# Patient Record
Sex: Female | Born: 1935 | Race: White | Hispanic: No
Health system: Southern US, Community
[De-identification: ages and names within clinical notes are randomized; demographics above are authoritative.]

## PROBLEM LIST (undated history)

## (undated) DIAGNOSIS — I4891 Unspecified atrial fibrillation: Secondary | ICD-10-CM

## (undated) DIAGNOSIS — I059 Rheumatic mitral valve disease, unspecified: Secondary | ICD-10-CM

## (undated) DIAGNOSIS — J309 Allergic rhinitis, unspecified: Secondary | ICD-10-CM

## (undated) DIAGNOSIS — H269 Unspecified cataract: Secondary | ICD-10-CM

## (undated) DIAGNOSIS — F411 Generalized anxiety disorder: Secondary | ICD-10-CM

## (undated) DIAGNOSIS — L719 Rosacea, unspecified: Secondary | ICD-10-CM

## (undated) DIAGNOSIS — G609 Hereditary and idiopathic neuropathy, unspecified: Secondary | ICD-10-CM

## (undated) DIAGNOSIS — H8110 Benign paroxysmal vertigo, unspecified ear: Secondary | ICD-10-CM

## (undated) DIAGNOSIS — M549 Dorsalgia, unspecified: Secondary | ICD-10-CM

## (undated) DIAGNOSIS — M810 Age-related osteoporosis without current pathological fracture: Secondary | ICD-10-CM

## (undated) DIAGNOSIS — F329 Major depressive disorder, single episode, unspecified: Secondary | ICD-10-CM

## (undated) DIAGNOSIS — F3289 Other specified depressive episodes: Secondary | ICD-10-CM

## (undated) DIAGNOSIS — I6529 Occlusion and stenosis of unspecified carotid artery: Secondary | ICD-10-CM

## (undated) DIAGNOSIS — H409 Unspecified glaucoma: Secondary | ICD-10-CM

## (undated) DIAGNOSIS — I10 Essential (primary) hypertension: Secondary | ICD-10-CM

## (undated) DIAGNOSIS — J45909 Unspecified asthma, uncomplicated: Secondary | ICD-10-CM

## (undated) HISTORY — DX: Age-related osteoporosis without current pathological fracture: M81.0

## (undated) HISTORY — DX: Benign paroxysmal vertigo, unspecified ear: H81.10

## (undated) HISTORY — DX: Unspecified cataract: H26.9

## (undated) HISTORY — DX: Unspecified glaucoma: H40.9

## (undated) HISTORY — DX: Rosacea, unspecified: L71.9

## (undated) HISTORY — DX: Unspecified atrial fibrillation: I48.91

## (undated) HISTORY — DX: Occlusion and stenosis of unspecified carotid artery: I65.29

## (undated) HISTORY — DX: Dorsalgia, unspecified: M54.9

## (undated) HISTORY — DX: Rheumatic mitral valve disease, unspecified: I05.9

## (undated) HISTORY — DX: Essential (primary) hypertension: I10

## (undated) HISTORY — PX: CATARACT EXTRACTION, BILATERAL: SHX1313

## (undated) HISTORY — DX: Other specified depressive episodes: F32.89

## (undated) HISTORY — DX: Major depressive disorder, single episode, unspecified: F32.9

## (undated) HISTORY — DX: Hereditary and idiopathic neuropathy, unspecified: G60.9

## (undated) HISTORY — DX: Generalized anxiety disorder: F41.1

## (undated) HISTORY — DX: Hypercalcemia: E83.52

## (undated) HISTORY — PX: HEMORRHOID SURGERY: SHX153

## (undated) HISTORY — DX: Unspecified asthma, uncomplicated: J45.909

## (undated) HISTORY — DX: Allergic rhinitis, unspecified: J30.9

## (undated) HISTORY — PX: OTHER SURGICAL HISTORY: SHX169

---

## 1997-06-02 ENCOUNTER — Emergency Department (HOSPITAL_COMMUNITY): Admission: EM | Admit: 1997-06-02 | Discharge: 1997-06-02 | Payer: Self-pay | Admitting: Emergency Medicine

## 1998-09-17 ENCOUNTER — Other Ambulatory Visit: Admission: RE | Admit: 1998-09-17 | Discharge: 1998-09-17 | Payer: Self-pay | Admitting: Gynecology

## 1999-09-19 ENCOUNTER — Other Ambulatory Visit: Admission: RE | Admit: 1999-09-19 | Discharge: 1999-09-19 | Payer: Self-pay | Admitting: Gynecology

## 1999-09-19 ENCOUNTER — Encounter: Admission: RE | Admit: 1999-09-19 | Discharge: 1999-09-19 | Payer: Self-pay | Admitting: Gynecology

## 1999-09-19 ENCOUNTER — Encounter: Payer: Self-pay | Admitting: Gynecology

## 2000-03-09 ENCOUNTER — Encounter: Payer: Self-pay | Admitting: Ophthalmology

## 2000-03-10 ENCOUNTER — Ambulatory Visit (HOSPITAL_COMMUNITY): Admission: RE | Admit: 2000-03-10 | Discharge: 2000-03-10 | Payer: Self-pay | Admitting: Ophthalmology

## 2000-05-18 ENCOUNTER — Ambulatory Visit (HOSPITAL_BASED_OUTPATIENT_CLINIC_OR_DEPARTMENT_OTHER): Admission: RE | Admit: 2000-05-18 | Discharge: 2000-05-19 | Payer: Self-pay | Admitting: Orthopedic Surgery

## 2000-10-06 ENCOUNTER — Encounter: Admission: RE | Admit: 2000-10-06 | Discharge: 2000-10-06 | Payer: Self-pay | Admitting: Orthopedic Surgery

## 2000-10-06 ENCOUNTER — Encounter: Payer: Self-pay | Admitting: Orthopedic Surgery

## 2000-10-08 ENCOUNTER — Other Ambulatory Visit: Admission: RE | Admit: 2000-10-08 | Discharge: 2000-10-08 | Payer: Self-pay | Admitting: Gynecology

## 2000-10-09 ENCOUNTER — Encounter: Admission: RE | Admit: 2000-10-09 | Discharge: 2000-10-09 | Payer: Self-pay | Admitting: Gynecology

## 2000-10-09 ENCOUNTER — Encounter: Payer: Self-pay | Admitting: Gynecology

## 2000-12-30 ENCOUNTER — Ambulatory Visit (HOSPITAL_COMMUNITY): Admission: RE | Admit: 2000-12-30 | Discharge: 2000-12-30 | Payer: Self-pay | Admitting: Specialist

## 2001-03-22 ENCOUNTER — Inpatient Hospital Stay (HOSPITAL_COMMUNITY): Admission: EM | Admit: 2001-03-22 | Discharge: 2001-03-23 | Payer: Self-pay | Admitting: Emergency Medicine

## 2001-03-22 ENCOUNTER — Encounter: Payer: Self-pay | Admitting: Emergency Medicine

## 2001-03-23 ENCOUNTER — Encounter: Payer: Self-pay | Admitting: Endocrinology

## 2001-11-11 ENCOUNTER — Other Ambulatory Visit: Admission: RE | Admit: 2001-11-11 | Discharge: 2001-11-11 | Payer: Self-pay | Admitting: Gynecology

## 2001-11-11 ENCOUNTER — Encounter: Payer: Self-pay | Admitting: Gynecology

## 2001-11-11 ENCOUNTER — Encounter: Admission: RE | Admit: 2001-11-11 | Discharge: 2001-11-11 | Payer: Self-pay | Admitting: Gynecology

## 2002-08-19 ENCOUNTER — Encounter: Payer: Self-pay | Admitting: Internal Medicine

## 2002-08-19 ENCOUNTER — Ambulatory Visit (HOSPITAL_COMMUNITY): Admission: RE | Admit: 2002-08-19 | Discharge: 2002-08-19 | Payer: Self-pay | Admitting: Internal Medicine

## 2002-11-14 ENCOUNTER — Encounter: Admission: RE | Admit: 2002-11-14 | Discharge: 2002-11-14 | Payer: Self-pay | Admitting: Gynecology

## 2002-11-14 ENCOUNTER — Encounter: Payer: Self-pay | Admitting: Gynecology

## 2003-03-28 ENCOUNTER — Encounter: Admission: RE | Admit: 2003-03-28 | Discharge: 2003-03-28 | Payer: Self-pay | Admitting: Internal Medicine

## 2003-04-27 ENCOUNTER — Encounter: Payer: Self-pay | Admitting: Internal Medicine

## 2003-11-20 ENCOUNTER — Encounter: Admission: RE | Admit: 2003-11-20 | Discharge: 2003-11-20 | Payer: Self-pay | Admitting: Gynecology

## 2003-11-21 ENCOUNTER — Other Ambulatory Visit: Admission: RE | Admit: 2003-11-21 | Discharge: 2003-11-21 | Payer: Self-pay | Admitting: Gynecology

## 2004-04-11 ENCOUNTER — Ambulatory Visit: Payer: Self-pay | Admitting: Internal Medicine

## 2004-08-15 ENCOUNTER — Ambulatory Visit: Payer: Self-pay | Admitting: Internal Medicine

## 2004-11-25 ENCOUNTER — Encounter: Admission: RE | Admit: 2004-11-25 | Discharge: 2004-11-25 | Payer: Self-pay | Admitting: Gynecology

## 2004-12-20 ENCOUNTER — Ambulatory Visit: Payer: Self-pay | Admitting: Internal Medicine

## 2005-03-11 ENCOUNTER — Ambulatory Visit: Payer: Self-pay | Admitting: Internal Medicine

## 2005-03-18 ENCOUNTER — Ambulatory Visit: Payer: Self-pay | Admitting: Internal Medicine

## 2005-05-07 ENCOUNTER — Ambulatory Visit: Payer: Self-pay | Admitting: Internal Medicine

## 2005-08-12 ENCOUNTER — Ambulatory Visit: Payer: Self-pay | Admitting: Internal Medicine

## 2005-09-18 ENCOUNTER — Ambulatory Visit: Payer: Self-pay | Admitting: Internal Medicine

## 2005-12-16 ENCOUNTER — Ambulatory Visit: Payer: Self-pay | Admitting: Internal Medicine

## 2005-12-17 ENCOUNTER — Encounter: Admission: RE | Admit: 2005-12-17 | Discharge: 2005-12-17 | Payer: Self-pay | Admitting: Gynecology

## 2005-12-17 ENCOUNTER — Other Ambulatory Visit: Admission: RE | Admit: 2005-12-17 | Discharge: 2005-12-17 | Payer: Self-pay | Admitting: Gynecology

## 2005-12-30 ENCOUNTER — Ambulatory Visit: Payer: Self-pay | Admitting: Internal Medicine

## 2006-01-19 ENCOUNTER — Ambulatory Visit: Payer: Self-pay

## 2006-01-19 ENCOUNTER — Encounter: Payer: Self-pay | Admitting: Cardiology

## 2006-03-04 ENCOUNTER — Ambulatory Visit: Payer: Self-pay | Admitting: Internal Medicine

## 2006-03-04 LAB — CONVERTED CEMR LAB
Basophils Relative: 1.1 % — ABNORMAL HIGH (ref 0.0–1.0)
Calcium: 10.2 mg/dL (ref 8.4–10.5)
Chloride: 102 meq/L (ref 96–112)
Cholesterol: 163 mg/dL (ref 0–200)
Glucose, Bld: 96 mg/dL (ref 70–99)
LDL Cholesterol: 60 mg/dL (ref 0–99)
MCHC: 33.3 g/dL (ref 30.0–36.0)
MCV: 96.7 fL (ref 78.0–100.0)
Monocytes Absolute: 0.4 10*3/uL (ref 0.2–0.7)
Monocytes Relative: 10.1 % (ref 3.0–11.0)
Neutro Abs: 2 10*3/uL (ref 1.4–7.7)
Neutrophils Relative %: 55.8 % (ref 43.0–77.0)
Platelets: 180 10*3/uL (ref 150–400)

## 2006-10-15 ENCOUNTER — Encounter: Payer: Self-pay | Admitting: Internal Medicine

## 2006-10-15 DIAGNOSIS — H409 Unspecified glaucoma: Secondary | ICD-10-CM

## 2006-10-15 DIAGNOSIS — H269 Unspecified cataract: Secondary | ICD-10-CM

## 2006-10-15 DIAGNOSIS — I059 Rheumatic mitral valve disease, unspecified: Secondary | ICD-10-CM

## 2006-10-15 DIAGNOSIS — J45909 Unspecified asthma, uncomplicated: Secondary | ICD-10-CM

## 2006-10-15 DIAGNOSIS — L719 Rosacea, unspecified: Secondary | ICD-10-CM

## 2006-10-15 DIAGNOSIS — M810 Age-related osteoporosis without current pathological fracture: Secondary | ICD-10-CM

## 2006-10-18 DIAGNOSIS — F329 Major depressive disorder, single episode, unspecified: Secondary | ICD-10-CM

## 2006-10-18 DIAGNOSIS — F411 Generalized anxiety disorder: Secondary | ICD-10-CM

## 2006-10-18 DIAGNOSIS — E785 Hyperlipidemia, unspecified: Secondary | ICD-10-CM

## 2006-12-16 ENCOUNTER — Ambulatory Visit: Payer: Self-pay | Admitting: Internal Medicine

## 2006-12-22 ENCOUNTER — Encounter: Admission: RE | Admit: 2006-12-22 | Discharge: 2006-12-22 | Payer: Self-pay | Admitting: Gynecology

## 2007-02-24 ENCOUNTER — Emergency Department (HOSPITAL_COMMUNITY): Admission: EM | Admit: 2007-02-24 | Discharge: 2007-02-24 | Payer: Self-pay | Admitting: Emergency Medicine

## 2007-06-01 ENCOUNTER — Ambulatory Visit: Payer: Self-pay | Admitting: Internal Medicine

## 2007-06-01 DIAGNOSIS — R350 Frequency of micturition: Secondary | ICD-10-CM

## 2007-06-01 DIAGNOSIS — M792 Neuralgia and neuritis, unspecified: Secondary | ICD-10-CM

## 2007-06-01 DIAGNOSIS — M25519 Pain in unspecified shoulder: Secondary | ICD-10-CM | POA: Insufficient documentation

## 2007-06-01 DIAGNOSIS — R5383 Other fatigue: Secondary | ICD-10-CM

## 2007-06-01 DIAGNOSIS — R5381 Other malaise: Secondary | ICD-10-CM | POA: Insufficient documentation

## 2007-06-01 DIAGNOSIS — J309 Allergic rhinitis, unspecified: Secondary | ICD-10-CM | POA: Insufficient documentation

## 2007-06-02 LAB — CONVERTED CEMR LAB
Albumin: 3.9 g/dL (ref 3.5–5.2)
Alkaline Phosphatase: 58 units/L (ref 39–117)
Bilirubin Urine: NEGATIVE
CO2: 31 meq/L (ref 19–32)
Calcium: 10.4 mg/dL (ref 8.4–10.5)
Eosinophils Relative: 4 % (ref 0.0–5.0)
GFR calc non Af Amer: 86 mL/min
HDL: 89.4 mg/dL (ref >39.0)
Ketones, ur: NEGATIVE mg/dL
LDL Cholesterol: 77 mg/dL (ref 0–99)
MCHC: 32.5 g/dL (ref 30.0–36.0)
MCV: 96.6 fL (ref 78.0–100.0)
Monocytes Relative: 10 % (ref 3.0–12.0)
Mucus, UA: NEGATIVE
RBC: 3.96 M/uL (ref 3.87–5.11)
RDW: 13.1 % (ref 11.5–14.6)
Sodium: 141 meq/L (ref 135–145)
Total Bilirubin: 0.6 mg/dL (ref 0.3–1.2)
Total CHOL/HDL Ratio: 2
Total Protein, Urine: NEGATIVE mg/dL
Total Protein: 7.5 g/dL (ref 6.0–8.3)
Triglycerides: 73 mg/dL (ref 0–149)
Urine Glucose: NEGATIVE mg/dL
VLDL: 15 mg/dL (ref 0–40)
pH: 6.5 (ref 5.0–8.0)

## 2007-06-10 ENCOUNTER — Telehealth: Payer: Self-pay | Admitting: Internal Medicine

## 2007-06-14 ENCOUNTER — Telehealth (INDEPENDENT_AMBULATORY_CARE_PROVIDER_SITE_OTHER): Payer: Self-pay | Admitting: *Deleted

## 2007-07-08 ENCOUNTER — Encounter: Payer: Self-pay | Admitting: Internal Medicine

## 2007-09-03 ENCOUNTER — Encounter: Admission: RE | Admit: 2007-09-03 | Discharge: 2007-09-03 | Payer: Self-pay | Admitting: Specialist

## 2007-12-06 ENCOUNTER — Ambulatory Visit: Payer: Self-pay | Admitting: Internal Medicine

## 2007-12-27 ENCOUNTER — Encounter: Payer: Self-pay | Admitting: Internal Medicine

## 2007-12-28 ENCOUNTER — Encounter: Admission: RE | Admit: 2007-12-28 | Discharge: 2007-12-28 | Payer: Self-pay | Admitting: Gynecology

## 2008-03-15 ENCOUNTER — Encounter: Payer: Self-pay | Admitting: Internal Medicine

## 2008-04-03 ENCOUNTER — Ambulatory Visit: Payer: Self-pay | Admitting: Internal Medicine

## 2008-04-03 DIAGNOSIS — J069 Acute upper respiratory infection, unspecified: Secondary | ICD-10-CM | POA: Insufficient documentation

## 2008-04-12 ENCOUNTER — Encounter: Payer: Self-pay | Admitting: Internal Medicine

## 2008-04-20 ENCOUNTER — Telehealth: Payer: Self-pay | Admitting: Internal Medicine

## 2008-05-03 ENCOUNTER — Encounter: Admission: RE | Admit: 2008-05-03 | Discharge: 2008-05-03 | Payer: Self-pay | Admitting: Urology

## 2008-05-08 ENCOUNTER — Ambulatory Visit (HOSPITAL_BASED_OUTPATIENT_CLINIC_OR_DEPARTMENT_OTHER): Admission: RE | Admit: 2008-05-08 | Discharge: 2008-05-08 | Payer: Self-pay | Admitting: Urology

## 2008-05-15 ENCOUNTER — Encounter: Payer: Self-pay | Admitting: Internal Medicine

## 2008-06-07 ENCOUNTER — Encounter: Payer: Self-pay | Admitting: Internal Medicine

## 2008-09-06 DIAGNOSIS — K644 Residual hemorrhoidal skin tags: Secondary | ICD-10-CM | POA: Insufficient documentation

## 2008-09-12 ENCOUNTER — Ambulatory Visit: Payer: Self-pay | Admitting: Internal Medicine

## 2008-09-19 ENCOUNTER — Ambulatory Visit: Payer: Self-pay | Admitting: Internal Medicine

## 2008-09-26 ENCOUNTER — Telehealth: Payer: Self-pay | Admitting: Internal Medicine

## 2008-11-09 ENCOUNTER — Ambulatory Visit: Payer: Self-pay | Admitting: Internal Medicine

## 2008-11-09 DIAGNOSIS — H8110 Benign paroxysmal vertigo, unspecified ear: Secondary | ICD-10-CM

## 2008-11-09 DIAGNOSIS — M549 Dorsalgia, unspecified: Secondary | ICD-10-CM | POA: Insufficient documentation

## 2008-11-10 ENCOUNTER — Ambulatory Visit: Payer: Self-pay | Admitting: Internal Medicine

## 2008-11-10 LAB — CONVERTED CEMR LAB
Alkaline Phosphatase: 52 units/L (ref 39–117)
BUN: 14 mg/dL (ref 6–23)
Basophils Relative: 0.9 % (ref 0.0–3.0)
Bilirubin, Direct: 0 mg/dL (ref 0.0–0.3)
Cholesterol: 180 mg/dL (ref 0–200)
Creatinine, Ser: 0.8 mg/dL (ref 0.4–1.2)
Eosinophils Relative: 2.4 % (ref 0.0–5.0)
GFR calc non Af Amer: 73.23 mL/min (ref >60)
HDL: 82.2 mg/dL (ref >39.00)
Hemoglobin, Urine: NEGATIVE
LDL Cholesterol: 81 mg/dL (ref 0–99)
Leukocytes, UA: NEGATIVE
Lymphs Abs: 1 10*3/uL (ref 0.7–4.0)
MCV: 97.4 fL (ref 78.0–100.0)
Neutro Abs: 2 10*3/uL (ref 1.4–7.7)
Potassium: 3.9 meq/L (ref 3.5–5.1)
TSH: 0.28 microintl units/mL — ABNORMAL LOW (ref 0.35–5.50)
Total CHOL/HDL Ratio: 2
Triglycerides: 84 mg/dL (ref 0.0–149.0)
VLDL: 16.8 mg/dL (ref 0.0–40.0)
WBC: 3.5 10*3/uL — ABNORMAL LOW (ref 4.5–10.5)

## 2008-12-05 ENCOUNTER — Ambulatory Visit: Payer: Self-pay | Admitting: Internal Medicine

## 2008-12-28 ENCOUNTER — Encounter: Payer: Self-pay | Admitting: Internal Medicine

## 2008-12-29 ENCOUNTER — Encounter: Admission: RE | Admit: 2008-12-29 | Discharge: 2008-12-29 | Payer: Self-pay | Admitting: Gynecology

## 2008-12-29 LAB — HM MAMMOGRAPHY: HM Mammogram: NEGATIVE

## 2009-02-01 ENCOUNTER — Telehealth: Payer: Self-pay | Admitting: Internal Medicine

## 2009-02-07 ENCOUNTER — Encounter: Payer: Self-pay | Admitting: Internal Medicine

## 2009-12-31 ENCOUNTER — Encounter: Admission: RE | Admit: 2009-12-31 | Discharge: 2009-12-31 | Payer: Self-pay | Admitting: Gynecology

## 2010-01-01 ENCOUNTER — Encounter: Payer: Self-pay | Admitting: Internal Medicine

## 2010-01-29 ENCOUNTER — Encounter: Payer: Self-pay | Admitting: Internal Medicine

## 2010-01-29 ENCOUNTER — Ambulatory Visit: Payer: Self-pay | Admitting: Internal Medicine

## 2010-01-29 LAB — CONVERTED CEMR LAB
Basophils Absolute: 0 10*3/uL (ref 0.0–0.1)
Basophils Relative: 1.2 % (ref 0.0–3.0)
Bilirubin Urine: NEGATIVE
Bilirubin, Direct: 0.1 mg/dL (ref 0.0–0.3)
CO2: 32 meq/L (ref 19–32)
Calcium: 9.9 mg/dL (ref 8.4–10.5)
Creatinine, Ser: 0.7 mg/dL (ref 0.4–1.2)
Eosinophils Absolute: 0.1 10*3/uL (ref 0.0–0.7)
Eosinophils Relative: 3.1 % (ref 0.0–5.0)
GFR calc non Af Amer: 83.78 mL/min (ref >60.00)
HCT: 33.9 % — ABNORMAL LOW (ref 36.0–46.0)
HDL: 94.5 mg/dL (ref >39.00)
Hemoglobin, Urine: NEGATIVE
Hemoglobin: 11.5 g/dL — ABNORMAL LOW (ref 12.0–15.0)
LDL Cholesterol: 73 mg/dL (ref 0–99)
Lymphs Abs: 1.2 10*3/uL (ref 0.7–4.0)
MCHC: 34.1 g/dL (ref 30.0–36.0)
Neutro Abs: 1.7 10*3/uL (ref 1.4–7.7)
Neutrophils Relative %: 48.9 % (ref 43.0–77.0)
RDW: 13.9 % (ref 11.5–14.6)
Specific Gravity, Urine: 1.015 (ref 1.000–1.030)
TSH: 0.55 microintl units/mL (ref 0.35–5.50)
Total CHOL/HDL Ratio: 2
Total Protein, Urine: NEGATIVE mg/dL
WBC: 3.4 10*3/uL — ABNORMAL LOW (ref 4.5–10.5)
pH: 7 (ref 5.0–8.0)

## 2010-03-28 NOTE — Assessment & Plan Note (Signed)
Summary: PER PT OV--D/T---STC   Vital Signs:  Patient profile:   77 year old female Height:      63.5 inches Weight:      123.50 pounds BMI:     21.61 O2 Sat:      98 % on Room air Temp:     98.7 degrees F oral Pulse rate:   62 / minute BP sitting:   128 / 62  (left arm) Cuff size:   regular  Vitals Entered By: Zella Ball Ewing CMA Duncan Dull) (January 29, 2010 3:26 PM)  O2 Flow:  Room air  Preventive Care Screening  Colonoscopy:    Date:  09/07/2009    Next Due:  08/2014    Results:  normal   CC: followup/RE   Primary Care Provider:  Oliver Barre, MD  CC:  followup/RE.  History of Present Illness: here for f/u; overall doing well;  Pt denies CP, worsening sob, doe, wheezing, orthopnea, pnd, worsening LE edema, palps, dizziness or syncope  Pt denies new neuro symptoms such as headache, facial or extremity weakness .  Pt denies polydipsia, polyuria.   Overall good compliance with meds, trying to follow low chol  diet, wt stable, little excercise however .  No fever, wt loss, night sweats, loss of appetite or other constitutional symptoms  Denies worsening depressive symptoms, suicidal ideation, or panic, though has some midl anxiety.  Overall good compliance with meds, and good tolerability.  No other new compalints except for mild ongoing fatigue, without OSA symtpoms.    Preventive Screening-Counseling & Management      Drug Use:  no.    Problems Prior to Update: 1)  Hypercalcemia  (ICD-275.42) 2)  Back Pain  (ICD-724.5) 3)  Fatigue  (ICD-780.79) 4)  Positional Vertigo  (ICD-386.11) 5)  External Hemorrhoids  (ICD-455.3) 6)  Fh of Colon Cancer  (ICD-153.9) 7)  Upper Respiratory Infection, Acute  (ICD-465.9) 8)  Peripheral Neuropathy  (ICD-356.9) 9)  Allergic Rhinitis  (ICD-477.9) 10)  Fatigue  (ICD-780.79) 11)  Frequency, Urinary  (ICD-788.41) 12)  Shoulder Pain, Bilateral  (ICD-719.41) 13)  Glaucoma Nos  (ICD-365.9) 14)  Hyperlipidemia  (ICD-272.4) 15)  Cataract Nos   (ICD-366.9) 16)  Rosacea  (ICD-695.3) 17)  Depression  (ICD-311) 18)  Anxiety  (ICD-300.00) 19)  Mitral Valve Prolapse  (ICD-424.0) 20)  Osteoporosis  (ICD-733.00) 21)  Asthma  (ICD-493.90)  Medications Prior to Update: 1)  Actonel 150 Mg Tabs (Risedronate Sodium) .Marland Kitchen.. 1po Q Month 2)  Aspirin Adult Low Strength 81 Mg Tbec (Aspirin) .... Take 1 By Mouth Once Daily 3)  Lumigan 0.03 % Soln (Bimatoprost) .Marland Kitchen.. 1 Drop in Each Eye Once Daily 4)  Minocycline Hcl 50 Mg Tabs (Minocycline Hcl) .Marland Kitchen.. 1 Tablet By Mouth Once Daily As Needed 5)  Alprazolam 0.5 Mg Tabs (Alprazolam) .... 1/2 - 1 Tablet By Mouth As Needed For Sleep 6)  Zostavax 09811 Unt/0.23ml Solr (Zoster Vaccine Live) .... Use Asd Im X 1  Current Medications (verified): 1)  Actonel 150 Mg Tabs (Risedronate Sodium) .Marland Kitchen.. 1po Q Month 2)  Aspirin Adult Low Strength 81 Mg Tbec (Aspirin) .... Take 1 By Mouth Once Daily 3)  Lumigan 0.03 % Soln (Bimatoprost) .Marland Kitchen.. 1 Drop in Each Eye Once Daily 4)  Minocycline Hcl 50 Mg Tabs (Minocycline Hcl) .Marland Kitchen.. 1 Tablet By Mouth Once Daily As Needed 5)  Alprazolam 0.5 Mg Tabs (Alprazolam) .... 1/2 - 1 Tablet By Mouth As Needed For Sleep 6)  Zostavax 91478 Unt/0.87ml Solr (Zoster Vaccine  Live) .... Use Asd Im X 1 7)  Alphagan P 0.15 % Soln (Brimonidine Tartrate) .Marland Kitchen.. 1 Drop Every Night  Allergies (verified): No Known Drug Allergies  Past History:  Past Surgical History: Last updated: 10/15/2006 Cataract extraction- bilateral posterior chamber interocular lens implant Hemorrhoidectomy L Ankle Surgery  Social History: Last updated: 01/29/2010 Married no children Never Smoked Alcohol use-no retired [employer] Drug use-no  Risk Factors: Smoking Status: never (06/01/2007)  Past Medical History: Asthma Mitral Valve Prolapse Rosacea allergic conjunctivitis Osteoporosis Anxiety Depression Hx of Cataracts Hyperlipidemia Glaucoma Allergic rhinitis symptomatic PVC's Peripheral  neuropathy Kidney Stones  Social History: Married no children Never Smoked Alcohol use-no retired [employer] Drug use-no Drug Use:  no  Review of Systems       all otherwise negative per pt -    Physical Exam  General:  alert and well-developed.   Head:  normocephalic and atraumatic.   Eyes:  vision grossly intact, pupils equal, and pupils round.   Ears:  R ear normal and L ear normal.   Nose:  nasal dischargemucosal pallor and mucosal erythema.   Mouth:  pharyngeal erythema and fair dentition.   Neck:  supple and no masses.   Lungs:  normal respiratory effort and normal breath sounds.   Heart:  normal rate and regular rhythm.   Abdomen:  soft, non-tender, and normal bowel sounds.   Msk:  no joint tenderness and no joint swelling.   Extremities:  no edema, no erythema  Neurologic:  strength normal in all extremities and gait normal.   Skin:  no rashes and no suspicious lesions.   Psych:  slightly anxious.     Impression & Recommendations:  Problem # 1:  FATIGUE (ICD-780.79) exam benign, to check labs below; follow with expectant management  Orders: TLB-BMP (Basic Metabolic Panel-BMET) (80048-METABOL) TLB-CBC Platelet - w/Differential (85025-CBCD) TLB-Hepatic/Liver Function Pnl (80076-HEPATIC) TLB-TSH (Thyroid Stimulating Hormone) (84443-TSH)  Problem # 2:  HYPERLIPIDEMIA (ICD-272.4)  Orders: TLB-Lipid Panel (80061-LIPID)  Labs Reviewed: SGOT: 37 (11/09/2008)   SGPT: 26 (11/09/2008)   HDL:82.20 (11/09/2008), 89.4 (06/01/2007)  LDL:81 (11/09/2008), 77 (06/01/2007)  Chol:180 (11/09/2008), 181 (06/01/2007)  Trig:84.0 (11/09/2008), 73 (06/01/2007) stable overall by hx and exam, ok to continue meds/tx as is  , Pt to continue diet efforts, good med tolerance; to check labs - goal LDL less than 100  Problem # 3:  OSTEOPOROSIS (ICD-733.00)  Her updated medication list for this problem includes:    Actonel 150 Mg Tabs (Risedronate sodium) .Marland Kitchen... 1po q month stable  overall by hx and exam, ok to continue meds/tx as is - Continue all previous medications as before this visit , last dxa per GYN stable per pt  Problem # 4:  ANXIETY (ICD-300.00)  Her updated medication list for this problem includes:    Alprazolam 0.5 Mg Tabs (Alprazolam) .Marland Kitchen... 1/2 - 1 tablet by mouth as needed for sleep stable overall by hx and exam, ok to continue meds/tx as is   Complete Medication List: 1)  Actonel 150 Mg Tabs (Risedronate sodium) .Marland Kitchen.. 1po q month 2)  Aspirin Adult Low Strength 81 Mg Tbec (Aspirin) .... Take 1 by mouth once daily 3)  Lumigan 0.03 % Soln (Bimatoprost) .Marland Kitchen.. 1 drop in each eye once daily 4)  Minocycline Hcl 50 Mg Tabs (Minocycline hcl) .Marland Kitchen.. 1 tablet by mouth once daily as needed 5)  Alprazolam 0.5 Mg Tabs (Alprazolam) .... 1/2 - 1 tablet by mouth as needed for sleep 6)  Zostavax 16109 Unt/0.74ml Solr (Zoster  vaccine live) .... Use asd im x 1 7)  Alphagan P 0.15 % Soln (Brimonidine tartrate) .Marland Kitchen.. 1 drop every night  Other Orders: EKG w/ Interpretation (93000) Tdap => 77yrs IM (56213) Admin 1st Vaccine (08657) TLB-Udip ONLY (81003-UDIP)  Patient Instructions: 1)  you had the tetanus shot today 2)  Please go to the Lab in the basement for your blood and/or urine tests today 3)  Please call the number on the [facility] Card for results of your testing  4)  Continue all previous medications as before this visit 5)  Please schedule a follow-up appointment in 1 year, or sooner if needed Prescriptions: ACTONEL 150 MG TABS (RISEDRONATE SODIUM) 1po q month  #3 x 3   Entered and Authorized by:   Corwin Levins MD   Signed by:   Corwin Levins MD on 01/29/2010   Method used:   Print then Give to Patient   RxID:   [id number] ALPRAZOLAM 0.5 MG TABS (ALPRAZOLAM) 1/2 - 1 tablet by mouth as needed for sleep  #30 x 1   Entered and Authorized by:   Corwin Levins MD   Signed by:   Corwin Levins MD on 01/29/2010   Method used:   Print then Give to Patient   RxID:    [id number]    Orders Added: 1)  EKG w/ Interpretation [93000] 2)  Tdap => 13yrs IM [90715] 3)  Admin 1st Vaccine [90471] 4)  TLB-BMP (Basic Metabolic Panel-BMET) [80048-METABOL] 5)  TLB-CBC Platelet - w/Differential [85025-CBCD] 6)  TLB-Hepatic/Liver Function Pnl [80076-HEPATIC] 7)  TLB-TSH (Thyroid Stimulating Hormone) [84443-TSH] 8)  TLB-Lipid Panel [80061-LIPID] 9)  TLB-Udip ONLY [81003-UDIP] 10)  Est. Patient Level IV [42595]   Immunizations Administered:  Tetanus Vaccine:    Vaccine Type: Tdap    Site: right deltoid    Mfr: GlaxoSmithKline    Dose: 0.5 ml    Route: IM    Given by: Zella Ball Ewing CMA (AAMA)    Exp. Date: 12/14/2011    Lot #: [id number]    VIS given: 01/12/08 version given January 29, 2010.   Immunizations Administered:  Tetanus Vaccine:    Vaccine Type: Tdap    Site: right deltoid    Mfr: GlaxoSmithKline    Dose: 0.5 ml    Route: IM    Given by: Zella Ball Ewing CMA (AAMA)    Exp. Date: 12/14/2011    Lot #: [id number]    VIS given: 01/12/08 version given January 29, 2010.

## 2010-03-28 NOTE — Letter (Signed)
Summary: Beather Arbour MD  Beather Arbour MD   Imported By: Sherian Rein 01/07/2010 07:48:24  _____________________________________________________________________  External Attachment:    Type:   Image     Comment:   External Document

## 2010-06-06 LAB — BASIC METABOLIC PANEL
CO2: 27 mEq/L (ref 19–32)
Creatinine, Ser: 0.65 mg/dL (ref 0.4–1.2)
GFR calc Af Amer: >60 mL/min (ref >60)
GFR calc non Af Amer: >60 mL/min (ref >60)

## 2010-06-29 ENCOUNTER — Emergency Department (HOSPITAL_COMMUNITY): Payer: Medicare Other

## 2010-06-29 ENCOUNTER — Emergency Department (HOSPITAL_COMMUNITY)
Admission: EM | Admit: 2010-06-29 | Discharge: 2010-06-29 | Disposition: A | Discharge status: Home / Self Care | Payer: Medicare Other | Attending: Emergency Medicine | Admitting: Emergency Medicine

## 2010-06-29 DIAGNOSIS — H409 Unspecified glaucoma: Secondary | ICD-10-CM | POA: Insufficient documentation

## 2010-06-29 DIAGNOSIS — W010XXA Fall on same level from slipping, tripping and stumbling without subsequent striking against object, initial encounter: Secondary | ICD-10-CM | POA: Insufficient documentation

## 2010-06-29 DIAGNOSIS — J45909 Unspecified asthma, uncomplicated: Secondary | ICD-10-CM | POA: Insufficient documentation

## 2010-06-29 DIAGNOSIS — S42209A Unspecified fracture of upper end of unspecified humerus, initial encounter for closed fracture: Secondary | ICD-10-CM | POA: Insufficient documentation

## 2010-06-29 DIAGNOSIS — M25519 Pain in unspecified shoulder: Secondary | ICD-10-CM | POA: Insufficient documentation

## 2010-07-09 NOTE — Assessment & Plan Note (Signed)
[facility]                             PULMONARY OFFICE NOTE   Ann Booth, Ann Booth                     MRN:          [id number]  DATE:12/16/2006                            DOB:          May 09, 1926    PROBLEM:  1. Asthma.  2. Allergic conjunctivitis.  3. Rosacea.  4. Mitral valve prolapse.  5. Dry eyes/glaucoma.   HISTORY:  She has been off allergy vaccine for the past year as agreed.  Still feels comfortable.  She is please that she can still sweep the  floor without being bothered by house dust.  That had been a marker of  trouble for her when allergy vaccine was first begun.  Glaucoma drops  make her eyes red.  She does have some burning and dryness, and is  working with her eye doctors.  I suggested she ask permission to try  Lacri-Lube ointment.   She continues on:  1. Glaucoma eye drops.  2. Aspirin 81 mg.  3. Xanax p.r.n.  4. Vitamins.   NO MEDICATION ALLERGY.   OBJECTIVE:  Weight 128 pounds.  BP 124/64.  Pulse 58.  Room air  saturation 100%.  Quiet clear chest.  Normal heart sounds.  Conjunctivae are injected.   IMPRESSION:  Asthma and rhinitis are well controlled.  I think her  conjunctival injection is more of a reflection of her eye medications  than of allergic conjunctivitis.   PLAN:  She was given the name of Lacri-Lube ointment so she can speak to  her doctors about it.  Schedule return in 1 year, earlier p.r.n.  Continue routine environmental precautions against heavy exposure to  agents which have caused allergy symptoms in the past.     Clinton D. Maple Hudson, MD, Tonny Bollman, FACP  Electronically Signed    CDY/MedQ  DD: 12/16/2006  DT: 12/17/2006  Job #: [id number]

## 2010-07-09 NOTE — Op Note (Signed)
[name], Ann Booth NO.:  [id number]   MEDICAL RECORD NO.:  [id number]          PATIENT TYPE:  AMB   LOCATION:  [facility]                         FACILITY:  [facility]   PHYSICIAN:  Heloise Purpura, MD      DATE OF BIRTH:  1927/01/04   DATE OF PROCEDURE:  05/08/2008  DATE OF DISCHARGE:                               OPERATIVE REPORT   PREOPERATIVE DIAGNOSES:  1. Left renal calculi.  2. Recurrent urinary tract infections.   POSTOPERATIVE DIAGNOSES:  1. Left renal calculi.  2. Recurrent urinary tract infections.   PROCEDURE:  1. Cystoscopy.  2. Left retrograde pyelography.  3. Left ureteroscopy with laser lithotripsy.  4. Left ureteral stent placement (6 x 24).   SURGEON:  Heloise Purpura, M.D.   ANESTHESIA:  LMA.   INDICATION:  [name] is an 77 year old female with recurrent  Klebsiella urinary tract infections despite appropriate antibiotic  therapy.  She underwent an evaluation for persistent bacteriuria and was  found to have 2 large left renal calculi including what appeared to be  obstruction of her upper pole collecting system.  After discussion  regarding management options for treatment, she elected to proceed with  surgical therapy and the above procedures.  The potential risks,  complications, and alternative treatment options were discussed in  detail and informed consent was obtained.   DESCRIPTION OF PROCEDURE:  The patient was taken to the operating room  and anesthesia was induced.  She was given preoperative antibiotics,  placed in the dorsal lithotomy position, and prepped and draped in the  usual sterile fashion.  Next, a preoperative time-out was performed.  Cystourethroscopy was performed which demonstrated a normal urethra.  The bladder was systematically examined and there was no evidence for  any tumors, stones, or other mucosal pathology.  The ureteral orifices  were in the normal anatomic position and effluxing clear urine.  The  left  ureteral orifice was then identified and intubated with a 5-French  ureteral catheter.  Omnipaque contrast was injected which demonstrated a  normal caliber ureter without evidence of filling defects or  intraureteral calculi.  A 0.038 sensor guidewire was then advanced up  into the left renal pelvis under fluoroscopic guidance.  A 12/14 access  sheath was advanced over the wire into the proximal ureter.  The digital  flexible ureteroscope was then advanced up into the proximal ureter and  into the renal pelvis.  The renal collecting system was examined under  direct vision.  The patient was noted to have a lower pole renal  calculus as seen on her CT scan.  In addition, she was noted to have a  large upper pole calculus obstructing an upper pole infundibulum as  suspected on her CT scan.  The 200-micron holmium laser fiber was then  placed through the scope and laser lithotripsy was performed until all  stones were fragmented adequately and would be expected to pass  spontaneously.  Examination of the collecting system revealed no  remaining sizable fragments.  Therefore, a 0.038 sensor guidewire was  advanced back up the ureter into the renal pelvis  and this wire was back-  loaded on the cystoscope sheath.  A 6 x 24 double-J ureteral stent was  then advanced over the wire using Seldinger technique.  The wire was  removed with a good curl noted in the renal pelvis as well as in the  bladder.  The patient's bladder was then emptied and the procedure was  ended.  She tolerated the procedure well and without complications.  She  was able to be awakened and transferred to the recovery unit in  satisfactory condition.      Heloise Purpura, MD  Electronically Signed     LB/MEDQ  D:  05/08/2008  T:  05/08/2008  Job:  [id number]

## 2010-07-11 ENCOUNTER — Emergency Department (HOSPITAL_COMMUNITY): Payer: Medicare Other

## 2010-07-11 ENCOUNTER — Encounter (HOSPITAL_COMMUNITY): Payer: Self-pay | Admitting: Radiology

## 2010-07-11 ENCOUNTER — Emergency Department (HOSPITAL_COMMUNITY)
Admission: EM | Admit: 2010-07-11 | Discharge: 2010-07-11 | Disposition: A | Discharge status: Home / Self Care | Payer: Medicare Other | Attending: Emergency Medicine | Admitting: Emergency Medicine

## 2010-07-11 DIAGNOSIS — J45909 Unspecified asthma, uncomplicated: Secondary | ICD-10-CM | POA: Insufficient documentation

## 2010-07-11 DIAGNOSIS — Z79899 Other long term (current) drug therapy: Secondary | ICD-10-CM | POA: Insufficient documentation

## 2010-07-11 DIAGNOSIS — R071 Chest pain on breathing: Secondary | ICD-10-CM | POA: Insufficient documentation

## 2010-07-11 DIAGNOSIS — R0989 Other specified symptoms and signs involving the circulatory and respiratory systems: Secondary | ICD-10-CM | POA: Insufficient documentation

## 2010-07-11 DIAGNOSIS — R0609 Other forms of dyspnea: Secondary | ICD-10-CM | POA: Insufficient documentation

## 2010-07-11 DIAGNOSIS — H409 Unspecified glaucoma: Secondary | ICD-10-CM | POA: Insufficient documentation

## 2010-07-11 DIAGNOSIS — R0602 Shortness of breath: Secondary | ICD-10-CM | POA: Insufficient documentation

## 2010-07-11 LAB — CBC
MCV: 94.5 fL (ref 78.0–100.0)
Platelets: 263 10*3/uL (ref 150–400)
RBC: 3.48 MIL/uL — ABNORMAL LOW (ref 3.87–5.11)
RDW: 14.2 % (ref 11.5–15.5)

## 2010-07-11 LAB — BASIC METABOLIC PANEL
Calcium: 11.1 mg/dL — ABNORMAL HIGH (ref 8.4–10.5)
Chloride: 102 mEq/L (ref 96–112)
GFR calc Af Amer: >60 mL/min (ref >60)
GFR calc non Af Amer: >60 mL/min (ref >60)

## 2010-07-11 LAB — DIFFERENTIAL
Eosinophils Relative: 3 % (ref 0–5)
Lymphocytes Relative: 19 % (ref 12–46)
Lymphs Abs: 0.7 10*3/uL (ref 0.7–4.0)
Monocytes Relative: 9 % (ref 3–12)
Neutro Abs: 2.6 10*3/uL (ref 1.7–7.7)

## 2010-07-11 LAB — POCT CARDIAC MARKERS
Myoglobin, poc: 93.3 ng/mL (ref 12–200)
Troponin i, poc: <0.05 ng/mL (ref 0.00–0.09)

## 2010-07-11 MED ORDER — IOHEXOL 300 MG/ML  SOLN
90.0000 mL | Freq: Once | INTRAMUSCULAR | Status: AC | PRN
  Administered 2010-07-11: 90 mL via INTRAVENOUS

## 2010-07-12 NOTE — Op Note (Signed)
. [facility]  Patient:    Ann Booth, Ann Booth                     MRN: [id number] Proc. Date: 03/10/00 Adm. Date:  [id number] Attending:  Ivor Messier CC:         Corwin Levins, M.D. [facility]   Operative Report  PREOPERATIVE DIAGNOSIS:  Senile cataract, right eye.  POSTOPERATIVE DIAGNOSIS:  Senile cataract, right eye.  PROCEDURE:  Planned extracapsular cataract extraction - phacoemulsification, primary insertion of posterior chamber interocular lens implant.  SURGEON:  Guadelupe Sabin, M.D.  ASSISTANT:  Nurse.  ANESTHESIA:  Local 4% Xylocaine, 0.75% Marcaine retrobulbar block, topical Tetracaine, interocular Xylocaine.  Anesthesia standby required in this 77 year old patient.  The patient was given sodium Pentothal intravenously during the period of retrobulbar injection.  DESCRIPTION OF PROCEDURE:  After the patient was prepped and draped, a speculum was inserted in the right eye.  The eye was turned downward and a superior rectus traction suture placed.  Schiotz tonometry was recorded within normal limits scale unit 6 to 7.  A superior rectus traction suture was placed.  A peritomy was performed adjacent to the limbus from the 11 to 1 oclock position.  A corneoscleral junction was cleaned and a corneoscleral groove made with a 45 degree Superblade.  The anterior chamber was then entered with a diamond 2.5 mm Keratome at the 12 oclock position and the 15 degree blade at the 2:30 position.  Using a bent 26 gauge needle on a Helon syringe, a circular capsulorrhexis was begun and then completed with the Graebo forceps.  Hydrodissection and hydrodelineation were performed using 1% Xylocaine.  The 30 degree phacoemulsification tip was then inserted with slow controlled emulsification of the lens nucleus.  Total ultrasonic time 54 seconds, average power level 17%.  Total amount of fluid used during the emulsification 50 cc.  Following removal  of the nucleus, the residual cortex was aspirated with the irrigation aspiration tip.  The posterior capsule appeared intact with a brilliant red fundus reflex.  It was therefore elected to insert an [employer] [id number] silicon three-piece posterior chamber interocular lens implant with UV absorber, Diopter strength +22.50. This was inserted with the McDonald forceps into the anterior chamber and then centered into the capsular bag using the [facility] lens rotator.  The lens appeared to be well centered.  The Helon which had been used during the procedure was aspirated and replaced with balanced salt solution and Miochol ophthalmic solution.  The operative incisions appeared to be self sealing and no sutures were required.  Maxitrol ointment was instilled in the conjunctival cul-de-sac.  A light patch and protector shield were applied.  Duration of the procedure and anesthesia administration 45 minutes.  The patient tolerated the procedure well in general and left the operating room for the recovery room in good condition. DD:  03/10/00 TD:  03/10/00 Job: [id number]

## 2010-07-12 NOTE — Op Note (Signed)
[place]  Patient:    Ann Booth, Ann Booth                     MRN: [id number] Proc. Date: 05/18/00 Adm. Date:  [id number] Disc. Date: [id number] Attending:  Ivor Messier                           Operative Report  PREOPERATIVE DIAGNOSIS:  Left ankle bimalleolar fracture.  POSTOPERATIVE DIAGNOSIS:  Left ankle bimalleolar fracture.  OPERATIVE PROCEDURE:  Open reduction and internal fixation of left ankle bimalleolar fracture.  SURGEON:  Elana Alm. Thurston Hole, M.D.  ASSISTANT:  Julien Girt, P.A.  ANESTHESIA:  Spinal.  OPERATIVE TIME:  45 minutes.  COMPLICATIONS:  None.  INDICATIONS:  [name] is a 77 year old woman who fell five days ago sustaining a displaced bimalleolar ankle fracture and is now to undergo ORIF of this.  DESCRIPTION OF PROCEDURE:  [name] was brought to the operating room on May 18, 2000 and placed on the operative table in a supine position. After being placed under spinal anesthesia by Dr. Cipriano Mile of anesthesia, she was turned back supine. Her left foot and leg was prepped using sterile Betadine and draped using sterile technique. She received Ancef 1 g IV preoperatively for prophylaxis. Her leg was exsanguinated and a calf tourniquet elevated to 300 mmHg.  Initially through a 5-6 cm lateral distal fibular incision, initial exposure was made. The underlying subcutaneous tissues were incised in line with the skin incision. The peroneal tendons and seral nerve were carefully protected posteriorly while the fracture was subperiosteally exposed. The bone was found to be extremely soft and mildly comminuted at the fracture site and this was carefully held back in place while a 5 hole 1/3 tubular plate was placed along the posterolateral surface with the two most distal screw holes drilled, measured, tapped and the appropriate length 4 mm screws placed and the two most proximal screw holes drilled,  measured, tapped and the appropriate length 3.5 mm cortical screws placed. An interference lag screw could not be placed due to the softness of her bone. After this was done, AP and lateral fluoroscopic x-rays confirmed the anatomic reduction of this fibular fracture. At this point then a 4 cm curvilinear anteromedial incision was made over the medial malleolus. The underlying subcutaneous tissues were incised along with the skin incision. The saphenous vein was carefully protected while a longitudinal incision was made over the periosteum over the fracture site. The fracture was held up into a reduced position while two separate K wires from the cannulated 3.5 mm screw set were placed, one anterior and one posterior in the medial malleolar fragment going across the fracture site and into the distal tibial metaphysis. After these two guide pins were placed, they were measured and then overdrilled with 2.7 mm drills and then 3.5 mm cannulated screws of the appropriate length were placed with washers, thus holding the fracture in a reduced and anatomic position. This was confirmed by intraoperative fluoroscopy that the mortise was reduced to an anatomic position, that all the hardware is in excellent position, and the fractures were reduced anatomically.  The wounds were then irrigated and then closed using 2-0 Vicryl and skin staples. Sterile dressings and a short leg splint was applied. The tourniquet was released. The patient was then taken to the recovery room in stable condition.  FOLLOW-UP CARE:  Ms. Ziesmer will be followed overnight  in the recovery care center for IV pain control and neurovascular monitoring. Discharged tomorrow on Percocet for pain. See me back in the office in a week for sutures out and follow-up. DD:  05/18/00 TD:  05/18/00 Job: [id number]

## 2010-07-12 NOTE — H&P (Signed)
[place]  Patient:    Ann Booth, Ann Booth Visit Number: [id number] MRN: [id number]          Service Type: MED Location: 2000 2030 01 Attending Physician:  Justine Null Dictated by:   Justine Null, M.D. [facility] Admit Date:  03/22/2001 Discharge Date: 03/23/2001                           History and Physical  REASON FOR ADMISSION:  Chest pain.  HISTORY OF PRESENT ILLNESS:  The patient is a 77 year old woman with four hours of severe, sharp quality chest pain localized to the substernal area; there is associated nausea.  Came on while she was resting.  Relived with sublingual nitroglycerin in the emergency department.  PAST MEDICAL HISTORY: 1. Asthma. 2. Osteoporosis.  No history of hypertension, heart disease, cholesterol, nor diabetes.  MEDICATIONS: 1. Actonel. 2. Albuterol inhaler.  SOCIAL HISTORY:  Nonsmoker.  FAMILY HISTORY:  No heart disease except mother had heart disease at age 93.  REVIEW OF SYSTEMS:  Denies the following:  Vomiting, shortness of breath, excessive diaphoresis, fever, chills, headache, loss of consciousness, skin rash, melena, bright red blood per rectum, dysuria, and abdominal pain.  PHYSICAL EXAMINATION:  VITAL SIGNS:  Blood pressure 186/88, heart rate 61, respiratory rate 20, afebrile.  GENERAL:  No distress.  SKIN:  Not diaphoretic.  HEENT:  Head is atraumatic.  Sclerae not icteric.  Pharynx clear.  NECK:  Supple.  CHEST:  Clear to auscultation.  CARDIOVASCULAR:  No JVD, no edema.  Regular rate and rhythm, no murmur.  ABDOMEN:  Soft, nontender.  No hepatosplenomegaly, no mass.  BREAST, GYNECOLOGIC, RECTAL:  Not done at this time due to patient condition.  EXTREMITIES:  No obvious deformity of the joints.  NEUROLOGIC:  Alert, well-oriented, moves all fours.  Cranial nerves are grossly intact.  Patient is examined in supine position only.  LABORATORY DATA:  Chest x-ray and  electrocardiogram are normal.  IMPRESSION:  Chest pain of uncertain etiology.  PLAN: 1. Nitrates. 2. Check CPK and troponin. 3. Treadmill Cardiolite after rules out.Dictated by:   Justine Null, M.D. [facility] Attending Physician:  Justine Null DD:  03/22/01 TD:  03/22/01 Job: [id number]

## 2010-07-12 NOTE — Assessment & Plan Note (Signed)
[facility]                               PULMONARY OFFICE NOTE   Ann Booth, Ann Booth                     MRN:          [id number]  DATE:12/16/2005                            DOB:          03-02-1926    PRIMARY CARE PHYSICIAN:  Dr. Oliver Barre.   OPHTHALMOLOGIST:  Dr. Nelson Chimes.   PROBLEM LIST:  1. Asthma.  2. Allergic conjunctivitis.  3. Rosacea.  4. Mitral valve prolapse.  5. Dry eyes/glaucoma.   HISTORY:  Last here in January.  Comes now saying that she has had a little  bit of asthma in the summer, particularly when she had a cold about two  weeks ago which led to coughing.  She then was exposed to a series of  irritants including sitting next to a large display of flowers.  She says  after that she had persistent cough and throat irritation leading to  increased chest congestion yesterday.  We discussed her allergy vaccine  which is given by her husband at 1:10 with no problems.  She says before her  vaccine she could not sweep her floors because the dust would make her  wheeze and cough and now she can.  We discussed stopping the vaccine and she  is going to consider it.  In the meantime, we discussed risks and benefit  issues including decision about administration outside of the medical  office, anaphylaxis and epinephrine.  I gave her a prescription for an  epinephrine with discussion.  She has never had a significant reaction to  her vaccine.   MEDICATIONS:  Allergy vaccine, Travatan, minocycline use p.r.n., Actonel,  alprazolam 1/2 x25 mg p.r.n.   ALLERGIES:  No medication allergy.   OBJECTIVE:  Weight 128 pounds, BP 118/68, pulse regular 65, room air  saturation 99%.  She is a little puffy around the eyes without significant  injection.  Her nasal airway is unobstructed and there is no drainage or  pharyngeal erythema.  I hear a mild rattle in the posterior right upper lobe  that clears with deep breath.  Otherwise lungs  are quiet.  Heart sounds are  regular without murmur.   IMPRESSION:  Recent asthmatic bronchitis exacerbation I think reflecting a  viral infection and a series of irritant exposures.  She wants the treatment  that had worked for her in the past, nebulizer treatment and Depo-Medrol.  We discussed steroids and acute versus chronic management.  We have reviewed  allergy vaccine issues and she had signed our waiver as above.   PLAN:  1. She will decide whether she wants to continue allergy vaccine.  2. Nebulizer treatment here Xopenex 1.25 mg.  3. Depo-Medrol 80 mg IM with steroid talk.  4. Refilled albuterol 2 puffs p.r.n.  5. Flu vaccine.  6. Schedule return in one year and earlier p.r.n.       Clinton D. Maple Hudson, MD, FCCP, FACP      CDY/MedQ  DD:  12/16/2005  DT:  12/17/2005  Job #:  [id number]

## 2010-07-12 NOTE — H&P (Signed)
[place]  Patient:    Ann Booth, Ann Booth                     MRN: [id number] Adm. Date:  [id number] Attending:  Ivor Messier CC:         Corwin Levins, M.D. [facility]   History and Physical  This was a planned outpatient surgical admission of this 77 year old white female admitted for cataract implant surgery of the right eye.  HISTORY OF PRESENT ILLNESS:  This patient has noted blurring of vision and deterioration of vision over the past several years due to progressive cataract formation.  Recently vision was reduced to 20/100 in each eye and the patient has elected to proceed with cataract implant surgery.  The patient has also been followed in the office for borderline interocular pressure.  She currently, however, is taking no medications for this borderline glaucoma condition.  PAST MEDICAL HISTORY:  The patient is under the care of Dr. Jonny Ruiz and is said to be in satisfactory condition for the proposed surgery under local anesthesia.  REVIEW OF SYSTEMS:  No cardiorespiratory complaints.  PHYSICAL EXAMINATION:  VITAL SIGNS:  As recorded on admission; blood pressure 138/76, respirations 18, pulse 62, and temperature 98.  GENERAL:  The patient is a pleasant, well-nourished, well-developed, 77 year old white female in no acute distress.  HEENT:  Eyes; visual acuity as noted above.  Applanation tonometry; 28 mm each eye.  External ocular and slit lamp examination, the eyes are white and clear with nuclear cataract formation in both eyes.  Fundus examination reveals a clear vitreous, attached retina, with normal optic nerve, blood vessels, and macula.  CHEST:  Lungs clear to auscultation and percussion.  HEART:  Normal sinus rhythm.  No cardiomegaly and no murmurs.  ABDOMEN:  Negative.  EXTREMITIES:  Negative.  ADMISSION DIAGNOSES:  Senile cataract, both eyes.  PLAN:  Cataract implant surgery right eye now, left eye later. DD:   03/10/00 TD:  03/10/00 Job: [id number]

## 2010-07-12 NOTE — Discharge Summary (Signed)
[place]  Patient:    Ann Booth, Ann Booth Visit Number: [id number] MRN: [id number]          Service Type: MED Location: 2000 2030 01 Attending Physician:  Justine Null Dictated by:   Janora Norlander, N.P. Admit Date:  03/22/2001 Discharge Date: 03/23/2001   CC:         Corwin Levins, M.D. [facility]   Discharge Summary  DATE OF BIRTH:  24-Nov-1926  ADMITTING DIAGNOSIS:  Chest pain, rule out myocardial infarction.  DISCHARGE DIAGNOSIS:  Myocardial infarction, ruled out.  DISCHARGE MEDICATIONS: 1. Albuterol inhaler. 2. Actonel.  LABORATORY DATA AND X-RAY FINDINGS:  Cardiac enzymes and troponins were all negative x3.  CBC with WBC 43, H&H 12.3 and 35.8 respectively.  PT/INR 12.4 and 0.9 respectively, PTT 29.  Initial results of the Cardiolite exam on January 28, is negative for ischemia and ejection fraction of 65%  HISTORY OF PRESENT ILLNESS:  The patient was admitted on March 22, 2001, after stating that she had hours of severe sharp quality chest pain which localized in the substernal area and there was an association with nausea. She states that there was relief with sublingual nitroglycerin in the emergency room.  HOSPITAL COURSE:  She was subsequently worked up for cardiac source of her chest pain, all of which were negative.  The patient, upon reviewing her discharge instructions, states that she now feels that the chest pain was associated with an asthmatic attack that she was probably having.  DISCHARGE PHYSICAL EXAMINATION:  GENERAL:  The patient is awake, alert and oriented, ambulatory with no shortness of breath with no dyspnea on exertion.  HEART:  Regular rate and rhythm.  LUNGS:  Clear to auscultation.  There is no edema.  ABDOMEN:  Bowel sounds in all four quadrants.  NEUROLOGIC:  Cranial nerves II-XII intact bilaterally.  CONDITION ON DISCHARGE:  The patient is cleared for  discharge.  FOLLOWUP:  She is to follow up with Dr. Oliver Barre in two weeks. Dictated by:   Janora Norlander, N.P. Attending Physician:  Justine Null DD:  03/23/01 TD:  03/24/01 Job: [id number]

## 2010-07-25 ENCOUNTER — Encounter: Payer: Self-pay | Admitting: Internal Medicine

## 2010-07-25 ENCOUNTER — Ambulatory Visit (INDEPENDENT_AMBULATORY_CARE_PROVIDER_SITE_OTHER): Payer: Medicare Other | Admitting: Internal Medicine

## 2010-07-25 VITALS — BP 142/72 | HR 63 | Temp 98.3°F | Ht 64.0 in | Wt 127.1 lb

## 2010-07-25 DIAGNOSIS — Z0000 Encounter for general adult medical examination without abnormal findings: Secondary | ICD-10-CM | POA: Insufficient documentation

## 2010-07-25 DIAGNOSIS — F411 Generalized anxiety disorder: Secondary | ICD-10-CM

## 2010-07-25 DIAGNOSIS — J45909 Unspecified asthma, uncomplicated: Secondary | ICD-10-CM

## 2010-07-25 DIAGNOSIS — R609 Edema, unspecified: Secondary | ICD-10-CM

## 2010-07-25 LAB — HM MAMMOGRAPHY: HM Mammogram: NEGATIVE

## 2010-07-25 MED ORDER — HYDROCHLOROTHIAZIDE 12.5 MG PO CAPS: 12.5000 mg | ORAL_CAPSULE | Freq: Every day | ORAL | Status: DC

## 2010-07-25 NOTE — Assessment & Plan Note (Signed)
stable overall by hx and exam, most recent data reviewed with pt, and pt to continue medical treatment as before  SpO2 Readings from Last 3 Encounters:  07/25/10 97%  01/29/10 98%  12/05/08 98%

## 2010-07-25 NOTE — Assessment & Plan Note (Signed)
stable overall by hx and exam, most recent data reviewed with pt, and pt to continue medical treatment as before  Lab Results  Component Value Date   WBC 3.9* 07/11/2010   HGB 11.1* 07/11/2010   HCT 32.9* 07/11/2010   PLT 263 07/11/2010   CHOL 176 01/29/2010   TRIG 45.0 01/29/2010   HDL 94.50 01/29/2010   ALT 24 01/29/2010   AST 34 01/29/2010   NA 138 07/11/2010   K 4.2 07/11/2010   CL 102 07/11/2010   CREATININE 0.61 07/11/2010   BUN 15 07/11/2010   CO2 27 07/11/2010   TSH 0.55 01/29/2010

## 2010-07-25 NOTE — Assessment & Plan Note (Addendum)
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

## 2010-07-25 NOTE — Progress Notes (Signed)
  Subjective:    Patient ID: Ann Booth, female    DOB: 06/22/1926, 77 y.o.   MRN: [id number]  HPI  Here after being seen and tx in the ER recently after a fall with right humerus fx, then one wk later with pleurisy after extensive ER eval including CT angio, ECG, CXR, labs.  Since then has developed LE edema bilat , right > left without pain.  Pt denies chest pain, increased sob or doe, wheezing, orthopnea, PND, palpitations, dizziness or syncope. Pt denies new neurological symptoms such as new headache, or facial or extremity weakness or numbness   Pt denies polydipsia, polyuria  Pt states overall good compliance with meds.   Pt denies fever, wt loss, night sweats, loss of appetite, or other constitutional symptoms Denies worsening depressive symptoms, suicidal ideation, or panic, though has ongoing anxiety, not increased recently.   Past Medical History  Diagnosis Date  . ALLERGIC RHINITIS 06/01/2007  . ANXIETY 10/18/2006  . ASTHMA 10/15/2006  . BACK PAIN 11/09/2008  . CATARACT NOS 10/15/2006  . DEPRESSION 10/18/2006  . EXTERNAL HEMORRHOIDS 09/06/2008  . FATIGUE 06/01/2007  . FREQUENCY, URINARY 06/01/2007  . GLAUCOMA NOS 10/15/2006  . Hypercalcemia 11/09/2008  . HYPERLIPIDEMIA 10/18/2006  . MITRAL VALVE PROLAPSE 10/15/2006  . OSTEOPOROSIS 10/15/2006  . PERIPHERAL NEUROPATHY 06/01/2007  . POSITIONAL VERTIGO 11/09/2008  . Rosacea 10/15/2006  . SHOULDER PAIN, BILATERAL 06/01/2007   Past Surgical History  Procedure Date  . Cataract extraction, bilateral   . Posterior chamber interocular lens implant   . Hemorrhoid surgery   .  ankle surgury     left ankle    reports that she has never smoked. She does not have any smokeless tobacco history on file. She reports that she does not drink alcohol or use illicit drugs. family history includes Cancer in her brother and sister. No Known Allergies No current outpatient prescriptions on file prior to visit.   Review of Systems All otherwise neg per pt    Objective:   Physical Exam BP 142/72  Pulse 63  Temp(Src) 98.3 F (36.8 C) (Oral)  Ht 5\' 4"  (1.626 m)  Wt 127 lb 2 oz (57.664 kg)  BMI 21.82 kg/m2  SpO2 97% Physical Exam  VS noted Constitutional: Pt appears well-developed and well-nourished.  HENT: Head: Normocephalic.  Right Ear: External ear normal.  Left Ear: External ear normal.  No JVD Eyes: Conjunctivae and EOM are normal. Pupils are equal, round, and reactive to light.  Neck: Normal range of motion. Neck supple.  Cardiovascular: Normal rate and regular rhythm.   Pulmonary/Chest: Effort normal and breath sounds normal.  Abd:  Soft, NT, non-distended, + BS Neurological: Pt is alert. No cranial nerve deficit.  Skin: Skin is warm. No erythema. 1+ edema to above the knee RLE,  LLE with trace to 1+ edema to below the knee, calves NT, neg homans Psychiatric: Pt behavior is normal. Thought content normal. 1+ nervous        Assessment & Plan:

## 2010-07-25 NOTE — Patient Instructions (Signed)
Please Take all new medications as prescribed - the fluid pill Continue all other medications as before Your EKG was ok today - similar to the last one from the ER You will be contacted regarding the referral for: echocardiogram Please return in 1 month, or sooner if needed

## 2010-08-07 ENCOUNTER — Other Ambulatory Visit (HOSPITAL_COMMUNITY): Payer: Medicare Other | Admitting: Radiology

## 2010-08-19 ENCOUNTER — Ambulatory Visit (HOSPITAL_COMMUNITY): Payer: Medicare Other | Attending: Internal Medicine | Admitting: Radiology

## 2010-08-19 DIAGNOSIS — R609 Edema, unspecified: Secondary | ICD-10-CM

## 2010-08-19 DIAGNOSIS — I059 Rheumatic mitral valve disease, unspecified: Secondary | ICD-10-CM

## 2010-08-19 DIAGNOSIS — J45909 Unspecified asthma, uncomplicated: Secondary | ICD-10-CM | POA: Insufficient documentation

## 2010-08-19 DIAGNOSIS — I079 Rheumatic tricuspid valve disease, unspecified: Secondary | ICD-10-CM | POA: Insufficient documentation

## 2010-08-19 DIAGNOSIS — I379 Nonrheumatic pulmonary valve disorder, unspecified: Secondary | ICD-10-CM | POA: Insufficient documentation

## 2010-08-19 DIAGNOSIS — M7989 Other specified soft tissue disorders: Secondary | ICD-10-CM | POA: Insufficient documentation

## 2010-08-19 DIAGNOSIS — E785 Hyperlipidemia, unspecified: Secondary | ICD-10-CM | POA: Insufficient documentation

## 2010-08-23 ENCOUNTER — Encounter: Payer: Self-pay | Admitting: Internal Medicine

## 2010-08-23 ENCOUNTER — Other Ambulatory Visit (INDEPENDENT_AMBULATORY_CARE_PROVIDER_SITE_OTHER): Payer: Medicare Other

## 2010-08-23 ENCOUNTER — Ambulatory Visit (INDEPENDENT_AMBULATORY_CARE_PROVIDER_SITE_OTHER): Payer: Medicare Other | Admitting: Internal Medicine

## 2010-08-23 DIAGNOSIS — R509 Fever, unspecified: Secondary | ICD-10-CM

## 2010-08-23 DIAGNOSIS — Z0000 Encounter for general adult medical examination without abnormal findings: Secondary | ICD-10-CM

## 2010-08-23 DIAGNOSIS — R609 Edema, unspecified: Secondary | ICD-10-CM

## 2010-08-23 DIAGNOSIS — I10 Essential (primary) hypertension: Secondary | ICD-10-CM | POA: Insufficient documentation

## 2010-08-23 LAB — URINALYSIS, ROUTINE W REFLEX MICROSCOPIC
Bilirubin Urine: NEGATIVE
Hgb urine dipstick: NEGATIVE
Nitrite: NEGATIVE
Total Protein, Urine: NEGATIVE
Urobilinogen, UA: 0.2 (ref 0.0–1.0)

## 2010-08-23 MED ORDER — LOSARTAN POTASSIUM 50 MG PO TABS: 50.0000 mg | ORAL_TABLET | Freq: Every day | ORAL | Status: DC

## 2010-08-23 MED ORDER — CEPHALEXIN 500 MG PO CAPS: 500.0000 mg | ORAL_CAPSULE | Freq: Four times a day (QID) | ORAL | Status: AC

## 2010-08-23 NOTE — Patient Instructions (Signed)
Take all new medications as prescribed - the cozaar (sent to [facility]) Continue all other medications as before Please go to LAB in the Basement for the blood and/or urine tests to be done today Please check with your [facility] pharmacy later to see if we have sent in a antibiotic

## 2010-08-23 NOTE — Assessment & Plan Note (Signed)
New onset mild persistent, normal recent cr , normal EF by echo; to add cozaar 50, f/u BP at home and next visit

## 2010-08-23 NOTE — Assessment & Plan Note (Signed)
Low grade, exam benign, with recurrent UTI - for UA check

## 2010-08-23 NOTE — Assessment & Plan Note (Signed)
Improved, stable overall by hx and exam, most recent data reviewed with pt, and pt to continue medical treatment as before  Lab Results  Component Value Date   WBC 3.9* 07/11/2010   HGB 11.1* 07/11/2010   HCT 32.9* 07/11/2010   PLT 263 07/11/2010   CHOL 176 01/29/2010   TRIG 45.0 01/29/2010   HDL 94.50 01/29/2010   ALT 24 01/29/2010   AST 34 01/29/2010   NA 138 07/11/2010   K 4.2 07/11/2010   CL 102 07/11/2010   CREATININE 0.61 07/11/2010   BUN 15 07/11/2010   CO2 27 07/11/2010   TSH 0.55 01/29/2010   Echo also reviewed with pt from June 2012 and copy given, normal EF

## 2010-08-25 ENCOUNTER — Encounter: Payer: Self-pay | Admitting: Internal Medicine

## 2010-08-25 NOTE — Progress Notes (Signed)
Subjective:    Patient ID: Ann Booth, female    DOB: December 06, 1926, 77 y.o.   MRN: [id number]  HPI here to f.u;  Has some persistent LE edema, mild , goes away at night;  Pt denies chest pain, increased sob or doe, wheezing, orthopnea, PND, increased LE swelling, palpitations, dizziness or syncope.  .Pt denies new neurological symptoms such as new headache, or facial or extremity weakness or numbness   Pt denies polydipsia, polyuria.  Denies worsening depressive symptoms, suicidal ideation, or panic, though has ongoing anxiety, not increased recently.   No other new complaints and is unaware of any fever today,  Denies urinary symptoms such as dysuria, frequency, urgency,or hematuria.  No ST HA, cough. Past Medical History  Diagnosis Date  . ALLERGIC RHINITIS 06/01/2007  . ANXIETY 10/18/2006  . ASTHMA 10/15/2006  . BACK PAIN 11/09/2008  . CATARACT NOS 10/15/2006  . DEPRESSION 10/18/2006  . EXTERNAL HEMORRHOIDS 09/06/2008  . FATIGUE 06/01/2007  . FREQUENCY, URINARY 06/01/2007  . GLAUCOMA NOS 10/15/2006  . Hypercalcemia 11/09/2008  . HYPERLIPIDEMIA 10/18/2006  . MITRAL VALVE PROLAPSE 10/15/2006  . OSTEOPOROSIS 10/15/2006  . PERIPHERAL NEUROPATHY 06/01/2007  . POSITIONAL VERTIGO 11/09/2008  . Rosacea 10/15/2006  . SHOULDER PAIN, BILATERAL 06/01/2007  . HTN (hypertension) 08/23/2010   Past Surgical History  Procedure Date  . Cataract extraction, bilateral   . Posterior chamber interocular lens implant   . Hemorrhoid surgery   .  ankle surgury     left ankle    reports that she has never smoked. She does not have any smokeless tobacco history on file. She reports that she does not drink alcohol or use illicit drugs. family history includes Cancer in her brother and sister. No Known Allergies Current Outpatient Prescriptions on File Prior to Visit  Medication Sig Dispense Refill  . ALPRAZolam (XANAX) 0.5 MG tablet Take 0.5 mg by mouth at bedtime as needed.        Marland Kitchen aspirin 81 MG tablet Take 81 mg by  mouth daily.        . bimatoprost (LUMIGAN) 0.03 % ophthalmic solution Place 1 drop into both eyes daily.        . brimonidine (ALPHAGAN) 0.15 % ophthalmic solution One drop every night       . hydrochlorothiazide (,MICROZIDE/HYDRODIURIL,) 12.5 MG capsule Take 1 capsule (12.5 mg total) by mouth daily.  90 capsule  3  . risedronate (ACTONEL) 150 MG tablet Take 150 mg by mouth every 30 (thirty) days. with water on empty stomach, nothing by mouth or lie down for next 30 minutes.        Review of Systems Physical Exam  VS noted Constitutional: Pt appears well-developed and well-nourished.  HENT: Head: Normocephalic.  Right Ear: External ear normal.  Left Ear: External ear normal.  Eyes: Conjunctivae and EOM are normal. Pupils are equal, round, and reactive to light.  Neck: Normal range of motion. Neck supple.  Cardiovascular: Normal rate and regular rhythm.   Pulmonary/Chest: Effort normal and breath sounds normal.     Objective:   Physical Exam BP 150/68  Pulse 58  Temp(Src) 99.1 F (37.3 C) (Oral)  Ht 5\' 4"  (1.626 m)  Wt 122 lb 8 oz (55.566 kg)  BMI 21.03 kg/m2  SpO2 97% Physical Exam  VS noted, not ill appearing Constitutional: Pt appears well-developed and well-nourished.  HENT: Head: Normocephalic.  Right Ear: External ear normal.  Left Ear: External ear normal.  Eyes: Conjunctivae and EOM are normal. Pupils  are equal, round, and reactive to light.  Neck: Normal range of motion. Neck supple.  Cardiovascular: Normal rate and regular rhythm.   Pulmonary/Chest: Effort normal and breath sounds normal.  Abd:  Soft, NT, non-distended, + BS Neurological: Pt is alert. No cranial nerve deficit.  Skin: Skin is warm. No erythema. trace edema only pedal Psychiatric: Pt behavior is normal. Thought content normal.         Assessment & Plan:

## 2010-08-27 ENCOUNTER — Telehealth: Payer: Self-pay | Admitting: Internal Medicine

## 2010-08-27 NOTE — Telephone Encounter (Signed)
Done in error.

## 2010-11-21 ENCOUNTER — Ambulatory Visit (INDEPENDENT_AMBULATORY_CARE_PROVIDER_SITE_OTHER): Payer: Medicare Other

## 2010-11-21 DIAGNOSIS — Z23 Encounter for immunization: Secondary | ICD-10-CM

## 2010-11-29 LAB — POCT CARDIAC MARKERS
Myoglobin, poc: 176
Operator id: 270651
Troponin i, poc: <0.05

## 2010-11-29 LAB — URINE MICROSCOPIC-ADD ON

## 2010-11-29 LAB — POCT I-STAT CREATININE: Operator id: 270651

## 2010-11-29 LAB — I-STAT 8, (EC8 V) (CONVERTED LAB)
Chloride: 108
Glucose, Bld: 136 — ABNORMAL HIGH
Potassium: 4.1
pCO2, Ven: 33.4 — ABNORMAL LOW
pH, Ven: 7.458 — ABNORMAL HIGH

## 2011-05-07 ENCOUNTER — Ambulatory Visit (INDEPENDENT_AMBULATORY_CARE_PROVIDER_SITE_OTHER): Payer: Medicare Other | Admitting: Internal Medicine

## 2011-05-07 ENCOUNTER — Encounter: Payer: Self-pay | Admitting: Internal Medicine

## 2011-05-07 ENCOUNTER — Other Ambulatory Visit (INDEPENDENT_AMBULATORY_CARE_PROVIDER_SITE_OTHER): Payer: Medicare Other

## 2011-05-07 VITALS — BP 128/70 | HR 60 | Temp 98.3°F | Ht 64.0 in | Wt 119.0 lb

## 2011-05-07 DIAGNOSIS — Z0000 Encounter for general adult medical examination without abnormal findings: Secondary | ICD-10-CM

## 2011-05-07 DIAGNOSIS — E785 Hyperlipidemia, unspecified: Secondary | ICD-10-CM

## 2011-05-07 DIAGNOSIS — I10 Essential (primary) hypertension: Secondary | ICD-10-CM

## 2011-05-07 DIAGNOSIS — F329 Major depressive disorder, single episode, unspecified: Secondary | ICD-10-CM

## 2011-05-07 DIAGNOSIS — M810 Age-related osteoporosis without current pathological fracture: Secondary | ICD-10-CM

## 2011-05-07 LAB — URINALYSIS, ROUTINE W REFLEX MICROSCOPIC
Hgb urine dipstick: NEGATIVE
Nitrite: NEGATIVE
Specific Gravity, Urine: 1.01 (ref 1.000–1.030)
Total Protein, Urine: NEGATIVE
Urine Glucose: NEGATIVE
Urobilinogen, UA: 0.2 (ref 0.0–1.0)

## 2011-05-07 LAB — CBC WITH DIFFERENTIAL/PLATELET
Basophils Absolute: 0 10*3/uL (ref 0.0–0.1)
Basophils Relative: 0.9 % (ref 0.0–3.0)
Hemoglobin: 12.8 g/dL (ref 12.0–15.0)
Lymphocytes Relative: 29 % (ref 12.0–46.0)
Monocytes Relative: 11.1 % (ref 3.0–12.0)
Neutro Abs: 2 10*3/uL (ref 1.4–7.7)
Neutrophils Relative %: 51.3 % (ref 43.0–77.0)
RBC: 3.96 Mil/uL (ref 3.87–5.11)

## 2011-05-07 MED ORDER — ALPRAZOLAM 0.5 MG PO TABS: 0.5000 mg | ORAL_TABLET | Freq: Every evening | ORAL | Status: DC | PRN

## 2011-05-07 MED ORDER — CITALOPRAM HYDROBROMIDE 10 MG PO TABS: 10.0000 mg | ORAL_TABLET | Freq: Every day | ORAL | Status: DC

## 2011-05-07 NOTE — Progress Notes (Signed)
Subjective:    Patient ID: Ann Booth, female    DOB: 1926-08-13, 78 y.o.   MRN: [id number]  HPI  Here for wellness and f/u;  Overall doing ok;  Pt denies CP, worsening SOB, DOE, wheezing, orthopnea, PND, worsening LE edema, palpitations, dizziness or syncope.  Pt denies neurological change such as new Headache, facial or extremity weakness.  Pt denies polydipsia, polyuria, or low sugar symptoms. Pt states overall good compliance with treatment and medications, good tolerability, and trying to follow lower cholesterol diet.. No fever, wt loss, night sweats, loss of appetite, or other constitutional symptoms.  Pt states good ability with ADL's, low fall risk, home safety reviewed and adequate, no significant changes in hearing or vision, and occasionally active with exercise.  Has had marked increase in  Stress over effort to be POA for an elderly relative which is now resolved, but cant seem to stop thinking and worriying about it, cant get to sleep well at night and with mild depressive symtpoms.  Note actonel use is > 5 yrs. Has not been taking the HCT, BP at home < 140/90 Past Medical History  Diagnosis Date  . ALLERGIC RHINITIS 06/01/2007  . ANXIETY 10/18/2006  . ASTHMA 10/15/2006  . BACK PAIN 11/09/2008  . CATARACT NOS 10/15/2006  . DEPRESSION 10/18/2006  . EXTERNAL HEMORRHOIDS 09/06/2008  . FATIGUE 06/01/2007  . FREQUENCY, URINARY 06/01/2007  . GLAUCOMA NOS 10/15/2006  . Hypercalcemia 11/09/2008  . HYPERLIPIDEMIA 10/18/2006  . MITRAL VALVE PROLAPSE 10/15/2006  . OSTEOPOROSIS 10/15/2006  . PERIPHERAL NEUROPATHY 06/01/2007  . POSITIONAL VERTIGO 11/09/2008  . Rosacea 10/15/2006  . SHOULDER PAIN, BILATERAL 06/01/2007  . HTN (hypertension) 08/23/2010   Past Surgical History  Procedure Date  . Cataract extraction, bilateral   . Posterior chamber interocular lens implant   . Hemorrhoid surgery   .  ankle surgury     left ankle    reports that she has never smoked. She does not have any smokeless  tobacco history on file. She reports that she does not drink alcohol or use illicit drugs. family history includes Cancer in her brother and sister. No Known Allergies Current Outpatient Prescriptions on File Prior to Visit  Medication Sig Dispense Refill  . aspirin 81 MG tablet Take 81 mg by mouth daily.        . bimatoprost (LUMIGAN) 0.03 % ophthalmic solution Place 1 drop into both eyes daily.        . brimonidine (ALPHAGAN) 0.15 % ophthalmic solution One drop every night       . losartan (COZAAR) 50 MG tablet Take 1 tablet (50 mg total) by mouth daily.  90 tablet  3   Review of Systems Review of Systems  Constitutional: Negative for diaphoresis, activity change, appetite change and unexpected weight change.  HENT: Negative for hearing loss, ear pain, facial swelling, mouth sores and neck stiffness.   Eyes: Negative for pain, redness and visual disturbance.  Respiratory: Negative for shortness of breath and wheezing.   Cardiovascular: Negative for chest pain and palpitations.  Gastrointestinal: Negative for diarrhea, blood in stool, abdominal distention and rectal pain.  Genitourinary: Negative for hematuria, flank pain and decreased urine volume.  Musculoskeletal: Negative for myalgias and joint swelling.  Skin: Negative for color change and wound.  Neurological: Negative for syncope and numbness.  Hematological: Negative for adenopathy.  Psychiatric/Behavioral: Negative for hallucinations, self-injury, decreased concentration and agitation.      Objective:   Physical Exam BP 128/70  Pulse 60  Temp(Src) 98.3 F (36.8 C) (Oral)  Ht 5\' 4"  (1.626 m)  Wt 119 lb (53.978 kg)  BMI 20.43 kg/m2  SpO2 98% Physical Exam  VS noted Constitutional: Pt is oriented to person, place, and time. Appears well-developed and well-nourished.  HENT:  Head: Normocephalic and atraumatic.  Right Ear: External ear normal.  Left Ear: External ear normal.  Nose: Nose normal.  Mouth/Throat:  Oropharynx is clear and moist.  Eyes: Conjunctivae and EOM are normal. Pupils are equal, round, and reactive to light.  Neck: Normal range of motion. Neck supple. No JVD present. No tracheal deviation present.  Cardiovascular: Normal rate, regular rhythm, normal heart sounds and intact distal pulses.   Pulmonary/Chest: Effort normal and breath sounds normal.  Abdominal: Soft. Bowel sounds are normal. There is no tenderness.  Musculoskeletal: Normal range of motion. Exhibits no edema.  Lymphadenopathy:  Has no cervical adenopathy.  Neurological: Pt is alert and oriented to person, place, and time. Pt has normal reflexes. No cranial nerve deficit.  Skin: Skin is warm and dry. No rash noted.  Psychiatric: 1-2+ nervous. Behavior is normal.     Assessment & Plan:

## 2011-05-07 NOTE — Assessment & Plan Note (Signed)

## 2011-05-07 NOTE — Assessment & Plan Note (Signed)
Ok to d/c the actonel, f/u dxa at 2-3 yrs

## 2011-05-07 NOTE — Patient Instructions (Addendum)
Take all new medications as prescribed - the citalopram 10 mg per day  OK to not take the Actonel further, or the fluid pill (the HCTZ) Continue all other medications as before, including the xanax as needed Please have the pharmacy call if you need refills Please go to LAB in the Basement for the blood and/or urine tests to be done today Please call the phone number [phone] (the [employer] System) for results of testing in 2-3 days;  When calling, simply dial the number, and when prompted enter the MRN number above (the Medical Record Number) and the # key, then the message should start. If the phone number does not work, you should receive a letter in the mail in a few days, or you will be called if there is an urgent problem for which you need to be notified. Please return in 6 months, or sooner if needed

## 2011-05-07 NOTE — Assessment & Plan Note (Signed)
To start citalopram 10 qd 

## 2011-05-07 NOTE — Assessment & Plan Note (Signed)
stable overall by hx and exam, most recent data reviewed with pt, and pt to continue medical treatment as before, ok to be off the HCT

## 2011-05-08 ENCOUNTER — Encounter: Payer: Self-pay | Admitting: Internal Medicine

## 2011-05-08 ENCOUNTER — Telehealth: Payer: Self-pay

## 2011-05-08 LAB — BASIC METABOLIC PANEL
BUN: 19 mg/dL (ref 6–23)
CO2: 33 mEq/L — ABNORMAL HIGH (ref 19–32)
Chloride: 100 mEq/L (ref 96–112)
Glucose, Bld: 94 mg/dL (ref 70–99)
Potassium: 4.9 mEq/L (ref 3.5–5.1)

## 2011-05-08 LAB — LIPID PANEL
Cholesterol: 179 mg/dL (ref 0–200)
Triglycerides: 83 mg/dL (ref 0.0–149.0)

## 2011-05-08 LAB — HEPATIC FUNCTION PANEL
ALT: 23 U/L (ref 0–35)
Albumin: 3.8 g/dL (ref 3.5–5.2)
Total Protein: 6.4 g/dL (ref 6.0–8.3)

## 2011-05-08 NOTE — Telephone Encounter (Signed)
Message copied by Anselm Jungling on Thu May 08, 2011  4:42 PM ------      Message from: Corwin Levins      Created: Wed May 07, 2011  5:39 PM       Studies have shown Increased CV risk ( such as risk of heart attack) - so not a good idea to take any extra vit e      ----- Message -----         From: Anselm Jungling, CMA         Sent: 05/07/2011   5:11 PM           To: Corwin Levins, MD            Pt states that she was advised during OV to discontinue Vit E but she cannot remember why. Please advise?

## 2011-05-09 NOTE — Telephone Encounter (Signed)
Left message on machine for pt to return my call  

## 2011-05-12 NOTE — Telephone Encounter (Signed)
Pt informed

## 2011-06-24 ENCOUNTER — Other Ambulatory Visit: Payer: Self-pay | Admitting: Internal Medicine

## 2011-06-24 DIAGNOSIS — I10 Essential (primary) hypertension: Secondary | ICD-10-CM

## 2011-06-24 MED ORDER — LOSARTAN POTASSIUM 50 MG PO TABS: 50.0000 mg | ORAL_TABLET | Freq: Every day | ORAL | Status: DC

## 2011-06-24 NOTE — Telephone Encounter (Signed)
Done per emr 

## 2011-06-24 NOTE — Telephone Encounter (Signed)
Pt requesting losartan--50 mg--3 mth supply--[facility]--

## 2011-06-24 NOTE — Telephone Encounter (Signed)
Addended by: Corwin Levins on: 06/24/2011 12:32 PM   Modules accepted: Orders

## 2011-06-24 NOTE — Telephone Encounter (Signed)
Faxed to pharmacy

## 2011-07-17 ENCOUNTER — Ambulatory Visit (INDEPENDENT_AMBULATORY_CARE_PROVIDER_SITE_OTHER): Payer: Medicare Other | Admitting: Internal Medicine

## 2011-07-17 ENCOUNTER — Encounter: Payer: Self-pay | Admitting: Internal Medicine

## 2011-07-17 VITALS — BP 158/70 | HR 58 | Temp 98.5°F | Ht 62.0 in | Wt 119.0 lb

## 2011-07-17 DIAGNOSIS — I10 Essential (primary) hypertension: Secondary | ICD-10-CM

## 2011-07-17 DIAGNOSIS — F411 Generalized anxiety disorder: Secondary | ICD-10-CM

## 2011-07-17 MED ORDER — ALPRAZOLAM 0.25 MG PO TABS: 0.1250 mg | ORAL_TABLET | Freq: Three times a day (TID) | ORAL | Status: DC | PRN

## 2011-07-17 NOTE — Assessment & Plan Note (Signed)
BP Readings from Last 3 Encounters:  07/17/11 158/70  05/07/11 128/70  08/23/10 150/68  will increase losartan 50 to bid given med sensitivity and reluctance to try alternate pills follow up PCP in 6 weeks, sooner if problems

## 2011-07-17 NOTE — Assessment & Plan Note (Signed)
Not taking lexapro due to sedating side effects Will reduce xanax due to sedation and encouraged pt to use as needed Support offered - caring for elderly and ill spouse with freq hosp and snf in last 3 months

## 2011-07-17 NOTE — Patient Instructions (Signed)
It was good to see you today. Increase losartan to 50mg  2x/day Decrease dose of xanax from 0.5 to 0.25 - use as needed for anxiety and panic symptoms - Your prescription(s) have been given to you to submit to your pharmacy. Please take as directed and contact our office if you believe you are having problem(s) with the medication(s).

## 2011-07-17 NOTE — Progress Notes (Signed)
  Subjective:    Patient ID: Ann Booth, female    DOB: March 25, 1926, 78 y.o.   MRN: [id number]  HPI  Concerned about increase blood pressure Reports compliance with ARB Feels exacerbated by stress - caring for ill/hosp spouse Increase anxiety with stress  Past Medical History  Diagnosis Date  . ALLERGIC RHINITIS   . ANXIETY   . ASTHMA   . BACK PAIN   . CATARACT NOS   . DEPRESSION   . GLAUCOMA NOS   . Hypercalcemia   . HYPERLIPIDEMIA   . MITRAL VALVE PROLAPSE   . OSTEOPOROSIS   . PERIPHERAL NEUROPATHY   . POSITIONAL VERTIGO   . Rosacea   . SHOULDER PAIN, BILATERAL   . HTN (hypertension)     Review of Systems  Constitutional: Positive for fatigue. Negative for fever and unexpected weight change.  Cardiovascular: Negative for chest pain and leg swelling.  Psychiatric/Behavioral: Positive for dysphoric mood. Negative for suicidal ideas, confusion, sleep disturbance and self-injury. The patient is nervous/anxious.        Objective:   Physical Exam BP 158/70  Pulse 58  Temp(Src) 98.5 F (36.9 C) (Oral)  Ht 5\' 2"  (1.575 m)  Wt 119 lb (53.978 kg)  BMI 21.77 kg/m2  SpO2 97% Wt Readings from Last 3 Encounters:  07/17/11 119 lb (53.978 kg)  05/07/11 119 lb (53.978 kg)  08/23/10 122 lb 8 oz (55.566 kg)   Constitutional: She appears well-developed and well-nourished. No distress.  Eyes: B conjunctivae injected, EOM are normal. Pupils are equal, round, and reactive to light. No scleral icterus.  Neck: Normal range of motion. Neck supple. No JVD present. No thyromegaly present.  Cardiovascular: Normal rate, regular rhythm and normal heart sounds.  No murmur heard. No BLE edema. Pulmonary/Chest: Effort normal and breath sounds normal. No respiratory distress. She has no wheezes.  Psychiatric: She has a dysphoric mood and affect. Her behavior is normal. Judgment and thought content normal.   Lab Results  Component Value Date   WBC 3.9* 05/07/2011   HGB 12.8 05/07/2011   HCT 38.3 05/07/2011   PLT 156.0 05/07/2011   GLUCOSE 94 05/07/2011   CHOL 179 05/07/2011   TRIG 83.0 05/07/2011   HDL 85.30 05/07/2011   LDLCALC 77 05/07/2011   ALT 23 05/07/2011   AST 36 05/07/2011   NA 140 05/07/2011   K 4.9 05/07/2011   CL 100 05/07/2011   CREATININE 0.7 05/07/2011   BUN 19 05/07/2011   CO2 33* 05/07/2011   TSH 0.92 05/07/2011       Assessment & Plan:  See problem list. Medications and labs reviewed today.

## 2011-07-28 ENCOUNTER — Ambulatory Visit (INDEPENDENT_AMBULATORY_CARE_PROVIDER_SITE_OTHER): Payer: Medicare Other | Admitting: Internal Medicine

## 2011-07-28 ENCOUNTER — Encounter: Payer: Self-pay | Admitting: Internal Medicine

## 2011-07-28 VITALS — BP 102/62 | HR 70 | Temp 98.5°F | Ht 64.0 in | Wt 118.8 lb

## 2011-07-28 DIAGNOSIS — F411 Generalized anxiety disorder: Secondary | ICD-10-CM

## 2011-07-28 DIAGNOSIS — J209 Acute bronchitis, unspecified: Secondary | ICD-10-CM | POA: Insufficient documentation

## 2011-07-28 DIAGNOSIS — I10 Essential (primary) hypertension: Secondary | ICD-10-CM

## 2011-07-28 DIAGNOSIS — J45909 Unspecified asthma, uncomplicated: Secondary | ICD-10-CM

## 2011-07-28 MED ORDER — HYDROCODONE-HOMATROPINE 5-1.5 MG/5ML PO SYRP: 5.0000 mL | ORAL_SOLUTION | Freq: Four times a day (QID) | ORAL | Status: AC | PRN

## 2011-07-28 MED ORDER — LEVOFLOXACIN 250 MG PO TABS: 250.0000 mg | ORAL_TABLET | Freq: Every day | ORAL | Status: AC

## 2011-07-28 NOTE — Patient Instructions (Addendum)
Take all new medications as prescribed Continue all other medications as before Please call or return if you are getting worse, such as worsening shortness or breath or wheezing

## 2011-07-28 NOTE — Assessment & Plan Note (Signed)
Mild to mod, for antibx course,  to f/u any worsening symptoms or concerns 

## 2011-07-28 NOTE — Assessment & Plan Note (Signed)
stable overall by hx and exam, most recent data reviewed with pt, and pt to continue medical treatment as before SpO2 Readings from Last 3 Encounters:  07/28/11 96%  07/17/11 97%  05/07/11 98%

## 2011-07-28 NOTE — Progress Notes (Signed)
Subjective:    Patient ID: Ann Booth, female    DOB: May 14, 1926, 78 y.o.   MRN: [id number]  HPI  Here with acute onset mild to mod 2-3 days ST, HA, general weakness and malaise, with prod cough greenish sputum, but Pt denies chest pain, increased sob or doe, wheezing, orthopnea, PND, increased LE swelling, palpitations, dizziness or syncope.  Pt denies new neurological symptoms such as new headache, or facial or extremity weakness or numbness   Pt denies polydipsia, polyuria.  Denies worsening depressive symptoms, suicidal ideation, or panic.  Husband ill recently which has increased her stress.   Past Medical History  Diagnosis Date  . ALLERGIC RHINITIS   . ANXIETY   . ASTHMA   . BACK PAIN   . CATARACT NOS   . DEPRESSION   . GLAUCOMA NOS   . Hypercalcemia   . HYPERLIPIDEMIA   . MITRAL VALVE PROLAPSE   . OSTEOPOROSIS   . PERIPHERAL NEUROPATHY   . POSITIONAL VERTIGO   . Rosacea   . SHOULDER PAIN, BILATERAL   . HTN (hypertension)    Past Surgical History  Procedure Date  . Cataract extraction, bilateral   . Posterior chamber interocular lens implant   . Hemorrhoid surgery   .  ankle surgury     left ankle    reports that she has never smoked. She does not have any smokeless tobacco history on file. She reports that she does not drink alcohol or use illicit drugs. family history includes Cancer in her brother and sister. No Known Allergies Current Outpatient Prescriptions on File Prior to Visit  Medication Sig Dispense Refill  . ALPRAZolam (XANAX) 0.25 MG tablet Take 0.5-1 tablets (0.125-0.25 mg total) by mouth 3 (three) times daily as needed for sleep or anxiety.  30 tablet  3  . aspirin 81 MG tablet Take 81 mg by mouth daily.        . bimatoprost (LUMIGAN) 0.03 % ophthalmic solution Place 1 drop into both eyes daily.        . brimonidine (ALPHAGAN) 0.15 % ophthalmic solution One drop every night       . losartan (COZAAR) 50 MG tablet Take 1 tablet (50 mg total) by mouth  2 (two) times daily.  180 tablet  3  . DISCONTD: hydrochlorothiazide (,MICROZIDE/HYDRODIURIL,) 12.5 MG capsule Take 1 capsule (12.5 mg total) by mouth daily.  90 capsule  3   Review of Systems Review of Systems  Constitutional: Negative for diaphoresis and unexpected weight change.  HENT: Negative for drooling and tinnitus.   Eyes: Negative for photophobia and visual disturbance.  Respiratory: Negative for choking and stridor.   Gastrointestinal: Negative for vomiting and blood in stool.  Genitourinary: Negative for hematuria and decreased urine volume.  Musculoskeletal: Negative for gait problem.  Skin: Negative for color change and wound.     Objective:   Physical Exam BP 102/62  Pulse 70  Temp(Src) 98.5 F (36.9 C) (Oral)  Ht 5\' 4"  (1.626 m)  Wt 118 lb 12 oz (53.865 kg)  BMI 20.38 kg/m2  SpO2 96% Physical Exam  VS noted Constitutional: Pt appears well-developed and well-nourished.  HENT: Head: Normocephalic.  Right Ear: External ear normal.  Left Ear: External ear normal.  Bilat tm's mild erythema.  Sinus nontender.  Pharynx mild erythema Eyes: Conjunctivae and EOM are normal. Pupils are equal, round, and reactive to light.  Neck: Normal range of motion. Neck supple.  Cardiovascular: Normal rate and regular rhythm.   Pulmonary/Chest:  Effort normal and breath sounds normal.  Neurological: Pt is alert. Not confused Skin: Skin is warm. No erythema. No rash Psychiatric: Pt behavior is normal. Thought content normal. 1+ nervous    Assessment & Plan:

## 2011-07-28 NOTE — Assessment & Plan Note (Signed)
stable overall by hx and exam, most recent data reviewed with pt, and pt to continue medical treatment as before BP Readings from Last 3 Encounters:  07/28/11 102/62  07/17/11 158/70  05/07/11 128/70

## 2011-07-28 NOTE — Assessment & Plan Note (Signed)
Husband with worsening illness lately, with abd abscess, but o/w stable overall by hx and exam, most recent data reviewed with pt, and pt to continue medical treatment as before Lab Results  Component Value Date   WBC 3.9* 05/07/2011   HGB 12.8 05/07/2011   HCT 38.3 05/07/2011   PLT 156.0 05/07/2011   GLUCOSE 94 05/07/2011   CHOL 179 05/07/2011   TRIG 83.0 05/07/2011   HDL 85.30 05/07/2011   LDLCALC 77 05/07/2011   ALT 23 05/07/2011   AST 36 05/07/2011   NA 140 05/07/2011   K 4.9 05/07/2011   CL 100 05/07/2011   CREATININE 0.7 05/07/2011   BUN 19 05/07/2011   CO2 33* 05/07/2011   TSH 0.92 05/07/2011

## 2011-08-27 ENCOUNTER — Other Ambulatory Visit: Payer: Self-pay | Admitting: Internal Medicine

## 2011-08-27 MED ORDER — PREDNISONE 10 MG PO TABS: ORAL_TABLET | ORAL | Status: DC

## 2011-08-27 NOTE — Telephone Encounter (Signed)
Pt not formally seen today, but is here with husband with overt allergic eye conjunctival s/s and mild wheeze flare -   Ok for predpack asd, f/u as planned

## 2011-09-09 ENCOUNTER — Ambulatory Visit (INDEPENDENT_AMBULATORY_CARE_PROVIDER_SITE_OTHER)
Admission: RE | Admit: 2011-09-09 | Discharge: 2011-09-09 | Disposition: A | Discharge status: Home / Self Care | Payer: Medicare Other | Source: Ambulatory Visit | Attending: Internal Medicine | Admitting: Internal Medicine

## 2011-09-09 ENCOUNTER — Encounter: Payer: Self-pay | Admitting: Internal Medicine

## 2011-09-09 ENCOUNTER — Ambulatory Visit (INDEPENDENT_AMBULATORY_CARE_PROVIDER_SITE_OTHER): Payer: Medicare Other | Admitting: Internal Medicine

## 2011-09-09 VITALS — BP 132/78 | HR 57 | Temp 97.9°F | Ht 64.0 in | Wt 116.1 lb

## 2011-09-09 DIAGNOSIS — R079 Chest pain, unspecified: Secondary | ICD-10-CM | POA: Insufficient documentation

## 2011-09-09 DIAGNOSIS — F411 Generalized anxiety disorder: Secondary | ICD-10-CM

## 2011-09-09 DIAGNOSIS — R109 Unspecified abdominal pain: Secondary | ICD-10-CM

## 2011-09-09 DIAGNOSIS — I10 Essential (primary) hypertension: Secondary | ICD-10-CM

## 2011-09-09 DIAGNOSIS — R42 Dizziness and giddiness: Secondary | ICD-10-CM

## 2011-09-09 NOTE — Patient Instructions (Addendum)
Continue all other medications as before Your EKG was ok today Please go to XRAY in the Basement for the x-ray test You will be contacted by phone if any changes need to be made immediately.  Otherwise, you will receive a letter about your results with an explanation. Please return in March 2013 with Lab testing done 3-5 days before

## 2011-09-09 NOTE — Progress Notes (Signed)
Subjective:    Patient ID: Ann Booth, female    DOB: 1927/01/09, 78 y.o.   MRN: [id number]  HPI  Here to f/u; overall doing ok,  Pt denies increased sob or doe, wheezing, orthopnea, PND, increased LE swelling, palpitations, dizziness or syncope, but has had mild upper anterior chest pressure, mild intermittent without radiation, n/v, diaphoresis for several wks, is occasionally pleuritic in nature.   Pt denies new neurological symptoms such as new headache, or facial or extremity weakness or numbness   Pt denies polydipsia, polyuria,  Pt states overall good compliance with meds, trying to follow lower cholesterol diet, wt overall stable but little exercise however.  Did have elev BP about 164  A few wks ago but BP overall ok at < 140/90, checked by nurse at the hospital for her husband (currently has c diff colitis) who is at the wound center for the hyperbaric tx.   Has had crampy abd pain with ongoing stress of husbands illness with occasional watery stool, but Denies worsening reflux, vomiting, other bowel change or blood, wt loss.  Has occasinal vertigo as well with position change that seems worse with more abd pains.   Past Medical History  Diagnosis Date  . ALLERGIC RHINITIS   . ANXIETY   . ASTHMA   . BACK PAIN   . CATARACT NOS   . DEPRESSION   . GLAUCOMA NOS   . Hypercalcemia   . HYPERLIPIDEMIA   . MITRAL VALVE PROLAPSE   . OSTEOPOROSIS   . PERIPHERAL NEUROPATHY   . POSITIONAL VERTIGO   . Rosacea   . SHOULDER PAIN, BILATERAL   . HTN (hypertension)    Past Surgical History  Procedure Date  . Cataract extraction, bilateral   . Posterior chamber interocular lens implant   . Hemorrhoid surgery   .  ankle surgury     left ankle    reports that she has never smoked. She does not have any smokeless tobacco history on file. She reports that she does not drink alcohol or use illicit drugs. family history includes Cancer in her brother and sister. No Known Allergies Current  Outpatient Prescriptions on File Prior to Visit  Medication Sig Dispense Refill  . aspirin 81 MG tablet Take 81 mg by mouth daily.        . bimatoprost (LUMIGAN) 0.03 % ophthalmic solution Place 1 drop into both eyes daily.        . brimonidine (ALPHAGAN) 0.15 % ophthalmic solution One drop every night       . ALPRAZolam (XANAX) 0.25 MG tablet Take 0.5-1 tablets (0.125-0.25 mg total) by mouth 3 (three) times daily as needed for sleep or anxiety.  30 tablet  3  . losartan (COZAAR) 50 MG tablet Take 1 tablet (50 mg total) by mouth 2 (two) times daily.  180 tablet  3  . predniSONE (DELTASONE) 10 MG tablet 3 tabs by mouth per day for 3 days,2tabs per day for 3 days,1tab per day for 3 days  18 tablet  0  . DISCONTD: hydrochlorothiazide (,MICROZIDE/HYDRODIURIL,) 12.5 MG capsule Take 1 capsule (12.5 mg total) by mouth daily.  90 capsule  3   Review of Systems Review of Systems  Constitutional: Negative for diaphoresis and unexpected weight change.  HENT: Negative for tinnitus.   Eyes: Negative for photophobia and visual disturbance.  Respiratory: Negative for choking and stridor.   Gastrointestinal: Negative for vomiting and blood in stool.  Genitourinary: Negative for hematuria and decreased urine volume.  Musculoskeletal: Negative for gait problem.  Skin: Negative for color change and wound.  Neurological: Negative for tremors and numbness.     Objective:   Physical Exam BP 132/78  Pulse 57  Temp 97.9 F (36.6 C) (Oral)  Ht 5\' 4"  (1.626 m)  Wt 116 lb 2 oz (52.674 kg)  BMI 19.93 kg/m2  SpO2 98% Physical Exam  VS noted, not ill appearing Constitutional: Pt appears well-developed and well-nourished.  HENT: Head: Normocephalic.  Right Ear: External ear normal.  Left Ear: External ear normal.  Eyes: Conjunctivae and EOM are normal. Pupils are equal, round, and reactive to light.  Neck: Normal range of motion. Neck supple.  Cardiovascular: Normal rate and regular rhythm.     Pulmonary/Chest: Effort normal and breath sounds normal.  Abd:  Soft, NT, non-distended, + BS Neurological: Pt is alert. Not confused, motor/gait intact Skin: Skin is warm. No erythema. No rash Psychiatric: Pt behavior is normal. Thought content normal. 1-2+ nervous    Assessment & Plan:

## 2011-09-10 ENCOUNTER — Encounter: Payer: Self-pay | Admitting: Internal Medicine

## 2011-09-10 NOTE — Assessment & Plan Note (Addendum)
ECG reviewed as per emr, stable overall by hx and exam, most recent data reviewed with pt, and pt to continue medical treatment as before BP Readings from Last 3 Encounters:  09/09/11 132/78  07/28/11 102/62  07/17/11 158/70

## 2011-09-21 ENCOUNTER — Encounter: Payer: Self-pay | Admitting: Internal Medicine

## 2011-09-21 DIAGNOSIS — R109 Unspecified abdominal pain: Secondary | ICD-10-CM | POA: Insufficient documentation

## 2011-09-21 DIAGNOSIS — R42 Dizziness and giddiness: Secondary | ICD-10-CM | POA: Insufficient documentation

## 2011-09-21 NOTE — Assessment & Plan Note (Signed)
Positional, for meclizine prn,  to f/u any worsening symptoms or concerns 

## 2011-09-21 NOTE — Assessment & Plan Note (Signed)
stable overall by hx and exam, and pt to continue medical treatment as before, declines further tx at this time

## 2011-09-21 NOTE — Assessment & Plan Note (Signed)
Atypical, ECG reviewed as per emr, for cxr today, but I dont think she needs further eval

## 2011-09-21 NOTE — Assessment & Plan Note (Signed)
IBS like by hx, exam benign, afeb, declines further eval at this time

## 2011-10-06 ENCOUNTER — Telehealth: Payer: Self-pay

## 2011-10-06 DIAGNOSIS — R197 Diarrhea, unspecified: Secondary | ICD-10-CM

## 2011-10-06 NOTE — Telephone Encounter (Signed)
The patient left walk in note today.  She is concerned she may have C. diff from her husband.  Please advise if possible and if so what to do to be tested.  Call back numbers are [phone] or [phone].

## 2011-10-06 NOTE — Telephone Encounter (Signed)
Called the patient informed order has been put in the lab for test and to come at her convenience to do.

## 2011-10-06 NOTE — Telephone Encounter (Signed)
Ok for stool c diff study - done per emr

## 2011-10-07 ENCOUNTER — Telehealth: Payer: Self-pay

## 2011-10-07 ENCOUNTER — Other Ambulatory Visit: Payer: Medicare Other

## 2011-10-07 DIAGNOSIS — R197 Diarrhea, unspecified: Secondary | ICD-10-CM

## 2011-10-07 NOTE — Telephone Encounter (Signed)
Put order in

## 2011-10-13 ENCOUNTER — Encounter: Payer: Self-pay | Admitting: Internal Medicine

## 2011-10-25 ENCOUNTER — Ambulatory Visit (INDEPENDENT_AMBULATORY_CARE_PROVIDER_SITE_OTHER): Payer: Medicare Other | Admitting: Internal Medicine

## 2011-10-25 ENCOUNTER — Encounter: Payer: Self-pay | Admitting: Internal Medicine

## 2011-10-25 VITALS — BP 152/80 | HR 75 | Temp 97.6°F | Ht 64.0 in | Wt 113.0 lb

## 2011-10-25 DIAGNOSIS — K921 Melena: Secondary | ICD-10-CM | POA: Insufficient documentation

## 2011-10-25 DIAGNOSIS — R195 Other fecal abnormalities: Secondary | ICD-10-CM

## 2011-10-25 NOTE — Assessment & Plan Note (Addendum)
Probably from the pepto bismol and iron She has no fever or abdominal pain to make colitis a sig possibility She has had considerable possible exposure to C dif spores with husband's ongoing infection but no signs of illness now despite the mild change in her bowels Reassured her for now Discussed dilute chlorine cleaning of surfaces and continue handwashing precautions ?probiotic---she has for her husband and I suggested she start it also If symptoms increase, would check her stools for C dif or it might be reasonable to treat her empirically if she has classic symptoms due to her exposure

## 2011-10-25 NOTE — Progress Notes (Signed)
Subjective:    Patient ID: Ann Booth, female    DOB: [birth year], 78 y.o.   MRN: [id number]  HPI Husband is sick with C diff colitis Home for 2 weeks and she is caregiver  He is on flagyl for some time but refuses meds at times---generally only takes 1 flagyl per day Not eating well and she feels he is "fading away" Has been using gloves when cleaning up his bed, etc  Noted black stool, slightly malodorous stool today Noted loose stools yesterday Has been having 2 loose stools each morning over the last week Prior to the past week or so, she would have 1-2 stools per day but not loose  No abdominal pain "growls" a lot No red blood in stools No fever  Does take "E-Z iron" tabs Did use some pepto bismol yesterday  Current Outpatient Prescriptions on File Prior to Visit  Medication Sig Dispense Refill  . ALPRAZolam (XANAX) 0.25 MG tablet Take 0.5-1 tablets (0.125-0.25 mg total) by mouth 3 (three) times daily as needed for sleep or anxiety.  30 tablet  3  . aspirin 81 MG tablet Take 81 mg by mouth daily.        . bimatoprost (LUMIGAN) 0.03 % ophthalmic solution Place 1 drop into both eyes daily.        . brimonidine (ALPHAGAN) 0.15 % ophthalmic solution One drop every night       . losartan (COZAAR) 50 MG tablet Take 1 tablet (50 mg total) by mouth 2 (two) times daily.  180 tablet  3  . DISCONTD: hydrochlorothiazide (,MICROZIDE/HYDRODIURIL,) 12.5 MG capsule Take 1 capsule (12.5 mg total) by mouth daily.  90 capsule  3    No Known Allergies  Past Medical History  Diagnosis Date  . ALLERGIC RHINITIS   . ANXIETY   . ASTHMA   . BACK PAIN   . CATARACT NOS   . DEPRESSION   . GLAUCOMA NOS   . Hypercalcemia   . HYPERLIPIDEMIA   . MITRAL VALVE PROLAPSE   . OSTEOPOROSIS   . PERIPHERAL NEUROPATHY   . POSITIONAL VERTIGO   . Rosacea   . SHOULDER PAIN, BILATERAL   . HTN (hypertension)     Past Surgical History  Procedure Date  . Cataract extraction, bilateral   .  Posterior chamber interocular lens implant   . Hemorrhoid surgery   .  ankle surgury     left ankle    Family History  Problem Relation Age of Onset  . Cancer Sister     colon  . Cancer Brother     prostate cancer    History   Social History  . Marital Status: Married    Spouse Name: N/A    Number of Children: N/A  . Years of Education: N/A   Occupational History  . retired [employer]    Social History Main Topics  . Smoking status: Never Smoker   . Smokeless tobacco: Never Used  . Alcohol Use: No  . Drug Use: No  . Sexually Active: Not on file   Other Topics Concern  . Not on file   Social History Narrative  . No narrative on file   Review of Systems Has had hemorrhoidal banding twice No history of ulcer Appetite is stable Has lost a few pounds lately     Objective:   Physical Exam  Constitutional: She appears well-developed and well-nourished. No distress.  Neck: Normal range of motion. Neck supple.  Pulmonary/Chest: Effort normal  and breath sounds normal. No respiratory distress. She has no wheezes. She has no rales.  Abdominal: Soft. Bowel sounds are normal. She exhibits no distension. There is no tenderness. There is no rebound and no guarding.  Genitourinary:       Rectal exam shows no masses or tenderness Stool in vault is dark green/slight blackish          Assessment & Plan:

## 2011-10-31 ENCOUNTER — Ambulatory Visit (INDEPENDENT_AMBULATORY_CARE_PROVIDER_SITE_OTHER): Payer: Medicare Other | Admitting: General Practice

## 2011-10-31 DIAGNOSIS — Z23 Encounter for immunization: Secondary | ICD-10-CM

## 2012-04-19 ENCOUNTER — Telehealth: Payer: Self-pay

## 2012-04-19 DIAGNOSIS — I10 Essential (primary) hypertension: Secondary | ICD-10-CM

## 2012-04-19 MED ORDER — LOSARTAN POTASSIUM 50 MG PO TABS: 50.0000 mg | ORAL_TABLET | Freq: Two times a day (BID) | ORAL | Status: DC

## 2012-04-19 NOTE — Telephone Encounter (Signed)
Medication refill

## 2012-04-27 ENCOUNTER — Other Ambulatory Visit (INDEPENDENT_AMBULATORY_CARE_PROVIDER_SITE_OTHER): Payer: Medicare Other

## 2012-04-27 ENCOUNTER — Ambulatory Visit (INDEPENDENT_AMBULATORY_CARE_PROVIDER_SITE_OTHER): Payer: Medicare Other | Admitting: Internal Medicine

## 2012-04-27 ENCOUNTER — Encounter: Payer: Self-pay | Admitting: Internal Medicine

## 2012-04-27 VITALS — BP 110/78 | HR 58 | Temp 97.1°F | Ht 64.0 in | Wt 116.2 lb

## 2012-04-27 DIAGNOSIS — I6529 Occlusion and stenosis of unspecified carotid artery: Secondary | ICD-10-CM

## 2012-04-27 DIAGNOSIS — G8929 Other chronic pain: Secondary | ICD-10-CM | POA: Insufficient documentation

## 2012-04-27 DIAGNOSIS — Z0000 Encounter for general adult medical examination without abnormal findings: Secondary | ICD-10-CM

## 2012-04-27 DIAGNOSIS — M4120 Other idiopathic scoliosis, site unspecified: Secondary | ICD-10-CM

## 2012-04-27 DIAGNOSIS — I10 Essential (primary) hypertension: Secondary | ICD-10-CM

## 2012-04-27 DIAGNOSIS — M545 Low back pain: Secondary | ICD-10-CM | POA: Insufficient documentation

## 2012-04-27 HISTORY — DX: Occlusion and stenosis of unspecified carotid artery: I65.29

## 2012-04-27 LAB — CBC WITH DIFFERENTIAL/PLATELET
Basophils Relative: 1.5 % (ref 0.0–3.0)
Eosinophils Absolute: 0.1 10*3/uL (ref 0.0–0.7)
HCT: 37.2 % (ref 36.0–46.0)
Hemoglobin: 12.6 g/dL (ref 12.0–15.0)
MCHC: 33.7 g/dL (ref 30.0–36.0)
MCV: 95.7 fl (ref 78.0–100.0)
Monocytes Absolute: 0.4 10*3/uL (ref 0.1–1.0)
Neutro Abs: 1.6 10*3/uL (ref 1.4–7.7)
RBC: 3.89 Mil/uL (ref 3.87–5.11)

## 2012-04-27 LAB — URINALYSIS, ROUTINE W REFLEX MICROSCOPIC
Hgb urine dipstick: NEGATIVE
Ketones, ur: NEGATIVE
Urine Glucose: NEGATIVE
Urobilinogen, UA: 0.2 (ref 0.0–1.0)

## 2012-04-27 LAB — BASIC METABOLIC PANEL
BUN: 20 mg/dL (ref 6–23)
Calcium: 10.3 mg/dL (ref 8.4–10.5)
GFR: 83.32 mL/min (ref >60.00)
Glucose, Bld: 94 mg/dL (ref 70–99)
Sodium: 140 mEq/L (ref 135–145)

## 2012-04-27 LAB — LIPID PANEL
Cholesterol: 167 mg/dL (ref 0–200)
HDL: 107 mg/dL (ref >39.00)
LDL Cholesterol: 47 mg/dL (ref 0–99)
VLDL: 12.8 mg/dL (ref 0.0–40.0)

## 2012-04-27 LAB — HEPATIC FUNCTION PANEL
Albumin: 3.9 g/dL (ref 3.5–5.2)
Alkaline Phosphatase: 52 U/L (ref 39–117)
Total Protein: 7.2 g/dL (ref 6.0–8.3)

## 2012-04-27 MED ORDER — ALPRAZOLAM 0.5 MG PO TABS: 0.5000 mg | ORAL_TABLET | Freq: Every evening | ORAL | Status: DC | PRN

## 2012-04-27 MED ORDER — LOSARTAN POTASSIUM 50 MG PO TABS: 50.0000 mg | ORAL_TABLET | Freq: Every day | ORAL | Status: DC

## 2012-04-27 NOTE — Assessment & Plan Note (Signed)
stable overall by history and exam, recent data reviewed with pt, and pt to continue medical treatment as before,  to f/u any worsening symptoms or concerns BP Readings from Last 3 Encounters:  04/27/12 110/78  10/25/11 152/80  09/09/11 132/78

## 2012-04-27 NOTE — Assessment & Plan Note (Signed)

## 2012-04-27 NOTE — Progress Notes (Signed)
Subjective:    Patient ID: Ann Booth, female    DOB: 18-Oct-1926, 79 y.o.   MRN: [id number]  HPI  Here for wellness and f/u;  Overall doing ok;  Pt denies CP, worsening SOB, DOE, wheezing, orthopnea, PND, worsening LE edema, palpitations, dizziness or syncope.  Pt denies neurological change such as new headache, facial or extremity weakness.  Pt denies polydipsia, polyuria, or low sugar symptoms. Pt states overall good compliance with treatment and medications, good tolerability, and has been trying to follow lower cholesterol diet.  Pt denies worsening depressive symptoms, suicidal ideation or panic. No fever, night sweats, wt loss, loss of appetite, or other constitutional symptoms.  Pt states good ability with ADL's, has low fall risk, home safety reviewed and adequate, no other significant changes in hearing or vision, and only occasionally active with exercise. Losartan 50 bid made her dizzy, so went back to one per day.  Does better with xanax .5 qhs.   Pt continues to have recurring left LBP without change in severity, bowel or bladder change, fever, wt loss,  worsening LE pain/numbness/weakness, gait change or falls.  Past Medical History  Diagnosis Date  . ALLERGIC RHINITIS   . ANXIETY   . ASTHMA   . BACK PAIN   . CATARACT NOS   . DEPRESSION   . GLAUCOMA NOS   . Hypercalcemia   . HYPERLIPIDEMIA   . MITRAL VALVE PROLAPSE   . OSTEOPOROSIS   . PERIPHERAL NEUROPATHY   . POSITIONAL VERTIGO   . Rosacea   . SHOULDER PAIN, BILATERAL   . HTN (hypertension)   . Carotid stenosis 04/27/2012    Mild left on community screening  - Nov 2013   Past Surgical History  Procedure Laterality Date  . Cataract extraction, bilateral    . Posterior chamber interocular lens implant    . Hemorrhoid surgery    .  ankle surgury      left ankle    reports that she has never smoked. She has never used smokeless tobacco. She reports that she does not drink alcohol or use illicit drugs. family  history includes Cancer in her brother and sister. No Known Allergies Current Outpatient Prescriptions on File Prior to Visit  Medication Sig Dispense Refill  . aspirin 81 MG tablet Take 81 mg by mouth daily.        . bimatoprost (LUMIGAN) 0.03 % ophthalmic solution Place 1 drop into both eyes daily.        . brimonidine (ALPHAGAN) 0.15 % ophthalmic solution One drop every night       . [DISCONTINUED] hydrochlorothiazide (,MICROZIDE/HYDRODIURIL,) 12.5 MG capsule Take 1 capsule (12.5 mg total) by mouth daily.  90 capsule  3   No current facility-administered medications on file prior to visit.    Review of Systems Constitutional: Negative for diaphoresis, activity change, appetite change or unexpected weight change.  HENT: Negative for hearing loss, ear pain, facial swelling, mouth sores and neck stiffness.   Eyes: Negative for pain, redness and visual disturbance.  Respiratory: Negative for shortness of breath and wheezing.   Cardiovascular: Negative for chest pain and palpitations.  Gastrointestinal: Negative for diarrhea, blood in stool, abdominal distention or other pain Genitourinary: Negative for hematuria, flank pain or change in urine volume.  Musculoskeletal: Negative for myalgias and joint swelling.  Skin: Negative for color change and wound.  Neurological: Negative for syncope and numbness. other than noted Hematological: Negative for adenopathy.  Psychiatric/Behavioral: Negative for hallucinations, self-injury, decreased concentration  and agitation.      Objective:   Physical Exam BP 110/78  Pulse 58  Temp(Src) 97.1 F (36.2 C) (Oral)  Ht 5\' 4"  (1.626 m)  Wt 116 lb 4 oz (52.731 kg)  BMI 19.94 kg/m2  SpO2 97% VS noted,  Constitutional: Pt is oriented to person, place, and time. Appears well-developed and well-nourished.  Head: Normocephalic and atraumatic.  Right Ear: External ear normal.  Left Ear: External ear normal.  Nose: Nose normal.  Mouth/Throat:  Oropharynx is clear and moist.  Eyes: Conjunctivae and EOM are normal. Pupils are equal, round, and reactive to light.  Neck: Normal range of motion. Neck supple. No JVD present. No tracheal deviation present.  Cardiovascular: Normal rate, regular rhythm, normal heart sounds and intact distal pulses.   Pulmonary/Chest: Effort normal and breath sounds normal.  Abdominal: Soft. Bowel sounds are normal. There is no tenderness. No HSM  Musculoskeletal: Normal range of motion. Exhibits no edema. Large scolisis deformity thoracic/lumbar spine Lymphadenopathy:  Has no cervical adenopathy.  Neurological: Pt is alert and oriented to person, place, and time. Pt has normal reflexes. No cranial nerve deficit.  Skin: Skin is warm and dry. No rash noted.  Psychiatric:  Has  normal mood and affect. Behavior is normal.     Assessment & Plan:

## 2012-04-27 NOTE — Assessment & Plan Note (Signed)
stable overall by history and exam, and pt to continue medical treatment as before,  to f/u any worsening symptoms or concerns 

## 2012-04-27 NOTE — Patient Instructions (Addendum)
OK to continue the losartan at 50 mg per day only OK to take the xanax at .5 at night as needed Please continue all other medications as before Please have the pharmacy call with any other refills you may need. Please continue your efforts at being more active, low cholesterol diet, and weight control. You are otherwise up to date with prevention measures today. Please go to the LAB in the Basement (turn left off the elevator) for the tests to be done today You will be contacted by phone if any changes need to be made immediately.  Otherwise, you will receive a letter about your results with an explanation, but please check with MyChart first. Please remember to sign up for My Chart if you have not done so, as this will be important to you in the future with finding out test results, communicating by private email, and scheduling acute appointments online when needed. Please call if the back pain is worse, for an MRI or at least an orthopedic referral, as cortisone shots or physical therapy can be helpful if you are not wanting any surgury Please keep your appointments with your specialists as you have planned Please return in 6 months, or sooner if needed

## 2012-07-21 ENCOUNTER — Encounter: Payer: Self-pay | Admitting: Internal Medicine

## 2012-07-21 ENCOUNTER — Telehealth: Payer: Self-pay | Admitting: Internal Medicine

## 2012-07-21 ENCOUNTER — Ambulatory Visit (INDEPENDENT_AMBULATORY_CARE_PROVIDER_SITE_OTHER): Payer: Medicare Other | Admitting: Internal Medicine

## 2012-07-21 VITALS — BP 110/70 | HR 65 | Temp 97.6°F | Ht 64.0 in | Wt 120.4 lb

## 2012-07-21 DIAGNOSIS — J309 Allergic rhinitis, unspecified: Secondary | ICD-10-CM

## 2012-07-21 DIAGNOSIS — I10 Essential (primary) hypertension: Secondary | ICD-10-CM

## 2012-07-21 DIAGNOSIS — H109 Unspecified conjunctivitis: Secondary | ICD-10-CM

## 2012-07-21 DIAGNOSIS — J45909 Unspecified asthma, uncomplicated: Secondary | ICD-10-CM

## 2012-07-21 MED ORDER — PREDNISONE 10 MG PO TABS: ORAL_TABLET | ORAL | Status: DC

## 2012-07-21 MED ORDER — ERYTHROMYCIN 5 MG/GM OP OINT: TOPICAL_OINTMENT | OPHTHALMIC | Status: DC

## 2012-07-21 NOTE — Assessment & Plan Note (Signed)
With seasonal flare, for predpack asd,  to f/u any worsening symptoms or concerns  

## 2012-07-21 NOTE — Progress Notes (Signed)
Subjective:    Patient ID: Ann Booth, female    DOB: Jul 16, 1926, 79 y.o.   MRN: [id number]  HPI  Here to f/u, has hx of chronic ST for yrs, better for a few yrs, then worse this yr again; Does have several wks ongoing nasal allergy symptoms with clearish congestion, itch and sneezing,and ST without fever, cough, swelling or wheezing; also having eye symptoms with watery d/c, itching.  Has been doing yardwork daily for several days Pt denies chest pain, increased sob or doe, wheezing, orthopnea, PND, increased LE swelling, palpitations, dizziness or syncope.  Pt denies new neurological symptoms such as new headache, or facial or extremity weakness or numbness   Pt denies polydipsia, polyuria,  Past Medical History  Diagnosis Date  . ALLERGIC RHINITIS   . ANXIETY   . ASTHMA   . BACK PAIN   . CATARACT NOS   . DEPRESSION   . GLAUCOMA NOS   . Hypercalcemia   . HYPERLIPIDEMIA   . MITRAL VALVE PROLAPSE   . OSTEOPOROSIS   . PERIPHERAL NEUROPATHY   . POSITIONAL VERTIGO   . Rosacea   . SHOULDER PAIN, BILATERAL   . HTN (hypertension)   . Carotid stenosis 04/27/2012    Mild left on community screening  - Nov 2013   Past Surgical History  Procedure Laterality Date  . Cataract extraction, bilateral    . Posterior chamber interocular lens implant    . Hemorrhoid surgery    .  ankle surgury      left ankle    reports that she has never smoked. She has never used smokeless tobacco. She reports that she does not drink alcohol or use illicit drugs. family history includes Cancer in her brother and sister. No Known Allergies Current Outpatient Prescriptions on File Prior to Visit  Medication Sig Dispense Refill  . ALPRAZolam (XANAX) 0.5 MG tablet Take 1 tablet (0.5 mg total) by mouth at bedtime as needed for sleep.  90 tablet  0  . aspirin 81 MG tablet Take 81 mg by mouth daily.        . bimatoprost (LUMIGAN) 0.03 % ophthalmic solution Place 1 drop into both eyes daily.        .  brimonidine (ALPHAGAN) 0.15 % ophthalmic solution One drop every night       . losartan (COZAAR) 50 MG tablet Take 1 tablet (50 mg total) by mouth daily.  90 tablet  3  . [DISCONTINUED] hydrochlorothiazide (,MICROZIDE/HYDRODIURIL,) 12.5 MG capsule Take 1 capsule (12.5 mg total) by mouth daily.  90 capsule  3   No current facility-administered medications on file prior to visit.   Review of Systems  Constitutional: Negative for unexpected weight change, or unusual diaphoresis  HENT: Negative for tinnitus.   Eyes: Negative for photophobia and visual disturbance.  Respiratory: Negative for choking and stridor.   Gastrointestinal: Negative for vomiting and blood in stool.  Genitourinary: Negative for hematuria and decreased urine volume.  Musculoskeletal: Negative for acute joint swelling Skin: Negative for color change and wound.  Neurological: Negative for tremors and numbness other than noted  Psychiatric/Behavioral: Negative for decreased concentration or  hyperactivity.       Objective:   Physical Exam BP 110/70  Pulse 65  Temp(Src) 97.6 F (36.4 C) (Oral)  Ht 5\' 4"  (1.626 m)  Wt 120 lb 6 oz (54.602 kg)  BMI 20.65 kg/m2  SpO2 98% VS noted,  Constitutional: Pt appears well-developed and well-nourished.  HENT: Head: NCAT.  Right Ear: External ear normal.  Left Ear: External ear normal.  Eyes: Conjunctivae  With severe erythema/injection, and bilat upper and lower eyelid puffiness (nontender) ,and EOM are normal. Pupils are equal, round, and reactive to light.  Neck: Normal range of motion. Neck supple.  Cardiovascular: Normal rate and regular rhythm.   Pulmonary/Chest: Effort normal and breath sounds normal.  Neurological: Pt is alert. Not confused  Skin: Skin is warm. No erythema.  Psychiatric: Pt behavior is normal. Thought content normal.     Assessment & Plan:

## 2012-07-21 NOTE — Assessment & Plan Note (Signed)
prob allergic vs infecitous  - for emcyin opth oint asd,  to f/u any worsening symptoms or concerns

## 2012-07-21 NOTE — Assessment & Plan Note (Signed)
stable overall by history and exam, recent data reviewed with pt, and pt to continue medical treatment as before,  to f/u any worsening symptoms or concerns BP Readings from Last 3 Encounters:  07/21/12 110/70  04/27/12 110/78  10/25/11 152/80

## 2012-07-21 NOTE — Telephone Encounter (Signed)
Pt called stated that she was just here this morning for an appt and Dr. Jonny Ruiz said he would send in two rx to [facility]. Pt stated that she called her drug store and they told her they do not have any med for her there. Please check.

## 2012-07-21 NOTE — Assessment & Plan Note (Signed)
stable overall by history and exam, recent data reviewed with pt, and pt to continue medical treatment as before,  to f/u any worsening symptoms or concerns SpO2 Readings from Last 3 Encounters:  07/21/12 98%  04/27/12 97%  09/09/11 98%

## 2012-07-21 NOTE — Patient Instructions (Signed)
Please take all new medication as prescribed - the antibiotic for the eyes, and the prednisone for the allergies Please continue all other medications as before Please have the pharmacy call with any other refills you may need. You can also use Claritin or Allegra or even Nasacort OTC for help with allergies as well

## 2012-07-21 NOTE — Telephone Encounter (Signed)
Called the patient and the pharmacy did have her prescriptions ready for her.

## 2012-10-08 ENCOUNTER — Encounter: Payer: Self-pay | Admitting: Internal Medicine

## 2012-10-08 ENCOUNTER — Ambulatory Visit (INDEPENDENT_AMBULATORY_CARE_PROVIDER_SITE_OTHER): Payer: Medicare Other | Admitting: Internal Medicine

## 2012-10-08 VITALS — BP 142/70 | HR 90 | Temp 98.2°F | Ht 62.0 in | Wt 119.1 lb

## 2012-10-08 DIAGNOSIS — J309 Allergic rhinitis, unspecified: Secondary | ICD-10-CM

## 2012-10-08 DIAGNOSIS — E785 Hyperlipidemia, unspecified: Secondary | ICD-10-CM

## 2012-10-08 DIAGNOSIS — Z0000 Encounter for general adult medical examination without abnormal findings: Secondary | ICD-10-CM

## 2012-10-08 DIAGNOSIS — M25569 Pain in unspecified knee: Secondary | ICD-10-CM

## 2012-10-08 DIAGNOSIS — F411 Generalized anxiety disorder: Secondary | ICD-10-CM

## 2012-10-08 DIAGNOSIS — M25562 Pain in left knee: Secondary | ICD-10-CM

## 2012-10-08 DIAGNOSIS — G8929 Other chronic pain: Secondary | ICD-10-CM | POA: Insufficient documentation

## 2012-10-08 DIAGNOSIS — R002 Palpitations: Secondary | ICD-10-CM

## 2012-10-08 DIAGNOSIS — R000 Tachycardia, unspecified: Secondary | ICD-10-CM

## 2012-10-08 MED ORDER — ALPRAZOLAM 0.5 MG PO TABS: 0.2500 mg | ORAL_TABLET | Freq: Two times a day (BID) | ORAL | Status: DC | PRN

## 2012-10-08 MED ORDER — METHYLPREDNISOLONE ACETATE 80 MG/ML IJ SUSP
80.0000 mg | Freq: Once | INTRAMUSCULAR | Status: AC
  Administered 2012-10-08: 80 mg via INTRAMUSCULAR

## 2012-10-08 MED ORDER — BRINZOLAMIDE-BRIMONIDINE 1-0.2 % OP SUSP: 1.0000 "application " | Freq: Two times a day (BID) | OPHTHALMIC | Status: DC

## 2012-10-08 MED ORDER — BIMATOPROST 0.03 % OP SOLN: 1.0000 [drp] | Freq: Every day | OPHTHALMIC | Status: DC

## 2012-10-08 NOTE — Patient Instructions (Addendum)
You had the steroid shot today Your EKG was OK today Please change the alprazolam (xanax) to .25 mg twice per day as needed Please continue all other medications as before, and refills have been done if requested. Please have the pharmacy call with any other refills you may need. No further blood work needed today You will be contacted regarding the referral for: Event Monitor for the heart, and referral to Cardiology  Please keep a pillow between the knees with sleeping on your side, as this will reduce the pain at night  Please keep your appointments with your specialists as you may have planned  OK to cancel the appt for September 2014  Please return in 6 months, or sooner if needed, with Lab testing done 3-5 days before

## 2012-10-08 NOTE — Assessment & Plan Note (Addendum)
With occas racing heart - and bradycardic today  - ? Tachy brady, ECG reviewed as per emr - Sinus brady (no heart block) - no acute, for event monitor, refer cardiology  Note:  Total time for pt hx, exam, review of record with pt in the room, determination of diagnoses and plan for further eval and tx is > 40 min, with over 50% spent in coordination and counseling of patient

## 2012-10-08 NOTE — Progress Notes (Signed)
Subjective:    Patient ID: Ann Booth, female    DOB: Oct 11, 1926, 79 y.o.   MRN: [id number]  HPI  Here to f/u; overall doing ok,  Pt denies chest pain, increased sob or doe, wheezing, orthopnea, PND, increased LE swelling,  dizziness or syncope but has had several episodes of heart racing she thinks may be related to stress, but happens randomly as well.  And did have an episode of chest tightness mid upper chest without radiation or assoc symptoms with the first episode she noticed, lasted several seconds, did not seem to persist, did not seek medical attention  Pt denies polydipsia, polyuria, or low sugar symptoms such as weakness or confusion improved with po intake.  Pt denies new neurological symptoms such as new headache, or facial or extremity weakness or numbness.   Pt states overall good compliance with meds, has been trying to follow lower cholesterol diet, with wt overall stable.  Denies worsening depressive symptoms, suicidal ideation, or panic; has ongoing anxiety, with increased recently with mult social stressors not able to be reduced.  Does have several wks ongoing nasal allergy symptoms with clearish congestion, itch and sneezing, without fever, pain, ST, cough, swelling or wheezing.   Does have fatigue with her recent eye drops, and now changed 2 days ago per optho, not yet started .    Aldo with left knee pain, medial, x 2 wks, worse to walk the dog, keeps her from getting to sleep, icy hot helps, has not tried the pillow between knees at night, no swelling or giveaways, does not bother her with usual walking during the day.  Declines further mamogram, pelvic or pap.   Past Medical History  Diagnosis Date  . ALLERGIC RHINITIS   . ANXIETY   . ASTHMA   . BACK PAIN   . CATARACT NOS   . DEPRESSION   . GLAUCOMA NOS   . Hypercalcemia   . HYPERLIPIDEMIA   . MITRAL VALVE PROLAPSE   . OSTEOPOROSIS   . PERIPHERAL NEUROPATHY   . POSITIONAL VERTIGO   . Rosacea   . SHOULDER  PAIN, BILATERAL   . HTN (hypertension)   . Carotid stenosis 04/27/2012    Mild left on community screening  - Nov 2013   Past Surgical History  Procedure Laterality Date  . Cataract extraction, bilateral    . Posterior chamber interocular lens implant    . Hemorrhoid surgery    .  ankle surgury      left ankle    reports that she has never smoked. She has never used smokeless tobacco. She reports that she does not drink alcohol or use illicit drugs. family history includes Cancer in her brother and sister. No Known Allergies Current Outpatient Prescriptions on File Prior to Visit  Medication Sig Dispense Refill  . aspirin 81 MG tablet Take 81 mg by mouth daily.        Marland Kitchen erythromycin ophthalmic ointment Use as directed to both eyes four times per day for 10 days  3.5 g  0  . losartan (COZAAR) 50 MG tablet Take 1 tablet (50 mg total) by mouth daily.  90 tablet  3  . [DISCONTINUED] hydrochlorothiazide (,MICROZIDE/HYDRODIURIL,) 12.5 MG capsule Take 1 capsule (12.5 mg total) by mouth daily.  90 capsule  3   No current facility-administered medications on file prior to visit.    Review of Systems  Constitutional: Negative for unexpected weight change, or unusual diaphoresis  HENT: Negative for tinnitus.  Eyes: Negative for photophobia and visual disturbance.  Respiratory: Negative for choking and stridor.   Gastrointestinal: Negative for vomiting and blood in stool.  Genitourinary: Negative for hematuria and decreased urine volume.  Musculoskeletal: Negative for acute joint swelling Skin: Negative for color change and wound.  Neurological: Negative for tremors and numbness other than noted  Psychiatric/Behavioral: Negative for decreased concentration or  hyperactivity.       Objective:   Physical Exam BP 142/70  Pulse 90  Temp(Src) 98.2 F (36.8 C) (Oral)  Ht 5\' 2"  (1.575 m)  Wt 119 lb 2 oz (54.035 kg)  BMI 21.78 kg/m2  SpO2 97% VS noted,  Constitutional: Pt appears  well-developed and well-nourished.  HENT: Head: NCAT.  Right Ear: External ear normal.  Left Ear: External ear normal.  Eyes: Conjunctivae and EOM are normal. Pupils are equal, round, and reactive to light.  Neck: Normal range of motion. Neck supple.  Cardiovascular: Normal rate and regular rhythm.   Pulmonary/Chest: Effort normal and breath sounds normal.  Abd:  Soft, NT, non-distended, + BS Neurological: Pt is alert. Not confused  Skin: Skin is warm. No erythema.  Psychiatric: Pt behavior is normal. Thought content normal.  Left knee with FROm, NT, mild crepitus, and tender over medial joint line    Assessment & Plan:

## 2012-10-10 NOTE — Assessment & Plan Note (Signed)
Mild to mod, for depomedrol IM today,  to f/u any worsening symptoms or concerns

## 2012-10-10 NOTE — Assessment & Plan Note (Signed)
For simply pillow between knees at night, tylenol prn,  to f/u any worsening symptoms or concerns

## 2012-10-10 NOTE — Assessment & Plan Note (Signed)
Ok for xanax slight increae to .25 bid prn, declines cousnling, verified nonsuicidal

## 2012-10-10 NOTE — Assessment & Plan Note (Signed)
stable overall by history and exam, recent data reviewed with pt, and pt to continue medical treatment as before,  to f/u any worsening symptoms or concerns Lab Results  Component Value Date   LDLCALC 47 04/27/2012

## 2012-10-12 ENCOUNTER — Ambulatory Visit (INDEPENDENT_AMBULATORY_CARE_PROVIDER_SITE_OTHER): Payer: Medicare Other | Admitting: Cardiology

## 2012-10-12 ENCOUNTER — Ambulatory Visit (INDEPENDENT_AMBULATORY_CARE_PROVIDER_SITE_OTHER): Payer: Medicare Other

## 2012-10-12 ENCOUNTER — Other Ambulatory Visit: Payer: Self-pay

## 2012-10-12 ENCOUNTER — Encounter: Payer: Self-pay | Admitting: Cardiology

## 2012-10-12 VITALS — BP 165/62 | HR 59 | Ht 62.0 in | Wt 116.0 lb

## 2012-10-12 DIAGNOSIS — R002 Palpitations: Secondary | ICD-10-CM

## 2012-10-12 DIAGNOSIS — I059 Rheumatic mitral valve disease, unspecified: Secondary | ICD-10-CM

## 2012-10-12 DIAGNOSIS — I10 Essential (primary) hypertension: Secondary | ICD-10-CM

## 2012-10-12 DIAGNOSIS — R000 Tachycardia, unspecified: Secondary | ICD-10-CM

## 2012-10-12 NOTE — Assessment & Plan Note (Signed)
Dr. Jonny Ruiz has ordered a monitor. We will await results.

## 2012-10-12 NOTE — Assessment & Plan Note (Signed)
Patient with history of mitral valve prolapse/mitral regurgitation and significant murmur on examination. Repeat echocardiogram.

## 2012-10-12 NOTE — Progress Notes (Signed)
Placed a ecardio 14 day event monitor on patient

## 2012-10-12 NOTE — Assessment & Plan Note (Signed)
Continue present medications. 

## 2012-10-12 NOTE — Progress Notes (Signed)
HPI: 79 year old female for evaluation of palpitations. Echocardiogram in June of 2012 showed normal LV function and moderate mitral regurgitation. Patient denies dyspnea on exertion, orthopnea, PND, pedal edema, exertional chest pain or syncope. Several weeks ago she developed chest tightness while lying flat similar to her symptoms with anxiety attacks in the past by her report. She also recently had an episode of palpitations that lasted approximately 5 minutes and resolved. Because of the above we were asked to evaluate.  Current Outpatient Prescriptions  Medication Sig Dispense Refill  . ALPRAZolam (XANAX) 0.5 MG tablet Take 0.5 tablets (0.25 mg total) by mouth 2 (two) times daily as needed.  60 tablet  1  . aspirin 81 MG tablet Take 81 mg by mouth daily.        . AZOPT 1 % ophthalmic suspension as directed.      . bimatoprost (LUMIGAN) 0.03 % ophthalmic solution Place 1 drop into both eyes daily.  2.5 mL  11  . Brinzolamide-Brimonidine (SIMBRINZA) 1-0.2 % SUSP Apply 1 application to eye 2 (two) times daily.  8 mL  5  . losartan (COZAAR) 50 MG tablet Take 1 tablet (50 mg total) by mouth daily.  90 tablet  3  . [DISCONTINUED] hydrochlorothiazide (,MICROZIDE/HYDRODIURIL,) 12.5 MG capsule Take 1 capsule (12.5 mg total) by mouth daily.  90 capsule  3   No current facility-administered medications for this visit.    No Known Allergies  Past Medical History  Diagnosis Date  . ALLERGIC RHINITIS   . ANXIETY   . ASTHMA   . BACK PAIN   . CATARACT NOS   . DEPRESSION   . GLAUCOMA NOS   . Hypercalcemia   . MITRAL VALVE PROLAPSE   . OSTEOPOROSIS   . PERIPHERAL NEUROPATHY   . POSITIONAL VERTIGO   . Rosacea   . HTN (hypertension)   . Carotid stenosis 04/27/2012    Mild left on community screening  - Nov 2013    Past Surgical History  Procedure Laterality Date  . Cataract extraction, bilateral    . Posterior chamber interocular lens implant    . Hemorrhoid surgery    .  ankle surgury        left ankle    History   Social History  . Marital Status: Widowed    Spouse Name: N/A    Number of Children: N/A  . Years of Education: N/A   Occupational History  . retired [employer]    Social History Main Topics  . Smoking status: Never Smoker   . Smokeless tobacco: Never Used  . Alcohol Use: No  . Drug Use: No  . Sexual Activity: Not on file   Other Topics Concern  . Not on file   Social History Narrative  . No narrative on file    Family History  Problem Relation Age of Onset  . Cancer Sister     colon  . Cancer Brother     prostate cancer  . CAD Mother     MI at age 42    ROS: no fevers or chills, productive cough, hemoptysis, dysphasia, odynophagia, melena, hematochezia, dysuria, hematuria, rash, seizure activity, orthopnea, PND, pedal edema, claudication. Remaining systems are negative.  Physical Exam:   Blood pressure 165/62, pulse 59, height 5\' 2"  (1.575 m), weight 116 lb (52.617 kg).  General:  Well developed/well nourished in NAD Skin warm/dry Patient not depressed No peripheral clubbing Back-normal HEENT-normal/normal eyelids Neck supple/normal carotid upstroke bilaterally; no bruits; no JVD; no  thyromegaly chest - CTA/ normal expansion CV - RRR/normal S1 and S2; no rubs or gallops;  PMI nondisplaced, 2/6 systolic murmur apex Abdomen -NT/ND, no HSM, no mass, + bowel sounds, no bruit 2+ femoral pulses, no bruits Ext-no edema, chords, 2+ DP Neuro-grossly nonfocal  ECG  10/08/12 Sinus with PACs

## 2012-10-12 NOTE — Patient Instructions (Addendum)
Your physician recommends that you schedule a follow-up appointment in: 3 MONTHS WITH DR CRENSHAW  Your physician has requested that you have an echocardiogram. Echocardiography is a painless test that uses sound waves to create images of your heart. It provides your doctor with information about the size and shape of your heart and how well your heart's chambers and valves are working. This procedure takes approximately one hour. There are no restrictions for this procedure.    

## 2012-10-14 ENCOUNTER — Telehealth: Payer: Self-pay | Admitting: Cardiology

## 2012-10-14 NOTE — Telephone Encounter (Signed)
New problem   Pt need to know if she need antibiotic before dental surgery. Please call pt.

## 2012-10-14 NOTE — Telephone Encounter (Signed)
Left message for pt to call.

## 2012-10-14 NOTE — Telephone Encounter (Signed)
F/u ° ° °Pt returning your call °

## 2012-10-14 NOTE — Telephone Encounter (Signed)
Spoke with pt, aware she does not need antibiotics prior to dental work

## 2012-10-19 ENCOUNTER — Ambulatory Visit (HOSPITAL_COMMUNITY): Payer: Medicare Other | Attending: Cardiovascular Disease | Admitting: Radiology

## 2012-10-19 DIAGNOSIS — R002 Palpitations: Secondary | ICD-10-CM

## 2012-10-19 DIAGNOSIS — R5381 Other malaise: Secondary | ICD-10-CM | POA: Insufficient documentation

## 2012-10-19 DIAGNOSIS — J45909 Unspecified asthma, uncomplicated: Secondary | ICD-10-CM | POA: Insufficient documentation

## 2012-10-19 DIAGNOSIS — G609 Hereditary and idiopathic neuropathy, unspecified: Secondary | ICD-10-CM | POA: Insufficient documentation

## 2012-10-19 DIAGNOSIS — I379 Nonrheumatic pulmonary valve disorder, unspecified: Secondary | ICD-10-CM | POA: Insufficient documentation

## 2012-10-19 DIAGNOSIS — I10 Essential (primary) hypertension: Secondary | ICD-10-CM | POA: Insufficient documentation

## 2012-10-19 DIAGNOSIS — R609 Edema, unspecified: Secondary | ICD-10-CM | POA: Insufficient documentation

## 2012-10-19 DIAGNOSIS — R011 Cardiac murmur, unspecified: Secondary | ICD-10-CM

## 2012-10-19 DIAGNOSIS — F411 Generalized anxiety disorder: Secondary | ICD-10-CM | POA: Insufficient documentation

## 2012-10-19 DIAGNOSIS — E785 Hyperlipidemia, unspecified: Secondary | ICD-10-CM | POA: Insufficient documentation

## 2012-10-19 DIAGNOSIS — I059 Rheumatic mitral valve disease, unspecified: Secondary | ICD-10-CM | POA: Insufficient documentation

## 2012-10-19 DIAGNOSIS — I079 Rheumatic tricuspid valve disease, unspecified: Secondary | ICD-10-CM | POA: Insufficient documentation

## 2012-10-19 DIAGNOSIS — I6529 Occlusion and stenosis of unspecified carotid artery: Secondary | ICD-10-CM | POA: Insufficient documentation

## 2012-10-19 NOTE — Progress Notes (Signed)
Echocardiogram performed.  

## 2012-10-24 ENCOUNTER — Telehealth: Payer: Self-pay | Admitting: Internal Medicine

## 2012-10-24 NOTE — Telephone Encounter (Signed)
Called patient after she was noted to have atrial fibrillation on monitor. There was no answer.

## 2012-10-26 ENCOUNTER — Ambulatory Visit: Payer: Medicare Other | Admitting: Internal Medicine

## 2012-10-27 ENCOUNTER — Other Ambulatory Visit: Payer: Self-pay | Admitting: *Deleted

## 2012-10-27 ENCOUNTER — Telehealth: Payer: Self-pay | Admitting: *Deleted

## 2012-10-27 MED ORDER — METOPROLOL SUCCINATE ER 25 MG PO TB24: 25.0000 mg | ORAL_TABLET | Freq: Every day | ORAL | Status: DC

## 2012-10-27 MED ORDER — APIXABAN 2.5 MG PO TABS: 2.5000 mg | ORAL_TABLET | Freq: Two times a day (BID) | ORAL | Status: DC

## 2012-10-27 NOTE — Telephone Encounter (Signed)
E-cardio monitor shows atrial fib. Per dr Jens Som the pt will stop asa, she will start eliquis 2.5 mg twice daily and metoprolol succ 25 mg once daily. Follow up appt scheduled.

## 2012-10-28 ENCOUNTER — Telehealth: Payer: Self-pay | Admitting: Cardiology

## 2012-10-28 NOTE — Telephone Encounter (Signed)
Left message for pt to call.

## 2012-10-28 NOTE — Telephone Encounter (Signed)
Follow Up ° ° °Pt returning call from earlier. Please call back. °

## 2012-10-28 NOTE — Telephone Encounter (Signed)
Pt having problem with new med

## 2012-10-28 NOTE — Telephone Encounter (Signed)
Spoke with pt, she reports the eliquis needs prior auth. Called and Berkley Harvey okay for one year. Pt and pharm made aware

## 2012-11-01 ENCOUNTER — Encounter: Payer: Self-pay | Admitting: Cardiology

## 2012-11-01 ENCOUNTER — Ambulatory Visit (INDEPENDENT_AMBULATORY_CARE_PROVIDER_SITE_OTHER): Payer: Medicare Other | Admitting: Cardiology

## 2012-11-01 VITALS — BP 128/82 | HR 40 | Ht 62.0 in | Wt 117.0 lb

## 2012-11-01 DIAGNOSIS — I059 Rheumatic mitral valve disease, unspecified: Secondary | ICD-10-CM

## 2012-11-01 DIAGNOSIS — I4891 Unspecified atrial fibrillation: Secondary | ICD-10-CM | POA: Insufficient documentation

## 2012-11-01 DIAGNOSIS — I10 Essential (primary) hypertension: Secondary | ICD-10-CM

## 2012-11-01 NOTE — Progress Notes (Signed)
   HPI: Pleasant female for fu of atrial fibrillation. Echocardiogram in June of 2012 showed normal LV function and moderate mitral regurgitation. I saw her initially in August of 2014 for palpitations. Monitor in August of 2014 showed PAF. Echo in August of 2014 showed normal LV function and trivial MR; mild LAE. Since I last saw her, she denies dyspnea, chest pain, palpitations, syncope or bleeding. She has noticed that she is falling asleep in the middle of the day which is unusual.   Current Outpatient Prescriptions  Medication Sig Dispense Refill  . ALPRAZolam (XANAX) 0.5 MG tablet Take 0.5 tablets (0.25 mg total) by mouth 2 (two) times daily as needed.  60 tablet  1  . apixaban (ELIQUIS) 2.5 MG TABS tablet Take 1 tablet (2.5 mg total) by mouth 2 (two) times daily.  60 tablet  12  . AZOPT 1 % ophthalmic suspension as directed.      . Brinzolamide-Brimonidine (SIMBRINZA) 1-0.2 % SUSP Apply 1 application to eye 2 (two) times daily.  8 mL  5  . losartan (COZAAR) 50 MG tablet Take 1 tablet (50 mg total) by mouth daily.  90 tablet  3  . [DISCONTINUED] hydrochlorothiazide (,MICROZIDE/HYDRODIURIL,) 12.5 MG capsule Take 1 capsule (12.5 mg total) by mouth daily.  90 capsule  3   No current facility-administered medications for this visit.     Past Medical History  Diagnosis Date  . ALLERGIC RHINITIS   . ANXIETY   . ASTHMA   . BACK PAIN   . CATARACT NOS   . DEPRESSION   . GLAUCOMA NOS   . Hypercalcemia   . MITRAL VALVE PROLAPSE   . OSTEOPOROSIS   . PERIPHERAL NEUROPATHY   . POSITIONAL VERTIGO   . Rosacea   . HTN (hypertension)   . Carotid stenosis 04/27/2012    Mild left on community screening  - Nov 2013  . Atrial fibrillation     Past Surgical History  Procedure Laterality Date  . Cataract extraction, bilateral    . Posterior chamber interocular lens implant    . Hemorrhoid surgery    .  ankle surgury      left ankle    History   Social History  . Marital Status: Widowed      Spouse Name: N/A    Number of Children: N/A  . Years of Education: N/A   Occupational History  . retired [employer]    Social History Main Topics  . Smoking status: Never Smoker   . Smokeless tobacco: Never Used  . Alcohol Use: No  . Drug Use: No  . Sexual Activity: Not on file   Other Topics Concern  . Not on file   Social History Narrative  . No narrative on file    ROS: no fevers or chills, productive cough, hemoptysis, dysphasia, odynophagia, melena, hematochezia, dysuria, hematuria, rash, seizure activity, orthopnea, PND, pedal edema, claudication. Remaining systems are negative.  Physical Exam: Well-developed well-nourished in no acute distress.  Skin is warm and dry.  HEENT is normal.  Neck is supple.  Chest is clear to auscultation with normal expansion.  Cardiovascular exam is regular rate and rhythm. 2/6 systolic murmur apex. Abdominal exam nontender or distended. No masses palpated. Extremities show no edema. neuro grossly intact  ECG marked sinus bradycardia at a rate of 40. First degree AV block. No ST changes.

## 2012-11-01 NOTE — Assessment & Plan Note (Signed)
Patient was found to have paroxysmal atrial fibrillation on her monitor. Toprol was added but she is markedly bradycardic this morning. She is having fatigue and increased sleeping during the day. Discontinue Toprol and follow. She has embolic risk factors of age greater than 19 and hypertension. Continue apixaban. In 4 weeks we will check her renal function and hemoglobin.

## 2012-11-01 NOTE — Assessment & Plan Note (Signed)
Blood pressure controlled. Continue Cozaar.

## 2012-11-01 NOTE — Patient Instructions (Signed)
Your physician has recommended you make the following change in your medication:  1) Stop toprol (metoprolol).  Your physician recommends that you return for lab work in: 4 weeks- bmp/cbc.  Your physician wants you to follow-up in: 6 months with Dr. Jens Som. You will receive a reminder letter in the mail two months in advance. If you don't receive a letter, please call our office to schedule the follow-up appointment.

## 2012-11-01 NOTE — Assessment & Plan Note (Signed)
I reviewed her most recent echocardiogram today. There appears to be prolapse of the anterior leaflet. The mitral regurgitation appears mild but may be underestimated due to eccentric nature of jet. Plan repeat echocardiogram in one year.

## 2012-11-25 ENCOUNTER — Other Ambulatory Visit: Payer: Self-pay

## 2012-11-25 MED ORDER — APIXABAN 2.5 MG PO TABS: 2.5000 mg | ORAL_TABLET | Freq: Two times a day (BID) | ORAL | Status: DC

## 2012-11-29 ENCOUNTER — Other Ambulatory Visit (INDEPENDENT_AMBULATORY_CARE_PROVIDER_SITE_OTHER): Payer: Medicare Other

## 2012-11-29 DIAGNOSIS — I4891 Unspecified atrial fibrillation: Secondary | ICD-10-CM

## 2012-11-29 LAB — CBC WITH DIFFERENTIAL/PLATELET
Basophils Relative: 0.8 % (ref 0.0–3.0)
Eosinophils Absolute: 0.1 10*3/uL (ref 0.0–0.7)
Lymphocytes Relative: 27.4 % (ref 12.0–46.0)
MCHC: 33.9 g/dL (ref 30.0–36.0)
MCV: 95.2 fl (ref 78.0–100.0)
Monocytes Absolute: 0.5 10*3/uL (ref 0.1–1.0)
Neutrophils Relative %: 59 % (ref 43.0–77.0)
Platelets: 172 10*3/uL (ref 150.0–400.0)
RBC: 3.61 Mil/uL — ABNORMAL LOW (ref 3.87–5.11)
WBC: 4.8 10*3/uL (ref 4.5–10.5)

## 2012-11-29 LAB — BASIC METABOLIC PANEL
BUN: 23 mg/dL (ref 6–23)
CO2: 27 mEq/L (ref 19–32)
Calcium: 9.6 mg/dL (ref 8.4–10.5)
Chloride: 105 mEq/L (ref 96–112)
Creatinine, Ser: 0.8 mg/dL (ref 0.4–1.2)

## 2012-12-01 ENCOUNTER — Telehealth: Payer: Self-pay | Admitting: Cardiology

## 2012-12-01 ENCOUNTER — Other Ambulatory Visit: Payer: Self-pay

## 2012-12-01 MED ORDER — APIXABAN 2.5 MG PO TABS: 2.5000 mg | ORAL_TABLET | Freq: Two times a day (BID) | ORAL | Status: DC

## 2012-12-01 NOTE — Telephone Encounter (Signed)
New Problem:  Ann Booth States She received a fax from [facility] for an Rx for eliquis but it is blank. Ann Booth states [facility] needs to know the dosage/strength, more info...   Fax # is [phone]

## 2012-12-07 ENCOUNTER — Other Ambulatory Visit: Payer: Self-pay | Admitting: Internal Medicine

## 2013-01-14 ENCOUNTER — Ambulatory Visit: Payer: Medicare Other | Admitting: Cardiology

## 2013-01-23 ENCOUNTER — Emergency Department (HOSPITAL_COMMUNITY)
Admission: EM | Admit: 2013-01-23 | Discharge: 2013-01-24 | Disposition: A | Discharge status: Home / Self Care | Payer: Medicare Other | Attending: Emergency Medicine | Admitting: Emergency Medicine

## 2013-01-23 ENCOUNTER — Encounter (HOSPITAL_COMMUNITY): Payer: Self-pay | Admitting: Emergency Medicine

## 2013-01-23 DIAGNOSIS — M810 Age-related osteoporosis without current pathological fracture: Secondary | ICD-10-CM | POA: Insufficient documentation

## 2013-01-23 DIAGNOSIS — R42 Dizziness and giddiness: Secondary | ICD-10-CM | POA: Insufficient documentation

## 2013-01-23 DIAGNOSIS — Z7902 Long term (current) use of antithrombotics/antiplatelets: Secondary | ICD-10-CM | POA: Insufficient documentation

## 2013-01-23 DIAGNOSIS — F329 Major depressive disorder, single episode, unspecified: Secondary | ICD-10-CM | POA: Insufficient documentation

## 2013-01-23 DIAGNOSIS — Z8669 Personal history of other diseases of the nervous system and sense organs: Secondary | ICD-10-CM | POA: Insufficient documentation

## 2013-01-23 DIAGNOSIS — J45909 Unspecified asthma, uncomplicated: Secondary | ICD-10-CM | POA: Insufficient documentation

## 2013-01-23 DIAGNOSIS — F411 Generalized anxiety disorder: Secondary | ICD-10-CM | POA: Insufficient documentation

## 2013-01-23 DIAGNOSIS — Z872 Personal history of diseases of the skin and subcutaneous tissue: Secondary | ICD-10-CM | POA: Insufficient documentation

## 2013-01-23 DIAGNOSIS — I4891 Unspecified atrial fibrillation: Secondary | ICD-10-CM | POA: Insufficient documentation

## 2013-01-23 DIAGNOSIS — Z862 Personal history of diseases of the blood and blood-forming organs and certain disorders involving the immune mechanism: Secondary | ICD-10-CM | POA: Insufficient documentation

## 2013-01-23 DIAGNOSIS — I10 Essential (primary) hypertension: Secondary | ICD-10-CM | POA: Insufficient documentation

## 2013-01-23 DIAGNOSIS — F3289 Other specified depressive episodes: Secondary | ICD-10-CM | POA: Insufficient documentation

## 2013-01-23 DIAGNOSIS — Z79899 Other long term (current) drug therapy: Secondary | ICD-10-CM | POA: Insufficient documentation

## 2013-01-23 DIAGNOSIS — Z8639 Personal history of other endocrine, nutritional and metabolic disease: Secondary | ICD-10-CM | POA: Insufficient documentation

## 2013-01-23 LAB — CBC WITH DIFFERENTIAL/PLATELET
Basophils Absolute: 0 10*3/uL (ref 0.0–0.1)
Basophils Relative: 1 % (ref 0–1)
Eosinophils Absolute: 0.1 10*3/uL (ref 0.0–0.7)
Eosinophils Relative: 3 % (ref 0–5)
HCT: 35.7 % — ABNORMAL LOW (ref 36.0–46.0)
MCHC: 34.7 g/dL (ref 30.0–36.0)
MCV: 94.7 fL (ref 78.0–100.0)
Monocytes Absolute: 0.4 10*3/uL (ref 0.1–1.0)
Neutro Abs: 1.4 10*3/uL — ABNORMAL LOW (ref 1.7–7.7)
Platelets: 162 10*3/uL (ref 150–400)
RDW: 13.4 % (ref 11.5–15.5)

## 2013-01-23 LAB — COMPREHENSIVE METABOLIC PANEL
ALT: 23 U/L (ref 0–35)
Alkaline Phosphatase: 64 U/L (ref 39–117)
BUN: 22 mg/dL (ref 6–23)
CO2: 26 mEq/L (ref 19–32)
Chloride: 101 mEq/L (ref 96–112)
Creatinine, Ser: 0.72 mg/dL (ref 0.50–1.10)
GFR calc Af Amer: 89 mL/min — ABNORMAL LOW (ref >90)
GFR calc non Af Amer: 77 mL/min — ABNORMAL LOW (ref >90)
Glucose, Bld: 124 mg/dL — ABNORMAL HIGH (ref 70–99)
Sodium: 138 mEq/L (ref 135–145)
Total Bilirubin: 0.2 mg/dL — ABNORMAL LOW (ref 0.3–1.2)
Total Protein: 7.1 g/dL (ref 6.0–8.3)

## 2013-01-23 NOTE — ED Notes (Signed)
Pt. reports dizziness / vertigo with slight nausea onset this eveniing , pt. took Dramamine with slight relief . Alert and oriented , speech clear with no facial asymmetry , equal grips .

## 2013-01-24 ENCOUNTER — Emergency Department (HOSPITAL_COMMUNITY): Payer: Medicare Other

## 2013-01-24 ENCOUNTER — Encounter (HOSPITAL_COMMUNITY): Payer: Self-pay | Admitting: Radiology

## 2013-01-24 LAB — URINALYSIS, ROUTINE W REFLEX MICROSCOPIC
Bilirubin Urine: NEGATIVE
Glucose, UA: NEGATIVE mg/dL
Nitrite: NEGATIVE
Specific Gravity, Urine: 1.012 (ref 1.005–1.030)
Urobilinogen, UA: 0.2 mg/dL (ref 0.0–1.0)
pH: 6.5 (ref 5.0–8.0)

## 2013-01-24 LAB — ETHANOL: Alcohol, Ethyl (B): <11 mg/dL (ref 0–11)

## 2013-01-24 LAB — POCT I-STAT TROPONIN I: Troponin i, poc: 0.04 ng/mL (ref 0.00–0.08)

## 2013-01-24 LAB — GLUCOSE, CAPILLARY: Glucose-Capillary: 116 mg/dL — ABNORMAL HIGH (ref 70–99)

## 2013-01-24 LAB — PROTIME-INR: Prothrombin Time: 13.9 seconds (ref 11.6–15.2)

## 2013-01-24 LAB — RAPID URINE DRUG SCREEN, HOSP PERFORMED: Barbiturates: NOT DETECTED

## 2013-01-24 LAB — APTT: aPTT: 30 seconds (ref 24–37)

## 2013-01-24 LAB — TROPONIN I: Troponin I: <0.3 ng/mL (ref <0.30)

## 2013-01-24 MED ORDER — MORPHINE SULFATE 4 MG/ML IJ SOLN
4.0000 mg | Freq: Once | INTRAMUSCULAR | Status: AC
  Administered 2013-01-24: 4 mg via INTRAVENOUS
  Filled 2013-01-24: qty 1

## 2013-01-24 NOTE — ED Notes (Signed)
Patient transported to MRI 

## 2013-01-24 NOTE — ED Notes (Signed)
Pt sleeping/ resting, eyes closed, NAD, calm, VSS.

## 2013-01-24 NOTE — ED Provider Notes (Signed)
CSN: [id number]     Arrival date & time 01/23/13  2221 History   First MD Initiated Contact with Patient 01/23/13 2357     Chief Complaint  Patient presents with  . Dizziness    HPI Patient reports starting to feel off balance which began abruptly approximately 3 hours ago.  She denies difficulty with her speech.  No weakness of her arms or legs.  She denies a sense of the room spinning around.  Friends and family who are with her the bedside stated they were concerned that she was going to fall as they were getting in the car as her balance seems off.  No prior history of stroke.  She is currently on a eliquis as she has a history of paroxysmal atrial fibrillation.  She states she is taking this as prescribed except for days in which she takes pain medicine that has aspirin in it and she is concerned about taking aspirin and eliquis together.  She reports headache at this time.  No prior history of headaches.  No recent fall or injury.  No trauma.  No chest pain shortness of breath.  No neck pain or neck injury.  Family reports baseline mental status.  No prior history of vertigo   Past Medical History  Diagnosis Date  . ALLERGIC RHINITIS   . ANXIETY   . ASTHMA   . BACK PAIN   . CATARACT NOS   . DEPRESSION   . GLAUCOMA NOS   . Hypercalcemia   . MITRAL VALVE PROLAPSE   . OSTEOPOROSIS   . PERIPHERAL NEUROPATHY   . POSITIONAL VERTIGO   . Rosacea   . HTN (hypertension)   . Carotid stenosis 04/27/2012    Mild left on community screening  - Nov 2013  . Atrial fibrillation    Past Surgical History  Procedure Laterality Date  . Cataract extraction, bilateral    . Posterior chamber interocular lens implant    . Hemorrhoid surgery    .  ankle surgury      left ankle   Family History  Problem Relation Age of Onset  . Cancer Sister     colon  . Cancer Brother     prostate cancer  . CAD Mother     MI at age 52   History  Substance Use Topics  . Smoking status: Never Smoker   .  Smokeless tobacco: Never Used  . Alcohol Use: No   OB History   Grav Para Term Preterm Abortions TAB SAB Ect Mult Living                 Review of Systems  All other systems reviewed and are negative.    Allergies  Review of patient's allergies indicates no known allergies.  Home Medications   Current Outpatient Rx  Name  Route  Sig  Dispense  Refill  . ALPRAZolam (XANAX) 0.5 MG tablet   Oral   Take 0.5 tablets (0.25 mg total) by mouth 2 (two) times daily as needed.   60 tablet   1   . apixaban (ELIQUIS) 2.5 MG TABS tablet   Oral   Take 1 tablet (2.5 mg total) by mouth 2 (two) times daily.   180 tablet   4   . Biotin 1 MG CAPS   Oral   Take 1 tablet by mouth daily.         . Brinzolamide-Brimonidine (SIMBRINZA) 1-0.2 % SUSP   Ophthalmic   Apply 1 application  to eye 2 (two) times daily.   8 mL   5   . Calcium Carbonate-Vitamin D (CALCIUM + D PO)   Oral   Take 1 tablet by mouth 2 (two) times daily.         Marland Kitchen glucosamine-chondroitin 500-400 MG tablet   Oral   Take 2 tablets by mouth daily.         . IRON, FERROUS GLUCONATE, PO   Oral   Take 1 tablet by mouth every other day.         . losartan (COZAAR) 50 MG tablet   Oral   Take 1 tablet (50 mg total) by mouth daily.   90 tablet   3   . LUTEIN PO   Oral   Take 1 tablet by mouth daily.         . Multiple Vitamins-Minerals (MACULAR VITAMIN BENEFIT PO)   Oral   Take 2 tablets by mouth daily.         . Multiple Vitamins-Minerals (MULTIVITAMIN & MINERAL PO)   Oral   Take 1 tablet by mouth daily.         . vitamin B-12 (CYANOCOBALAMIN) 1000 MCG tablet   Oral   Take 1,000 mcg by mouth daily.         . AZOPT 1 % ophthalmic suspension      as directed.          BP 147/61  Pulse 59  Temp(Src) 98 F (36.7 C) (Oral)  Resp 10  SpO2 95% Physical Exam  Nursing note and vitals reviewed. Constitutional: She is oriented to person, place, and time. She appears well-developed and  well-nourished. No distress.  HENT:  Head: Normocephalic and atraumatic.  Eyes: EOM are normal. Pupils are equal, round, and reactive to light.  Neck: Normal range of motion.  Cardiovascular: Normal rate, regular rhythm and normal heart sounds.   Pulmonary/Chest: Effort normal and breath sounds normal.  Abdominal: Soft. She exhibits no distension. There is no tenderness.  Musculoskeletal: Normal range of motion.  Neurological: She is alert and oriented to person, place, and time.  5/5 strength in major muscle groups of  bilateral upper and lower extremities. Speech normal. No facial asymetry.  Ataxic gait  Skin: Skin is warm and dry.  Psychiatric: She has a normal mood and affect. Judgment normal.    ED Course  Procedures (including critical care time) Labs Review Labs Reviewed  CBC WITH DIFFERENTIAL - Abnormal; Notable for the following:    WBC 2.8 (*)    RBC 3.77 (*)    HCT 35.7 (*)    Neutro Abs 1.4 (*)    Monocytes Relative 14 (*)    All other components within normal limits  COMPREHENSIVE METABOLIC PANEL - Abnormal; Notable for the following:    Glucose, Bld 124 (*)    AST 39 (*)    Total Bilirubin 0.2 (*)    GFR calc non Af Amer 77 (*)    GFR calc Af Amer 89 (*)    All other components within normal limits  URINE RAPID DRUG SCREEN (HOSP PERFORMED) - Abnormal; Notable for the following:    Opiates POSITIVE (*)    All other components within normal limits  URINALYSIS, ROUTINE W REFLEX MICROSCOPIC - Abnormal; Notable for the following:    Hgb urine dipstick LARGE (*)    Leukocytes, UA TRACE (*)    All other components within normal limits  GLUCOSE, CAPILLARY - Abnormal; Notable for the following:  Glucose-Capillary 116 (*)    All other components within normal limits  URINE MICROSCOPIC-ADD ON - Abnormal; Notable for the following:    Casts GRANULAR CAST (*)    All other components within normal limits  ETHANOL  PROTIME-INR  APTT  TROPONIN I  POCT I-STAT TROPONIN  I   Imaging Review Ct Head Wo Contrast  01/24/2013   *RADIOLOGY REPORT*  Clinical Data: Dizziness, vertigo  CT HEAD WITHOUT CONTRAST  Technique:  Contiguous axial images were obtained from the base of the skull through the vertex without contrast.  Comparison: 09/03/2007 MRI  Findings: Prominence of the sulci, cisterns, and ventricles, in keeping with volume loss. There are subcortical and periventricular white matter hypodensities, a nonspecific finding most often seen with chronic microangiopathic changes.  There is no evidence for acute hemorrhage, overt hydrocephalus, mass lesion, or abnormal extra-axial fluid collection.  No definite CT evidence for acute cortical based (large artery) infarction. Atherosclerotic vascular calcifications. The visualized paranasal sinuses and mastoid air cells are predominately clear.  Impression:  Volume loss and white matter changes as above.  No CT evidence of acute intracranial abnormality.   Original Report Authenticated By: Jearld Lesch, M.D.    ECG interpretation   Date: 01/24/2013  Rate: 73  Rhythm: normal sinus rhythm  QRS Axis: normal  Intervals: normal  ST/T Wave abnormalities: normal  Conduction Disutrbances: none  Narrative Interpretation:   Old EKG Reviewed: No significant changes noted     MDM  No diagnosis found. Given her ataxia the patient will be kept overnight in the emergency department for an MRI scan of her brain.  This may represent atypical peripheral vertigo however given her age she is at risk for central vertigo.  CT scan without evidence of bleed.  Patient feels much better after pain medicine.  If her MRI scan is normal she can be discharged home with PCP followup.  Her MRI scan demonstrates stroke or other abnormality the patient will benefit from hospitalization.  Care to oncoming physician    Lyanne Co, MD 01/24/13 [phone]

## 2013-01-24 NOTE — ED Provider Notes (Signed)
Care assumed at the change of shift. Pt with vertiginous dizziness, pending MRI which was neg for stroke. Pt remains asymptomatic. Will ensure she is able to ambulate without ataxia and plan discharge if she is steady and safe.   Charles B. Bernette Mayers, MD 01/24/13 [phone]

## 2013-01-24 NOTE — ED Notes (Signed)
Ambulated Patient, she did well.  Standby assist.

## 2013-01-24 NOTE — ED Notes (Signed)
Pt denies sx. ambulatory to b/r, steady gait, denies dizziness. Alert, NAD, calm, interactive, speech clear.

## 2013-01-24 NOTE — ED Notes (Signed)
cbg 116 

## 2013-01-27 ENCOUNTER — Ambulatory Visit (INDEPENDENT_AMBULATORY_CARE_PROVIDER_SITE_OTHER): Payer: Medicare Other | Admitting: Internal Medicine

## 2013-01-27 ENCOUNTER — Encounter: Payer: Self-pay | Admitting: Internal Medicine

## 2013-01-27 VITALS — BP 132/80 | HR 61 | Temp 97.9°F | Ht 62.0 in | Wt 121.1 lb

## 2013-01-27 DIAGNOSIS — R112 Nausea with vomiting, unspecified: Secondary | ICD-10-CM | POA: Insufficient documentation

## 2013-01-27 DIAGNOSIS — G8929 Other chronic pain: Secondary | ICD-10-CM

## 2013-01-27 DIAGNOSIS — M545 Low back pain: Secondary | ICD-10-CM

## 2013-01-27 DIAGNOSIS — I10 Essential (primary) hypertension: Secondary | ICD-10-CM

## 2013-01-27 NOTE — Progress Notes (Signed)
Subjective:    Patient ID: Ann Booth, female    DOB: 01/09/1927, 79 y.o.   MRN: [id number]  HPI  Here after an episode of "feeling strange" the closest she can call it maybe a type of dizziness, but not vertigo type.  Had n/v sudden onset, but quickly all symptoms resolved after vomit x 1, a short time after drinking ? Bad V8 juice that didn't tast right but drank half glass anyway. No further fever, blood, and Denies worsening reflux, abd pain, dysphagia, n/v, bowel change or blood.  Seen in ER with mult labs, UDS, and Ct/MRI head neg for stroke.  Pt denies chest pain, increased sob or doe, wheezing, orthopnea, PND, increased LE swelling, palpitations, dizziness or syncope.  Pt denies new neurological symptoms such as new headache, or facial or extremity weakness or numbness.   Pt denies fever, wt loss, night sweats, loss of appetite, or other constitutional symptoms  Denies urinary symptoms such as dysuria, frequency, urgency, flank pain, hematuria or n/v, fever, chills. Did have some mild acute left on chronic bilat lower back after raking.  Pt continues to have recurring left LBP without change in severity, bowel or bladder change, fever, wt loss,  worsening LE pain/numbness/weakness, gait change or falls.  Has fatigue recently she attributes to her eye drops  Past Medical History  Diagnosis Date  . ALLERGIC RHINITIS   . ANXIETY   . ASTHMA   . BACK PAIN   . CATARACT NOS   . DEPRESSION   . GLAUCOMA NOS   . Hypercalcemia   . MITRAL VALVE PROLAPSE   . OSTEOPOROSIS   . PERIPHERAL NEUROPATHY   . POSITIONAL VERTIGO   . Rosacea   . HTN (hypertension)   . Carotid stenosis 04/27/2012    Mild left on community screening  - Nov 2013  . Atrial fibrillation    Past Surgical History  Procedure Laterality Date  . Cataract extraction, bilateral    . Posterior chamber interocular lens implant    . Hemorrhoid surgery    .  ankle surgury      left ankle    reports that she has never smoked.  She has never used smokeless tobacco. She reports that she does not drink alcohol or use illicit drugs. family history includes CAD in her mother; Cancer in her brother and sister. No Known Allergies Current Outpatient Prescriptions on File Prior to Visit  Medication Sig Dispense Refill  . ALPRAZolam (XANAX) 0.5 MG tablet Take 0.5 tablets (0.25 mg total) by mouth 2 (two) times daily as needed.  60 tablet  1  . apixaban (ELIQUIS) 2.5 MG TABS tablet Take 1 tablet (2.5 mg total) by mouth 2 (two) times daily.  180 tablet  4  . AZOPT 1 % ophthalmic suspension as directed.      . Biotin 1 MG CAPS Take 1 tablet by mouth daily.      . Brinzolamide-Brimonidine (SIMBRINZA) 1-0.2 % SUSP Apply 1 application to eye 2 (two) times daily.  8 mL  5  . Calcium Carbonate-Vitamin D (CALCIUM + D PO) Take 1 tablet by mouth 2 (two) times daily.      Marland Kitchen glucosamine-chondroitin 500-400 MG tablet Take 2 tablets by mouth daily.      . IRON, FERROUS GLUCONATE, PO Take 1 tablet by mouth every other day.      . losartan (COZAAR) 50 MG tablet Take 1 tablet (50 mg total) by mouth daily.  90 tablet  3  .  LUTEIN PO Take 1 tablet by mouth daily.      . Multiple Vitamins-Minerals (MACULAR VITAMIN BENEFIT PO) Take 2 tablets by mouth daily.      . Multiple Vitamins-Minerals (MULTIVITAMIN & MINERAL PO) Take 1 tablet by mouth daily.      . vitamin B-12 (CYANOCOBALAMIN) 1000 MCG tablet Take 1,000 mcg by mouth daily.      . [DISCONTINUED] hydrochlorothiazide (,MICROZIDE/HYDRODIURIL,) 12.5 MG capsule Take 1 capsule (12.5 mg total) by mouth daily.  90 capsule  3   No current facility-administered medications on file prior to visit.   Review of Systems  Constitutional: Negative for unexpected weight change, or unusual diaphoresis  HENT: Negative for tinnitus.   Eyes: Negative for photophobia and visual disturbance.  Respiratory: Negative for choking and stridor.   Gastrointestinal: Negative for vomiting and blood in stool.    Genitourinary: Negative for hematuria and decreased urine volume.  Musculoskeletal: Negative for acute joint swelling Skin: Negative for color change and wound.  Neurological: Negative for tremors and numbness other than noted  Psychiatric/Behavioral: Negative for decreased concentration or  hyperactivity.       Objective:   Physical Exam BP 132/80  Pulse 61  Temp(Src) 97.9 F (36.6 C) (Oral)  Ht 5\' 2"  (1.575 m)  Wt 121 lb 2 oz (54.942 kg)  BMI 22.15 kg/m2  SpO2 98% VS noted,  Constitutional: Pt appears well-developed and well-nourished.  HENT: Head: NCAT.  Right Ear: External ear normal.  Left Ear: External ear normal.  Eyes: Conjunctivae and EOM are normal. Pupils are equal, round, and reactive to light.  Neck: Normal range of motion. Neck supple.  Cardiovascular: Normal rate and regular rhythm.   Pulmonary/Chest: Effort normal and breath sounds normal.  Abd:  Soft, NT, non-distended, + BS Neurological: Pt is alert. Not confused  Skin: Skin is warm. No erythema.  Psychiatric: Pt behavior is normal. Thought content normal.     Assessment & Plan:

## 2013-01-27 NOTE — Assessment & Plan Note (Signed)
stable overall by history and exam, and pt to continue medical treatment as before,  to f/u any worsening symptoms or concerns 

## 2013-01-27 NOTE — Progress Notes (Signed)
Pre-visit discussion using our clinic review tool. No additional management support is needed unless otherwise documented below in the visit note.  

## 2013-01-27 NOTE — Assessment & Plan Note (Signed)
stable overall by history and exam, recent data reviewed with pt, and pt to continue medical treatment as before,  to f/u any worsening symptoms or concerns BP Readings from Last 3 Encounters:  01/27/13 132/80  01/24/13 148/60  11/01/12 128/82

## 2013-01-27 NOTE — Patient Instructions (Addendum)
Please continue all other medications as before, and refills have been done if requested. Please have the pharmacy call with any other refills you may need. No further lab testing needed at this time  OK to cancel the Feb 2015 appointment with me  Please return in 6 months, or sooner if needed

## 2013-01-27 NOTE — Assessment & Plan Note (Addendum)
One episode sudden onset and resolution, apparently assoc with dizziness, now resolved, eval in ER neg as documented, likely food related,  to f/u any worsening symptoms or concerns

## 2013-02-02 ENCOUNTER — Telehealth: Payer: Self-pay | Admitting: Internal Medicine

## 2013-02-02 ENCOUNTER — Other Ambulatory Visit: Payer: Self-pay | Admitting: Internal Medicine

## 2013-02-02 DIAGNOSIS — R42 Dizziness and giddiness: Secondary | ICD-10-CM

## 2013-02-02 DIAGNOSIS — H9203 Otalgia, bilateral: Secondary | ICD-10-CM

## 2013-02-02 DIAGNOSIS — H9209 Otalgia, unspecified ear: Secondary | ICD-10-CM

## 2013-02-02 NOTE — Telephone Encounter (Signed)
Pt request referral for ENT (Dr. Jenne Pane [phone]). Pt stated that she starting to feel dizzy again the other day, that's the reason why she wants to go see Dr. Jenne Pane. Please advise

## 2013-02-02 NOTE — Telephone Encounter (Signed)
Done per emr 

## 2013-03-04 ENCOUNTER — Telehealth: Payer: Self-pay | Admitting: *Deleted

## 2013-03-04 NOTE — Telephone Encounter (Signed)
Patient called in wanting to know what kind of pain medication she could take with the Eliquis. Advised patient ok to take Tylenol but to avoid antiinflammatories.

## 2013-03-28 ENCOUNTER — Other Ambulatory Visit: Payer: Self-pay | Admitting: *Deleted

## 2013-03-28 MED ORDER — APIXABAN 2.5 MG PO TABS: 2.5000 mg | ORAL_TABLET | Freq: Two times a day (BID) | ORAL | Status: DC

## 2013-03-30 ENCOUNTER — Ambulatory Visit (INDEPENDENT_AMBULATORY_CARE_PROVIDER_SITE_OTHER): Payer: Medicare Other | Admitting: Internal Medicine

## 2013-03-30 ENCOUNTER — Encounter: Payer: Self-pay | Admitting: Internal Medicine

## 2013-03-30 ENCOUNTER — Encounter: Payer: Self-pay | Admitting: Family Medicine

## 2013-03-30 ENCOUNTER — Ambulatory Visit (INDEPENDENT_AMBULATORY_CARE_PROVIDER_SITE_OTHER): Payer: Medicare Other | Admitting: Family Medicine

## 2013-03-30 VITALS — BP 132/72 | HR 85 | Temp 97.0°F | Ht 62.0 in | Wt 122.4 lb

## 2013-03-30 VITALS — BP 132/72 | HR 85 | Temp 97.0°F | Resp 16 | Wt 122.6 lb

## 2013-03-30 DIAGNOSIS — M545 Low back pain, unspecified: Secondary | ICD-10-CM

## 2013-03-30 DIAGNOSIS — M2170 Unequal limb length (acquired), unspecified site: Secondary | ICD-10-CM

## 2013-03-30 DIAGNOSIS — G8929 Other chronic pain: Secondary | ICD-10-CM

## 2013-03-30 DIAGNOSIS — R609 Edema, unspecified: Secondary | ICD-10-CM

## 2013-03-30 DIAGNOSIS — I10 Essential (primary) hypertension: Secondary | ICD-10-CM

## 2013-03-30 DIAGNOSIS — M47817 Spondylosis without myelopathy or radiculopathy, lumbosacral region: Secondary | ICD-10-CM

## 2013-03-30 DIAGNOSIS — M47818 Spondylosis without myelopathy or radiculopathy, sacral and sacrococcygeal region: Secondary | ICD-10-CM | POA: Insufficient documentation

## 2013-03-30 DIAGNOSIS — M461 Sacroiliitis, not elsewhere classified: Secondary | ICD-10-CM

## 2013-03-30 NOTE — Assessment & Plan Note (Signed)
stable overall by history and exam, recent data reviewed with pt, and pt to continue medical treatment as before,  to f/u any worsening symptoms or concerns BP Readings from Last 3 Encounters:  03/30/13 132/72  01/27/13 132/80  01/24/13 148/60

## 2013-03-30 NOTE — Progress Notes (Signed)
Pre-visit discussion using our clinic review tool. No additional management support is needed unless otherwise documented below in the visit note.  

## 2013-03-30 NOTE — Patient Instructions (Addendum)
Please continue all other medications as before Please have the pharmacy call with any other refills you may need.  You have an Appt with Dr Katrinka Blazing at 4 PM today, but your actual time to be seen may be later than this since he is seeing another patient at this time

## 2013-03-30 NOTE — Assessment & Plan Note (Signed)
After verbal consent patient was prepped with alcohol swabs and with the 27-gauge 1-1/2 inch needle patient was injected with 1 cc of 0.5% Marcaine and 1 cc of Kenalog 40 mg/dL. Patient tolerated the procedure well and did have significant decrease in pain immediately. A home instructions given. Patient was also given icing protocol we discussed over-the-counter medicines and help with the arthritis. Patient was given a heel lift in the right shoe. Patient will come back again in 2-4 weeks for further evaluation. He is continuing to have pain we may want to consider greater trochanteric bursitis secondary to the pain that she had previously.

## 2013-03-30 NOTE — Assessment & Plan Note (Signed)
None today on exam, ok to follow for now

## 2013-03-30 NOTE — Patient Instructions (Signed)
Very nice to meet you.  Try the exercises most days of the week.  Sacroiliac Joint Mobilization and Rehab 1. Work on pretzel stretching, shoulder back and leg draped in front. 3-5 sets, 30 sec.. 2. rolling up and back knees to chest and rocking. 3. sacral tilt - 5 sets, hold for 5-10 seconds  Take tylenol 650 mg three times a day is the best evidence based medicine we have for arthritis.  Glucosamine sulfate 750mg  twice a day is a supplement that has been shown to help moderate to severe arthritis. Vitamin D 2000 IU daily Tumeric 500mg  twice daily.  Capsaicin topically up to four times a day may also help with pain. Cortisone injections are an option if these interventions do not seem to make a difference or need more relief.  If cortisone injections do not help, there are different types of shots that may help but they take longer to take effect.  We can discuss this at follow up.  It's important that you continue to stay active. Controlling your weight is important.  Consider physical therapy to strengthen muscles around the joint that hurts to take pressure off of the joint itself. Wear the lift in your right shoe.  Come back and see me in 3 weeks. If still in pain we may try one in your hip and consider xrays.

## 2013-03-30 NOTE — Assessment & Plan Note (Signed)
Heel lift added to the right shoe. We'll see if this helps with some of her discomfort she is having of the left SI joint.

## 2013-03-30 NOTE — Progress Notes (Signed)
Subjective:    Patient ID: Ann Booth, female    DOB: 05/20/1926, 80 y.o.   MRN: [id number]  HPI  Here to f/u, mentions she saw Dr Doristine BosworthVoytek/ortho with "left hip pain" but points to her left lower back/SI area, had left hip film apparently neg for significant arthritis, did have 2 cortisone to right and left buttock per pt, then predpack - didn't seem to help; then saw Dr Herrick/urology - Ct abd/pelvis did show stone but not near left lateral hip area of concern.  This wk also with left foot swelling several days this wk, worse by bedtime, better next am. Worst part of the left lower back issue is trying to get OOB in the am with a sort of "catch" that inhibits her movement and pain increased. Mentions taking eliquis for 2 mo but no recent falls or hard sitting, no overt bruising. Past Medical History  Diagnosis Date  . ALLERGIC RHINITIS   . ANXIETY   . ASTHMA   . BACK PAIN   . CATARACT NOS   . DEPRESSION   . GLAUCOMA NOS   . Hypercalcemia   . MITRAL VALVE PROLAPSE   . OSTEOPOROSIS   . PERIPHERAL NEUROPATHY   . POSITIONAL VERTIGO   . Rosacea   . HTN (hypertension)   . Carotid stenosis 04/27/2012    Mild left on community screening  - Nov 2013  . Atrial fibrillation    Past Surgical History  Procedure Laterality Date  . Cataract extraction, bilateral    . Posterior chamber interocular lens implant    . Hemorrhoid surgery    .  ankle surgury      left ankle    reports that she has never smoked. She has never used smokeless tobacco. She reports that she does not drink alcohol or use illicit drugs. family history includes CAD in her mother; Cancer in her brother and sister. No Known Allergies   Current Outpatient Prescriptions on File Prior to Visit  Medication Sig Dispense Refill  . ALPRAZolam (XANAX) 0.5 MG tablet Take 0.5 tablets (0.25 mg total) by mouth 2 (two) times daily as needed.  60 tablet  1  . apixaban (ELIQUIS) 2.5 MG TABS tablet Take 1 tablet (2.5 mg total) by  mouth 2 (two) times daily.  30 tablet  0  . Biotin 1 MG CAPS Take 1 tablet by mouth daily.      . Brinzolamide-Brimonidine (SIMBRINZA) 1-0.2 % SUSP Apply 1 application to eye 2 (two) times daily.  8 mL  5  . Calcium Carbonate-Vitamin D (CALCIUM + D PO) Take 1 tablet by mouth 2 (two) times daily.      Marland Kitchen. glucosamine-chondroitin 500-400 MG tablet Take 2 tablets by mouth daily.      . IRON, FERROUS GLUCONATE, PO Take 1 tablet by mouth every other day.      . losartan (COZAAR) 50 MG tablet Take 1 tablet (50 mg total) by mouth daily.  90 tablet  3  . LUTEIN PO Take 1 tablet by mouth daily.      . Multiple Vitamins-Minerals (MACULAR VITAMIN BENEFIT PO) Take 2 tablets by mouth daily.      . Multiple Vitamins-Minerals (MULTIVITAMIN & MINERAL PO) Take 1 tablet by mouth daily.      . vitamin B-12 (CYANOCOBALAMIN) 1000 MCG tablet Take 1,000 mcg by mouth daily.      . [DISCONTINUED] hydrochlorothiazide (,MICROZIDE/HYDRODIURIL,) 12.5 MG capsule Take 1 capsule (12.5 mg total) by mouth daily.  90  capsule  3   No current facility-administered medications on file prior to visit.   Review of Systems  Constitutional: Negative for unexpected weight change, or unusual diaphoresis  HENT: Negative for tinnitus.   Eyes: Negative for photophobia and visual disturbance.  Respiratory: Negative for choking and stridor.   Gastrointestinal: Negative for vomiting and blood in stool.  Genitourinary: Negative for hematuria and decreased urine volume.  Musculoskeletal: Negative for acute joint swelling Skin: Negative for color change and wound.  Neurological: Negative for tremors and numbness other than noted  Psychiatric/Behavioral: Negative for decreased concentration or  hyperactivity.       Objective:   Physical Exam BP 132/72  Pulse 85  Temp(Src) 97 F (36.1 C) (Oral)  Ht 5\' 2"  (1.575 m)  Wt 122 lb 6 oz (55.509 kg)  BMI 22.38 kg/m2  SpO2 94% VS noted, not ill appearing Constitutional: Pt appears  well-developed and well-nourished.  HENT: Head: NCAT.  Right Ear: External ear normal.  Left Ear: External ear normal.  Eyes: Conjunctivae and EOM are normal. Pupils are equal, round, and reactive to light.  Neck: Normal range of motion. Neck supple.  Cardiovascular: Normal rate and regular rhythm.   Pulmonary/Chest: Effort normal and breath sounds normal.  Abd:  Soft, NT, non-distended, + BS Neurological: Pt is alert. Not confused , motor grossly intact Spine nontender Skin: Skin is warm. No erythema. no LE edema Psychiatric: Pt behavior is normal. Thought content normal.      Assessment & Plan:

## 2013-03-30 NOTE — Progress Notes (Signed)
  I'm seeing this patient by the request  of:  Oliver Barre, MD   CC: left hip pain.   HPI: Patient is a very pleasant 80 year old female who states that she hurt her back/hip back in November when she was trying to rake leaves. Patient states since that time she has had this exacerbation of back pain multiple times but now it seems to be worse and more chronic. Patient points to the left side mostly over the sacroiliac joint. Patient states that this can radiate around towards the lateral aspect of the hip as well as the groin. Patient states it is worse whenever she tries to walk and does seem better if she stays still. Patient denies any significant radiation down her legs or any numbness. Past medical history significant for scoliosis that was diagnosed and 31 years and has caused some significant discomfort over the years. Patient states the severity pain is 9/10.  Patient's last x-rays the lumbar spine did show in 2005 moderate scoliosis of the lumbar spine with osteoporosis. Patient also had degenerative discs facet changes of the whole lumbar spine with mild central stenosis.   Past medical, surgical, family and social history reviewed. Medications reviewed all in the electronic medical record.   Review of Systems: No headache, visual changes, nausea, vomiting, diarrhea, constipation, dizziness, abdominal pain, skin rash, fevers, chills, night sweats, weight loss, swollen lymph nodes, body aches, joint swelling, muscle aches, chest pain, shortness of breath, mood changes.   Objective:    Blood pressure 132/72, pulse 85, temperature 97 F (36.1 C), temperature source Oral, resp. rate 16, weight 122 lb 9.6 oz (55.611 kg), SpO2 94.00%.   General: No apparent distress alert and oriented x3 mood and affect normal, dressed appropriately. Frail woman with significant scoliosis causing right-sided side bending significant kyphosis as well. HEENT: Pupils equal, extraocular movements  intact Respiratory: Patient's speak in full sentences and does not appear short of breath Cardiovascular: No lower extremity edema, non tender, no erythema Skin: Warm dry intact with no signs of infection or rash on extremities or on axial skeleton. Abdomen: Soft nontender Neuro: Cranial nerves II through XII are intact, neurovascularly intact in all extremities with 2+ DTRs and 2+ pulses. Lymph: No lymphadenopathy of posterior or anterior cervical chain or axillae bilaterally.  MSK: Non tender with full range of motion and good stability and symmetric strength and tone of shoulders, elbows, wrist, hip, knee and ankles bilaterally. Osteoarthritic changes of multiple joints. Back Exam:  Inspection: Patient has significant scoliosis Motion: Flexion 25 deg, Extension 25 deg, Side Bending to 35 deg bilaterally,  Rotation to 45 deg bilaterally  SLR laying: Negative  XSLR laying: Negative  Palpable tenderness: Really tender over the SI joint. FABER: Positive on left Sensory change: Gross sensation intact to all lumbar and sacral dermatomes.  Reflexes: 2+ at both patellar tendons, 2+ at achilles tendons, Babinski's downgoing.  Strength at foot  Plantar-flexion: 5/5 Dorsi-flexion: 5/5 Eversion: 5/5 Inversion: 5/5  Leg strength  Quad: 5/5 Hamstring: 5/5 Hip flexor: 5/5 Hip abductors: 3/5  Patient does take short steps with ambulation. Patient does have a leg length discrepancy with right leg being a half inch shorter than left leg.    Impression and Recommendations:     This case required medical decision making of moderate complexity.

## 2013-03-30 NOTE — Assessment & Plan Note (Signed)
With recent flare not better with recent eval and tx; asks for second opinion, will refer to Dr Smith/[facility] in our office who agrees to see today, cont same meds for now

## 2013-04-12 ENCOUNTER — Ambulatory Visit: Payer: Medicare Other | Admitting: Internal Medicine

## 2013-04-20 ENCOUNTER — Ambulatory Visit: Payer: Medicare Other | Admitting: Family Medicine

## 2013-04-25 ENCOUNTER — Encounter: Payer: Self-pay | Admitting: Family Medicine

## 2013-04-25 ENCOUNTER — Ambulatory Visit (INDEPENDENT_AMBULATORY_CARE_PROVIDER_SITE_OTHER): Payer: Medicare Other | Admitting: Family Medicine

## 2013-04-25 VITALS — BP 116/68 | HR 74 | Temp 97.3°F | Resp 16 | Wt 120.1 lb

## 2013-04-25 DIAGNOSIS — M47817 Spondylosis without myelopathy or radiculopathy, lumbosacral region: Secondary | ICD-10-CM

## 2013-04-25 DIAGNOSIS — M47818 Spondylosis without myelopathy or radiculopathy, sacral and sacrococcygeal region: Secondary | ICD-10-CM

## 2013-04-25 MED ORDER — DICLOFENAC SODIUM 1 % TD GEL: 2.0000 g | Freq: Two times a day (BID) | TRANSDERMAL | Status: DC

## 2013-04-25 NOTE — Progress Notes (Signed)
Pre visit review using our clinic review tool, if applicable. No additional management support is needed unless otherwise documented below in the visit note. 

## 2013-04-25 NOTE — Assessment & Plan Note (Addendum)
Patient does have underlying arthritis  Patient did have an exacerbation after doing the shoveling. Encourage her not to do any exertion greater than her home exercises her regular activities of daily living for now. Discuss continuing the same regimen and did give her prescription for topical anti-inflammatory to try. Patient's we'll not have that medication filled if it is too expensive. Visual come back in 3-4 weeks. Continuing to have pain on the could do another injection and we may want to consider formal physical therapy.  Spent greater than 25 minutes with patient face-to-face and had greater than 50% of counseling including as described above in assessment and plan.

## 2013-04-25 NOTE — Progress Notes (Signed)
   CC: left hip pain follow up.   HPI: Patient is a very pleasant 80 year old female who was found to have severe osteophytic changes and scoliosis that was causing a left-sided SI joint dysfunction. Patient at last visit did have an injection into the left SI joint and states that the pain was almost completely resolved. Patient was doing very well with this and over-the-counter medications and unfortunately started having worsening pain after trying to shovel in a driveway. Patient states it still significantly better than it was previously but is having some mild discomfort on the backside again. Denies any radiation down the leg or any numbness or tingling. Patient was continues to wear the heel lift which has been helpful.  Patient's last x-rays the lumbar spine did show in 2005 moderate scoliosis of the lumbar spine with osteoporosis. Patient also had degenerative discs facet changes of the whole lumbar spine with mild central stenosis.   Past medical, surgical, family and social history reviewed. Medications reviewed all in the electronic medical record.   Review of Systems: No headache, visual changes, nausea, vomiting, diarrhea, constipation, dizziness, abdominal pain, skin rash, fevers, chills, night sweats, weight loss, swollen lymph nodes, body aches, joint swelling, muscle aches, chest pain, shortness of breath, mood changes.   Objective:    Blood pressure 116/68, pulse 74, temperature 97.3 F (36.3 C), temperature source Oral, resp. rate 16, weight 120 lb 1.9 oz (54.486 kg), SpO2 97.00%.   General: No apparent distress alert and oriented x3 mood and affect normal, dressed appropriately. Frail woman with significant scoliosis causing right-sided side bending significant kyphosis as well. HEENT: Pupils equal, extraocular movements intact Respiratory: Patient's speak in full sentences and does not appear short of breath Cardiovascular: No lower extremity edema, non tender, no  erythema Skin: Warm dry intact with no signs of infection or rash on extremities or on axial skeleton. Abdomen: Soft nontender Neuro: Cranial nerves II through XII are intact, neurovascularly intact in all extremities with 2+ DTRs and 2+ pulses. Lymph: No lymphadenopathy of posterior or anterior cervical chain or axillae bilaterally.  MSK: Non tender with full range of motion and good stability and symmetric strength and tone of shoulders, elbows, wrist, hip, knee and ankles bilaterally. Osteoarthritic changes of multiple joints. Back Exam:  Inspection: Patient has significant scoliosis Motion: Flexion 25 deg, Extension 25 deg, Side Bending to 35 deg bilaterally,  Rotation to 45 deg bilaterally  SLR laying: Negative  XSLR laying: Negative  Palpable tenderness: Moderately tender over the left SI joint. FABER: Positive on left Sensory change: Gross sensation intact to all lumbar and sacral dermatomes.  Reflexes: 2+ at both patellar tendons, 2+ at achilles tendons, Babinski's downgoing.  Strength at foot  Plantar-flexion: 5/5 Dorsi-flexion: 5/5 Eversion: 5/5 Inversion: 5/5  Leg strength  Quad: 5/5 Hamstring: 5/5 Hip flexor: 5/5 Hip abductors: 3/5  Patient does take short steps with ambulation. Patient does have a leg length discrepancy with right leg being a half inch shorter than left leg.    Impression and Recommendations:     This case required medical decision making of moderate complexity.

## 2013-04-25 NOTE — Patient Instructions (Signed)
Good to see you as always Try voltaren Gel twice daily.  Continue exercises most days of the week Salonpas can help as well.  Do tylenol at least 3 times daily.  Continue the vitamin D for sure Come back and see me in 3-4 weeks. If still in pain I will do another injection.

## 2013-04-27 ENCOUNTER — Other Ambulatory Visit: Payer: Self-pay

## 2013-04-27 ENCOUNTER — Telehealth: Payer: Self-pay | Admitting: Cardiology

## 2013-04-27 DIAGNOSIS — I10 Essential (primary) hypertension: Secondary | ICD-10-CM

## 2013-04-27 MED ORDER — LOSARTAN POTASSIUM 50 MG PO TABS: 50.0000 mg | ORAL_TABLET | Freq: Every day | ORAL | Status: DC

## 2013-04-27 NOTE — Telephone Encounter (Signed)
New problem   Pt need to know should she be taking Iron and what can she take for pain that doesn't have aspirin. Please advise pt.

## 2013-04-27 NOTE — Telephone Encounter (Signed)
Left message for pt to call.

## 2013-04-27 NOTE — Telephone Encounter (Signed)
Follow up  ° ° ° °Returning call back to nurse  °

## 2013-04-27 NOTE — Telephone Encounter (Signed)
Spoke with pt, aware tylenol is about the only thing to take without aspirin. She is taking 325 mg of iron once weekly. Explained she will need to discuss with her PCP, the last CBC we checked was normal.

## 2013-05-19 ENCOUNTER — Ambulatory Visit: Payer: Medicare Other | Admitting: Cardiology

## 2013-05-24 ENCOUNTER — Ambulatory Visit: Payer: Medicare Other | Admitting: Family Medicine

## 2013-06-30 ENCOUNTER — Ambulatory Visit (INDEPENDENT_AMBULATORY_CARE_PROVIDER_SITE_OTHER): Payer: Medicare Other | Admitting: Cardiology

## 2013-06-30 ENCOUNTER — Encounter: Payer: Self-pay | Admitting: Cardiology

## 2013-06-30 VITALS — BP 140/61 | HR 61 | Ht 62.0 in | Wt 123.0 lb

## 2013-06-30 DIAGNOSIS — I4891 Unspecified atrial fibrillation: Secondary | ICD-10-CM

## 2013-06-30 MED ORDER — RIVAROXABAN 15 MG PO TABS: 15.0000 mg | ORAL_TABLET | Freq: Two times a day (BID) | ORAL | Status: DC

## 2013-06-30 NOTE — Assessment & Plan Note (Signed)
Continue present blood pressure medications. 

## 2013-06-30 NOTE — Patient Instructions (Signed)
Your physician wants you to follow-up in: 6 MONTHS WITH DR Jens Som You will receive a reminder letter in the mail two months in advance. If you don't receive a letter, please call our office to schedule the follow-up appointment.   STOP ELIQUIS  START XARELTO 15 MG ONCE DAILY-TAKE WITH THE EVENING MEAL  Your physician recommends that you HAVE LAB WORK TODAY

## 2013-06-30 NOTE — Progress Notes (Signed)
HPI: FU atrial fibrillation. Monitor in August of 2014 showed PAF. Echo in August of 2014 showed normal LV function and trivial MR; mild LAE. I reviewed echo and felt MR may be underestimated due to MVP and eccentric jet. Toprol DCed previously due to bradycardia. Since I last saw her, she denies dyspnea, chest pain, palpitations, syncope or bleeding. She has had problems with low back pain, bilateral hip pain and bilateral ankle pain. Mild pedal edema.   Current Outpatient Prescriptions  Medication Sig Dispense Refill  . apixaban (ELIQUIS) 2.5 MG TABS tablet Take 1 tablet (2.5 mg total) by mouth 2 (two) times daily.  30 tablet  0  . b complex vitamins tablet Take 1 tablet by mouth daily.      . Biotin 1 MG CAPS Take 1 tablet by mouth daily.      . Calcium Carbonate-Vitamin D (CALCIUM + D PO) Take 1 tablet by mouth 2 (two) times daily.      . Coenzyme Q10 (COQ-10) 100 MG CAPS Take by mouth daily.      Marland Kitchen glucosamine-chondroitin 500-400 MG tablet Take 2 tablets by mouth daily.      . IRON, FERROUS GLUCONATE, PO Take 1 tablet by mouth every other day.      . losartan (COZAAR) 50 MG tablet Take 1 tablet (50 mg total) by mouth daily.  90 tablet  3  . LUTEIN PO Take 1 tablet by mouth daily.      . Magnesium Citrate 100 MG TABS Take by mouth daily.      . Multiple Vitamins-Minerals (MACULAR VITAMIN BENEFIT PO) Take 2 tablets by mouth daily.      . Multiple Vitamins-Minerals (MULTIVITAMIN & MINERAL PO) Take 1 tablet by mouth daily.      . Thiamine HCl (B-1 PO) Take by mouth daily.      . TRAVATAN Z 0.004 % SOLN ophthalmic solution as directed.      . vitamin B-12 (CYANOCOBALAMIN) 1000 MCG tablet Take 1,000 mcg by mouth daily.      . [DISCONTINUED] hydrochlorothiazide (,MICROZIDE/HYDRODIURIL,) 12.5 MG capsule Take 1 capsule (12.5 mg total) by mouth daily.  90 capsule  3   No current facility-administered medications for this visit.     Past Medical History  Diagnosis Date  . ALLERGIC  RHINITIS   . ANXIETY   . ASTHMA   . BACK PAIN   . CATARACT NOS   . DEPRESSION   . GLAUCOMA NOS   . Hypercalcemia   . MITRAL VALVE PROLAPSE   . OSTEOPOROSIS   . PERIPHERAL NEUROPATHY   . POSITIONAL VERTIGO   . Rosacea   . HTN (hypertension)   . Carotid stenosis 04/27/2012    Mild left on community screening  - Nov 2013  . Atrial fibrillation     Past Surgical History  Procedure Laterality Date  . Cataract extraction, bilateral    . Posterior chamber interocular lens implant    . Hemorrhoid surgery    .  ankle surgury      left ankle    History   Social History  . Marital Status: Widowed    Spouse Name: N/A    Number of Children: N/A  . Years of Education: N/A   Occupational History  . retired [employer]    Social History Main Topics  . Smoking status: Never Smoker   . Smokeless tobacco: Never Used  . Alcohol Use: No  . Drug Use: No  .  Sexual Activity: Not on file   Other Topics Concern  . Not on file   Social History Narrative  . No narrative on file    ROS: Arthralgias but no fevers or chills, productive cough, hemoptysis, dysphasia, odynophagia, melena, hematochezia, dysuria, hematuria, rash, seizure activity, orthopnea, PND, claudication. Remaining systems are negative.  Physical Exam: Well-developed well-nourished in no acute distress.  Skin is warm and dry.  HEENT is normal.  Neck is supple.  Chest is clear to auscultation with normal expansion.  Cardiovascular exam is regular rate and rhythm. 2/6 systolic murmur apex Abdominal exam nontender or distended. No masses palpated. Extremities show 1+ ankle edema. neuro grossly intact  ECG Sinus rhythm at a rate of 61. Nonspecific inferior T-wave changes.

## 2013-06-30 NOTE — Assessment & Plan Note (Signed)
She will need follow-up echoes in the future. 

## 2013-06-30 NOTE — Assessment & Plan Note (Addendum)
Patient remains in sinus rhythm. She is concerned apixaban may be causing arthralgias. I explained I have not seen this side effect. However we will discontinue to see if her symptoms improved. Instead we will treat with xeralto 15 mg daily. Check hemoglobin and renal function. Given complaints of arthralgias I will also check a sedimentation rate. She may require a rheumatology evaluation.

## 2013-07-01 LAB — BASIC METABOLIC PANEL
BUN: 20 mg/dL (ref 6–23)
CO2: 29 mEq/L (ref 19–32)
CREATININE: 0.9 mg/dL (ref 0.4–1.2)
Calcium: 10.2 mg/dL (ref 8.4–10.5)
Chloride: 104 mEq/L (ref 96–112)
GFR: 67.51 mL/min (ref >60.00)
Glucose, Bld: 86 mg/dL (ref 70–99)
Potassium: 4.5 mEq/L (ref 3.5–5.1)
SODIUM: 140 meq/L (ref 135–145)

## 2013-07-01 LAB — CBC WITH DIFFERENTIAL/PLATELET
BASOS ABS: 0 10*3/uL (ref 0.0–0.1)
BASOS PCT: 0.6 % (ref 0.0–3.0)
EOS ABS: 0.1 10*3/uL (ref 0.0–0.7)
Eosinophils Relative: 3 % (ref 0.0–5.0)
HCT: 37.5 % (ref 36.0–46.0)
HEMOGLOBIN: 12.6 g/dL (ref 12.0–15.0)
Lymphocytes Relative: 28.7 % (ref 12.0–46.0)
Lymphs Abs: 1.2 10*3/uL (ref 0.7–4.0)
MCHC: 33.5 g/dL (ref 30.0–36.0)
MCV: 97.6 fl (ref 78.0–100.0)
MONOS PCT: 11.7 % (ref 3.0–12.0)
Monocytes Absolute: 0.5 10*3/uL (ref 0.1–1.0)
NEUTROS ABS: 2.3 10*3/uL (ref 1.4–7.7)
NEUTROS PCT: 56 % (ref 43.0–77.0)
Platelets: 172 10*3/uL (ref 150.0–400.0)
RBC: 3.84 Mil/uL — AB (ref 3.87–5.11)
RDW: 13.9 % (ref 11.5–15.5)
WBC: 4.2 10*3/uL (ref 4.0–10.5)

## 2013-07-01 LAB — SEDIMENTATION RATE: Sed Rate: 34 mm/hr — ABNORMAL HIGH (ref 0–22)

## 2013-07-05 ENCOUNTER — Encounter: Payer: Self-pay | Admitting: Cardiology

## 2013-07-05 ENCOUNTER — Other Ambulatory Visit: Payer: Self-pay | Admitting: *Deleted

## 2013-07-05 ENCOUNTER — Telehealth: Payer: Self-pay | Admitting: *Deleted

## 2013-07-05 DIAGNOSIS — I4891 Unspecified atrial fibrillation: Secondary | ICD-10-CM

## 2013-07-05 MED ORDER — RIVAROXABAN 15 MG PO TABS: 15.0000 mg | ORAL_TABLET | Freq: Every day | ORAL | Status: DC

## 2013-07-05 NOTE — Telephone Encounter (Signed)
New message ° ° ° ° ° °Returning a nurses call °

## 2013-07-05 NOTE — Telephone Encounter (Signed)
This encounter was created in error - please disregard.

## 2013-07-05 NOTE — Telephone Encounter (Signed)
PA to [facility] for patients xarelto 

## 2013-07-05 NOTE — Telephone Encounter (Signed)
[facility] approved xarelto through 07/06/2014, [place] # [id number]

## 2013-07-19 ENCOUNTER — Other Ambulatory Visit: Payer: Self-pay

## 2013-07-19 ENCOUNTER — Telehealth: Payer: Self-pay

## 2013-07-19 DIAGNOSIS — I4891 Unspecified atrial fibrillation: Secondary | ICD-10-CM

## 2013-07-19 MED ORDER — APIXABAN 2.5 MG PO TABS: 2.5000 mg | ORAL_TABLET | Freq: Two times a day (BID) | ORAL | Status: DC

## 2013-07-19 MED ORDER — RIVAROXABAN 15 MG PO TABS: 15.0000 mg | ORAL_TABLET | Freq: Every day | ORAL | Status: DC

## 2013-07-19 NOTE — Telephone Encounter (Signed)
Spoke with pt, script has been sent to the pharm.

## 2013-07-19 NOTE — Telephone Encounter (Signed)
Continue apixaban 2.5 BID and DC xarelto Lewayne Bunting

## 2013-07-28 ENCOUNTER — Encounter: Payer: Self-pay | Admitting: Internal Medicine

## 2013-07-28 ENCOUNTER — Other Ambulatory Visit (INDEPENDENT_AMBULATORY_CARE_PROVIDER_SITE_OTHER): Payer: Medicare Other

## 2013-07-28 ENCOUNTER — Ambulatory Visit (INDEPENDENT_AMBULATORY_CARE_PROVIDER_SITE_OTHER): Payer: Medicare Other | Admitting: Internal Medicine

## 2013-07-28 VITALS — BP 130/72 | HR 61 | Temp 98.1°F | Wt 125.1 lb

## 2013-07-28 DIAGNOSIS — I10 Essential (primary) hypertension: Secondary | ICD-10-CM

## 2013-07-28 DIAGNOSIS — M47818 Spondylosis without myelopathy or radiculopathy, sacral and sacrococcygeal region: Secondary | ICD-10-CM

## 2013-07-28 DIAGNOSIS — G609 Hereditary and idiopathic neuropathy, unspecified: Secondary | ICD-10-CM

## 2013-07-28 DIAGNOSIS — Z0000 Encounter for general adult medical examination without abnormal findings: Secondary | ICD-10-CM

## 2013-07-28 DIAGNOSIS — Z23 Encounter for immunization: Secondary | ICD-10-CM

## 2013-07-28 DIAGNOSIS — R609 Edema, unspecified: Secondary | ICD-10-CM

## 2013-07-28 DIAGNOSIS — M47817 Spondylosis without myelopathy or radiculopathy, lumbosacral region: Secondary | ICD-10-CM

## 2013-07-28 DIAGNOSIS — G4700 Insomnia, unspecified: Secondary | ICD-10-CM

## 2013-07-28 LAB — CBC WITH DIFFERENTIAL/PLATELET
BASOS PCT: 1.3 % (ref 0.0–3.0)
Basophils Absolute: 0 10*3/uL (ref 0.0–0.1)
EOS PCT: 3.5 % (ref 0.0–5.0)
Eosinophils Absolute: 0.1 10*3/uL (ref 0.0–0.7)
HCT: 37.6 % (ref 36.0–46.0)
Hemoglobin: 12.7 g/dL (ref 12.0–15.0)
Lymphocytes Relative: 34.6 % (ref 12.0–46.0)
Lymphs Abs: 1.2 10*3/uL (ref 0.7–4.0)
MCHC: 33.8 g/dL (ref 30.0–36.0)
MCV: 96.4 fl (ref 78.0–100.0)
MONO ABS: 0.5 10*3/uL (ref 0.1–1.0)
MONOS PCT: 13.1 % — AB (ref 3.0–12.0)
NEUTROS PCT: 47.5 % (ref 43.0–77.0)
Neutro Abs: 1.6 10*3/uL (ref 1.4–7.7)
Platelets: 192 10*3/uL (ref 150.0–400.0)
RBC: 3.9 Mil/uL (ref 3.87–5.11)
RDW: 14.2 % (ref 11.5–15.5)
WBC: 3.5 10*3/uL — AB (ref 4.0–10.5)

## 2013-07-28 LAB — LIPID PANEL
CHOL/HDL RATIO: 2
CHOLESTEROL: 181 mg/dL (ref 0–200)
HDL: 92.9 mg/dL (ref >39.00)
LDL CALC: 68 mg/dL (ref 0–99)
NonHDL: 88.1
Triglycerides: 102 mg/dL (ref 0.0–149.0)
VLDL: 20.4 mg/dL (ref 0.0–40.0)

## 2013-07-28 LAB — URINALYSIS, ROUTINE W REFLEX MICROSCOPIC
BILIRUBIN URINE: NEGATIVE
Hgb urine dipstick: NEGATIVE
Ketones, ur: NEGATIVE
LEUKOCYTES UA: NEGATIVE
Nitrite: NEGATIVE
PH: 6.5 (ref 5.0–8.0)
SPECIFIC GRAVITY, URINE: 1.015 (ref 1.000–1.030)
Total Protein, Urine: NEGATIVE
Urine Glucose: NEGATIVE
Urobilinogen, UA: 0.2 (ref 0.0–1.0)

## 2013-07-28 LAB — VITAMIN B12

## 2013-07-28 LAB — BASIC METABOLIC PANEL
BUN: 18 mg/dL (ref 6–23)
CHLORIDE: 104 meq/L (ref 96–112)
CO2: 30 mEq/L (ref 19–32)
CREATININE: 0.7 mg/dL (ref 0.4–1.2)
Calcium: 10.2 mg/dL (ref 8.4–10.5)
GFR: 88.82 mL/min (ref >60.00)
GLUCOSE: 95 mg/dL (ref 70–99)
POTASSIUM: 4.5 meq/L (ref 3.5–5.1)
Sodium: 139 mEq/L (ref 135–145)

## 2013-07-28 LAB — HEPATIC FUNCTION PANEL
ALBUMIN: 4 g/dL (ref 3.5–5.2)
ALT: 25 U/L (ref 0–35)
AST: 32 U/L (ref 0–37)
Alkaline Phosphatase: 67 U/L (ref 39–117)
Bilirubin, Direct: 0.1 mg/dL (ref 0.0–0.3)
TOTAL PROTEIN: 6.6 g/dL (ref 6.0–8.3)
Total Bilirubin: 0.6 mg/dL (ref 0.2–1.2)

## 2013-07-28 LAB — TSH: TSH: 1.12 u[IU]/mL (ref 0.35–4.50)

## 2013-07-28 LAB — VITAMIN D 25 HYDROXY (VIT D DEFICIENCY, FRACTURES): VITD: 71.27 ng/mL

## 2013-07-28 MED ORDER — HYDROCHLOROTHIAZIDE 12.5 MG PO CAPS: 12.5000 mg | ORAL_CAPSULE | Freq: Every day | ORAL | Status: DC

## 2013-07-28 MED ORDER — ALPRAZOLAM 0.5 MG PO TABS: ORAL_TABLET | ORAL | Status: DC

## 2013-07-28 MED ORDER — TRAMADOL-ACETAMINOPHEN 37.5-325 MG PO TABS: 1.0000 | ORAL_TABLET | Freq: Four times a day (QID) | ORAL | Status: DC | PRN

## 2013-07-28 NOTE — Assessment & Plan Note (Signed)
Ok for hct 12.5 prn

## 2013-07-28 NOTE — Progress Notes (Signed)
Subjective:    Patient ID: Ann Booth, female    DOB: 07/09/1926, 80 y.o.   MRN: [id number]  HPI  Here to f/u, last seen per card may 7, changed eliquis to xarelto,but had pharmacy mixup so pt to start tomorrow. No overt bleeding or bruising. Pt denies chest pain, increased sob or doe, wheezing, orthopnea, PND, increased LE swelling, palpitations, dizziness or syncope., except has had worsening distal RLE swelling x 2 mo, worse the during day to worst then at night, better somewhat in the am, some pain to the distal leg a wk ago, now resolved. Has mild left groin pain intermittent for several months, has seen [facility] - no hip related. Has right foot pain worse recenlty that inhibits her walking. Pt continues to have recurring  Left LBP without change in severity, bowel or bladder change, fever, wt loss,  worsening LE pain/numbness/weakness, gait change or falls.Asks for MRI back.  Does c/o ongoing fatigue, but denies signficant daytime hypersomnolence.  Labs from may 7 reviewed with pt  Past Medical History  Diagnosis Date  . ALLERGIC RHINITIS   . ANXIETY   . ASTHMA   . BACK PAIN   . CATARACT NOS   . DEPRESSION   . GLAUCOMA NOS   . Hypercalcemia   . MITRAL VALVE PROLAPSE   . OSTEOPOROSIS   . PERIPHERAL NEUROPATHY   . POSITIONAL VERTIGO   . Rosacea   . HTN (hypertension)   . Carotid stenosis 04/27/2012    Mild left on community screening  - Nov 2013  . Atrial fibrillation    Past Surgical History  Procedure Laterality Date  . Cataract extraction, bilateral    . Posterior chamber interocular lens implant    . Hemorrhoid surgery    .  ankle surgury      left ankle    reports that she has never smoked. She has never used smokeless tobacco. She reports that she does not drink alcohol or use illicit drugs. family history includes CAD in her mother; Cancer in her brother and sister. No Known Allergies Current Outpatient Prescriptions on File Prior to Visit  Medication  Sig Dispense Refill  . apixaban (ELIQUIS) 2.5 MG TABS tablet Take 1 tablet (2.5 mg total) by mouth 2 (two) times daily.  60 tablet  6  . b complex vitamins tablet Take 1 tablet by mouth daily.      . Biotin 1 MG CAPS Take 1 tablet by mouth daily.      . Calcium Carbonate-Vitamin D (CALCIUM + D PO) Take 1 tablet by mouth 2 (two) times daily.      . Coenzyme Q10 (COQ-10) 100 MG CAPS Take by mouth daily.      Marland Kitchen glucosamine-chondroitin 500-400 MG tablet Take 2 tablets by mouth daily.      . IRON, FERROUS GLUCONATE, PO Take 1 tablet by mouth every other day.      . losartan (COZAAR) 50 MG tablet Take 1 tablet (50 mg total) by mouth daily.  90 tablet  3  . LUTEIN PO Take 1 tablet by mouth daily.      . Magnesium Citrate 100 MG TABS Take by mouth daily.      . Multiple Vitamins-Minerals (MACULAR VITAMIN BENEFIT PO) Take 2 tablets by mouth daily.      . Multiple Vitamins-Minerals (MULTIVITAMIN & MINERAL PO) Take 1 tablet by mouth daily.      . Rivaroxaban (XARELTO) 15 MG TABS tablet Take 1 tablet (15  mg total) by mouth daily with supper.  90 tablet  3  . Thiamine HCl (B-1 PO) Take by mouth daily.      . TRAVATAN Z 0.004 % SOLN ophthalmic solution as directed.      . vitamin B-12 (CYANOCOBALAMIN) 1000 MCG tablet Take 1,000 mcg by mouth daily.      . [DISCONTINUED] hydrochlorothiazide (,MICROZIDE/HYDRODIURIL,) 12.5 MG capsule Take 1 capsule (12.5 mg total) by mouth daily.  90 capsule  3   No current facility-administered medications on file prior to visit.   Review of Systems  Constitutional: Negative for unusual diaphoresis or other sweats  HENT: Negative for ringing in ear Eyes: Negative for double vision or worsening visual disturbance.  Respiratory: Negative for choking and stridor.   Gastrointestinal: Negative for vomiting or other signifcant bowel change Genitourinary: Negative for hematuria or decreased urine volume.  Musculoskeletal: Negative for other MSK pain or swelling Skin: Negative  for color change and worsening wound.  Neurological: Negative for tremors and numbness other than noted  Psychiatric/Behavioral: Negative for decreased concentration or agitation other than above       Objective:   Physical Exam BP 130/72  Pulse 61  Temp(Src) 98.1 F (36.7 C) (Oral)  Wt 125 lb 2 oz (56.756 kg)  SpO2 97% VS noted,  Constitutional: Pt appears well-developed, well-nourished.  HENT: Head: NCAT.  Right Ear: External ear normal.  Left Ear: External ear normal.  Eyes: . Pupils are equal, round, and reactive to light. Conjunctivae and EOM are normal Neck: Normal range of motion. Neck supple.  Cardiovascular: Normal rate and regular rhythm.   Pulmonary/Chest: Effort normal and breath sounds normal.  Abd:  Soft, NT, ND, + BS Neurological: Pt is alert. Not confused , motor grossly intact Skin: Skin is warm. No rash, with left > right 1+ edema Psychiatric: Pt behavior is normal. No agitation.     Assessment & Plan:

## 2013-07-28 NOTE — Assessment & Plan Note (Signed)

## 2013-07-28 NOTE — Patient Instructions (Signed)
You had the new Prevnar pneumonia shot today  Please take all new medication as prescribed - the xanax as discussed, and the mild fluid pill as needed, and the pain medication as needed  OK to start the xarelto as per cardiology  Please continue all other medications as before, and refills have been done if requested.  Please have the pharmacy call with any other refills you may need.  Please continue your efforts at being more active, low cholesterol diet, and weight control.  You are otherwise up to date with prevention measures today.  Please keep your appointments with your specialists as you may have planned  Please go to the LAB in the Basement (turn left off the elevator) for the tests to be done today  You will be contacted by phone if any changes need to be made immediately.  Otherwise, you will receive a letter about your results with an explanation, but please check with MyChart first.  Please remember to sign up for MyChart if you have not done so, as this will be important to you in the future with finding out test results, communicating by private email, and scheduling acute appointments online when needed.  Please return in 6 months, or sooner if needed

## 2013-07-28 NOTE — Progress Notes (Signed)
Pre visit review using our clinic review tool, if applicable. No additional management support is needed unless otherwise documented below in the visit note. 

## 2013-07-28 NOTE — Assessment & Plan Note (Signed)
For ultracet prn

## 2013-07-28 NOTE — Assessment & Plan Note (Signed)
stable overall by history and exam, recent data reviewed with pt, and pt to continue medical treatment as before,  to f/u any worsening symptoms or concerns BP Readings from Last 3 Encounters:  07/28/13 130/72  06/30/13 140/61  04/25/13 116/68

## 2013-07-28 NOTE — Assessment & Plan Note (Signed)
Ok for xanax qhs prn,  to f/u any worsening symptoms or concerns 

## 2013-10-05 ENCOUNTER — Telehealth: Payer: Self-pay | Admitting: Internal Medicine

## 2013-10-05 NOTE — Telephone Encounter (Signed)
error 

## 2013-10-06 ENCOUNTER — Telehealth: Payer: Self-pay | Admitting: *Deleted

## 2013-10-06 NOTE — Telephone Encounter (Signed)
Pt came to the office yesterday and left a note asking dr Jens Som if okay for her to hold her xarelto prior to having a back injection. Spoke with pt, per dr Jens Som she can hold the xarelto 2 days prior to the injection and she is to restart the day after the injection. Patient voiced understanding

## 2013-10-12 ENCOUNTER — Encounter: Payer: Self-pay | Admitting: Internal Medicine

## 2013-10-12 ENCOUNTER — Ambulatory Visit (INDEPENDENT_AMBULATORY_CARE_PROVIDER_SITE_OTHER): Payer: Medicare Other | Admitting: Internal Medicine

## 2013-10-12 VITALS — BP 122/80 | HR 62 | Temp 98.1°F | Wt 126.0 lb

## 2013-10-12 DIAGNOSIS — R609 Edema, unspecified: Secondary | ICD-10-CM

## 2013-10-12 DIAGNOSIS — R600 Localized edema: Secondary | ICD-10-CM

## 2013-10-12 DIAGNOSIS — Z23 Encounter for immunization: Secondary | ICD-10-CM

## 2013-10-12 DIAGNOSIS — I10 Essential (primary) hypertension: Secondary | ICD-10-CM

## 2013-10-12 DIAGNOSIS — E785 Hyperlipidemia, unspecified: Secondary | ICD-10-CM

## 2013-10-12 NOTE — Assessment & Plan Note (Signed)
Persistent stable, likely venous insuff, for compression stocking, low salt, wt control , reg exercise, prn hct

## 2013-10-12 NOTE — Progress Notes (Signed)
Subjective:    Patient ID: Ann Booth, female    DOB: 12/07/1926, 80 y.o.   MRN: [id number]  HPI    Here to f/u, still with persistent (but slightly improved) RLE edema below the knee, without knee effusion , leg pain, pelvic pain or other endorgan dysfunction. Echo with normal EF.   Pt denies chest pain, increased sob or doe, wheezing, orthopnea, PND, increased LE swelling, palpitations, dizziness or syncope.  Pt denies new neurological symptoms such as new headache, or facial or extremity weakness or numbness   Pt continues to have recurring LBP without change in severity, bowel or bladder change, fever, wt loss,  worsening LE pain/numbness/weakness, gait change or falls.  For ESI trial soon. Past Medical History  Diagnosis Date  . ALLERGIC RHINITIS   . ANXIETY   . ASTHMA   . BACK PAIN   . CATARACT NOS   . DEPRESSION   . GLAUCOMA NOS   . Hypercalcemia   . MITRAL VALVE PROLAPSE   . OSTEOPOROSIS   . PERIPHERAL NEUROPATHY   . POSITIONAL VERTIGO   . Rosacea   . HTN (hypertension)   . Carotid stenosis 04/27/2012    Mild left on community screening  - Nov 2013  . Atrial fibrillation    Past Surgical History  Procedure Laterality Date  . Cataract extraction, bilateral    . Posterior chamber interocular lens implant    . Hemorrhoid surgery    .  ankle surgury      left ankle    reports that she has never smoked. She has never used smokeless tobacco. She reports that she does not drink alcohol or use illicit drugs. family history includes CAD in her mother; Cancer in her brother and sister. No Known Allergies Current Outpatient Prescriptions on File Prior to Visit  Medication Sig Dispense Refill  . ALPRAZolam (XANAX) 0.5 MG tablet 1/2 - 1 tab by mouth at bedtime as needed  30 tablet  3  . apixaban (ELIQUIS) 2.5 MG TABS tablet Take 1 tablet (2.5 mg total) by mouth 2 (two) times daily.  60 tablet  6  . b complex vitamins tablet Take 1 tablet by mouth daily.      . Biotin 1 MG  CAPS Take 1 tablet by mouth daily.      . Calcium Carbonate-Vitamin D (CALCIUM + D PO) Take 1 tablet by mouth 2 (two) times daily.      . Coenzyme Q10 (COQ-10) 100 MG CAPS Take by mouth daily.      Marland Kitchen. glucosamine-chondroitin 500-400 MG tablet Take 2 tablets by mouth daily.      . hydrochlorothiazide (MICROZIDE) 12.5 MG capsule Take 1 capsule (12.5 mg total) by mouth daily.  30 capsule  11  . IRON, FERROUS GLUCONATE, PO Take 1 tablet by mouth every other day.      . losartan (COZAAR) 50 MG tablet Take 1 tablet (50 mg total) by mouth daily.  90 tablet  3  . LUTEIN PO Take 1 tablet by mouth daily.      . Magnesium Citrate 100 MG TABS Take by mouth daily.      . Multiple Vitamins-Minerals (MACULAR VITAMIN BENEFIT PO) Take 2 tablets by mouth daily.      . Multiple Vitamins-Minerals (MULTIVITAMIN & MINERAL PO) Take 1 tablet by mouth daily.      . Rivaroxaban (XARELTO) 15 MG TABS tablet Take 1 tablet (15 mg total) by mouth daily with supper.  90 tablet  3  . Thiamine HCl (B-1 PO) Take by mouth daily.      . traMADol-acetaminophen (ULTRACET) 37.5-325 MG per tablet Take 1 tablet by mouth every 6 (six) hours as needed.  120 tablet  2  . TRAVATAN Z 0.004 % SOLN ophthalmic solution as directed.      . vitamin B-12 (CYANOCOBALAMIN) 1000 MCG tablet Take 1,000 mcg by mouth daily.       No current facility-administered medications on file prior to visit.   Review of Systems  Constitutional: Negative for unusual diaphoresis or other sweats  HENT: Negative for ringing in ear Eyes: Negative for double vision or worsening visual disturbance.  Respiratory: Negative for choking and stridor.   Gastrointestinal: Negative for vomiting or other signifcant bowel change Genitourinary: Negative for hematuria or decreased urine volume.  Musculoskeletal: Negative for other MSK pain or swelling Skin: Negative for color change and worsening wound.  Neurological: Negative for tremors and numbness other than noted    Psychiatric/Behavioral: Negative for decreased concentration or agitation other than above       Objective:   Physical Exam BP 122/80  Pulse 62  Temp(Src) 98.1 F (36.7 C) (Oral)  Wt 126 lb (57.153 kg)  SpO2 93% VS noted,  Constitutional: Pt appears well-developed, well-nourished.  HENT: Head: NCAT.  Right Ear: External ear normal.  Left Ear: External ear normal.  Eyes: . Pupils are equal, round, and reactive to light. Conjunctivae and EOM are normal Neck: Normal range of motion. Neck supple.  Cardiovascular: Normal rate and regular rhythm.   Pulmonary/Chest: Effort normal and breath sounds normal.  Abd:  Soft, NT, ND, + BS Neurological: Pt is alert. Not confused , motor grossly intact Skin: Skin is warm. No rash, trace to 1+ edema to knee RLE, with mult varicosities Psychiatric: Pt behavior is normal. No agitation.      Assessment & Plan:

## 2013-10-12 NOTE — Patient Instructions (Addendum)
You had the flu shot today  Please try your sister's compression stocking for the right leg only.    OK to use the prescription we gave you today if this does not work out.  Please continue all other medications as before, and refills have been done if requested.  Please have the pharmacy call with any other refills you may need.

## 2013-10-12 NOTE — Progress Notes (Signed)
Pre visit review using our clinic review tool, if applicable. No additional management support is needed unless otherwise documented below in the visit note. 

## 2013-10-12 NOTE — Assessment & Plan Note (Signed)
stable overall by history and exam, recent data reviewed with pt, and pt to continue medical treatment as before,  to f/u any worsening symptoms or concerns Lab Results  Component Value Date   LDLCALC 68 07/28/2013

## 2013-10-12 NOTE — Assessment & Plan Note (Signed)
stable overall by history and exam, recent data reviewed with pt, and pt to continue medical treatment as before,  to f/u any worsening symptoms or concerns BP Readings from Last 3 Encounters:  10/12/13 122/80  07/28/13 130/72  06/30/13 140/61

## 2013-10-19 ENCOUNTER — Telehealth: Payer: Self-pay | Admitting: Cardiology

## 2013-10-19 NOTE — Telephone Encounter (Signed)
Spoke with pt, Aware of dr crenshaw's recommendations.  °

## 2013-10-19 NOTE — Telephone Encounter (Signed)
Dr. Ethelene Hal is going to do back injection - NOT yet scheduled. Would like for her to hold xarelto for 5 days prior. Please advise.

## 2013-10-19 NOTE — Telephone Encounter (Signed)
DC xarelto 3 days prior to procedure and resume the day after. Ann Booth

## 2013-10-19 NOTE — Telephone Encounter (Signed)
Ann Booth is calling to see if she can stop her Xarelto 5 days prior to an injection . Please Call   Thanks

## 2013-10-21 ENCOUNTER — Telehealth: Payer: Self-pay | Admitting: Cardiology

## 2013-10-21 NOTE — Telephone Encounter (Signed)
New message     Need to be off xeralto for 5 days in order to have a shot in her back.  Dr Ethelene Hal want her off xeralto for 5 days----Dr Jens Som already said she could be off of it for 3 days.  Please call.

## 2013-10-21 NOTE — Telephone Encounter (Signed)
Left message for pt of dr crenshaw's recommendations  

## 2013-10-21 NOTE — Telephone Encounter (Signed)
Will forward for dr crenshaw review  

## 2013-10-21 NOTE — Telephone Encounter (Signed)
Ok to hold for 5 days [facility]

## 2013-12-26 NOTE — Progress Notes (Signed)
HPI: FU atrial fibrillation. Monitor in August of 2014 showed PAF. Echo in August of 2014 showed normal LV function and trivial MR; mild LAE. I reviewed echo and felt MR may be underestimated due to MVP and eccentric jet. Toprol DCed previously due to bradycardia. Since I last saw her, She denies dyspnea, chest pain, palpitations or syncope.  Current Outpatient Prescriptions  Medication Sig Dispense Refill  . ALPRAZolam (XANAX) 0.5 MG tablet 1/2 - 1 tab by mouth at bedtime as needed 30 tablet 3  . b complex vitamins tablet Take 1 tablet by mouth daily.    . Biotin 1 MG CAPS Take 1 tablet by mouth daily.    . Calcium Carbonate-Vitamin D (CALCIUM + D PO) Take 1 tablet by mouth daily.     . Coenzyme Q10 (COQ-10) 100 MG CAPS Take by mouth daily.    Marland Kitchen glucosamine-chondroitin 500-400 MG tablet Take 2 tablets by mouth daily.    . hydrochlorothiazide (MICROZIDE) 12.5 MG capsule Take 1 capsule (12.5 mg total) by mouth daily. 30 capsule 11  . IRON, FERROUS GLUCONATE, PO Take 1 tablet by mouth every other day.    . losartan (COZAAR) 50 MG tablet Take 1 tablet (50 mg total) by mouth daily. 90 tablet 3  . LUTEIN PO Take 1 tablet by mouth daily.    . Magnesium Citrate 100 MG TABS Take by mouth daily.    . Rivaroxaban (XARELTO) 15 MG TABS tablet Take 1 tablet (15 mg total) by mouth daily with supper. 90 tablet 3  . Thiamine HCl (B-1 PO) Take by mouth daily.    . traMADol-acetaminophen (ULTRACET) 37.5-325 MG per tablet Take 1 tablet by mouth every 6 (six) hours as needed. 120 tablet 2  . TRAVATAN Z 0.004 % SOLN ophthalmic solution as directed.    . vitamin B-12 (CYANOCOBALAMIN) 1000 MCG tablet Take 1,000 mcg by mouth every other day.     . Multiple Vitamins-Minerals (MACULAR VITAMIN BENEFIT PO) Take 2 tablets by mouth daily.    . Multiple Vitamins-Minerals (MULTIVITAMIN & MINERAL PO) Take 1 tablet by mouth daily.     No current facility-administered medications for this visit.     Past Medical  History  Diagnosis Date  . ALLERGIC RHINITIS   . ANXIETY   . ASTHMA   . BACK PAIN   . CATARACT NOS   . DEPRESSION   . GLAUCOMA NOS   . Hypercalcemia   . MITRAL VALVE PROLAPSE   . OSTEOPOROSIS   . PERIPHERAL NEUROPATHY   . POSITIONAL VERTIGO   . Rosacea   . HTN (hypertension)   . Carotid stenosis 04/27/2012    Mild left on community screening  - Nov 2013  . Atrial fibrillation     Past Surgical History  Procedure Laterality Date  . Cataract extraction, bilateral    . Posterior chamber interocular lens implant    . Hemorrhoid surgery    .  ankle surgury      left ankle    History   Social History  . Marital Status: Widowed    Spouse Name: N/A    Number of Children: N/A  . Years of Education: N/A   Occupational History  . retired [employer]    Social History Main Topics  . Smoking status: Never Smoker   . Smokeless tobacco: Never Used  . Alcohol Use: No  . Drug Use: No  . Sexual Activity: Not on file   Other Topics Concern  .  Not on file   Social History Narrative    ROS: Back pain but no fevers or chills, productive cough, hemoptysis, dysphasia, odynophagia, melena, hematochezia, dysuria, hematuria, rash, seizure activity, orthopnea, PND, pedal edema, claudication. Remaining systems are negative.  Physical Exam: Well-developed well-nourished in no acute distress.  Skin is warm and dry.  HEENT is normal.  Neck is supple.  Chest is clear to auscultation with normal expansion.  Cardiovascular exam is regular rate and rhythm. 2/6 systolic murmur apex. Abdominal exam nontender or distended. No masses palpated. Extremities show no edema. neuro grossly intact  ECG Sinus rhythm at a rate of 68. PACs.

## 2013-12-29 ENCOUNTER — Ambulatory Visit (INDEPENDENT_AMBULATORY_CARE_PROVIDER_SITE_OTHER): Payer: Medicare Other | Admitting: Cardiology

## 2013-12-29 ENCOUNTER — Encounter: Payer: Self-pay | Admitting: Cardiology

## 2013-12-29 ENCOUNTER — Encounter: Payer: Self-pay | Admitting: *Deleted

## 2013-12-29 VITALS — BP 135/70 | HR 65 | Ht 62.0 in | Wt 122.0 lb

## 2013-12-29 DIAGNOSIS — I480 Paroxysmal atrial fibrillation: Secondary | ICD-10-CM

## 2013-12-29 DIAGNOSIS — I10 Essential (primary) hypertension: Secondary | ICD-10-CM

## 2013-12-29 DIAGNOSIS — I059 Rheumatic mitral valve disease, unspecified: Secondary | ICD-10-CM

## 2013-12-29 NOTE — Assessment & Plan Note (Signed)
Patient remains in sinus rhythm. Beta blocker discontinued previously because of bradycardia. Continue xarelto; check hemoglobin and renal function.

## 2013-12-29 NOTE — Assessment & Plan Note (Signed)
Plan repeat echo to reassess MR.

## 2013-12-29 NOTE — Assessment & Plan Note (Signed)
BP controlled; continue present meds. 

## 2013-12-29 NOTE — Patient Instructions (Signed)
Your physician wants you to follow-up in: 6 MONTHS WITH DR Jens Som You will receive a reminder letter in the mail two months in advance. If you don't receive a letter, please call our office to schedule the follow-up appointment.   Your physician has requested that you have an echocardiogram. Echocardiography is a painless test that uses sound waves to create images of your heart. It provides your doctor with information about the size and shape of your heart and how well your heart's chambers and valves are working. This procedure takes approximately one hour. There are no restrictions for this procedure.   Your physician recommends that you return for lab work WITH ECHO

## 2014-01-02 ENCOUNTER — Ambulatory Visit (HOSPITAL_COMMUNITY)
Admission: RE | Admit: 2014-01-02 | Discharge: 2014-01-02 | Disposition: A | Discharge status: Home / Self Care | Payer: Medicare Other | Source: Ambulatory Visit | Attending: Cardiology | Admitting: Cardiology

## 2014-01-02 DIAGNOSIS — I480 Paroxysmal atrial fibrillation: Secondary | ICD-10-CM

## 2014-01-02 DIAGNOSIS — I10 Essential (primary) hypertension: Secondary | ICD-10-CM | POA: Diagnosis not present

## 2014-01-02 DIAGNOSIS — I349 Nonrheumatic mitral valve disorder, unspecified: Secondary | ICD-10-CM | POA: Insufficient documentation

## 2014-01-02 DIAGNOSIS — I059 Rheumatic mitral valve disease, unspecified: Secondary | ICD-10-CM

## 2014-01-02 LAB — BASIC METABOLIC PANEL WITH GFR
BUN: 19 mg/dL (ref 6–23)
CALCIUM: 9.7 mg/dL (ref 8.4–10.5)
CO2: 28 mEq/L (ref 19–32)
CREATININE: 0.85 mg/dL (ref 0.50–1.10)
Chloride: 105 mEq/L (ref 96–112)
GFR, EST NON AFRICAN AMERICAN: 63 mL/min
GFR, Est African American: 72 mL/min
Glucose, Bld: 83 mg/dL (ref 70–99)
Potassium: 4.2 mEq/L (ref 3.5–5.3)
Sodium: 142 mEq/L (ref 135–145)

## 2014-01-02 LAB — CBC
HCT: 37.9 % (ref 36.0–46.0)
Hemoglobin: 12.6 g/dL (ref 12.0–15.0)
MCH: 32 pg (ref 26.0–34.0)
MCHC: 33.2 g/dL (ref 30.0–36.0)
MCV: 96.2 fL (ref 78.0–100.0)
PLATELETS: 185 10*3/uL (ref 150–400)
RBC: 3.94 MIL/uL (ref 3.87–5.11)
RDW: 13.8 % (ref 11.5–15.5)
WBC: 3.6 10*3/uL — ABNORMAL LOW (ref 4.0–10.5)

## 2014-01-02 NOTE — Progress Notes (Signed)
2D Echocardiogram Complete.  01/02/2014   Mika Anastasi, RDCS  

## 2014-02-02 ENCOUNTER — Encounter: Payer: Self-pay | Admitting: Internal Medicine

## 2014-02-02 ENCOUNTER — Ambulatory Visit (INDEPENDENT_AMBULATORY_CARE_PROVIDER_SITE_OTHER): Payer: Medicare Other | Admitting: Internal Medicine

## 2014-02-02 VITALS — BP 128/80 | HR 74 | Temp 97.4°F | Ht 62.0 in | Wt 124.0 lb

## 2014-02-02 DIAGNOSIS — G8929 Other chronic pain: Secondary | ICD-10-CM

## 2014-02-02 DIAGNOSIS — I10 Essential (primary) hypertension: Secondary | ICD-10-CM

## 2014-02-02 DIAGNOSIS — Z0189 Encounter for other specified special examinations: Secondary | ICD-10-CM

## 2014-02-02 DIAGNOSIS — M545 Low back pain: Secondary | ICD-10-CM

## 2014-02-02 DIAGNOSIS — F411 Generalized anxiety disorder: Secondary | ICD-10-CM

## 2014-02-02 DIAGNOSIS — Z0000 Encounter for general adult medical examination without abnormal findings: Secondary | ICD-10-CM

## 2014-02-02 MED ORDER — TRAMADOL HCL 50 MG PO TABS: 50.0000 mg | ORAL_TABLET | Freq: Three times a day (TID) | ORAL | Status: DC | PRN

## 2014-02-02 NOTE — Assessment & Plan Note (Signed)
stable overall by history and exam, recent data reviewed with pt, and pt to continue medical treatment as before,  to f/u any worsening symptoms or concerns BP Readings from Last 3 Encounters:  02/02/14 128/80  12/29/13 135/70  10/12/13 122/80

## 2014-02-02 NOTE — Assessment & Plan Note (Signed)
Chronic, ok for ultracet change to tramadol 50 mg prn,  to f/u any worsening symptoms or concerns

## 2014-02-02 NOTE — Patient Instructions (Signed)
Ok for the change of ultracet to tramadol   Please continue all other medications as before, and refills have been done if requested.  Please have the pharmacy call with any other refills you may need.  Please continue your efforts at being more active, low cholesterol diet, and weight control.  You are otherwise up to date with prevention measures today.  Please keep your appointments with your specialists as you may have planned  No further lab work needed today  Please return in 6 months, or sooner if needed, with Lab testing done 3-5 days before

## 2014-02-02 NOTE — Progress Notes (Signed)
Subjective:    Patient ID: Ann Booth, female    DOB: 05/15/1926, [age] y.o.   MRN: [id number]  HPI  Here to f/u;  Pt continues to have recurring LBP without change in severity, bowel or bladder change, fever, wt loss,  worsening LE pain/numbness/weakness, gait change or falls, asks for increased from ultracet to tramadol 50 prn, has no other options for surgury or ESI or PT.  Denies worsening depressive symptoms, suicidal ideation, or panic; has ongoing anxiety, not increased recently.  No voert bleeding or bruising on the xarelto.  Has veritgo like sensation occas, takes meclizine prn., no recent falls but balance is more of an issue recently.  Pt denies fever, wt loss, night sweats, loss of appetite, or other constitutional symptoms  Past Medical History  Diagnosis Date  . ALLERGIC RHINITIS   . ANXIETY   . ASTHMA   . BACK PAIN   . CATARACT NOS   . DEPRESSION   . GLAUCOMA NOS   . Hypercalcemia   . MITRAL VALVE PROLAPSE   . OSTEOPOROSIS   . PERIPHERAL NEUROPATHY   . POSITIONAL VERTIGO   . Rosacea   . HTN (hypertension)   . Carotid stenosis 04/27/2012    Mild left on community screening  - Nov 2013  . Atrial fibrillation    Past Surgical History  Procedure Laterality Date  . Cataract extraction, bilateral    . Posterior chamber interocular lens implant    . Hemorrhoid surgery    .  ankle surgury      left ankle    reports that she has never smoked. She has never used smokeless tobacco. She reports that she does not drink alcohol or use illicit drugs. family history includes CAD in her mother; Cancer in her brother and sister. No Known Allergies Current Outpatient Prescriptions on File Prior to Visit  Medication Sig Dispense Refill  . ALPRAZolam (XANAX) 0.5 MG tablet 1/2 - 1 tab by mouth at bedtime as needed 30 tablet 3  . b complex vitamins tablet Take 1 tablet by mouth daily.    . Biotin 1 MG CAPS Take 1 tablet by mouth daily.    . Calcium Carbonate-Vitamin D (CALCIUM + D  PO) Take 1 tablet by mouth daily.     . Coenzyme Q10 (COQ-10) 100 MG CAPS Take by mouth daily.    Marland Kitchen. glucosamine-chondroitin 500-400 MG tablet Take 2 tablets by mouth daily.    . hydrochlorothiazide (MICROZIDE) 12.5 MG capsule Take 1 capsule (12.5 mg total) by mouth daily. 30 capsule 11  . IRON, FERROUS GLUCONATE, PO Take 1 tablet by mouth every other day.    . losartan (COZAAR) 50 MG tablet Take 1 tablet (50 mg total) by mouth daily. 90 tablet 3  . LUTEIN PO Take 1 tablet by mouth daily.    . Magnesium Citrate 100 MG TABS Take by mouth daily.    . Rivaroxaban (XARELTO) 15 MG TABS tablet Take 1 tablet (15 mg total) by mouth daily with supper. 90 tablet 3  . Thiamine HCl (B-1 PO) Take by mouth daily.    . TRAVATAN Z 0.004 % SOLN ophthalmic solution as directed.    . vitamin B-12 (CYANOCOBALAMIN) 1000 MCG tablet Take 1,000 mcg by mouth every other day.     . Multiple Vitamins-Minerals (MACULAR VITAMIN BENEFIT PO) Take 2 tablets by mouth daily.    . Multiple Vitamins-Minerals (MULTIVITAMIN & MINERAL PO) Take 1 tablet by mouth daily.     No  current facility-administered medications on file prior to visit.    Review of Systems  Constitutional: Negative for unusual diaphoresis or other sweats  HENT: Negative for ringing in ear Eyes: Negative for double vision or worsening visual disturbance.  Respiratory: Negative for choking and stridor.   Gastrointestinal: Negative for vomiting or other signifcant bowel change Genitourinary: Negative for hematuria or decreased urine volume.  Musculoskeletal: Negative for other MSK pain or swelling Skin: Negative for color change and worsening wound.  Neurological: Negative for tremors and numbness other than noted  Psychiatric/Behavioral: Negative for decreased concentration or agitation other than above       Objective:   Physical Exam BP 128/80 mmHg  Pulse 74  Temp(Src) 97.4 F (36.3 C) (Oral)  Ht 5\' 2"  (1.575 m)  Wt 124 lb (56.246 kg)  BMI 22.67  kg/m2  SpO2 96% VS noted,  Constitutional: Pt appears well-developed, well-nourished.but on thin side, elderly but not overly frail  HENT: Head: NCAT.  Right Ear: External ear normal.  Left Ear: External ear normal.  Eyes: . Pupils are equal, round, and reactive to light. Conjunctivae and EOM are normal Neck: Normal range of motion. Neck supple.  Cardiovascular: Normal rate and irregular rhythm.   Pulmonary/Chest: Effort normal and breath sounds normal.  Neurological: Pt is alert. Not confused , motor grossly intact Skin: Skin is warm. No rash Psychiatric: Pt behavior is normal. No agitation.     Assessment & Plan:

## 2014-02-02 NOTE — Assessment & Plan Note (Signed)
stable overall by history and exam, recent data reviewed with pt, and pt to continue medical treatment as before,  to f/u any worsening symptoms or concerns Lab Results  Component Value Date   WBC 3.6* 01/02/2014   HGB 12.6 01/02/2014   HCT 37.9 01/02/2014   PLT 185 01/02/2014   GLUCOSE 83 01/02/2014   CHOL 181 07/28/2013   TRIG 102.0 07/28/2013   HDL 92.90 07/28/2013   LDLCALC 68 07/28/2013   ALT 25 07/28/2013   AST 32 07/28/2013   NA 142 01/02/2014   K 4.2 01/02/2014   CL 105 01/02/2014   CREATININE 0.85 01/02/2014   BUN 19 01/02/2014   CO2 28 01/02/2014   TSH 1.12 07/28/2013   INR 1.09 01/24/2013

## 2014-02-27 ENCOUNTER — Telehealth: Payer: Self-pay | Admitting: Internal Medicine

## 2014-02-27 ENCOUNTER — Encounter: Payer: Self-pay | Admitting: Cardiology

## 2014-02-27 NOTE — Telephone Encounter (Signed)
Patient would like to know if she has hypertension.

## 2014-02-27 NOTE — Telephone Encounter (Signed)
Please call,she need ask you a question.Pt is trying to fill out a form.

## 2014-02-27 NOTE — Telephone Encounter (Signed)
This encounter was created in error - please disregard.

## 2014-02-28 NOTE — Telephone Encounter (Signed)
Patient informed that yes hypertension is on her problem list.

## 2014-03-21 ENCOUNTER — Other Ambulatory Visit: Payer: Self-pay | Admitting: Internal Medicine

## 2014-03-22 ENCOUNTER — Other Ambulatory Visit: Payer: Self-pay

## 2014-03-22 MED ORDER — TRAMADOL HCL 50 MG PO TABS: 50.0000 mg | ORAL_TABLET | Freq: Three times a day (TID) | ORAL | Status: DC | PRN

## 2014-03-22 NOTE — Telephone Encounter (Signed)
Done hardcopy to [facility]  

## 2014-03-23 NOTE — Telephone Encounter (Signed)
Faxed hardcopy for Tramadol to [facility] [address] [place] 

## 2014-04-05 ENCOUNTER — Telehealth: Payer: Self-pay | Admitting: Cardiology

## 2014-04-05 DIAGNOSIS — I4891 Unspecified atrial fibrillation: Secondary | ICD-10-CM

## 2014-04-05 MED ORDER — RIVAROXABAN 15 MG PO TABS: 15.0000 mg | ORAL_TABLET | Freq: Every day | ORAL | Status: DC

## 2014-04-05 NOTE — Telephone Encounter (Signed)
Pt called in stating that she needs a new prescription for Xarelto called in to the [facility] on [address]. Please call  Thanks

## 2014-04-05 NOTE — Telephone Encounter (Signed)
Refill submitted to patient's preferred pharmacy. Informed patient. Pt voiced understanding, no other stated concerns at this time.  

## 2014-04-24 ENCOUNTER — Telehealth: Payer: Self-pay | Admitting: Internal Medicine

## 2014-04-24 DIAGNOSIS — I10 Essential (primary) hypertension: Secondary | ICD-10-CM

## 2014-04-24 MED ORDER — LOSARTAN POTASSIUM 50 MG PO TABS: 50.0000 mg | ORAL_TABLET | Freq: Every day | ORAL | Status: DC

## 2014-04-24 NOTE — Telephone Encounter (Signed)
Refill sent to [facility].../lmb 

## 2014-04-24 NOTE — Telephone Encounter (Signed)
Pt called in requesting refill for  losartan (COZAAR) 50 MG tablet [[id number]]       losartan (COZAAR) 50 MG tablet [[id number]]        Pharmacy on file

## 2014-04-28 ENCOUNTER — Telehealth: Payer: Self-pay | Admitting: *Deleted

## 2014-04-28 NOTE — Telephone Encounter (Signed)
Received office from dr bond asking if okay for patient to use timolol eye drops for her glaucoma. Note reviewed by dr Jens Som and okay was given for patient to use. Message relayed to dr bond's office.

## 2014-05-30 ENCOUNTER — Telehealth: Payer: Self-pay | Admitting: Internal Medicine

## 2014-05-30 NOTE — Telephone Encounter (Signed)
Allegra 180 qd prn, and nasacort aq together usually work well with low side effect

## 2014-05-30 NOTE — Telephone Encounter (Signed)
Pt called in said that she was outside and her allergies has gotten really bad.  She wants to know what she can take over the counter that will help.     Best number [phone]

## 2014-05-30 NOTE — Telephone Encounter (Signed)
Notified pt with md response.../lmb 

## 2014-07-10 NOTE — Progress Notes (Signed)
HPI: FU atrial fibrillation. Monitor in August of 2014 showed PAF. Toprol DCed previously due to bradycardia. Echocardiogram in November 2015 showed normal LV function, grade 1 diastolic dysfunction, mild mitral regurgitation with systolic prolapse of anterior mitral valve leaflet. Since I last saw her, the patient denies any dyspnea on exertion, orthopnea, PND, pedal edema, palpitations, syncope or chest pain.   Current Outpatient Prescriptions  Medication Sig Dispense Refill  . ALPRAZolam (XANAX) 0.5 MG tablet 1/2 - 1 tab by mouth at bedtime as needed 30 tablet 3  . b complex vitamins tablet Take 1 tablet by mouth daily.    . Biotin 1 MG CAPS Take 1 tablet by mouth daily.    . Calcium Carbonate-Vitamin D (CALCIUM + D PO) Take 1 tablet by mouth daily.     . Coenzyme Q10 (COQ-10) 100 MG CAPS Take by mouth daily.    Marland Kitchen glucosamine-chondroitin 500-400 MG tablet Take 2 tablets by mouth daily.    . IRON, FERROUS GLUCONATE, PO Take 1 tablet by mouth every other day.    . losartan (COZAAR) 50 MG tablet Take 1 tablet (50 mg total) by mouth daily. 90 tablet 1  . LUTEIN PO Take 1 tablet by mouth daily.    . Magnesium Citrate 100 MG TABS Take by mouth daily.    . Multiple Vitamins-Minerals (MACULAR VITAMIN BENEFIT PO) Take 2 tablets by mouth daily.    . Multiple Vitamins-Minerals (MULTIVITAMIN & MINERAL PO) Take 1 tablet by mouth daily.    . Rivaroxaban (XARELTO) 15 MG TABS tablet Take 1 tablet (15 mg total) by mouth daily with supper. 90 tablet 0  . Thiamine HCl (B-1 PO) Take by mouth daily.    . timolol (BETIMOL) 0.5 % ophthalmic solution Place 1 drop into both eyes daily.    . traMADol (ULTRAM) 50 MG tablet Take 1 tablet (50 mg total) by mouth every 8 (eight) hours as needed. 90 tablet 2  . vitamin B-12 (CYANOCOBALAMIN) 1000 MCG tablet Take 1,000 mcg by mouth every other day.      No current facility-administered medications for this visit.     Past Medical History  Diagnosis Date  .  ALLERGIC RHINITIS   . ANXIETY   . ASTHMA   . BACK PAIN   . CATARACT NOS   . DEPRESSION   . GLAUCOMA NOS   . Hypercalcemia   . MITRAL VALVE PROLAPSE   . OSTEOPOROSIS   . PERIPHERAL NEUROPATHY   . POSITIONAL VERTIGO   . Rosacea   . HTN (hypertension)   . Carotid stenosis 04/27/2012    Mild left on community screening  - Nov 2013  . Atrial fibrillation     Past Surgical History  Procedure Laterality Date  . Cataract extraction, bilateral    . Posterior chamber interocular lens implant    . Hemorrhoid surgery    .  ankle surgury      left ankle    History   Social History  . Marital Status: Widowed    Spouse Name: N/A  . Number of Children: N/A  . Years of Education: N/A   Occupational History  . retired [employer]    Social History Main Topics  . Smoking status: Never Smoker   . Smokeless tobacco: Never Used  . Alcohol Use: No  . Drug Use: No  . Sexual Activity: Not on file   Other Topics Concern  . Not on file   Social History Narrative  ROS: back pain but no fevers or chills, productive cough, hemoptysis, dysphasia, odynophagia, melena, hematochezia, dysuria, hematuria, rash, seizure activity, orthopnea, PND, pedal edema, claudication. Remaining systems are negative.  Physical Exam: Well-developed well-nourished in no acute distress.  Skin is warm and dry.  HEENT is normal.  Neck is supple.  Chest is clear to auscultation with normal expansion.  Cardiovascular exam is regular rate and rhythm. 2/6 systolic murmur apex Abdominal exam nontender or distended. No masses palpated. Extremities show no edema. neuro grossly intact

## 2014-07-14 ENCOUNTER — Ambulatory Visit (INDEPENDENT_AMBULATORY_CARE_PROVIDER_SITE_OTHER): Payer: PPO | Admitting: Cardiology

## 2014-07-14 ENCOUNTER — Encounter: Payer: Self-pay | Admitting: Cardiology

## 2014-07-14 VITALS — BP 164/72 | HR 64 | Ht 62.0 in | Wt 123.0 lb

## 2014-07-14 DIAGNOSIS — I10 Essential (primary) hypertension: Secondary | ICD-10-CM

## 2014-07-14 DIAGNOSIS — I059 Rheumatic mitral valve disease, unspecified: Secondary | ICD-10-CM

## 2014-07-14 DIAGNOSIS — Z79899 Other long term (current) drug therapy: Secondary | ICD-10-CM

## 2014-07-14 DIAGNOSIS — I480 Paroxysmal atrial fibrillation: Secondary | ICD-10-CM | POA: Diagnosis not present

## 2014-07-14 NOTE — Assessment & Plan Note (Signed)
Blood pressure is mildly elevated. I have asked her to bring her cuff to the office to make sure that it correlates with ours. She will follow her blood pressure and we will increase her Cozaar if needed.

## 2014-07-14 NOTE — Patient Instructions (Signed)
Your physician recommends that you schedule a follow-up appointment in: 6 Months  Your physician recommends that you return for lab work CBC and BMP  Your physician recommends that you schedule a follow-up appointment Nurse schedule for Blood Pressure check

## 2014-07-14 NOTE — Assessment & Plan Note (Signed)
Mild mitral regurgitation on most recent echocardiogram.

## 2014-07-14 NOTE — Assessment & Plan Note (Signed)
Patient remains in sinus rhythm on examination. Continue xarelto; check hemoglobin and renal function.

## 2014-07-15 LAB — BASIC METABOLIC PANEL
BUN: 16 mg/dL (ref 6–23)
CALCIUM: 10 mg/dL (ref 8.4–10.5)
CO2: 27 mEq/L (ref 19–32)
CREATININE: 0.68 mg/dL (ref 0.50–1.10)
Chloride: 105 mEq/L (ref 96–112)
Glucose, Bld: 91 mg/dL (ref 70–99)
Potassium: 4.3 mEq/L (ref 3.5–5.3)
SODIUM: 141 meq/L (ref 135–145)

## 2014-07-15 LAB — CBC
HEMATOCRIT: 38.1 % (ref 36.0–46.0)
HEMOGLOBIN: 12.7 g/dL (ref 12.0–15.0)
MCH: 31.7 pg (ref 26.0–34.0)
MCHC: 33.3 g/dL (ref 30.0–36.0)
MCV: 95 fL (ref 78.0–100.0)
MPV: 11.5 fL (ref 8.6–12.4)
Platelets: 200 10*3/uL (ref 150–400)
RBC: 4.01 MIL/uL (ref 3.87–5.11)
RDW: 13.9 % (ref 11.5–15.5)
WBC: 3.8 10*3/uL — ABNORMAL LOW (ref 4.0–10.5)

## 2014-07-20 ENCOUNTER — Telehealth: Payer: Self-pay | Admitting: Cardiology

## 2014-07-20 ENCOUNTER — Other Ambulatory Visit: Payer: Self-pay

## 2014-07-20 DIAGNOSIS — I4891 Unspecified atrial fibrillation: Secondary | ICD-10-CM

## 2014-07-20 MED ORDER — RIVAROXABAN 15 MG PO TABS: 15.0000 mg | ORAL_TABLET | Freq: Every day | ORAL | Status: DC

## 2014-07-20 NOTE — Telephone Encounter (Signed)
°  1. Which medications need to be refilled? Xarelto-please call today 2. Which pharmacy is medication to be sent to?[facility]-[phone]  3. Do they need a 30 day or 90 day supply? 90 and refills  4. Would they like a call back once the medication has been sent to the pharmacy?

## 2014-07-20 NOTE — Telephone Encounter (Signed)
Refilled Xarelto for one year.

## 2014-08-08 ENCOUNTER — Encounter: Payer: Self-pay | Admitting: Internal Medicine

## 2014-08-08 ENCOUNTER — Other Ambulatory Visit (INDEPENDENT_AMBULATORY_CARE_PROVIDER_SITE_OTHER): Payer: PPO

## 2014-08-08 ENCOUNTER — Ambulatory Visit (INDEPENDENT_AMBULATORY_CARE_PROVIDER_SITE_OTHER): Payer: PPO | Admitting: Internal Medicine

## 2014-08-08 VITALS — BP 148/80 | HR 52 | Temp 97.8°F | Ht 62.0 in | Wt 122.0 lb

## 2014-08-08 DIAGNOSIS — G8929 Other chronic pain: Secondary | ICD-10-CM | POA: Diagnosis not present

## 2014-08-08 DIAGNOSIS — I10 Essential (primary) hypertension: Secondary | ICD-10-CM | POA: Diagnosis not present

## 2014-08-08 DIAGNOSIS — E785 Hyperlipidemia, unspecified: Secondary | ICD-10-CM | POA: Diagnosis not present

## 2014-08-08 DIAGNOSIS — M545 Low back pain: Secondary | ICD-10-CM

## 2014-08-08 LAB — CBC WITH DIFFERENTIAL/PLATELET
Basophils Absolute: 0 10*3/uL (ref 0.0–0.1)
Basophils Relative: 1.1 % (ref 0.0–3.0)
EOS ABS: 0.2 10*3/uL (ref 0.0–0.7)
Eosinophils Relative: 4.7 % (ref 0.0–5.0)
HEMATOCRIT: 38.7 % (ref 36.0–46.0)
Hemoglobin: 13 g/dL (ref 12.0–15.0)
LYMPHS ABS: 1.3 10*3/uL (ref 0.7–4.0)
Lymphocytes Relative: 31.6 % (ref 12.0–46.0)
MCHC: 33.6 g/dL (ref 30.0–36.0)
MCV: 94.8 fl (ref 78.0–100.0)
Monocytes Absolute: 0.4 10*3/uL (ref 0.1–1.0)
Monocytes Relative: 11.2 % (ref 3.0–12.0)
NEUTROS PCT: 51.4 % (ref 43.0–77.0)
Neutro Abs: 2.1 10*3/uL (ref 1.4–7.7)
PLATELETS: 185 10*3/uL (ref 150.0–400.0)
RBC: 4.09 Mil/uL (ref 3.87–5.11)
RDW: 14.5 % (ref 11.5–15.5)
WBC: 4 10*3/uL (ref 4.0–10.5)

## 2014-08-08 LAB — URINALYSIS, ROUTINE W REFLEX MICROSCOPIC
BILIRUBIN URINE: NEGATIVE
Hgb urine dipstick: NEGATIVE
Ketones, ur: NEGATIVE
LEUKOCYTES UA: NEGATIVE
Nitrite: NEGATIVE
SPECIFIC GRAVITY, URINE: 1.015 (ref 1.000–1.030)
Total Protein, Urine: NEGATIVE
URINE GLUCOSE: NEGATIVE
UROBILINOGEN UA: 0.2 (ref 0.0–1.0)
pH: 7.5 (ref 5.0–8.0)

## 2014-08-08 LAB — BASIC METABOLIC PANEL
BUN: 19 mg/dL (ref 6–23)
CO2: 31 meq/L (ref 19–32)
Calcium: 10.5 mg/dL (ref 8.4–10.5)
Chloride: 103 mEq/L (ref 96–112)
Creatinine, Ser: 0.7 mg/dL (ref 0.40–1.20)
GFR: 84.24 mL/min (ref >60.00)
Glucose, Bld: 89 mg/dL (ref 70–99)
Potassium: 4.1 mEq/L (ref 3.5–5.1)
Sodium: 138 mEq/L (ref 135–145)

## 2014-08-08 LAB — HEPATIC FUNCTION PANEL
ALBUMIN: 4.2 g/dL (ref 3.5–5.2)
ALT: 18 U/L (ref 0–35)
AST: 31 U/L (ref 0–37)
Alkaline Phosphatase: 63 U/L (ref 39–117)
Bilirubin, Direct: 0.1 mg/dL (ref 0.0–0.3)
TOTAL PROTEIN: 7.3 g/dL (ref 6.0–8.3)
Total Bilirubin: 0.5 mg/dL (ref 0.2–1.2)

## 2014-08-08 LAB — LIPID PANEL
CHOL/HDL RATIO: 2
Cholesterol: 167 mg/dL (ref 0–200)
HDL: 85.1 mg/dL (ref >39.00)
LDL CALC: 62 mg/dL (ref 0–99)
NONHDL: 81.9
Triglycerides: 101 mg/dL (ref 0.0–149.0)
VLDL: 20.2 mg/dL (ref 0.0–40.0)

## 2014-08-08 LAB — TSH: TSH: 0.96 u[IU]/mL (ref 0.35–4.50)

## 2014-08-08 MED ORDER — CELECOXIB 100 MG PO CAPS: 100.0000 mg | ORAL_CAPSULE | Freq: Two times a day (BID) | ORAL | Status: DC | PRN

## 2014-08-08 MED ORDER — ALPRAZOLAM 0.5 MG PO TABS: ORAL_TABLET | ORAL | Status: DC

## 2014-08-08 NOTE — Progress Notes (Signed)
Pre visit review using our clinic review tool, if applicable. No additional management support is needed unless otherwise documented below in the visit note. 

## 2014-08-08 NOTE — Patient Instructions (Signed)
Please take all new medication as prescribed  - the celebrex as needed for pain  Please continue all other medications as before, and refills have been done if requested.  Please have the pharmacy call with any other refills you may need.  Please continue your efforts at being more active, low cholesterol diet, and weight control.  You are otherwise up to date with prevention measures today.  Please keep your appointments with your specialists as you may have planned  Please go to the LAB in the Basement (turn left off the elevator) for the tests to be done today  You will be contacted by phone if any changes need to be made immediately.  Otherwise, you will receive a letter about your results with an explanation, but please check with MyChart first.  Please remember to sign up for MyChart if you have not done so, as this will be important to you in the future with finding out test results, communicating by private email, and scheduling acute appointments online when needed.  Please return in 6 months, or sooner if needed

## 2014-08-08 NOTE — Progress Notes (Signed)
Subjective:    Patient ID: Ann Booth, female    DOB: 1926-10-20, 81 y.o.   MRN: [id number]  HPI    Here for yearly f/u;  Overall doing ok;  Pt denies Chest pain, worsening SOB, DOE, wheezing, orthopnea, PND, worsening LE edema, palpitations, dizziness or syncope.  Pt denies neurological change such as new headache, facial or extremity weakness.  Pt denies polydipsia, polyuria, or low sugar symptoms. Pt states overall good compliance with treatment and medications, good tolerability, and has been trying to follow appropriate diet.  Pt denies worsening depressive symptoms, suicidal ideation or panic. No fever, night sweats, wt loss, loss of appetite, or other constitutional symptoms.  Pt states good ability with ADL's, has low fall risk, home safety reviewed and adequate, no other significant changes in hearing or vision, and only occasionally active with exercise.  Friend died, and dog sick - more stress today. Does have some neuropathic pain but declines blood work. Also- Pt continues to have recurring LBP without change in severity, bowel or bladder change, fever, wt loss,  worsening LE pain/numbness/weakness, gait change or falls. Has seen [facility] [facility] and [facility] for back pain and not candidate for surgury, but did have some relief with chiropracter recently  Back pain now improved, tramadol has helped in the past, but believes it makes her dizzy. Advised for OK for rare allevel per pt per cardiology, but not for regular use.   Past Medical History  Diagnosis Date  . ALLERGIC RHINITIS   . ANXIETY   . ASTHMA   . BACK PAIN   . CATARACT NOS   . DEPRESSION   . GLAUCOMA NOS   . Hypercalcemia   . MITRAL VALVE PROLAPSE   . OSTEOPOROSIS   . PERIPHERAL NEUROPATHY   . POSITIONAL VERTIGO   . Rosacea   . HTN (hypertension)   . Carotid stenosis 04/27/2012    Mild left on community screening  - Nov 2013  . Atrial fibrillation    Past Surgical History  Procedure Laterality Date  .  Cataract extraction, bilateral    . Posterior chamber interocular lens implant    . Hemorrhoid surgery    .  ankle surgury      left ankle    reports that she has never smoked. She has never used smokeless tobacco. She reports that she does not drink alcohol or use illicit drugs. family history includes CAD in her mother; Cancer in her brother and sister. No Known Allergies Current Outpatient Prescriptions on File Prior to Visit  Medication Sig Dispense Refill  . ALPRAZolam (XANAX) 0.5 MG tablet 1/2 - 1 tab by mouth at bedtime as needed 30 tablet 3  . b complex vitamins tablet Take 1 tablet by mouth daily.    . Biotin 1 MG CAPS Take 1 tablet by mouth daily.    . Calcium Carbonate-Vitamin D (CALCIUM + D PO) Take 1 tablet by mouth daily.     . Coenzyme Q10 (COQ-10) 100 MG CAPS Take by mouth daily.    Marland Kitchen glucosamine-chondroitin 500-400 MG tablet Take 2 tablets by mouth daily.    . IRON, FERROUS GLUCONATE, PO Take 1 tablet by mouth every other day.    . losartan (COZAAR) 50 MG tablet Take 1 tablet (50 mg total) by mouth daily. 90 tablet 1  . LUTEIN PO Take 1 tablet by mouth daily.    . Magnesium Citrate 100 MG TABS Take by mouth daily.    . Multiple Vitamins-Minerals (  MACULAR VITAMIN BENEFIT PO) Take 2 tablets by mouth daily.    . Multiple Vitamins-Minerals (MULTIVITAMIN & MINERAL PO) Take 1 tablet by mouth daily.    . Rivaroxaban (XARELTO) 15 MG TABS tablet Take 1 tablet (15 mg total) by mouth daily with supper. 90 tablet 3  . Thiamine HCl (B-1 PO) Take by mouth daily.    . timolol (BETIMOL) 0.5 % ophthalmic solution Place 1 drop into both eyes daily.    . traMADol (ULTRAM) 50 MG tablet Take 1 tablet (50 mg total) by mouth every 8 (eight) hours as needed. 90 tablet 2  . vitamin B-12 (CYANOCOBALAMIN) 1000 MCG tablet Take 1,000 mcg by mouth every other day.      No current facility-administered medications on file prior to visit.    Review of Systems Constitutional: Negative for increased  diaphoresis, other activity, appetite or siginficant weight change other than noted HENT: Negative for worsening hearing loss, ear pain, facial swelling, mouth sores and neck stiffness.   Eyes: Negative for other worsening pain, redness or visual disturbance.  Respiratory: Negative for shortness of breath and wheezing  Cardiovascular: Negative for chest pain and palpitations.  Gastrointestinal: Negative for diarrhea, blood in stool, abdominal distention or other pain Genitourinary: Negative for hematuria, flank pain or change in urine volume.  Musculoskeletal: Negative for myalgias or other joint complaints.  Skin: Negative for color change and wound or drainage.  Neurological: Negative for syncope and numbness. other than noted Hematological: Negative for adenopathy. or other swelling Psychiatric/Behavioral: Negative for hallucinations, SI, self-injury, decreased concentration or other worsening agitation.      Objective:   Physical Exam BP 148/80 mmHg  Pulse 52  Temp(Src) 97.8 F (36.6 C) (Oral)  Ht 5\' 2"  (1.575 m)  Wt 122 lb (55.339 kg)  BMI 22.31 kg/m2  SpO2 97% VS noted,  Constitutional: Pt is oriented to person, place, and time. Appears well-developed and well-nourished, in no significant distress Head: Normocephalic and atraumatic.  Right Ear: External ear normal.  Left Ear: External ear normal.  Nose: Nose normal.  Mouth/Throat: Oropharynx is clear and moist.  Eyes: Conjunctivae and EOM are normal. Pupils are equal, round, and reactive to light.  Neck: Normal range of motion. Neck supple. No JVD present. No tracheal deviation present or significant neck LA or mass Cardiovascular: Normal rate, regular rhythm, normal heart sounds and intact distal pulses.   Pulmonary/Chest: Effort normal and breath sounds without rales or wheezing  Abdominal: Soft. Bowel sounds are normal. NT. No HSM  Musculoskeletal: Normal range of motion. Exhibits no edema.  Has diffuse mild llumbar lower  tender, no swelling or redness Lymphadenopathy:  Has no cervical adenopathy.  Neurological: Pt is alert and oriented to person, place, and time. Pt has normal reflexes. No cranial nerve deficit. Motor grossly intact Skin: Skin is warm and dry. No rash noted.  Psychiatric:  Has normal mood and affect. Behavior is normal.     Assessment & Plan:

## 2014-10-31 ENCOUNTER — Telehealth: Payer: Self-pay | Admitting: *Deleted

## 2014-10-31 DIAGNOSIS — I10 Essential (primary) hypertension: Secondary | ICD-10-CM

## 2014-10-31 MED ORDER — LOSARTAN POTASSIUM 50 MG PO TABS
50.0000 mg | ORAL_TABLET | Freq: Every day | ORAL | Status: DC
Start: 2014-10-31 — End: 2015-07-09

## 2014-10-31 NOTE — Telephone Encounter (Signed)
Pt states she is needing new rxc for her Los[facility] to [facility]. Inform pt sending electronically...Raechel Chute

## 2014-11-06 ENCOUNTER — Other Ambulatory Visit: Payer: Self-pay

## 2014-11-06 DIAGNOSIS — Z1231 Encounter for screening mammogram for malignant neoplasm of breast: Secondary | ICD-10-CM

## 2014-12-07 ENCOUNTER — Ambulatory Visit (INDEPENDENT_AMBULATORY_CARE_PROVIDER_SITE_OTHER): Payer: PPO | Admitting: Internal Medicine

## 2014-12-07 ENCOUNTER — Encounter: Payer: Self-pay | Admitting: Internal Medicine

## 2014-12-07 VITALS — BP 108/60 | HR 59 | Temp 98.0°F | Ht 62.0 in | Wt 122.0 lb

## 2014-12-07 DIAGNOSIS — Z0000 Encounter for general adult medical examination without abnormal findings: Secondary | ICD-10-CM

## 2014-12-07 DIAGNOSIS — Z23 Encounter for immunization: Secondary | ICD-10-CM | POA: Diagnosis not present

## 2014-12-07 DIAGNOSIS — I10 Essential (primary) hypertension: Secondary | ICD-10-CM

## 2014-12-07 DIAGNOSIS — F411 Generalized anxiety disorder: Secondary | ICD-10-CM | POA: Diagnosis not present

## 2014-12-07 DIAGNOSIS — E785 Hyperlipidemia, unspecified: Secondary | ICD-10-CM | POA: Diagnosis not present

## 2014-12-07 NOTE — Assessment & Plan Note (Signed)
stable overall by history and exam, recent data reviewed with pt, and pt to continue medical treatment as before,  to f/u any worsening symptoms or concerns Lab Results  Component Value Date   LDLCALC 62 08/08/2014

## 2014-12-07 NOTE — Progress Notes (Signed)
Subjective:    Patient ID: Ann Booth, female    DOB: [birth year], 81 y.o.   MRN: [id number]  HPI  Here to f/u, last mammogram 2012, put it off due to her husbands illness, was rec'd for 3d tomo mammogram which she is just not sure of. Pt continues to have recurring LBP without change in severity, bowel or bladder change, fever, wt loss,  worsening LE pain/numbness/weakness, gait change or falls, has been helped going back and forth with tramadol, also saw chiropracter with an adjustment that left her with mild left chest pleuritic pain.  No cough, fever, sob and Pt denies other chest pain, increased sob or doe, wheezing, orthopnea, PND, increased LE swelling, palpitations, dizziness or syncope.   Realizes she has been tx with gabapentin, but has 2 dogs with arthritis, and may have given it to her dogs with other meds, shes not sure.  Past Medical History  Diagnosis Date  . ALLERGIC RHINITIS   . ANXIETY   . ASTHMA   . BACK PAIN   . CATARACT NOS   . DEPRESSION   . GLAUCOMA NOS   . Hypercalcemia   . MITRAL VALVE PROLAPSE   . OSTEOPOROSIS   . PERIPHERAL NEUROPATHY   . POSITIONAL VERTIGO   . Rosacea   . HTN (hypertension)   . Carotid stenosis 04/27/2012    Mild left on community screening  - Nov 2013  . Atrial fibrillation [facility])    Past Surgical History  Procedure Laterality Date  . Cataract extraction, bilateral    . Posterior chamber interocular lens implant    . Hemorrhoid surgery    .  ankle surgury      left ankle    reports that she has never smoked. She has never used smokeless tobacco. She reports that she does not drink alcohol or use illicit drugs. family history includes CAD in her mother; Cancer in her brother and sister. No Known Allergies Current Outpatient Prescriptions on File Prior to Visit  Medication Sig Dispense Refill  . ALPRAZolam (XANAX) 0.5 MG tablet 1/2 - 1 tab by mouth at bedtime as needed 30 tablet 5  . b complex vitamins tablet Take 1 tablet by mouth  daily.    . Biotin 1 MG CAPS Take 1 tablet by mouth daily.    . Calcium Carbonate-Vitamin D (CALCIUM + D PO) Take 1 tablet by mouth daily.     . celecoxib (CELEBREX) 100 MG capsule Take 1 capsule (100 mg total) by mouth 2 (two) times daily as needed. 60 capsule 5  . Coenzyme Q10 (COQ-10) 100 MG CAPS Take by mouth daily.    Marland Kitchen glucosamine-chondroitin 500-400 MG tablet Take 2 tablets by mouth daily.    . IRON, FERROUS GLUCONATE, PO Take 1 tablet by mouth every other day.    . losartan (COZAAR) 50 MG tablet Take 1 tablet (50 mg total) by mouth daily. 90 tablet 3  . LUTEIN PO Take 1 tablet by mouth daily.    . Magnesium Citrate 100 MG TABS Take by mouth daily.    . Multiple Vitamins-Minerals (MACULAR VITAMIN BENEFIT PO) Take 2 tablets by mouth daily.    . Multiple Vitamins-Minerals (MULTIVITAMIN & MINERAL PO) Take 1 tablet by mouth daily.    . Rivaroxaban (XARELTO) 15 MG TABS tablet Take 1 tablet (15 mg total) by mouth daily with supper. 90 tablet 3  . Thiamine HCl (B-1 PO) Take by mouth daily.    . timolol (BETIMOL) 0.5 %  ophthalmic solution Place 1 drop into both eyes daily.    . traMADol (ULTRAM) 50 MG tablet Take 1 tablet (50 mg total) by mouth every 8 (eight) hours as needed. 90 tablet 2  . vitamin B-12 (CYANOCOBALAMIN) 1000 MCG tablet Take 1,000 mcg by mouth every other day.      No current facility-administered medications on file prior to visit.   Review of Systems  Constitutional: Negative for unusual diaphoresis or night sweats HENT: Negative for ringing in ear or discharge Eyes: Negative for double vision or worsening visual disturbance.  Respiratory: Negative for choking and stridor.   Gastrointestinal: Negative for vomiting or other signifcant bowel change Genitourinary: Negative for hematuria or change in urine volume.  Musculoskeletal: Negative for other MSK pain or swelling Skin: Negative for color change and worsening wound.  Neurological: Negative for tremors and numbness  other than noted  Psychiatric/Behavioral: Negative for decreased concentration or agitation other than above  ;    Objective:   Physical Exam BP 108/60 mmHg  Pulse 59  Temp(Src) 98 F (36.7 C) (Oral)  Ht  (1.575 m)  Wt 122 lb (55.339 kg)  BMI 22.31 kg/m2  SpO2 98% VS noted,  Constitutional: Pt appears in no significant distress HENT: Head: NCAT.  Right Ear: External ear normal.  Left Ear: External ear normal.  Eyes: . Pupils are equal, round, and reactive to light. Conjunctivae and EOM are normal Neck: Normal range of motion. Neck supple.  Cardiovascular: Normal rate and regular rhythm.   Pulmonary/Chest: Effort normal and breath sounds without rales or wheezing.  Abd:  Soft, NT, ND, + BS Neurological: Pt is alert. Not confused , motor grossly intact Skin: Skin is warm. No rash, no LE edema Psychiatric: Pt behavior is normal. No agitation. mild nervous     Assessment & Plan:

## 2014-12-07 NOTE — Progress Notes (Signed)
Pre visit review using our clinic review tool, if applicable. No additional management support is needed unless otherwise documented below in the visit note. 

## 2014-12-07 NOTE — Assessment & Plan Note (Signed)
stable overall by history and exam, recent data reviewed with pt, and pt to continue medical treatment as before,  to f/u any worsening symptoms or concerns Lab Results  Component Value Date   WBC 4.0 08/08/2014   HGB 13.0 08/08/2014   HCT 38.7 08/08/2014   PLT 185.0 08/08/2014   GLUCOSE 89 08/08/2014   CHOL 167 08/08/2014   TRIG 101.0 08/08/2014   HDL 85.10 08/08/2014   LDLCALC 62 08/08/2014   ALT 18 08/08/2014   AST 31 08/08/2014   NA 138 08/08/2014   K 4.1 08/08/2014   CL 103 08/08/2014   CREATININE 0.70 08/08/2014   BUN 19 08/08/2014   CO2 31 08/08/2014   TSH 0.96 08/08/2014   INR 1.09 01/24/2013

## 2014-12-07 NOTE — Assessment & Plan Note (Signed)
Not charged, did dw pt the need for 3d mammogram is reasonable, and will help order this for her

## 2014-12-07 NOTE — Assessment & Plan Note (Signed)
stable overall by history and exam, recent data reviewed with pt, and pt to continue medical treatment as before,  to f/u any worsening symptoms or concerns BP Readings from Last 3 Encounters:  12/07/14 108/60  08/08/14 148/80  07/14/14 164/72

## 2014-12-07 NOTE — Patient Instructions (Addendum)
Please continue all other medications as before, and refills have been done if requested.  Please have the pharmacy call with any other refills you may need.  Please continue your efforts at being more active, low cholesterol diet, and weight control.  You are otherwise up to date with prevention measures today.  Please keep your appointments with your specialists as you may have planned  You will be contacted regarding the referral for: mammogram with 3D tomography  Please return in 3 months, or sooner if needed

## 2014-12-11 ENCOUNTER — Other Ambulatory Visit: Payer: Self-pay | Admitting: Internal Medicine

## 2014-12-12 NOTE — Telephone Encounter (Signed)
Done hardcopy to Dahlia  

## 2014-12-12 NOTE — Telephone Encounter (Signed)
Rx faxed to pharmacy  

## 2015-01-01 NOTE — Progress Notes (Signed)
HPI: FU atrial fibrillation. Monitor in August of 2014 showed PAF. Toprol DCed previously due to bradycardia. Echocardiogram in November 2015 showed normal LV function, grade 1 diastolic dysfunction, mild mitral regurgitation with systolic prolapse of anterior mitral valve leaflet. Since I last saw her, the patient denies any dyspnea on exertion, orthopnea, PND, pedal edema, palpitations, syncope or chest pain.    Current Outpatient Prescriptions  Medication Sig Dispense Refill  . ALPRAZolam (XANAX) 0.5 MG tablet 1/2 - 1 tab by mouth at bedtime as needed 30 tablet 5  . b complex vitamins tablet Take 1 tablet by mouth daily.    . Biotin 1 MG CAPS Take 1 tablet by mouth daily.    . Calcium Carbonate-Vitamin D (CALCIUM + D PO) Take 1 tablet by mouth daily.     . celecoxib (CELEBREX) 100 MG capsule Take 1 capsule (100 mg total) by mouth 2 (two) times daily as needed. 60 capsule 5  . Coenzyme Q10 (COQ-10) 100 MG CAPS Take by mouth daily.    Marland Kitchen glucosamine-chondroitin 500-400 MG tablet Take 2 tablets by mouth daily.    . IRON, FERROUS GLUCONATE, PO Take 1 tablet by mouth every other day.    . losartan (COZAAR) 50 MG tablet Take 1 tablet (50 mg total) by mouth daily. 90 tablet 3  . LUTEIN PO Take 1 tablet by mouth daily.    . Magnesium Citrate 100 MG TABS Take by mouth daily.    . Multiple Vitamins-Minerals (MACULAR VITAMIN BENEFIT PO) Take 2 tablets by mouth daily.    . Multiple Vitamins-Minerals (MULTIVITAMIN & MINERAL PO) Take 1 tablet by mouth daily.    . Rivaroxaban (XARELTO) 15 MG TABS tablet Take 1 tablet (15 mg total) by mouth daily with supper. 90 tablet 3  . Thiamine HCl (B-1 PO) Take by mouth daily.    . timolol (BETIMOL) 0.5 % ophthalmic solution Place 1 drop into both eyes daily.    . traMADol (ULTRAM) 50 MG tablet TAKE ONE TABLET BY MOUTH EVERY 8 HOURS AS NEEDED 90 tablet 2  . vitamin B-12 (CYANOCOBALAMIN) 1000 MCG tablet Take 1,000 mcg by mouth every other day.      No  current facility-administered medications for this visit.     Past Medical History  Diagnosis Date  . ALLERGIC RHINITIS   . ANXIETY   . ASTHMA   . BACK PAIN   . CATARACT NOS   . DEPRESSION   . GLAUCOMA NOS   . Hypercalcemia   . MITRAL VALVE PROLAPSE   . OSTEOPOROSIS   . PERIPHERAL NEUROPATHY   . POSITIONAL VERTIGO   . Rosacea   . HTN (hypertension)   . Carotid stenosis 04/27/2012    Mild left on community screening  - Nov 2013  . Atrial fibrillation [facility])     Past Surgical History  Procedure Laterality Date  . Cataract extraction, bilateral    . Posterior chamber interocular lens implant    . Hemorrhoid surgery    .  ankle surgury      left ankle    Social History   Social History  . Marital Status: Widowed    Spouse Name: N/A  . Number of Children: N/A  . Years of Education: N/A   Occupational History  . retired [employer]    Social History Main Topics  . Smoking status: Never Smoker   . Smokeless tobacco: Never Used  . Alcohol Use: No  . Drug Use: No  .  Sexual Activity: Not on file   Other Topics Concern  . Not on file   Social History Narrative    ROS: no fevers or chills, productive cough, hemoptysis, dysphasia, odynophagia, melena, hematochezia, dysuria, hematuria, rash, seizure activity, orthopnea, PND, pedal edema, claudication. Remaining systems are negative.  Physical Exam: Well-developed well-nourished in no acute distress.  Skin is warm and dry.  HEENT is normal.  Neck is supple.  Chest is clear to auscultation with normal expansion.  Cardiovascular exam is regular rate and rhythm. 2/6 systolic murmur apex Abdominal exam nontender or distended. No masses palpated. Extremities show no edema. neuro grossly intact  ECG Sinus bradycardia with occasional PAC. First-degree AV block. No ST changes. Cannot rule out prior septal infarct.

## 2015-01-08 ENCOUNTER — Ambulatory Visit (INDEPENDENT_AMBULATORY_CARE_PROVIDER_SITE_OTHER): Payer: PPO | Admitting: Cardiology

## 2015-01-08 ENCOUNTER — Encounter: Payer: Self-pay | Admitting: Cardiology

## 2015-01-08 VITALS — BP 152/70 | HR 51 | Ht 62.0 in | Wt 122.0 lb

## 2015-01-08 DIAGNOSIS — I4891 Unspecified atrial fibrillation: Secondary | ICD-10-CM | POA: Diagnosis not present

## 2015-01-08 DIAGNOSIS — I10 Essential (primary) hypertension: Secondary | ICD-10-CM

## 2015-01-08 DIAGNOSIS — I059 Rheumatic mitral valve disease, unspecified: Secondary | ICD-10-CM

## 2015-01-08 LAB — BASIC METABOLIC PANEL
BUN: 25 mg/dL (ref 7–25)
CALCIUM: 10.5 mg/dL — AB (ref 8.6–10.4)
CO2: 30 mmol/L (ref 20–31)
Chloride: 102 mmol/L (ref 98–110)
Creat: 0.75 mg/dL (ref 0.60–0.88)
Glucose, Bld: 94 mg/dL (ref 65–99)
POTASSIUM: 4.6 mmol/L (ref 3.5–5.3)
Sodium: 140 mmol/L (ref 135–146)

## 2015-01-08 NOTE — Assessment & Plan Note (Signed)
Mitral regurgitation is mild on most recent echo.

## 2015-01-08 NOTE — Assessment & Plan Note (Signed)
Management per primary care. 

## 2015-01-08 NOTE — Assessment & Plan Note (Signed)
Patient remains in sinus rhythm.Continue xarelto; check Hgb and renal function.

## 2015-01-08 NOTE — Patient Instructions (Signed)

## 2015-01-08 NOTE — Assessment & Plan Note (Signed)
Blood pressure mildly elevated. I have asked her to track her blood pressure at home and we will add additional medications as needed.

## 2015-01-09 ENCOUNTER — Encounter: Payer: Self-pay | Admitting: Cardiology

## 2015-01-17 NOTE — Addendum Note (Signed)
Addended by: Ronnell Guadalajara A on: 01/17/2015 08:23 AM   Modules accepted: Orders

## 2015-01-24 ENCOUNTER — Encounter: Payer: Self-pay | Admitting: Cardiology

## 2015-01-24 NOTE — Telephone Encounter (Signed)
Pt called in stating that [name] called her yesterday but she missed the call. She believes that [name] is calling her back in regards to some blood test results from 11/14. Please f/u with her  Thanks

## 2015-01-24 NOTE — Telephone Encounter (Signed)
This encounter was created in error - please disregard.

## 2015-02-06 ENCOUNTER — Ambulatory Visit: Payer: PPO | Admitting: Internal Medicine

## 2015-02-12 ENCOUNTER — Ambulatory Visit
Admission: RE | Admit: 2015-02-12 | Discharge: 2015-02-12 | Disposition: A | Discharge status: Home / Self Care | Payer: PPO | Source: Ambulatory Visit

## 2015-02-12 DIAGNOSIS — Z1231 Encounter for screening mammogram for malignant neoplasm of breast: Secondary | ICD-10-CM

## 2015-03-08 ENCOUNTER — Encounter: Payer: Self-pay | Admitting: Internal Medicine

## 2015-03-08 ENCOUNTER — Other Ambulatory Visit (INDEPENDENT_AMBULATORY_CARE_PROVIDER_SITE_OTHER): Payer: PPO

## 2015-03-08 ENCOUNTER — Ambulatory Visit (INDEPENDENT_AMBULATORY_CARE_PROVIDER_SITE_OTHER): Payer: PPO | Admitting: Internal Medicine

## 2015-03-08 VITALS — BP 158/76 | HR 56 | Temp 97.8°F | Ht 62.0 in | Wt 121.0 lb

## 2015-03-08 DIAGNOSIS — F411 Generalized anxiety disorder: Secondary | ICD-10-CM

## 2015-03-08 DIAGNOSIS — M810 Age-related osteoporosis without current pathological fracture: Secondary | ICD-10-CM | POA: Diagnosis not present

## 2015-03-08 DIAGNOSIS — I10 Essential (primary) hypertension: Secondary | ICD-10-CM | POA: Diagnosis not present

## 2015-03-08 DIAGNOSIS — M792 Neuralgia and neuritis, unspecified: Secondary | ICD-10-CM

## 2015-03-08 DIAGNOSIS — Z0000 Encounter for general adult medical examination without abnormal findings: Secondary | ICD-10-CM | POA: Diagnosis not present

## 2015-03-08 DIAGNOSIS — E785 Hyperlipidemia, unspecified: Secondary | ICD-10-CM

## 2015-03-08 DIAGNOSIS — G629 Polyneuropathy, unspecified: Secondary | ICD-10-CM | POA: Diagnosis not present

## 2015-03-08 LAB — HEPATIC FUNCTION PANEL
ALBUMIN: 4.3 g/dL (ref 3.5–5.2)
ALK PHOS: 69 U/L (ref 39–117)
ALT: 19 U/L (ref 0–35)
AST: 32 U/L (ref 0–37)
BILIRUBIN TOTAL: 0.5 mg/dL (ref 0.2–1.2)
Bilirubin, Direct: 0.1 mg/dL (ref 0.0–0.3)
Total Protein: 7.2 g/dL (ref 6.0–8.3)

## 2015-03-08 LAB — URINALYSIS, ROUTINE W REFLEX MICROSCOPIC
BILIRUBIN URINE: NEGATIVE
HGB URINE DIPSTICK: NEGATIVE
Ketones, ur: NEGATIVE
Leukocytes, UA: NEGATIVE
NITRITE: NEGATIVE
Specific Gravity, Urine: 1.015 (ref 1.000–1.030)
Total Protein, Urine: NEGATIVE
URINE GLUCOSE: NEGATIVE
Urobilinogen, UA: 0.2 (ref 0.0–1.0)
pH: 7 (ref 5.0–8.0)

## 2015-03-08 LAB — LIPID PANEL
CHOLESTEROL: 180 mg/dL (ref 0–200)
HDL: 85.1 mg/dL (ref >39.00)
LDL Cholesterol: 74 mg/dL (ref 0–99)
NONHDL: 95.21
Total CHOL/HDL Ratio: 2
Triglycerides: 104 mg/dL (ref 0.0–149.0)
VLDL: 20.8 mg/dL (ref 0.0–40.0)

## 2015-03-08 LAB — CBC WITH DIFFERENTIAL/PLATELET
BASOS ABS: 0 10*3/uL (ref 0.0–0.1)
Basophils Relative: 0.9 % (ref 0.0–3.0)
EOS ABS: 0.1 10*3/uL (ref 0.0–0.7)
Eosinophils Relative: 3.4 % (ref 0.0–5.0)
HCT: 40.1 % (ref 36.0–46.0)
Hemoglobin: 13.4 g/dL (ref 12.0–15.0)
LYMPHS ABS: 1.1 10*3/uL (ref 0.7–4.0)
Lymphocytes Relative: 31.9 % (ref 12.0–46.0)
MCHC: 33.4 g/dL (ref 30.0–36.0)
MCV: 96.1 fl (ref 78.0–100.0)
Monocytes Absolute: 0.5 10*3/uL (ref 0.1–1.0)
Monocytes Relative: 13.7 % — ABNORMAL HIGH (ref 3.0–12.0)
NEUTROS ABS: 1.7 10*3/uL (ref 1.4–7.7)
NEUTROS PCT: 50.1 % (ref 43.0–77.0)
PLATELETS: 177 10*3/uL (ref 150.0–400.0)
RBC: 4.17 Mil/uL (ref 3.87–5.11)
RDW: 14 % (ref 11.5–15.5)
WBC: 3.5 10*3/uL — ABNORMAL LOW (ref 4.0–10.5)

## 2015-03-08 LAB — BASIC METABOLIC PANEL
BUN: 18 mg/dL (ref 6–23)
CALCIUM: 10.7 mg/dL — AB (ref 8.4–10.5)
CO2: 34 meq/L — AB (ref 19–32)
CREATININE: 0.69 mg/dL (ref 0.40–1.20)
Chloride: 105 mEq/L (ref 96–112)
GFR: 85.53 mL/min (ref >60.00)
GLUCOSE: 92 mg/dL (ref 70–99)
Potassium: 4.6 mEq/L (ref 3.5–5.1)
Sodium: 143 mEq/L (ref 135–145)

## 2015-03-08 LAB — TSH: TSH: 1.33 u[IU]/mL (ref 0.35–4.50)

## 2015-03-08 MED ORDER — CELECOXIB 100 MG PO CAPS: 100.0000 mg | ORAL_CAPSULE | Freq: Two times a day (BID) | ORAL | Status: DC | PRN

## 2015-03-08 MED ORDER — AMITRIPTYLINE HCL 50 MG PO TABS: 50.0000 mg | ORAL_TABLET | Freq: Every evening | ORAL | Status: DC | PRN

## 2015-03-08 NOTE — Patient Instructions (Signed)
Please take all new medication as prescribed - the elavil at bedtime for pain  Please continue all other medications as before, and refills have been done if requested.  Please have the pharmacy call with any other refills you may need.  Please continue your efforts at being more active, low cholesterol diet, and weight control.  You are otherwise up to date with prevention measures today.  Please keep your appointments with your specialists as you may have planned  Please go to the LAB in the Basement (turn left off the elevator) for the tests to be done today  You will be contacted by phone if any changes need to be made immediately.  Otherwise, you will receive a letter about your results with an explanation, but please check with MyChart first.  Please remember to sign up for MyChart if you have not done so, as this will be important to you in the future with finding out test results, communicating by private email, and scheduling acute appointments online when needed.  Please return in 6 months, or sooner if needed

## 2015-03-08 NOTE — Assessment & Plan Note (Signed)
Ok for elavil qhs prn 50 mg,  to f/u any worsening symptoms or concerns

## 2015-03-08 NOTE — Assessment & Plan Note (Signed)
stable overall by history and exam, recent data reviewed with pt, and pt to continue medical treatment as before,  to f/u any worsening symptoms or concerns Lab Results  Component Value Date   WBC 4.0 08/08/2014   HGB 13.0 08/08/2014   HCT 38.7 08/08/2014   PLT 185.0 08/08/2014   GLUCOSE 94 01/08/2015   CHOL 167 08/08/2014   TRIG 101.0 08/08/2014   HDL 85.10 08/08/2014   LDLCALC 62 08/08/2014   ALT 18 08/08/2014   AST 31 08/08/2014   NA 140 01/08/2015   K 4.6 01/08/2015   CL 102 01/08/2015   CREATININE 0.75 01/08/2015   BUN 25 01/08/2015   CO2 30 01/08/2015   TSH 0.96 08/08/2014   INR 1.09 01/24/2013

## 2015-03-08 NOTE — Progress Notes (Signed)
Pre visit review using our clinic review tool, if applicable. No additional management support is needed unless otherwise documented below in the visit note. 

## 2015-03-08 NOTE — Assessment & Plan Note (Signed)
Mild elev, o/w stable overall by history and exam, recent data reviewed with pt, and pt to continue medical treatment as before - delcines med change today,  to f/u any worsening symptoms or concerns BP Readings from Last 3 Encounters:  03/08/15 158/76  01/08/15 152/70  12/07/14 108/60

## 2015-03-08 NOTE — Assessment & Plan Note (Signed)
Due for dxa but not sure if she wants prolia, delcines dxa for now

## 2015-03-08 NOTE — Progress Notes (Signed)
Subjective:    Patient ID: Ann Booth, female    DOB: February 09, 1927, 82 y.o.   MRN: [id number]  HPI  Here for wellness and f/u;  Overall doing ok;  Pt denies Chest pain, worsening SOB, DOE, wheezing, orthopnea, PND, worsening LE edema, palpitations, dizziness or syncope.  Pt denies neurological change such as new headache, facial or extremity weakness.  Pt denies polydipsia, polyuria, or low sugar symptoms. Pt states overall good compliance with treatment and medications, good tolerability, and has been trying to follow appropriate diet.  Pt denies worsening depressive symptoms, suicidal ideation or panic. No fever, night sweats, wt loss, loss of appetite, or other constitutional symptoms.  Pt states good ability with ADL's, has low fall risk, home safety reviewed and adequate, no other significant changes in hearing or vision, and only occasionally active with exercise.Not taking the celebrex recently since concerned bout the xarleto, Pt continues to have recurring LBP without change in severity, bowel or bladder change, fever, wt loss,  worsening LE pain/numbness/weakness, gait change or falls, wanting to restart.  Trmaadol causes some vision difficulty. Has some incresaed neuropathic pain at night. Due for DXA, but declines for now, does not want to go back on fosamax, Not sure about prolia Past Medical History  Diagnosis Date  . ALLERGIC RHINITIS   . ANXIETY   . ASTHMA   . BACK PAIN   . CATARACT NOS   . DEPRESSION   . GLAUCOMA NOS   . Hypercalcemia   . MITRAL VALVE PROLAPSE   . OSTEOPOROSIS   . PERIPHERAL NEUROPATHY   . POSITIONAL VERTIGO   . Rosacea   . HTN (hypertension)   . Carotid stenosis 04/27/2012    Mild left on community screening  - Nov 2013  . Atrial fibrillation [facility])    Past Surgical History  Procedure Laterality Date  . Cataract extraction, bilateral    . Posterior chamber interocular lens implant    . Hemorrhoid surgery    .  ankle surgury      left ankle    reports that she has never smoked. She has never used smokeless tobacco. She reports that she does not drink alcohol or use illicit drugs. family history includes CAD in her mother; Cancer in her brother and sister. No Known Allergies Current Outpatient Prescriptions on File Prior to Visit  Medication Sig Dispense Refill  . ALPRAZolam (XANAX) 0.5 MG tablet 1/2 - 1 tab by mouth at bedtime as needed 30 tablet 5  . b complex vitamins tablet Take 1 tablet by mouth daily.    . Biotin 1 MG CAPS Take 1 tablet by mouth daily.    . Calcium Carbonate-Vitamin D (CALCIUM + D PO) Take 1 tablet by mouth daily.     . Coenzyme Q10 (COQ-10) 100 MG CAPS Take by mouth daily.    Marland Kitchen glucosamine-chondroitin 500-400 MG tablet Take 2 tablets by mouth daily.    . IRON, FERROUS GLUCONATE, PO Take 1 tablet by mouth every other day.    . losartan (COZAAR) 50 MG tablet Take 1 tablet (50 mg total) by mouth daily. 90 tablet 3  . LUTEIN PO Take 1 tablet by mouth daily.    . Magnesium Citrate 100 MG TABS Take by mouth daily.    . Multiple Vitamins-Minerals (MACULAR VITAMIN BENEFIT PO) Take 2 tablets by mouth daily.    . Multiple Vitamins-Minerals (MULTIVITAMIN & MINERAL PO) Take 1 tablet by mouth daily.    . Rivaroxaban (XARELTO) 15 MG  TABS tablet Take 1 tablet (15 mg total) by mouth daily with supper. 90 tablet 3  . Thiamine HCl (B-1 PO) Take by mouth daily.    . timolol (BETIMOL) 0.5 % ophthalmic solution Place 1 drop into both eyes daily.    . vitamin B-12 (CYANOCOBALAMIN) 1000 MCG tablet Take 1,000 mcg by mouth every other day.      No current facility-administered medications on file prior to visit.   Review of Systems Constitutional: Negative for increased diaphoresis, other activity, appetite or siginficant weight change other than noted HENT: Negative for worsening hearing loss, ear pain, facial swelling, mouth sores and neck stiffness.   Eyes: Negative for other worsening pain, redness or visual disturbance.    Respiratory: Negative for shortness of breath and wheezing  Cardiovascular: Negative for chest pain and palpitations.  Gastrointestinal: Negative for diarrhea, blood in stool, abdominal distention or other pain Genitourinary: Negative for hematuria, flank pain or change in urine volume.  Musculoskeletal: Negative for myalgias or other joint complaints.  Skin: Negative for color change and wound or drainage.  Neurological: Negative for syncope and numbness. other than noted Hematological: Negative for adenopathy. or other swelling Psychiatric/Behavioral: Negative for hallucinations, SI, self-injury, decreased concentration or other worsening agitation.      Objective:   Physical Exam BP 158/76 mmHg  Pulse 56  Temp(Src) 97.8 F (36.6 C) (Oral)  Ht  (1.575 m)  Wt 121 lb (54.885 kg)  BMI 22.13 kg/m2  SpO2 98% VS noted,  Constitutional: Pt is oriented to person, place, and time. Appears well-developed and well-nourished, in no significant distress Head: Normocephalic and atraumatic.  Right Ear: External ear normal.  Left Ear: External ear normal.  Nose: Nose normal.  Mouth/Throat: Oropharynx is clear and moist.  Eyes: Conjunctivae and EOM are normal. Pupils are equal, round, and reactive to light.  Neck: Normal range of motion. Neck supple. No JVD present. No tracheal deviation present or significant neck LA or mass Cardiovascular: Normal rate, regular rhythm, normal heart sounds and intact distal pulses.   Pulmonary/Chest: Effort normal and breath sounds without rales or wheezing  Abdominal: Soft. Bowel sounds are normal. NT. No HSM  Musculoskeletal: Normal range of motion. Exhibits no edema.  Lymphadenopathy:  Has no cervical adenopathy.  Neurological: Pt is alert and oriented to person, place, and time. Pt has normal reflexes. No cranial nerve deficit. Motor grossly intact Skin: Skin is warm and dry. No rash noted.  Psychiatric:  Has normal mood and affect. Behavior is  normal.     Assessment & Plan:

## 2015-03-09 ENCOUNTER — Other Ambulatory Visit: Payer: Self-pay | Admitting: Internal Medicine

## 2015-03-09 ENCOUNTER — Encounter: Payer: Self-pay | Admitting: Internal Medicine

## 2015-03-09 ENCOUNTER — Telehealth: Payer: Self-pay | Admitting: Internal Medicine

## 2015-03-09 NOTE — Telephone Encounter (Signed)
Please return patient call 

## 2015-03-09 NOTE — Telephone Encounter (Signed)
See result note.  

## 2015-03-10 NOTE — Assessment & Plan Note (Signed)
stable overall by history and exam, recent data reviewed with pt, and pt to continue medical treatment as before,  to f/u any worsening symptoms or concerns Lab Results  Component Value Date   LDLCALC 74 03/08/2015

## 2015-03-10 NOTE — Assessment & Plan Note (Signed)

## 2015-03-12 ENCOUNTER — Other Ambulatory Visit (INDEPENDENT_AMBULATORY_CARE_PROVIDER_SITE_OTHER): Payer: PPO

## 2015-03-12 ENCOUNTER — Other Ambulatory Visit: Payer: PPO

## 2015-03-12 LAB — VITAMIN D 25 HYDROXY (VIT D DEFICIENCY, FRACTURES): VITD: 98.15 ng/mL (ref 30.00–100.00)

## 2015-03-13 LAB — PTH, INTACT AND CALCIUM
CALCIUM: 10.1 mg/dL (ref 8.4–10.5)
PTH: 26 pg/mL (ref 14–64)

## 2015-03-14 ENCOUNTER — Encounter: Payer: Self-pay | Admitting: Internal Medicine

## 2015-03-22 ENCOUNTER — Ambulatory Visit (INDEPENDENT_AMBULATORY_CARE_PROVIDER_SITE_OTHER): Payer: PPO | Admitting: Podiatry

## 2015-03-22 ENCOUNTER — Ambulatory Visit (INDEPENDENT_AMBULATORY_CARE_PROVIDER_SITE_OTHER): Payer: PPO

## 2015-03-22 ENCOUNTER — Encounter: Payer: Self-pay | Admitting: Podiatry

## 2015-03-22 VITALS — BP 153/76 | HR 55 | Resp 16

## 2015-03-22 DIAGNOSIS — M7751 Other enthesopathy of right foot: Secondary | ICD-10-CM

## 2015-03-22 DIAGNOSIS — M79671 Pain in right foot: Secondary | ICD-10-CM

## 2015-03-22 DIAGNOSIS — M779 Enthesopathy, unspecified: Secondary | ICD-10-CM | POA: Diagnosis not present

## 2015-03-22 DIAGNOSIS — M71571 Other bursitis, not elsewhere classified, right ankle and foot: Secondary | ICD-10-CM

## 2015-03-22 NOTE — Progress Notes (Signed)
   Subjective:    Patient ID: Ann Booth, female    DOB: Sep 26, 1926, 82 y.o.   MRN: [id number]  HPI: 82 year old female presents today with chief complaint of painful fifth metatarsal of the right foot. She states that certain shoe gear really aggravates this and she is not sure whether or not she has developed an ulcer or not.    Review of Systems  Musculoskeletal: Positive for arthralgias.  Neurological: Positive for dizziness.  Hematological: Bruises/bleeds easily.  All other systems reviewed and are negative.      Objective:   Physical Exam: Vital signs are stable she is alert and oriented 3. No acute distress. Pulses are strongly palpable right foot. Neurologic sensorium is intact. Deep tendon reflexes are intact. Muscle strength is normal bilateral. Orthopedic evaluation demonstrates all just distal to the ankle for range of motion without crepitation. She does have a large palpable fifth metatarsal base of the right foot with overlying erythema and a what appears to be a simple superficial ulceration that has healed up. He does have some boggy overlying tissue which appears to be a bursitis          Assessment & Plan:  Bursitis fifth metatarsal base right foot.  Plan: Injected the area today with 2 mg of dexamethasone. Also placed padding and demonstrated to her how to place this padding on her foot to help offload pressure from that area. We discussed purchasing these pans either from [place] or the pharmacy and she will notify us as needed.

## 2015-04-02 ENCOUNTER — Telehealth: Payer: Self-pay | Admitting: *Deleted

## 2015-04-02 NOTE — Telephone Encounter (Signed)
About three months

## 2015-04-02 NOTE — Telephone Encounter (Addendum)
Pt states she received a cortisone injection a few weeks ago and wanted to know when she could get another.  Informed pt of Dr. Geryl Rankins recommendation, and instructed her on ice therapy, for about 10 minutes to the area a few times a day and call if needs an appt.

## 2015-04-27 DIAGNOSIS — H401132 Primary open-angle glaucoma, bilateral, moderate stage: Secondary | ICD-10-CM | POA: Diagnosis not present

## 2015-05-09 ENCOUNTER — Encounter: Payer: Self-pay | Admitting: Internal Medicine

## 2015-05-15 DIAGNOSIS — L821 Other seborrheic keratosis: Secondary | ICD-10-CM | POA: Diagnosis not present

## 2015-05-15 DIAGNOSIS — B078 Other viral warts: Secondary | ICD-10-CM | POA: Diagnosis not present

## 2015-05-15 DIAGNOSIS — L84 Corns and callosities: Secondary | ICD-10-CM | POA: Diagnosis not present

## 2015-05-28 DIAGNOSIS — H401132 Primary open-angle glaucoma, bilateral, moderate stage: Secondary | ICD-10-CM | POA: Diagnosis not present

## 2015-06-16 ENCOUNTER — Ambulatory Visit (INDEPENDENT_AMBULATORY_CARE_PROVIDER_SITE_OTHER): Payer: PPO | Admitting: Family Medicine

## 2015-06-16 ENCOUNTER — Encounter: Payer: Self-pay | Admitting: Family Medicine

## 2015-06-16 VITALS — BP 130/84 | HR 82 | Temp 97.7°F | Ht 62.0 in | Wt 129.0 lb

## 2015-06-16 DIAGNOSIS — J309 Allergic rhinitis, unspecified: Secondary | ICD-10-CM

## 2015-06-16 MED ORDER — BENZONATATE 200 MG PO CAPS: 200.0000 mg | ORAL_CAPSULE | Freq: Two times a day (BID) | ORAL | Status: DC | PRN

## 2015-06-16 NOTE — Progress Notes (Signed)
   Subjective:    Patient ID: Ann Booth, female    DOB: 1926/11/01, 82 y.o.   MRN: [id number]  Sinusitis This is a new problem. The current episode started 1 to 4 weeks ago. The problem has been gradually worsening since onset. There has been no fever. The pain is mild. Associated symptoms include congestion, coughing, sinus pressure, sneezing and a sore throat. Pertinent negatives include no ear pain, shortness of breath or swollen glands. (Deep nonproductive cough.. Keeping her up at night  right facial pain, right eye clear discharge  left ear pressure after blowing nose few days ago) Treatments tried: Cephelxin for foot infeciton, completed 1 month agodid take allegra and nasonex x 1 day. The treatment provided no relief.    Social History /Family History/Past Medical History reviewed and updated if needed.   Nonsmoker Review of Systems  HENT: Positive for congestion, sinus pressure, sneezing and sore throat. Negative for ear pain.   Respiratory: Positive for cough. Negative for shortness of breath.        Objective:   Physical Exam  Constitutional: Vital signs are normal. She appears well-developed and well-nourished. She is cooperative.  Non-toxic appearance. She does not appear ill. No distress.  HENT:  Head: Normocephalic.  Right Ear: Hearing, tympanic membrane, external ear and ear canal normal. Tympanic membrane is not erythematous, not retracted and not bulging.  Left Ear: Hearing, tympanic membrane, external ear and ear canal normal. Tympanic membrane is not erythematous, not retracted and not bulging.  Nose: Mucosal edema and rhinorrhea present. Right sinus exhibits no maxillary sinus tenderness and no frontal sinus tenderness. Left sinus exhibits no maxillary sinus tenderness and no frontal sinus tenderness.  Mouth/Throat: Uvula is midline, oropharynx is clear and moist and mucous membranes are normal.  Eyes: Conjunctivae, EOM and lids are normal. Pupils are equal,  round, and reactive to light. Lids are everted and swept, no foreign bodies found.  Neck: Trachea normal and normal range of motion. Neck supple. Carotid bruit is not present. No thyroid mass and no thyromegaly present.  Cardiovascular: Normal rate, regular rhythm, S1 normal, S2 normal, normal heart sounds, intact distal pulses and normal pulses.  Exam reveals no gallop and no friction rub.   No murmur heard. Pulmonary/Chest: Effort normal and breath sounds normal. No tachypnea. No respiratory distress. She has no decreased breath sounds. She has no wheezes. She has no rhonchi. She has no rales.  Neurological: She is alert.  Skin: Skin is warm, dry and intact. No rash noted.  Psychiatric: Her speech is normal and behavior is normal. Judgment normal. Her mood appears not anxious. Cognition and memory are normal. She does not exhibit a depressed mood.          Assessment & Plan:

## 2015-06-16 NOTE — Assessment & Plan Note (Signed)
Start daily zyrtec and flonase . Benzonatate for cough.  No clear sign of bacterial infection.

## 2015-06-16 NOTE — Progress Notes (Signed)
Pre visit review using our clinic review tool, if applicable. No additional management support is needed unless otherwise documented below in the visit note. 

## 2015-06-16 NOTE — Patient Instructions (Addendum)
Start zyrtec at night.  Start flonase OTC 2 sprays per nostril daily.  Benzonatate  For cough as needed every 12 hours.  Call PCP if not improving as expected. Go to ER for severe shortness of breath.

## 2015-07-04 NOTE — Progress Notes (Signed)
HPI: FU atrial fibrillation. Monitor in August of 2014 showed PAF. Toprol DCed previously due to bradycardia. Echocardiogram in November 2015 showed normal LV function, grade 1 diastolic dysfunction, mild mitral regurgitation with systolic prolapse of anterior mitral valve leaflet. Since I last saw her, She denies dyspnea, chest pain, palpitations or syncope.  Current Outpatient Prescriptions  Medication Sig Dispense Refill  . ALPRAZolam (XANAX) 0.5 MG tablet 1/2 - 1 tab by mouth at bedtime as needed 30 tablet 5  . amitriptyline (ELAVIL) 50 MG tablet Take 1 tablet (50 mg total) by mouth at bedtime as needed for sleep. 90 tablet 1  . b complex vitamins tablet Take 1 tablet by mouth daily.    . benzonatate (TESSALON) 200 MG capsule Take 1 capsule (200 mg total) by mouth 2 (two) times daily as needed for cough. 20 capsule 0  . Biotin 1 MG CAPS Take 1 tablet by mouth daily.    . Calcium Carbonate-Vitamin D (CALCIUM + D PO) Take 1 tablet by mouth daily.     . celecoxib (CELEBREX) 100 MG capsule Take 1 capsule (100 mg total) by mouth 2 (two) times daily as needed. 60 capsule 5  . Coenzyme Q10 (COQ-10) 100 MG CAPS Take by mouth daily.    Marland Kitchen glucosamine-chondroitin 500-400 MG tablet Take 2 tablets by mouth daily.    . IRON, FERROUS GLUCONATE, PO Take 1 tablet by mouth every other day.    . losartan (COZAAR) 50 MG tablet Take 1 tablet (50 mg total) by mouth daily. 90 tablet 3  . LUTEIN PO Take 1 tablet by mouth daily.    . Magnesium Citrate 100 MG TABS Take by mouth daily.    . Multiple Vitamins-Minerals (MACULAR VITAMIN BENEFIT PO) Take 2 tablets by mouth daily.    . Multiple Vitamins-Minerals (MULTIVITAMIN & MINERAL PO) Take 1 tablet by mouth daily.    . Rivaroxaban (XARELTO) 15 MG TABS tablet Take 1 tablet (15 mg total) by mouth daily with supper. 90 tablet 3  . Thiamine HCl (B-1 PO) Take by mouth daily.    . timolol (BETIMOL) 0.5 % ophthalmic solution Place 1 drop into both eyes daily.    .  vitamin B-12 (CYANOCOBALAMIN) 1000 MCG tablet Take 1,000 mcg by mouth every other day.      No current facility-administered medications for this visit.     Past Medical History  Diagnosis Date  . ALLERGIC RHINITIS   . ANXIETY   . ASTHMA   . BACK PAIN   . CATARACT NOS   . DEPRESSION   . GLAUCOMA NOS   . Hypercalcemia   . MITRAL VALVE PROLAPSE   . OSTEOPOROSIS   . PERIPHERAL NEUROPATHY   . POSITIONAL VERTIGO   . Rosacea   . HTN (hypertension)   . Carotid stenosis 04/27/2012    Mild left on community screening  - Nov 2013  . Atrial fibrillation [facility])     Past Surgical History  Procedure Laterality Date  . Cataract extraction, bilateral    . Posterior chamber interocular lens implant    . Hemorrhoid surgery    .  ankle surgury      left ankle    Social History   Social History  . Marital Status: Widowed    Spouse Name: N/A  . Number of Children: N/A  . Years of Education: N/A   Occupational History  . retired [employer]    Social History Main Topics  . Smoking status:  Never Smoker   . Smokeless tobacco: Never Used  . Alcohol Use: No  . Drug Use: No  . Sexual Activity: Not on file   Other Topics Concern  . Not on file   Social History Narrative    Family History  Problem Relation Age of Onset  . Cancer Sister     colon  . Cancer Brother     prostate cancer  . CAD Mother     MI at age 76    ROS: Back pain but no fevers or chills, productive cough, hemoptysis, dysphasia, odynophagia, melena, hematochezia, dysuria, hematuria, rash, seizure activity, orthopnea, PND, pedal edema, claudication. Remaining systems are negative.  Physical Exam: Well-developed well-nourished in no acute distress.  Skin is warm and dry.  HEENT is normal.  Neck is supple.  Chest is clear to auscultation with normal expansion.  Cardiovascular exam is regular rate and rhythm.  Abdominal exam nontender or distended. No masses palpated. Extremities show no edema. neuro  grossly intact  ECG Sinus rhythm at a rate of 61.No ST changes.

## 2015-07-09 ENCOUNTER — Encounter: Payer: Self-pay | Admitting: Cardiology

## 2015-07-09 ENCOUNTER — Ambulatory Visit (INDEPENDENT_AMBULATORY_CARE_PROVIDER_SITE_OTHER): Payer: PPO | Admitting: Cardiology

## 2015-07-09 VITALS — BP 174/78 | HR 61 | Ht 62.0 in | Wt 125.0 lb

## 2015-07-09 DIAGNOSIS — I4891 Unspecified atrial fibrillation: Secondary | ICD-10-CM | POA: Diagnosis not present

## 2015-07-09 DIAGNOSIS — I10 Essential (primary) hypertension: Secondary | ICD-10-CM

## 2015-07-09 MED ORDER — LOSARTAN POTASSIUM 100 MG PO TABS
100.0000 mg | ORAL_TABLET | Freq: Every day | ORAL | Status: DC
Start: 2015-07-09 — End: 2016-07-10

## 2015-07-09 MED ORDER — RIVAROXABAN 15 MG PO TABS: 15.0000 mg | ORAL_TABLET | Freq: Every day | ORAL | Status: DC

## 2015-07-09 NOTE — Patient Instructions (Signed)
Medication Instructions:   INCREASE LOSARTAN TO 100 MG ONCE DAILY= 2 OF THE 50 MG TABLETS ONCE DAILY  Labwork:  Your physician recommends that you return for lab work in: ONE WEEK  Follow-Up:  Your physician wants you to follow-up in: 6 MONTHS WITH DR CRENSHAW You will receive a reminder letter in the mail two months in advance. If you don't receive a letter, please call our office to schedule the follow-up appointment.   If you need a refill on your cardiac medications before your next appointment, please call your pharmacy.    

## 2015-07-09 NOTE — Assessment & Plan Note (Signed)
Blood pressure is elevated. Increase Cozaar to 100 mg daily. Check potassium and renal function in 1 week.

## 2015-07-09 NOTE — Assessment & Plan Note (Signed)
MR is mild on most recent echocardiogram.

## 2015-07-09 NOTE — Assessment & Plan Note (Signed)
Patient remains in sinus rhythm.Continue xarelto. Check hemoglobin and renal function.

## 2015-07-16 DIAGNOSIS — I4891 Unspecified atrial fibrillation: Secondary | ICD-10-CM | POA: Diagnosis not present

## 2015-07-17 LAB — CBC
HCT: 39.1 % (ref 35.0–45.0)
Hemoglobin: 12.8 g/dL (ref 11.7–15.5)
MCH: 31.7 pg (ref 27.0–33.0)
MCHC: 32.7 g/dL (ref 32.0–36.0)
MCV: 96.8 fL (ref 80.0–100.0)
MPV: 11.3 fL (ref 7.5–12.5)
PLATELETS: 195 10*3/uL (ref 140–400)
RBC: 4.04 MIL/uL (ref 3.80–5.10)
RDW: 13.4 % (ref 11.0–15.0)
WBC: 3.4 10*3/uL — AB (ref 3.8–10.8)

## 2015-07-17 LAB — BASIC METABOLIC PANEL
BUN: 20 mg/dL (ref 7–25)
CALCIUM: 10.1 mg/dL (ref 8.6–10.4)
CO2: 32 mmol/L — AB (ref 20–31)
CREATININE: 0.75 mg/dL (ref 0.60–0.88)
Chloride: 104 mmol/L (ref 98–110)
GLUCOSE: 82 mg/dL (ref 65–99)
Potassium: 4.4 mmol/L (ref 3.5–5.3)
Sodium: 140 mmol/L (ref 135–146)

## 2015-08-03 DIAGNOSIS — L821 Other seborrheic keratosis: Secondary | ICD-10-CM | POA: Diagnosis not present

## 2015-08-03 DIAGNOSIS — L570 Actinic keratosis: Secondary | ICD-10-CM | POA: Diagnosis not present

## 2015-08-10 DIAGNOSIS — S93602A Unspecified sprain of left foot, initial encounter: Secondary | ICD-10-CM | POA: Diagnosis not present

## 2015-09-06 ENCOUNTER — Ambulatory Visit (INDEPENDENT_AMBULATORY_CARE_PROVIDER_SITE_OTHER): Payer: PPO | Admitting: Internal Medicine

## 2015-09-06 ENCOUNTER — Encounter: Payer: Self-pay | Admitting: Internal Medicine

## 2015-09-06 VITALS — BP 138/78 | HR 90 | Temp 97.9°F | Resp 20 | Wt 124.0 lb

## 2015-09-06 DIAGNOSIS — G8929 Other chronic pain: Secondary | ICD-10-CM

## 2015-09-06 DIAGNOSIS — M545 Low back pain: Secondary | ICD-10-CM

## 2015-09-06 DIAGNOSIS — Z0189 Encounter for other specified special examinations: Secondary | ICD-10-CM

## 2015-09-06 DIAGNOSIS — M792 Neuralgia and neuritis, unspecified: Secondary | ICD-10-CM

## 2015-09-06 DIAGNOSIS — I10 Essential (primary) hypertension: Secondary | ICD-10-CM

## 2015-09-06 DIAGNOSIS — G629 Polyneuropathy, unspecified: Secondary | ICD-10-CM

## 2015-09-06 DIAGNOSIS — Z0000 Encounter for general adult medical examination without abnormal findings: Secondary | ICD-10-CM

## 2015-09-06 MED ORDER — HYDROCODONE-ACETAMINOPHEN 5-325 MG PO TABS: 1.0000 | ORAL_TABLET | Freq: Four times a day (QID) | ORAL | Status: DC | PRN

## 2015-09-06 MED ORDER — DULOXETINE HCL 30 MG PO CPEP: 30.0000 mg | ORAL_CAPSULE | Freq: Every day | ORAL | Status: DC

## 2015-09-06 NOTE — Patient Instructions (Addendum)
Ok to stop the celebrex and the amytriptylene  Please take all new medication as prescribed - the cymbalta at 30 mg per day  Please call in 3-4 wks if you feel you would need the cymbalta at 60 mg  Please take all new medication as prescribed - the hydrocodone as needed only  Please continue all other medications as before, and refills have been done if requested.  Please have the pharmacy call with any other refills you may need.  Please keep your appointments with your specialists as you may have planned  Please return in 6 months, or sooner if needed, with Lab testing done 3-5 days before

## 2015-09-06 NOTE — Assessment & Plan Note (Signed)
Mod to severe chronic, for hydrocodone limited rx prn use,  to f/u any worsening symptoms or concerns

## 2015-09-06 NOTE — Progress Notes (Signed)
Subjective:    Patient ID: Ann Booth, female    DOB: 10-17-1926, 82 y.o.   MRN: [id number]  HPI  Pt denies chest pain, increased sob or doe, wheezing, orthopnea, PND, increased LE swelling, palpitations, dizziness or syncope.  Pt denies new neurological symptoms such as new headache, or facial or extremity weakness or numbness   Pt denies polydipsia, polyuria Pt continues to have recurring LBP mod to severe, assoc with severe scoliosis, without change in severity, bowel or bladder change, fever, wt loss,  worsening LE weakness, gait change or falls. Has seen ortho, has severe dz, no sugury amenable., just feels as if her ribs and hip bones "go together". On xarelto, no longer then on celebrex and tramadol for foot issue but did not help the back.  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  Past Medical History  Diagnosis Date  . ALLERGIC RHINITIS   . ANXIETY   . ASTHMA   . BACK PAIN   . CATARACT NOS   . DEPRESSION   . GLAUCOMA NOS   . Hypercalcemia   . MITRAL VALVE PROLAPSE   . OSTEOPOROSIS   . PERIPHERAL NEUROPATHY   . POSITIONAL VERTIGO   . Rosacea   . HTN (hypertension)   . Carotid stenosis 04/27/2012    Mild left on community screening  - Nov 2013  . Atrial fibrillation [facility])    Past Surgical History  Procedure Laterality Date  . Cataract extraction, bilateral    . Posterior chamber interocular lens implant    . Hemorrhoid surgery    .  ankle surgury      left ankle    reports that she has never smoked. She has never used smokeless tobacco. She reports that she does not drink alcohol or use illicit drugs. family history includes CAD in her mother; Cancer in her brother and sister. No Known Allergies Current Outpatient Prescriptions on File Prior to Visit  Medication Sig Dispense Refill  .  ALPRAZolam (XANAX) 0.5 MG tablet 1/2 - 1 tab by mouth at bedtime as needed 30 tablet 5  . b complex vitamins tablet Take 1 tablet by mouth daily.    . benzonatate (TESSALON) 200 MG capsule Take 1 capsule (200 mg total) by mouth 2 (two) times daily as needed for cough. 20 capsule 0  . Biotin 1 MG CAPS Take 1 tablet by mouth daily.    . Calcium Carbonate-Vitamin D (CALCIUM + D PO) Take 1 tablet by mouth daily.     . Coenzyme Q10 (COQ-10) 100 MG CAPS Take by mouth daily.    Marland Kitchen glucosamine-chondroitin 500-400 MG tablet Take 2 tablets by mouth daily.    . IRON, FERROUS GLUCONATE, PO Take 1 tablet by mouth every other day.    . losartan (COZAAR) 100 MG tablet Take 1 tablet (100 mg total) by mouth daily. 90 tablet 3  . LUTEIN PO Take 1 tablet by mouth daily.    . Magnesium Citrate 100 MG TABS Take by mouth daily.    . Multiple Vitamins-Minerals (MACULAR VITAMIN BENEFIT PO) Take 2 tablets by mouth daily.    . Multiple Vitamins-Minerals (MULTIVITAMIN & MINERAL PO) Take 1 tablet by mouth daily.    . Rivaroxaban (XARELTO) 15 MG TABS tablet Take 1 tablet (15 mg total) by mouth daily with supper.  90 tablet 3  . Thiamine HCl (B-1 PO) Take by mouth daily.    . timolol (BETIMOL) 0.5 % ophthalmic solution Place 1 drop into both eyes daily.    . vitamin B-12 (CYANOCOBALAMIN) 1000 MCG tablet Take 1,000 mcg by mouth every other day.      No current facility-administered medications on file prior to visit.    Review of Systems  Constitutional: Negative for unusual diaphoresis or night sweats HENT: Negative for ear swelling or discharge Eyes: Negative for worsening visual haziness  Respiratory: Negative for choking and stridor.   Gastrointestinal: Negative for distension or worsening eructation Genitourinary: Negative for retention or change in urine volume.  Musculoskeletal: Negative for other MSK pain or swelling Skin: Negative for color change and worsening wound Neurological: Negative for tremors and  numbness other than noted  Psychiatric/Behavioral: Negative for decreased concentration or agitation other than above       Objective:   Physical Exam BP 138/78 mmHg  Pulse 90  Temp(Src) 97.9 F (36.6 C) (Oral)  Resp 20  Wt 124 lb (56.246 kg)  SpO2 97% VS noted,  Constitutional: Pt appears in no apparent distress HENT: Head: NCAT.  Right Ear: External ear normal.  Left Ear: External ear normal.  Eyes: . Pupils are equal, round, and reactive to light. Conjunctivae and EOM are normal Neck: Normal range of motion. Neck supple.  Cardiovascular: Normal rate and regular rhythm.   Pulmonary/Chest: Effort normal and breath sounds without rales or wheezing.  Abd:  Soft, NT, ND, + BS Spine: severe scoliosis, diffuse lower back tender without swelling, red or rash Neurological: Pt is alert. Not confused , motor grossly intact Skin: Skin is warm. No rash, no LE edema Psychiatric: Pt behavior is normal. No agitation.     Assessment & Plan:

## 2015-09-06 NOTE — Progress Notes (Signed)
Pre visit review using our clinic review tool, if applicable. No additional management support is needed unless otherwise documented below in the visit note. 

## 2015-09-06 NOTE — Assessment & Plan Note (Signed)
stable overall by history and exam, recent data reviewed with pt, and pt to continue medical treatment as before,  to f/u any worsening symptoms or concerns BP Readings from Last 3 Encounters:  09/06/15 138/78  07/09/15 174/78  06/16/15 130/84

## 2015-09-06 NOTE — Assessment & Plan Note (Signed)
Also for cymbalta 30 mg daily,  to f/u any worsening symptoms or concerns

## 2015-09-24 ENCOUNTER — Emergency Department (HOSPITAL_COMMUNITY)
Admission: EM | Admit: 2015-09-24 | Discharge: 2015-09-25 | Disposition: A | Discharge status: Home / Self Care | Payer: PPO | Attending: Emergency Medicine | Admitting: Emergency Medicine

## 2015-09-24 ENCOUNTER — Encounter (HOSPITAL_COMMUNITY): Payer: Self-pay | Admitting: Nurse Practitioner

## 2015-09-24 ENCOUNTER — Emergency Department (HOSPITAL_COMMUNITY): Payer: PPO

## 2015-09-24 DIAGNOSIS — Y999 Unspecified external cause status: Secondary | ICD-10-CM | POA: Diagnosis not present

## 2015-09-24 DIAGNOSIS — Z79899 Other long term (current) drug therapy: Secondary | ICD-10-CM | POA: Insufficient documentation

## 2015-09-24 DIAGNOSIS — W010XXA Fall on same level from slipping, tripping and stumbling without subsequent striking against object, initial encounter: Secondary | ICD-10-CM | POA: Diagnosis not present

## 2015-09-24 DIAGNOSIS — I10 Essential (primary) hypertension: Secondary | ICD-10-CM | POA: Insufficient documentation

## 2015-09-24 DIAGNOSIS — J45909 Unspecified asthma, uncomplicated: Secondary | ICD-10-CM | POA: Insufficient documentation

## 2015-09-24 DIAGNOSIS — S299XXA Unspecified injury of thorax, initial encounter: Secondary | ICD-10-CM | POA: Diagnosis not present

## 2015-09-24 DIAGNOSIS — Z7901 Long term (current) use of anticoagulants: Secondary | ICD-10-CM | POA: Insufficient documentation

## 2015-09-24 DIAGNOSIS — Y929 Unspecified place or not applicable: Secondary | ICD-10-CM | POA: Diagnosis not present

## 2015-09-24 DIAGNOSIS — S2242XA Multiple fractures of ribs, left side, initial encounter for closed fracture: Secondary | ICD-10-CM | POA: Diagnosis not present

## 2015-09-24 DIAGNOSIS — Y939 Activity, unspecified: Secondary | ICD-10-CM | POA: Diagnosis not present

## 2015-09-24 DIAGNOSIS — W19XXXA Unspecified fall, initial encounter: Secondary | ICD-10-CM

## 2015-09-24 DIAGNOSIS — S2231XA Fracture of one rib, right side, initial encounter for closed fracture: Secondary | ICD-10-CM | POA: Diagnosis not present

## 2015-09-24 NOTE — ED Triage Notes (Signed)
Pt fell in the dark on Saturday night. She was holding a flashlight and when she fell she fell onto the flashlight on her L rib cage. She c/o pain in her L ribcage since. L ribcage tender to palpation. Denies sob, cough, fevers, cp. She is alert and breathing easily

## 2015-09-25 MED ORDER — TRAMADOL HCL 50 MG PO TABS
50.0000 mg | ORAL_TABLET | Freq: Once | ORAL | Status: AC
  Administered 2015-09-25: 50 mg via ORAL
  Filled 2015-09-25: qty 1

## 2015-09-25 NOTE — Discharge Instructions (Signed)
You were found to have a left ninth and 10th rib fracture from a fall 3 days ago. You did not have any sign of a collapsed lung or pneumonia. Please use your incentive spirometer every 1-2 hours while awake for the next 2 weeks to help prevent you from developing pneumonia. You may take your Celebrex as prescribed. You may take tramadol 50 mg every 6 hours as needed for pain. You may take hydrocodone 1-2 tablets every 6 hours as needed for pain. Do not take tramadol and hydrocodone together as they are both narcotics. These medications may make you drowsy, lightheaded so be very careful when taking his medications and do not take them with alcohol, other sedatives and do not drive a car or operate any machinery, make important decisions when on these medications. If you develop worsening pain is uncontrolled with these medications, shortness of breath, fever and productive cough, please return to the hospital. I recommend close follow-up with your primary care physician for pain management.

## 2015-09-25 NOTE — ED Provider Notes (Signed)
By signing my name below, I, Vista Mink, attest that this documentation has been prepared under the direction and in the presence of Rabon Scholle N Emile Kyllo, DO. Electronically signed, Vista Mink, ED Scribe. 09/25/15. 12:34 AM.  TIME SEEN: 12:14 AM  CHIEF COMPLAINT: Left rib pain s/p fall.  HPI:  Ann Booth is a 82 y.o. female with a PMHx of A-fib, HTN, osteroporosis, who presents to the Emergency Department s/p a fall that occurred three days ago. Pt reports pain to her left rib cage ever since. Pt states she was using a flashlight to walk outside in the dark when she accidentally tripped and fell. Pt states she dropped the flashlight during the fall and landed onto the flashlight with her left ribcage; denies hitting head or LOC. Pt reports pain to her left rib cage exacerbated upon taking a deep breath. Pt has a Hx of back pain but denies any acute back pain s/p fall. Pt currently takes Xarelto for her A-fib. Pt denies any numbness or tingling to her extremities. Denies focal weakness. Pt further denies pain to her head or neck, chest, abdomen, upper or lower extremities. Pt does not use a cane or walker to assist with ambulation.   ROS: See HPI Constitutional: no fever  Eyes: no drainage  ENT: no runny nose   Cardiovascular:  no chest pain  Resp: no SOB  GI: no vomiting GU: no dysuria Integumentary: no rash  Allergy: no hives  Musculoskeletal: no leg swelling  Neurological: no slurred speech ROS otherwise negative  PAST MEDICAL HISTORY/PAST SURGICAL HISTORY:  Past Medical History:  Diagnosis Date  . ALLERGIC RHINITIS   . ANXIETY   . ASTHMA   . Atrial fibrillation (HCC)   . BACK PAIN   . Carotid stenosis 04/27/2012   Mild left on community screening  - Nov 2013  . CATARACT NOS   . DEPRESSION   . GLAUCOMA NOS   . HTN (hypertension)   . Hypercalcemia   . MITRAL VALVE PROLAPSE   . OSTEOPOROSIS   . PERIPHERAL NEUROPATHY   . POSITIONAL VERTIGO   . Rosacea     MEDICATIONS:   Prior to Admission medications   Medication Sig Start Date End Date Taking? Authorizing Provider  ALPRAZolam Prudy Feeler) 0.5 MG tablet 1/2 - 1 tab by mouth at bedtime as needed 08/08/14   Corwin Levins, MD  b complex vitamins tablet Take 1 tablet by mouth daily.    Historical Provider, MD  benzonatate (TESSALON) 200 MG capsule Take 1 capsule (200 mg total) by mouth 2 (two) times daily as needed for cough. 06/16/15   Amy Michelle Nasuti, MD  Biotin 1 MG CAPS Take 1 tablet by mouth daily.    Historical Provider, MD  Calcium Carbonate-Vitamin D (CALCIUM + D PO) Take 1 tablet by mouth daily.     Historical Provider, MD  Coenzyme Q10 (COQ-10) 100 MG CAPS Take by mouth daily.    Historical Provider, MD  DULoxetine (CYMBALTA) 30 MG capsule Take 1 capsule (30 mg total) by mouth daily. 09/06/15   Corwin Levins, MD  glucosamine-chondroitin 500-400 MG tablet Take 2 tablets by mouth daily.    Historical Provider, MD  HYDROcodone-acetaminophen (NORCO/VICODIN) 5-325 MG tablet Take 1 tablet by mouth every 6 (six) hours as needed for moderate pain. 09/06/15   Corwin Levins, MD  IRON, FERROUS GLUCONATE, PO Take 1 tablet by mouth every other day.    Historical Provider, MD  losartan (COZAAR) 100 MG tablet Take  1 tablet (100 mg total) by mouth daily. 07/09/15   Lewayne Bunting, MD  LUTEIN PO Take 1 tablet by mouth daily.    Historical Provider, MD  Magnesium Citrate 100 MG TABS Take by mouth daily.    Historical Provider, MD  Multiple Vitamins-Minerals (MACULAR VITAMIN BENEFIT PO) Take 2 tablets by mouth daily.    Historical Provider, MD  Multiple Vitamins-Minerals (MULTIVITAMIN & MINERAL PO) Take 1 tablet by mouth daily.    Historical Provider, MD  Rivaroxaban (XARELTO) 15 MG TABS tablet Take 1 tablet (15 mg total) by mouth daily with supper. 07/09/15   Lewayne Bunting, MD  Thiamine HCl (B-1 PO) Take by mouth daily.    Historical Provider, MD  timolol (BETIMOL) 0.5 % ophthalmic solution Place 1 drop into both eyes daily.     Historical Provider, MD  vitamin B-12 (CYANOCOBALAMIN) 1000 MCG tablet Take 1,000 mcg by mouth every other day.     Historical Provider, MD    ALLERGIES:  No Known Allergies  SOCIAL HISTORY:  Social History  Substance Use Topics  . Smoking status: Never Smoker  . Smokeless tobacco: Never Used  . Alcohol use No    FAMILY HISTORY: Family History  Problem Relation Age of Onset  . Cancer Sister     colon  . Cancer Brother     prostate cancer  . CAD Mother     MI at age 53    EXAM: BP 136/70   Pulse 61   Temp 98.1 F (36.7 C) (Oral)   Resp 16   Ht 5\' 2"  (1.575 m)   Wt 125 lb (56.7 kg)   SpO2 100%   BMI 22.86 kg/m  CONSTITUTIONAL: Elderly, Alert and oriented and responds appropriately to questions. Well-appearing; well-nourished; GCS 15 HEAD: Normocephalic; atraumatic EYES: Conjunctivae clear, PERRL, EOMI ENT: normal nose; no rhinorrhea; moist mucous membranes; pharynx without lesions noted; no dental injury; no septal hematoma NECK: Supple, no meningismus, no LAD; no midline spinal tenderness, step-off or deformity CARD: RRR; S1 and S2 appreciated; no murmurs, no clicks, no rubs, no gallops RESP: Normal chest excursion without splinting or tachypnea; breath sounds clear and equal bilaterally; no wheezes, no rhonchi, no rales; no hypoxia or respiratory distress CHEST:  chest wall stable, no crepitus or ecchymosis or deformity, tender to palpation to left lateral chest wall ABD/GI: Normal bowel sounds; non-distended; soft, non-tender, no rebound, no guarding PELVIS:  stable, nontender to palpation BACK:  The back appears normal and is non-tender to palpation, there is no CVA tenderness; no midline spinal tenderness, step-off or deformity EXT: Normal ROM in all joints; non-tender to palpation; no edema; normal capillary refill; no cyanosis, no bony tenderness or bony deformity of patient's extremities, no joint effusion, no ecchymosis or lacerations    SKIN: Normal color for  age and race; warm NEURO: Moves all extremities equally, sensation to light touch intact diffusely, cranial nerves II through XII intact PSYCH: The patient's mood and manner are appropriate. Grooming and personal hygiene are appropriate.  MEDICAL DECISION MAKING: Patient here with mechanical fall. Occurred 3 days ago. Complaining of left-sided chest wall pain. No associated hematoma. No sign of flow chest. She does appear to have 2 acute rib fracture seen on chest x-ray of the ninth and 10th ribs. No pneumothorax, infiltrate or edema. She denies feeling short of breath. She is not hypoxic. She has prescriptions for Celebrex, tramadol and hydrocodone at home.  I have advised her she can take these medications at  home for pain control. We will discharge her with incentive spirometer. At this time I do not feel she needs admission. I have discussed with her and her friend at bedside that she is at risk for pneumonia and she should use her incentive is prominent regularly and follow-up with her primary care physician. No other sign of trauma on exam. Neurologically intact. Did not hit her head.  At this time, I do not feel there is any life-threatening condition present. I have reviewed and discussed all results (EKG, imaging, lab, urine as appropriate), exam findings with patient/family. I have reviewed nursing notes and appropriate previous records.  I feel the patient is safe to be discharged home without further emergent workup and can continue workup as an outpatient. Discussed usual and customary return precautions. Patient/family verbalize understanding and are comfortable with this plan.  Outpatient follow-up has been provided. All questions have been answered.   This chart was scribed in my presence and reviewed by me personally.      Layla Maw Deloyce Walthers, DO 09/25/15 [phone]

## 2015-09-28 ENCOUNTER — Telehealth: Payer: Self-pay | Admitting: Internal Medicine

## 2015-09-28 NOTE — Telephone Encounter (Signed)
Pt wants to speak to the assistant concern about refill for Tramadol. Please call her back

## 2015-10-01 NOTE — Telephone Encounter (Signed)
Left message on machine for pt to return my call  

## 2015-10-02 NOTE — Telephone Encounter (Signed)
Left message on machine for pt to return my call  

## 2015-12-10 DIAGNOSIS — H401132 Primary open-angle glaucoma, bilateral, moderate stage: Secondary | ICD-10-CM | POA: Diagnosis not present

## 2015-12-14 ENCOUNTER — Other Ambulatory Visit: Payer: Self-pay | Admitting: Internal Medicine

## 2015-12-14 DIAGNOSIS — Z1231 Encounter for screening mammogram for malignant neoplasm of breast: Secondary | ICD-10-CM

## 2016-01-01 DIAGNOSIS — S93602D Unspecified sprain of left foot, subsequent encounter: Secondary | ICD-10-CM | POA: Diagnosis not present

## 2016-01-01 DIAGNOSIS — M25511 Pain in right shoulder: Secondary | ICD-10-CM | POA: Diagnosis not present

## 2016-01-07 DIAGNOSIS — M71571 Other bursitis, not elsewhere classified, right ankle and foot: Secondary | ICD-10-CM | POA: Diagnosis not present

## 2016-02-11 DIAGNOSIS — H401132 Primary open-angle glaucoma, bilateral, moderate stage: Secondary | ICD-10-CM | POA: Diagnosis not present

## 2016-02-14 ENCOUNTER — Ambulatory Visit
Admission: RE | Admit: 2016-02-14 | Discharge: 2016-02-14 | Disposition: A | Discharge status: Home / Self Care | Payer: PPO | Source: Ambulatory Visit | Attending: Internal Medicine | Admitting: Internal Medicine

## 2016-02-14 DIAGNOSIS — Z1231 Encounter for screening mammogram for malignant neoplasm of breast: Secondary | ICD-10-CM | POA: Diagnosis not present

## 2016-02-27 NOTE — Progress Notes (Signed)
HPI: FU atrial fibrillation. Monitor in August of 2014 showed PAF. Toprol DCed previously due to bradycardia. Echocardiogram in November 2015 showed normal LV function, grade 1 diastolic dysfunction, mild mitral regurgitation with systolic prolapse of anterior mitral valve leaflet. Since I last saw her, she denies dyspnea, chest pain, palpitations or syncope. No bleeding.   Current Outpatient Prescriptions  Medication Sig Dispense Refill  . ALPRAZolam (XANAX) 0.5 MG tablet 1/2 - 1 tab by mouth at bedtime as needed 30 tablet 5  . b complex vitamins tablet Take 1 tablet by mouth daily.    . benzonatate (TESSALON) 200 MG capsule Take 1 capsule (200 mg total) by mouth 2 (two) times daily as needed for cough. 20 capsule 0  . Biotin 1 MG CAPS Take 1 tablet by mouth daily.    . Calcium Carbonate-Vitamin D (CALCIUM + D PO) Take 1 tablet by mouth daily.     . Coenzyme Q10 (COQ-10) 100 MG CAPS Take 100 mg by mouth daily.     . DULoxetine (CYMBALTA) 30 MG capsule Take 1 capsule (30 mg total) by mouth daily. 90 capsule 3  . glucosamine-chondroitin 500-400 MG tablet Take 2 tablets by mouth daily.    Marland Kitchen HYDROcodone-acetaminophen (NORCO/VICODIN) 5-325 MG tablet Take 1 tablet by mouth every 6 (six) hours as needed for moderate pain. 30 tablet 0  . IRON, FERROUS GLUCONATE, PO Take 1 tablet by mouth every other day.    . losartan (COZAAR) 100 MG tablet Take 1 tablet (100 mg total) by mouth daily. 90 tablet 3  . LUTEIN PO Take 1 tablet by mouth daily.    . Magnesium Citrate 100 MG TABS Take 100 mg by mouth daily.     . Multiple Vitamins-Minerals (MACULAR VITAMIN BENEFIT PO) Take 2 tablets by mouth daily.    . Multiple Vitamins-Minerals (MULTIVITAMIN & MINERAL PO) Take 1 tablet by mouth daily.    . Rivaroxaban (XARELTO) 15 MG TABS tablet Take 1 tablet (15 mg total) by mouth daily with supper. 90 tablet 3  . Thiamine HCl (B-1 PO) Take 1 tablet by mouth daily.     . timolol (BETIMOL) 0.5 % ophthalmic  solution Place 1 drop into both eyes daily.    . vitamin B-12 (CYANOCOBALAMIN) 1000 MCG tablet Take 1,000 mcg by mouth every other day.      No current facility-administered medications for this visit.      Past Medical History:  Diagnosis Date  . ALLERGIC RHINITIS   . ANXIETY   . ASTHMA   . Atrial fibrillation (HCC)   . BACK PAIN   . Carotid stenosis 04/27/2012   Mild left on community screening  - Nov 2013  . CATARACT NOS   . DEPRESSION   . GLAUCOMA NOS   . HTN (hypertension)   . Hypercalcemia   . MITRAL VALVE PROLAPSE   . OSTEOPOROSIS   . PERIPHERAL NEUROPATHY   . POSITIONAL VERTIGO   . Rosacea     Past Surgical History:  Procedure Laterality Date  .  ankle surgury     left ankle  . CATARACT EXTRACTION, BILATERAL    . HEMORRHOID SURGERY    . posterior chamber interocular lens implant      Social History   Social History  . Marital status: Widowed    Spouse name: N/A  . Number of children: N/A  . Years of education: N/A   Occupational History  . retired [employer]ired   Social History  Main Topics  . Smoking status: Never Smoker  . Smokeless tobacco: Never Used  . Alcohol use No  . Drug use: No  . Sexual activity: Not on file   Other Topics Concern  . Not on file   Social History Narrative  . No narrative on file    Family History  Problem Relation Age of Onset  . CAD Mother     MI at age 69  . Cancer Sister     colon  . Cancer Brother     prostate cancer    ROS: no fevers or chills, productive cough, hemoptysis, dysphasia, odynophagia, melena, hematochezia, dysuria, hematuria, rash, seizure activity, orthopnea, PND, pedal edema, claudication. Remaining systems are negative.  Physical Exam: Well-developed well-nourished in no acute distress.  Skin is warm and dry.  HEENT is normal.  Neck is supple. No bruits Chest is clear to auscultation with normal expansion.  Cardiovascular exam is regular rate and rhythm.  Abdominal exam  nontender or distended. No masses palpated. Extremities show no edema. neuro grossly intact  ECG-Sinus bradycardia at a rate of 56. Cannot rule out prior septal infarct.  A/P  1 Paroxysmal atrial fibrillation-patient remains in sinus rhythm. Continue xarelto; check hgb and renal function.  2 HTN-BP controlled; continue present meds; check K and renal function.  3 MR-mild on most recent echo.  Olga Millers, MD

## 2016-03-04 ENCOUNTER — Encounter: Payer: Self-pay | Admitting: Cardiology

## 2016-03-04 ENCOUNTER — Ambulatory Visit (INDEPENDENT_AMBULATORY_CARE_PROVIDER_SITE_OTHER): Payer: PPO | Admitting: Cardiology

## 2016-03-04 ENCOUNTER — Other Ambulatory Visit: Payer: Self-pay | Admitting: *Deleted

## 2016-03-04 VITALS — BP 136/68 | HR 56 | Ht 62.0 in | Wt 130.0 lb

## 2016-03-04 DIAGNOSIS — I10 Essential (primary) hypertension: Secondary | ICD-10-CM

## 2016-03-04 DIAGNOSIS — I059 Rheumatic mitral valve disease, unspecified: Secondary | ICD-10-CM | POA: Diagnosis not present

## 2016-03-04 DIAGNOSIS — I4891 Unspecified atrial fibrillation: Secondary | ICD-10-CM | POA: Diagnosis not present

## 2016-03-04 LAB — CBC
HCT: 38.1 % (ref 35.0–45.0)
HEMOGLOBIN: 12.3 g/dL (ref 11.7–15.5)
MCH: 31.4 pg (ref 27.0–33.0)
MCHC: 32.3 g/dL (ref 32.0–36.0)
MCV: 97.2 fL (ref 80.0–100.0)
MPV: 11.3 fL (ref 7.5–12.5)
PLATELETS: 229 10*3/uL (ref 140–400)
RBC: 3.92 MIL/uL (ref 3.80–5.10)
RDW: 13.8 % (ref 11.0–15.0)
WBC: 4.7 10*3/uL (ref 3.8–10.8)

## 2016-03-04 MED ORDER — RIVAROXABAN 15 MG PO TABS: 15.0000 mg | ORAL_TABLET | Freq: Every day | ORAL | 3 refills | Status: DC

## 2016-03-04 NOTE — Patient Instructions (Signed)

## 2016-03-05 LAB — BASIC METABOLIC PANEL
BUN: 16 mg/dL (ref 7–25)
CHLORIDE: 102 mmol/L (ref 98–110)
CO2: 27 mmol/L (ref 20–31)
CREATININE: 0.66 mg/dL (ref 0.60–0.88)
Calcium: 10.1 mg/dL (ref 8.6–10.4)
Glucose, Bld: 96 mg/dL (ref 65–99)
Potassium: 4.6 mmol/L (ref 3.5–5.3)
SODIUM: 140 mmol/L (ref 135–146)

## 2016-03-12 ENCOUNTER — Encounter: Payer: PPO | Admitting: Internal Medicine

## 2016-03-21 ENCOUNTER — Encounter: Payer: Self-pay | Admitting: Internal Medicine

## 2016-03-21 ENCOUNTER — Ambulatory Visit (INDEPENDENT_AMBULATORY_CARE_PROVIDER_SITE_OTHER): Payer: PPO | Admitting: Internal Medicine

## 2016-03-21 ENCOUNTER — Other Ambulatory Visit (INDEPENDENT_AMBULATORY_CARE_PROVIDER_SITE_OTHER): Payer: PPO

## 2016-03-21 VITALS — BP 130/70 | HR 72 | Resp 20 | Wt 128.0 lb

## 2016-03-21 DIAGNOSIS — M792 Neuralgia and neuritis, unspecified: Secondary | ICD-10-CM

## 2016-03-21 DIAGNOSIS — H1010 Acute atopic conjunctivitis, unspecified eye: Secondary | ICD-10-CM | POA: Diagnosis not present

## 2016-03-21 DIAGNOSIS — Z0000 Encounter for general adult medical examination without abnormal findings: Secondary | ICD-10-CM

## 2016-03-21 DIAGNOSIS — M79671 Pain in right foot: Secondary | ICD-10-CM

## 2016-03-21 LAB — LIPID PANEL
CHOLESTEROL: 171 mg/dL (ref 0–200)
HDL: 80.1 mg/dL (ref >39.00)
LDL CALC: 73 mg/dL (ref 0–99)
NonHDL: 90.72
TRIGLYCERIDES: 90 mg/dL (ref 0.0–149.0)
Total CHOL/HDL Ratio: 2
VLDL: 18 mg/dL (ref 0.0–40.0)

## 2016-03-21 LAB — CBC WITH DIFFERENTIAL/PLATELET
BASOS ABS: 0 10*3/uL (ref 0.0–0.1)
Basophils Relative: 0.7 % (ref 0.0–3.0)
Eosinophils Absolute: 0.2 10*3/uL (ref 0.0–0.7)
Eosinophils Relative: 3.7 % (ref 0.0–5.0)
HCT: 37.3 % (ref 36.0–46.0)
HEMOGLOBIN: 12.9 g/dL (ref 12.0–15.0)
Lymphocytes Relative: 32.8 % (ref 12.0–46.0)
Lymphs Abs: 1.4 10*3/uL (ref 0.7–4.0)
MCHC: 34.8 g/dL (ref 30.0–36.0)
MCV: 93.8 fl (ref 78.0–100.0)
MONO ABS: 0.5 10*3/uL (ref 0.1–1.0)
Monocytes Relative: 11.4 % (ref 3.0–12.0)
Neutro Abs: 2.1 10*3/uL (ref 1.4–7.7)
Neutrophils Relative %: 51.4 % (ref 43.0–77.0)
Platelets: 192 10*3/uL (ref 150.0–400.0)
RBC: 3.97 Mil/uL (ref 3.87–5.11)
RDW: 14 % (ref 11.5–15.5)
WBC: 4.1 10*3/uL (ref 4.0–10.5)

## 2016-03-21 LAB — BASIC METABOLIC PANEL
BUN: 20 mg/dL (ref 6–23)
CALCIUM: 10.7 mg/dL — AB (ref 8.4–10.5)
CO2: 31 meq/L (ref 19–32)
Chloride: 103 mEq/L (ref 96–112)
Creatinine, Ser: 0.71 mg/dL (ref 0.40–1.20)
GFR: 82.56 mL/min (ref >60.00)
GLUCOSE: 98 mg/dL (ref 70–99)
POTASSIUM: 4.4 meq/L (ref 3.5–5.1)
Sodium: 140 mEq/L (ref 135–145)

## 2016-03-21 LAB — HEPATIC FUNCTION PANEL
ALBUMIN: 4.2 g/dL (ref 3.5–5.2)
ALT: 15 U/L (ref 0–35)
AST: 26 U/L (ref 0–37)
Alkaline Phosphatase: 64 U/L (ref 39–117)
Bilirubin, Direct: 0.1 mg/dL (ref 0.0–0.3)
TOTAL PROTEIN: 7.5 g/dL (ref 6.0–8.3)
Total Bilirubin: 0.5 mg/dL (ref 0.2–1.2)

## 2016-03-21 LAB — URINALYSIS, ROUTINE W REFLEX MICROSCOPIC
BILIRUBIN URINE: NEGATIVE
HGB URINE DIPSTICK: NEGATIVE
KETONES UR: NEGATIVE
LEUKOCYTES UA: NEGATIVE
NITRITE: NEGATIVE
Specific Gravity, Urine: 1.015 (ref 1.000–1.030)
TOTAL PROTEIN, URINE-UPE24: NEGATIVE
URINE GLUCOSE: NEGATIVE
UROBILINOGEN UA: 0.2 (ref 0.0–1.0)
pH: 6.5 (ref 5.0–8.0)

## 2016-03-21 LAB — TSH: TSH: 1.37 u[IU]/mL (ref 0.35–4.50)

## 2016-03-21 MED ORDER — GABAPENTIN 100 MG PO CAPS: 100.0000 mg | ORAL_CAPSULE | Freq: Three times a day (TID) | ORAL | 5 refills | Status: DC

## 2016-03-21 NOTE — Patient Instructions (Addendum)
Ok to Please take all new medication as prescribed - the gabapentin  You can also try OTC Zaditor for eye allerigies  Please continue all other medications as before, and refills have been done if requested.  Please have the pharmacy call with any other refills you may need.  Please continue your efforts at being more active, low cholesterol diet, and weight control.  You are otherwise up to date with prevention measures today.  You will be contacted regarding the referral for: podiatry  Please keep your appointments with your specialists as you may have planned  Please go to the LAB in the Basement (turn left off the elevator) for the tests to be done today  You will be contacted by phone if any changes need to be made immediately.  Otherwise, you will receive a letter about your results with an explanation, but please check with MyChart first.  Please remember to sign up for MyChart if you have not done so, as this will be important to you in the future with finding out test results, communicating by private email, and scheduling acute appointments online when needed.  Please return in 6 months, or sooner if needed

## 2016-03-21 NOTE — Progress Notes (Signed)
Subjective:    Patient ID: Ann Booth, female    DOB: 03/22/1926, 83 y.o.   MRN: [id number]  HPI  Here for wellness and f/u;  Overall doing ok;  Pt denies Chest pain, worsening SOB, DOE, wheezing, orthopnea, PND, worsening LE edema, palpitations, dizziness or syncope.  Pt denies neurological change such as new headache, facial or extremity weakness.  Pt denies polydipsia, polyuria, or low sugar symptoms. Pt states overall good compliance with treatment and medications, good tolerability, and has been trying to follow appropriate diet.  Pt denies worsening depressive symptoms, suicidal ideation or panic. No fever, night sweats, wt loss, loss of appetite, or other constitutional symptoms.  Pt states good ability with ADL's, has low fall risk, home safety reviewed and adequate, no other significant changes in hearing or vision, and only occasionally active with exercise,   Does have worsening ongoing right foot pain, saw podiatry Mar 23 2015, the Dr Victorino Dike in Nov 2017, still quite sore to lateral mid right foot close but not quite ulcerated raised lesion, so she cannot lay at night with left on right foot.  No other new history except having bilat eye redness and itching and slight d/c for 2 wks without fever or vision change. Past Medical History:  Diagnosis Date  . ALLERGIC RHINITIS   . ANXIETY   . ASTHMA   . Atrial fibrillation (HCC)   . BACK PAIN   . Carotid stenosis 04/27/2012   Mild left on community screening  - Nov 2013  . CATARACT NOS   . DEPRESSION   . GLAUCOMA NOS   . HTN (hypertension)   . Hypercalcemia   . MITRAL VALVE PROLAPSE   . OSTEOPOROSIS   . PERIPHERAL NEUROPATHY   . POSITIONAL VERTIGO   . Rosacea    Past Surgical History:  Procedure Laterality Date  .  ankle surgury     left ankle  . CATARACT EXTRACTION, BILATERAL    . HEMORRHOID SURGERY    . posterior chamber interocular lens implant      reports that she has never smoked. She has never used smokeless  tobacco. She reports that she does not drink alcohol or use drugs. family history includes CAD in her mother; Cancer in her brother and sister. Allergies  Allergen Reactions  . Cymbalta [Duloxetine Hcl] Other (See Comments)   Current Outpatient Prescriptions on File Prior to Visit  Medication Sig Dispense Refill  . ALPRAZolam (XANAX) 0.5 MG tablet 1/2 - 1 tab by mouth at bedtime as needed 30 tablet 5  . b complex vitamins tablet Take 1 tablet by mouth daily.    . benzonatate (TESSALON) 200 MG capsule Take 1 capsule (200 mg total) by mouth 2 (two) times daily as needed for cough. 20 capsule 0  . Biotin 1 MG CAPS Take 1 tablet by mouth daily.    . Calcium Carbonate-Vitamin D (CALCIUM + D PO) Take 1 tablet by mouth daily.     . Coenzyme Q10 (COQ-10) 100 MG CAPS Take 100 mg by mouth daily.     Marland Kitchen glucosamine-chondroitin 500-400 MG tablet Take 2 tablets by mouth daily.    . IRON, FERROUS GLUCONATE, PO Take 1 tablet by mouth every other day.    . losartan (COZAAR) 100 MG tablet Take 1 tablet (100 mg total) by mouth daily. 90 tablet 3  . LUTEIN PO Take 1 tablet by mouth daily.    . Magnesium Citrate 100 MG TABS Take 100 mg by mouth daily.     Marland Kitchen  Multiple Vitamins-Minerals (MACULAR VITAMIN BENEFIT PO) Take 2 tablets by mouth daily.    . Multiple Vitamins-Minerals (MULTIVITAMIN & MINERAL PO) Take 1 tablet by mouth daily.    . Rivaroxaban (XARELTO) 15 MG TABS tablet Take 1 tablet (15 mg total) by mouth daily with supper. 90 tablet 3  . Thiamine HCl (B-1 PO) Take 1 tablet by mouth daily.     . timolol (BETIMOL) 0.5 % ophthalmic solution Place 1 drop into both eyes daily.    . vitamin B-12 (CYANOCOBALAMIN) 1000 MCG tablet Take 1,000 mcg by mouth every other day.      No current facility-administered medications on file prior to visit.    Review of Systems Constitutional: Negative for increased diaphoresis, or other activity, appetite or siginficant weight change other than noted HENT: Negative for  worsening hearing loss, ear pain, facial swelling, mouth sores and neck stiffness.   Eyes: Negative for other worsening pain, redness or visual disturbance.  Respiratory: Negative for choking or stridor Cardiovascular: Negative for other chest pain and palpitations.  Gastrointestinal: Negative for worsening diarrhea, blood in stool, or abdominal distention Genitourinary: Negative for hematuria, flank pain or change in urine volume.  Musculoskeletal: Negative for myalgias or other joint complaints.  Skin: Negative for other color change and wound or drainage.  Neurological: Negative for syncope and numbness. other than noted Hematological: Negative for adenopathy. or other swelling Psychiatric/Behavioral: Negative for hallucinations, SI, self-injury, decreased concentration or other worsening agitation.  All other system neg per pt    Objective:   Physical Exam BP 130/70   Pulse 72   Resp 20   Wt 128 lb (58.1 kg)   SpO2 96%   BMI 23.41 kg/m  VS noted,  Constitutional: Pt is oriented to person, place, and time. Appears well-developed and well-nourished, in no significant distress Head: Normocephalic and atraumatic  Eyes: Conjunctivae with mild erythema and EOM are normal. Pupils are equal, round, and reactive to light Right Ear: External ear normal.  Left Ear: External ear normal Nose: Nose normal.  Mouth/Throat: Oropharynx is clear and moist  Neck: Normal range of motion. Neck supple. No JVD present. No tracheal deviation present or significant neck LA or mass Cardiovascular: Normal rate, regular rhythm, normal heart sounds and intact distal pulses.   Pulmonary/Chest: Effort normal and breath sounds without rales or wheezing  Abdominal: Soft. Bowel sounds are normal. NT. No HSM  Musculoskeletal: Normal range of motion. Exhibits no edema; right lateral mid foot with chronic appaering tender raised 1 cm area without ulcer or drainage Lymphadenopathy: Has no cervical adenopathy.    Neurological: Pt is alert and oriented to person, place, and time. Pt has normal reflexes. No cranial nerve deficit. Motor grossly intact, + sensory decreased to LT to distal LE's Skin: Skin is warm and dry. No rash noted or new ulcers Psychiatric:  Has normal mood and affect. Behavior is normal.  No other new exam findings    Assessment & Plan:

## 2016-03-21 NOTE — Assessment & Plan Note (Signed)
Ok for otc zaditor prn,  to f/u any worsening symptoms or concerns

## 2016-03-21 NOTE — Progress Notes (Signed)
Pre visit review using our clinic review tool, if applicable. No additional management support is needed unless otherwise documented below in the visit note. 

## 2016-03-21 NOTE — Assessment & Plan Note (Signed)

## 2016-03-21 NOTE — Assessment & Plan Note (Signed)
With unsual right mid lateral foot lesion, for podiatry referral

## 2016-03-21 NOTE — Assessment & Plan Note (Addendum)
Mild to mod, for gabapentin asd,,  to f/u any worsening symptoms or concerns  In addition to the time spent performing CPE, I spent an additional 15 minutes face to face,in which greater than 50% of this time was spent in counseling and coordination of care for patient's illness as documented.

## 2016-04-10 ENCOUNTER — Encounter: Payer: Self-pay | Admitting: Podiatry

## 2016-04-10 ENCOUNTER — Ambulatory Visit (INDEPENDENT_AMBULATORY_CARE_PROVIDER_SITE_OTHER): Payer: PPO | Admitting: Podiatry

## 2016-04-10 DIAGNOSIS — M71571 Other bursitis, not elsewhere classified, right ankle and foot: Secondary | ICD-10-CM | POA: Diagnosis not present

## 2016-04-10 DIAGNOSIS — M7751 Other enthesopathy of right foot: Secondary | ICD-10-CM

## 2016-04-10 NOTE — Progress Notes (Signed)
She presents today for follow-up of her bursitis fifth metatarsal base on the right foot. She states it is still exquisitely sore I can hardly lay on the sheets at night. She wears shoe gear and they keeps pressure off of that area so she does fine during the day.  Objective: Vital signs are stable alert and oriented 3. Pulses are palpable. Neurologic sensorium is intact. Deep tendon reflexes are intact muscle strength is normal. Orthopedic evaluation demonstrate all joints distal to the ankle full range of motion I crepitation. Very thin skin within callus overlying it the bursitis has resolved but now she does have just a plain scan there with nothing as a pad.  Assessment: Painful fifth metatarsal base of the right foot.  Plan: We will call her once we receive heel pillows. We did put her in a silicone pad today.

## 2016-05-08 ENCOUNTER — Telehealth: Payer: Self-pay | Admitting: Internal Medicine

## 2016-05-08 NOTE — Telephone Encounter (Signed)
Routing to dr john, please advise, I will call patient back, thanks 

## 2016-05-08 NOTE — Telephone Encounter (Signed)
Patient called in about the sore on her foot. You saw her back in January about this. She states its no better and would like some abx called in. I informed her she may need an appointment since its been a couple months. Yet she asked if we could just see what you had to say first. Please advise, Thank you.

## 2016-05-08 NOTE — Telephone Encounter (Signed)
Unless there is new fever, redness, swelling or drainage, this would not likely need an antibiotic. Pt was treated for bursitis right foot recently.   Please consider OV with podiatry in follow up, or even Dr Smith/sports medicine in the office.

## 2016-05-09 NOTE — Telephone Encounter (Signed)
Patient is afebrile, but has new redness,swelling and pain in this same area---no drainage----patient stated foot doctor says he has done everything he can---routing back to dr Jonny Ruiz, please advise, thanks

## 2016-05-09 NOTE — Telephone Encounter (Signed)
Sounds like same problem has recurred.  I see no indication for an antibiotic, thanks

## 2016-05-09 NOTE — Telephone Encounter (Signed)
Left message asking patient to call back if she would like to schedule appt to see dr smith/sports medicine

## 2016-05-14 NOTE — Telephone Encounter (Signed)
Patient has an appointment with Dr. Katrinka Blazing

## 2016-05-24 NOTE — Progress Notes (Signed)
[name] [address] [place], [place] [address] Phone: [phone] Subjective:    I'm seeing this patient by the request  of:    CC: Right foot pain  [id number]  Ann Booth is a 83 y.o. female coming in with complaint of right foot pain. Patient has had chronic problems with the right foot. Has been seeing a podiatrist. Has been diagnosed with more of a bursitis. Patient has been given heel pads as well as a metatarsal pad and patient states no improvement. Pain is more on the lateral aspect of the fifth toe. Patient states it hurts with lifting her foot and the toes but even hurts with any type of ambulation at this point. Patient denies any numbness, but patient does have a history of peripheral neuropathy and does not know if she has regular feeling at all. Patient denies any swelling. Does not remember any true injury to the area. Rates the severity of pain a 6 out of 10 and worsening.     Past Medical History:  Diagnosis Date  . ALLERGIC RHINITIS   . ANXIETY   . ASTHMA   . Atrial fibrillation (HCC)   . BACK PAIN   . Carotid stenosis 04/27/2012   Mild left on community screening  - Nov 2013  . CATARACT NOS   . DEPRESSION   . GLAUCOMA NOS   . HTN (hypertension)   . Hypercalcemia   . MITRAL VALVE PROLAPSE   . OSTEOPOROSIS   . PERIPHERAL NEUROPATHY   . POSITIONAL VERTIGO   . Rosacea    Past Surgical History:  Procedure Laterality Date  .  ankle surgury     left ankle  . CATARACT EXTRACTION, BILATERAL    . HEMORRHOID SURGERY    . posterior chamber interocular lens implant     Social History   Social History  . Marital status: Widowed    Spouse name: N/A  . Number of children: N/A  . Years of education: N/A   Occupational History  . retired [employer] Retired   Social History Main Topics  . Smoking status: Never Smoker  . Smokeless tobacco: Never Used  . Alcohol use No  . Drug use: No  . Sexual activity: Not Asked    Other Topics Concern  . None   Social History Narrative  . None   Allergies  Allergen Reactions  . Cymbalta [Duloxetine Hcl] Other (See Comments)   Family History  Problem Relation Age of Onset  . CAD Mother     MI at age 20  . Cancer Sister     colon  . Cancer Brother     prostate cancer    Past medical history, social, surgical and family history all reviewed in electronic medical record.  No pertanent information unless stated regarding to the chief complaint.   Review of Systems:Review of systems updated and as accurate as of 05/26/16  No headache, visual changes, nausea, vomiting, diarrhea, constipation, dizziness, abdominal pain, skin rash, fevers, chills, night sweats, weight loss, swollen lymph nodes, body aches, joint swelling, muscle aches, chest pain, shortness of breath, mood changes.  Positive for muscle aches and bodies.  Objective  Blood pressure 132/74, pulse (!) 47, resp. rate 16, weight 129 lb 4 oz (58.6 kg), SpO2 97 %. Systems examined below as of 05/26/16   General: No apparent distress alert and oriented x3 mood and affect normal, dressed appropriately.  HEENT: Pupils equal, extraocular movements intact  Respiratory:  Patient's speak in full sentences and does not appear short of breath  Cardiovascular: No lower extremity edema, non tender, no erythema  Skin: Warm dry intact with no signs of infection or rash on extremities or on axial skeleton.  Abdomen: Soft nontender  Neuro: Cranial nerves II through XII are intact, neurovascularly intact in all extremities with 2+ DTRs and 2+ pulses.  Lymph: No lymphadenopathy of posterior or anterior cervical chain or axillae bilaterally.  Gait Mild antalgic gait with kyphosis MSK:  Non tender with full range of motion and good stability and symmetric strength and tone of shoulders, elbows, wrist, hip, knee and ankles bilaterally. Severe arthritic changes of multiple joints Foot exam shows patient does have severe  breakdown of the transverse arch. Patient also has what appears to be a large bony normality of the fifth metatarsal likely secondary to a injury. Patient has a large callus formation noted in this area. Neurovascularly intact but symmetric.  After verbal consent patient was prepped with alcohol swab and with a 10 blade patient did have removal callus formation over the base of the fifth metatarsal. Minimal blood noted once to healthy skin. Triple Antibiotic ointment was placed in bending placed over a. Post debridement instructions given.   Impression and Recommendations:     This case required medical decision making of moderate complexity.      Note: This dictation was prepared with Dragon dictation along with smaller phrase technology. Any transcriptional errors that result from this process are unintentional.

## 2016-05-26 ENCOUNTER — Encounter: Payer: Self-pay | Admitting: Family Medicine

## 2016-05-26 ENCOUNTER — Ambulatory Visit (INDEPENDENT_AMBULATORY_CARE_PROVIDER_SITE_OTHER): Payer: PPO | Admitting: Family Medicine

## 2016-05-26 DIAGNOSIS — M792 Neuralgia and neuritis, unspecified: Secondary | ICD-10-CM

## 2016-05-26 DIAGNOSIS — L84 Corns and callosities: Secondary | ICD-10-CM

## 2016-05-26 NOTE — Assessment & Plan Note (Signed)
I believe the patient is having more of a neuropathic pain. I think this is secondary to more of a peripheral neuropathy. This is likely contributing to the breakdown or fluid. We discussed proper shoes, discussed icing regimen, we discussed what to avoid. Try some over-the-counter orthotics. She will try these different changes and come back and see me again in 3 weeks.

## 2016-05-26 NOTE — Patient Instructions (Addendum)
God to see you  Wart removal cream daily in the area and cover with a band aid for 1 week Arnica lotion in the area can help Shoes with a rigid sole shoe would be better for now.  Avoid being barefoot.  In tennis shoes do not lace the middle eye on the shoe B12 daily  B6 200mg  daily  See me again in 3 weeks.

## 2016-05-26 NOTE — Progress Notes (Signed)
Pre-visit discussion using our clinic review tool. No additional management support is needed unless otherwise documented below in the visit note.  

## 2016-06-19 ENCOUNTER — Ambulatory Visit: Payer: PPO | Admitting: Internal Medicine

## 2016-06-26 ENCOUNTER — Ambulatory Visit (INDEPENDENT_AMBULATORY_CARE_PROVIDER_SITE_OTHER): Payer: PPO | Admitting: Family Medicine

## 2016-06-26 ENCOUNTER — Encounter: Payer: Self-pay | Admitting: Internal Medicine

## 2016-06-26 DIAGNOSIS — L84 Corns and callosities: Secondary | ICD-10-CM | POA: Diagnosis not present

## 2016-06-26 NOTE — Assessment & Plan Note (Signed)
Better at this point. No change in management. We discussed continuing to try to keep the skin from getting the dryness. Patient will continue to be active otherwise. Follow-up with me again as needed

## 2016-06-26 NOTE — Patient Instructions (Addendum)
Good to see you  The foot looks great  Continue to use some type of lotion daily and would buff the skin 1-2 times a week.  Do not lace the middle eye of the shoe See me again when you need me

## 2016-06-26 NOTE — Progress Notes (Signed)
Pre visit review using our clinic review tool, if applicable. No additional management support is needed unless otherwise documented below in the visit note. 

## 2016-06-26 NOTE — Progress Notes (Signed)
[name] [address] [place], [place] [address] Phone: [phone] Subjective:    I'm seeing this patient by the request  of:    CC: Right foot pain f/u  [id number]  Ann Booth is a 83 y.o. female coming in with complaint of right foot pain. Patient has had chronic problems with the right foot. Patient was seen previously by me and did have a callus formation over the fifth metatarsal area. We did do debridement. Patient has been doing wart removal cream, cushioning, as well as avoiding complete friction in the area with certain shoes. Patient states it is feeling 85-95% better. Very happy with the results. Only has pain when she pushes on the area. No pain with walking and no pain at night.     Past Medical History:  Diagnosis Date  . ALLERGIC RHINITIS   . ANXIETY   . ASTHMA   . Atrial fibrillation (HCC)   . BACK PAIN   . Carotid stenosis 04/27/2012   Mild left on community screening  - Nov 2013  . CATARACT NOS   . DEPRESSION   . GLAUCOMA NOS   . HTN (hypertension)   . Hypercalcemia   . MITRAL VALVE PROLAPSE   . OSTEOPOROSIS   . PERIPHERAL NEUROPATHY   . POSITIONAL VERTIGO   . Rosacea    Past Surgical History:  Procedure Laterality Date  .  ankle surgury     left ankle  . CATARACT EXTRACTION, BILATERAL    . HEMORRHOID SURGERY    . posterior chamber interocular lens implant     Social History   Social History  . Marital status: Widowed    Spouse name: N/A  . Number of children: N/A  . Years of education: N/A   Occupational History  . retired [employer] Retired   Social History Main Topics  . Smoking status: Never Smoker  . Smokeless tobacco: Never Used  . Alcohol use No  . Drug use: No  . Sexual activity: Not Asked   Other Topics Concern  . None   Social History Narrative  . None   Allergies  Allergen Reactions  . Cymbalta [Duloxetine Hcl] Other (See Comments)   Family History  Problem Relation Age of  Onset  . CAD Mother     MI at age 10  . Cancer Sister     colon  . Cancer Brother     prostate cancer    Past medical history, social, surgical and family history all reviewed in electronic medical record.  No pertanent information unless stated regarding to the chief complaint.   Review of Systems:Review of systems updated and as accurate as of 06/26/16  No headache, visual changes, nausea, vomiting, diarrhea, constipation, dizziness, abdominal pain, skin rash, fevers, chills, night sweats, weight loss, swollen lymph nodes, body aches, joint swelling, muscle aches, chest pain, shortness of breath, mood changes.  Positive for muscle aches and bodies.  Objective  Blood pressure 134/74, pulse 63, height 5\' 2"  (1.575 m), weight 128 lb (58.1 kg), SpO2 98 %. Systems examined below as of 06/26/16   Systems examined below as of 06/26/16 General: NAD A&O x3 mood, affect normal  HEENT: Pupils equal, extraocular movements intact no nystagmus Respiratory: not short of breath at rest or with speaking Cardiovascular: No lower extremity edema, non tender Skin: Warm dry intact with no signs of infection or rash on extremities or on axial skeleton. Abdomen: Soft nontender, no masses Neuro: Cranial nerves  intact, neurovascularly intact in all extremities with 2+ DTRs and 2+ pulses. Lymph: No lymphadenopathy appreciated today  Gait normal with good balance and coordination.   MSK:  Non tender with full range of motion and good stability and symmetric strength and tone of shoulders, elbows, wrist, hip, knee and ankles bilaterally. Severe arthritic changes of multiple joints Foot exam shows patient does have severe breakdown of the transverse arch. Fifth metatarsal area and does not have any true callus formation at this point. Patient's has very good news skin with very mild microvascular rosacea the area.     Impression and Recommendations:     This case required medical decision making of  moderate complexity.      Note: This dictation was prepared with Dragon dictation along with smaller phrase technology. Any transcriptional errors that result from this process are unintentional.

## 2016-07-10 ENCOUNTER — Telehealth: Payer: Self-pay | Admitting: Cardiology

## 2016-07-10 ENCOUNTER — Other Ambulatory Visit: Payer: Self-pay | Admitting: Cardiology

## 2016-07-10 DIAGNOSIS — I10 Essential (primary) hypertension: Secondary | ICD-10-CM

## 2016-07-10 NOTE — Telephone Encounter (Signed)
Not know drug-drug interactions between resveratrol and xarelto.  Okay to take but keep dose at maximum of 500mg  per day.

## 2016-07-10 NOTE — Telephone Encounter (Signed)
New message      Pt c/o medication issue:  1. Name of Medication: xarelto 2. How are you currently taking this medication (dosage and times per day)? 15mg  3. Are you having a reaction (difficulty breathing--STAT)? no 4. What is your medication issue?  Can patient take reszeratrol 500mg  since she is on blood thinner?

## 2016-07-18 DIAGNOSIS — H401123 Primary open-angle glaucoma, left eye, severe stage: Secondary | ICD-10-CM | POA: Diagnosis not present

## 2016-07-18 DIAGNOSIS — H401112 Primary open-angle glaucoma, right eye, moderate stage: Secondary | ICD-10-CM | POA: Diagnosis not present

## 2016-07-28 DIAGNOSIS — H401112 Primary open-angle glaucoma, right eye, moderate stage: Secondary | ICD-10-CM | POA: Diagnosis not present

## 2016-07-28 DIAGNOSIS — H401123 Primary open-angle glaucoma, left eye, severe stage: Secondary | ICD-10-CM | POA: Diagnosis not present

## 2016-09-18 ENCOUNTER — Encounter: Payer: Self-pay | Admitting: Internal Medicine

## 2016-09-18 ENCOUNTER — Ambulatory Visit (INDEPENDENT_AMBULATORY_CARE_PROVIDER_SITE_OTHER)
Admission: RE | Admit: 2016-09-18 | Discharge: 2016-09-18 | Disposition: A | Discharge status: Home / Self Care | Payer: PPO | Source: Ambulatory Visit | Attending: Family Medicine | Admitting: Family Medicine

## 2016-09-18 ENCOUNTER — Ambulatory Visit (INDEPENDENT_AMBULATORY_CARE_PROVIDER_SITE_OTHER): Payer: PPO | Admitting: Internal Medicine

## 2016-09-18 ENCOUNTER — Ambulatory Visit (INDEPENDENT_AMBULATORY_CARE_PROVIDER_SITE_OTHER): Payer: PPO | Admitting: Family Medicine

## 2016-09-18 VITALS — BP 130/84 | HR 63 | Ht 62.0 in | Wt 128.0 lb

## 2016-09-18 DIAGNOSIS — I10 Essential (primary) hypertension: Secondary | ICD-10-CM | POA: Diagnosis not present

## 2016-09-18 DIAGNOSIS — J45909 Unspecified asthma, uncomplicated: Secondary | ICD-10-CM | POA: Diagnosis not present

## 2016-09-18 DIAGNOSIS — F32A Depression, unspecified: Secondary | ICD-10-CM

## 2016-09-18 DIAGNOSIS — M25562 Pain in left knee: Secondary | ICD-10-CM | POA: Diagnosis not present

## 2016-09-18 DIAGNOSIS — M1712 Unilateral primary osteoarthritis, left knee: Secondary | ICD-10-CM | POA: Diagnosis not present

## 2016-09-18 DIAGNOSIS — G8929 Other chronic pain: Secondary | ICD-10-CM

## 2016-09-18 DIAGNOSIS — F329 Major depressive disorder, single episode, unspecified: Secondary | ICD-10-CM

## 2016-09-18 NOTE — Patient Instructions (Signed)
.  Please continue all other medications as before, and refills have been done if requested.  Please have the pharmacy call with any other refills you may need.  Please keep your appointments with your specialists as you may have planned  No further labs are needed today  Please see Dr Jordan Likes for your left knee today

## 2016-09-18 NOTE — Patient Instructions (Signed)
Thank you for coming in,   I will call you with results from her x-ray today and we will determine what to do after that.   Please feel free to call with any questions or concerns at any time, at [phone]. --Dr. Jordan Likes

## 2016-09-18 NOTE — Assessment & Plan Note (Signed)
Pain is likely related to underlying arthritis. She has not have significant pain at baseline but only with certain movements. - X-ray today - Can consider physical therapy. Injection therapy can be used when she has pain that is significant and unrelenting. - She'll follow-up as needed.

## 2016-09-18 NOTE — Progress Notes (Signed)
Ann Booth - 83 y.o. female MRN [id number]  Date of birth: [birth year]  SUBJECTIVE:  Including CC & ROS.  No chief complaint on file.  Ann Booth is an 83 year old female is presenting with left knee pain. This knee pain is acute on chronic in nature. The pain is worse when she lies down at night and crosses her legs. She denies any significant swelling. She does feel like her knees going to give out on her. She has not had any injections previously. She had questions as to whether the gel injections would help for her. She denies any prior knee x-rays. She's not taking any medications on a regular basis. She has not tried any physical therapy or any bracing previously.  Review of Systems  Cardiovascular: Negative for leg swelling.  Musculoskeletal: Positive for gait problem. Negative for joint swelling.  Skin: Negative for rash.  Neurological: Negative for weakness and numbness.    HISTORY: Past Medical, Surgical, Social, and Family History Reviewed & Updated per EMR.   Pertinent Historical Findings include:  Past Medical History:  Diagnosis Date  . ALLERGIC RHINITIS   . ANXIETY   . ASTHMA   . Atrial fibrillation (HCC)   . BACK PAIN   . Carotid stenosis 04/27/2012   Mild left on community screening  - Nov 2013  . CATARACT NOS   . DEPRESSION   . GLAUCOMA NOS   . HTN (hypertension)   . Hypercalcemia   . MITRAL VALVE PROLAPSE   . OSTEOPOROSIS   . PERIPHERAL NEUROPATHY   . POSITIONAL VERTIGO   . Rosacea     Past Surgical History:  Procedure Laterality Date  .  ankle surgury     left ankle  . CATARACT EXTRACTION, BILATERAL    . HEMORRHOID SURGERY    . posterior chamber interocular lens implant      Allergies  Allergen Reactions  . Cymbalta [Duloxetine Hcl] Other (See Comments)    Family History  Problem Relation Age of Onset  . CAD Mother        MI at age 43  . Cancer Sister        colon  . Cancer Brother        prostate cancer     Social History    Social History  . Marital status: Widowed    Spouse name: N/A  . Number of children: N/A  . Years of education: N/A   Occupational History  . retired [employer] Retired   Social History Main Topics  . Smoking status: Never Smoker  . Smokeless tobacco: Never Used  . Alcohol use No  . Drug use: No  . Sexual activity: Not on file   Other Topics Concern  . Not on file   Social History Narrative  . No narrative on file     PHYSICAL EXAM:  Physical Exam Gen: NAD, alert, cooperative with exam,  ENT: normal lips, normal nasal mucosa,  Eye: PERRL, normal conjunctiva and lids CV:  no edema, +2 pedal pulses   Resp: no accessory muscle use, non-labored,  GI: no masses or tenderness, no hernia  Skin: no rashes, no areas of induration  Neuro: +2 patellar DTR's, normal sensation to touch Psych:  normal insight, alert and oriented MSK: Obvious scoliosis present Left knee: Trace effusion. Tenderness to palpation over the medial joint line. Normal flexion and extension. Normal strength resistance. Negative McMurray's test. No pain with patellar grind. Neurovascularly intact   ASSESSMENT & PLAN:   Chronic  pain of left knee Pain is likely related to underlying arthritis. She has not have significant pain at baseline but only with certain movements. - X-ray today - Can consider physical therapy. Injection therapy can be used when she has pain that is significant and unrelenting. - She'll follow-up as needed.

## 2016-09-18 NOTE — Progress Notes (Signed)
Subjective:    Patient ID: Ann Booth, female    DOB: [birth year], 83 y.o.   MRN: [id number]  HPI  Here to f/u; overall doing ok,  Pt denies chest pain, increasing sob or doe, wheezing, orthopnea, PND, increased LE swelling, palpitations, dizziness or syncope.  Pt denies new neurological symptoms such as new headache, or facial or extremity weakness or numbness.  Pt denies polydipsia, polyuria, or low sugar episode.  Pt states overall good compliance with meds, mostly trying to follow appropriate diet, with wt overall stable,  but little exercise however, less recently due to left knee pain and swelling now worse in the past 3 days, without giveaways or falls. BP Readings from Last 3 Encounters:  09/18/16 130/84  06/26/16 134/74  05/26/16 132/74   Wt Readings from Last 3 Encounters:  09/18/16 128 lb (58.1 kg)  06/26/16 128 lb (58.1 kg)  05/26/16 129 lb 4 oz (58.6 kg)   Past Medical History:  Diagnosis Date  . ALLERGIC RHINITIS   . ANXIETY   . ASTHMA   . Atrial fibrillation (HCC)   . BACK PAIN   . Carotid stenosis 04/27/2012   Mild left on community screening  - Nov 2013  . CATARACT NOS   . DEPRESSION   . GLAUCOMA NOS   . HTN (hypertension)   . Hypercalcemia   . MITRAL VALVE PROLAPSE   . OSTEOPOROSIS   . PERIPHERAL NEUROPATHY   . POSITIONAL VERTIGO   . Rosacea    Past Surgical History:  Procedure Laterality Date  .  ankle surgury     left ankle  . CATARACT EXTRACTION, BILATERAL    . HEMORRHOID SURGERY    . posterior chamber interocular lens implant      reports that she has never smoked. She has never used smokeless tobacco. She reports that she does not drink alcohol or use drugs. family history includes CAD in her mother; Cancer in her brother and sister. Allergies  Allergen Reactions  . Cymbalta [Duloxetine Hcl] Other (See Comments)   Current Outpatient Prescriptions on File Prior to Visit  Medication Sig Dispense Refill  . ALPRAZolam (XANAX) 0.5 MG tablet 1/2  - 1 tab by mouth at bedtime as needed 30 tablet 5  . b complex vitamins tablet Take 1 tablet by mouth daily.    . Biotin 1 MG CAPS Take 1 tablet by mouth daily.    . Calcium Carbonate-Vitamin D (CALCIUM + D PO) Take 1 tablet by mouth daily.     . Coenzyme Q10 (COQ-10) 100 MG CAPS Take 100 mg by mouth daily.     Marland Kitchen gabapentin (NEURONTIN) 100 MG capsule Take 1 capsule (100 mg total) by mouth 3 (three) times daily. 90 capsule 5  . glucosamine-chondroitin 500-400 MG tablet Take 2 tablets by mouth daily.    . IRON, FERROUS GLUCONATE, PO Take 1 tablet by mouth every other day.    . losartan (COZAAR) 100 MG tablet TAKE 1 TABLET BY MOUTH EVERY DAY 90 tablet 0  . LUTEIN PO Take 1 tablet by mouth daily.    . Magnesium Citrate 100 MG TABS Take 100 mg by mouth daily.     . Multiple Vitamins-Minerals (MACULAR VITAMIN BENEFIT PO) Take 2 tablets by mouth daily.    . Multiple Vitamins-Minerals (MULTIVITAMIN & MINERAL PO) Take 1 tablet by mouth daily.    . Rivaroxaban (XARELTO) 15 MG TABS tablet Take 1 tablet (15 mg total) by mouth daily with supper. 90 tablet 3  .  Thiamine HCl (B-1 PO) Take 1 tablet by mouth daily.     . timolol (BETIMOL) 0.5 % ophthalmic solution Place 1 drop into both eyes daily.    . vitamin B-12 (CYANOCOBALAMIN) 1000 MCG tablet Take 1,000 mcg by mouth every other day.      No current facility-administered medications on file prior to visit.    Review of Systems  Constitutional: Negative for other unusual diaphoresis or sweats HENT: Negative for ear discharge or swelling Eyes: Negative for other worsening visual disturbances Respiratory: Negative for stridor or other swelling  Gastrointestinal: Negative for worsening distension or other blood Genitourinary: Negative for retention or other urinary change Musculoskeletal: Negative for other MSK pain or swelling Skin: Negative for color change or other new lesions Neurological: Negative for worsening tremors and other numbness    Psychiatric/Behavioral: Negative for worsening agitation or other fatigue All other system neg per pt    Objective:   Physical Exam BP 130/84   Pulse 63   Ht 5\' 2"  (1.575 m)   Wt 128 lb (58.1 kg)   SpO2 99%   BMI 23.41 kg/m  VS noted,  Constitutional: Pt appears in NAD HENT: Head: NCAT.  Right Ear: External ear normal.  Left Ear: External ear normal.  Eyes: . Pupils are equal, round, and reactive to light. Conjunctivae and EOM are normal Nose: without d/c or deformity Neck: Neck supple. Gross normal ROM Cardiovascular: Normal rate and regular rhythm.   Pulmonary/Chest: Effort normal and breath sounds without rales or wheezing.  Abd:  Soft, NT, ND, + BS, no organomegaly Neurological: Pt is alert. At baseline orientation, motor grossly intact Skin: Skin is warm. No rashes, other new lesions, no LE edema Psychiatric: Pt behavior is normal without agitation .not depressed affect No other exam findings Lab Results  Component Value Date   WBC 4.1 03/21/2016   HGB 12.9 03/21/2016   HCT 37.3 03/21/2016   PLT 192.0 03/21/2016   GLUCOSE 98 03/21/2016   CHOL 171 03/21/2016   TRIG 90.0 03/21/2016   HDL 80.10 03/21/2016   LDLCALC 73 03/21/2016   ALT 15 03/21/2016   AST 26 03/21/2016   NA 140 03/21/2016   K 4.4 03/21/2016   CL 103 03/21/2016   CREATININE 0.71 03/21/2016   BUN 20 03/21/2016   CO2 31 03/21/2016   TSH 1.37 03/21/2016   INR 1.09 01/24/2013      Assessment & Plan:

## 2016-09-19 ENCOUNTER — Ambulatory Visit: Payer: PPO | Admitting: Internal Medicine

## 2016-09-21 NOTE — Assessment & Plan Note (Signed)
stable overall by history and exam, and pt to continue medical treatment as before,  to f/u any worsening symptoms or concerns 

## 2016-09-21 NOTE — Assessment & Plan Note (Signed)
stable overall by history and exam, recent data reviewed with pt, and pt to continue medical treatment as before,  to f/u any worsening symptoms or concerns BP Readings from Last 3 Encounters:  09/18/16 130/84  06/26/16 134/74  05/26/16 132/74

## 2016-09-29 ENCOUNTER — Encounter: Payer: Self-pay | Admitting: Cardiology

## 2016-10-10 NOTE — Progress Notes (Signed)
HPI: FU atrial fibrillation. Monitor in August of 2014 showed PAF. Toprol DCed previously due to bradycardia. Echocardiogram in November 2015 showed normal LV function, grade 1 diastolic dysfunction, mild mitral regurgitation with systolic prolapse of anterior mitral valve leaflet. Since I last saw her, she denies dyspnea, exertional chest pain or syncope. No palpitations. No bleeding.  Current Outpatient Prescriptions  Medication Sig Dispense Refill  . ALPRAZolam (XANAX) 0.5 MG tablet 1/2 - 1 tab by mouth at bedtime as needed 30 tablet 5  . b complex vitamins tablet Take 1 tablet by mouth daily.    . Biotin 1 MG CAPS Take 1 tablet by mouth daily.    . Calcium Carbonate-Vitamin D (CALCIUM + D PO) Take 1 tablet by mouth daily.     . Coenzyme Q10 (COQ-10) 100 MG CAPS Take 100 mg by mouth daily.     Marland Kitchen gabapentin (NEURONTIN) 100 MG capsule Take 1 capsule (100 mg total) by mouth 3 (three) times daily. 90 capsule 5  . glucosamine-chondroitin 500-400 MG tablet Take 2 tablets by mouth daily.    . IRON, FERROUS GLUCONATE, PO Take 1 tablet by mouth every other day.    . losartan (COZAAR) 100 MG tablet TAKE 1 TABLET BY MOUTH EVERY DAY 90 tablet 0  . LUTEIN PO Take 1 tablet by mouth daily.    . Magnesium Citrate 100 MG TABS Take 100 mg by mouth daily.     . Multiple Vitamins-Minerals (MACULAR VITAMIN BENEFIT PO) Take 2 tablets by mouth daily.    . Multiple Vitamins-Minerals (MULTIVITAMIN & MINERAL PO) Take 1 tablet by mouth daily.    . Rivaroxaban (XARELTO) 15 MG TABS tablet Take 1 tablet (15 mg total) by mouth daily with supper. 90 tablet 3  . Thiamine HCl (B-1 PO) Take 1 tablet by mouth daily.     . timolol (BETIMOL) 0.5 % ophthalmic solution Place 1 drop into both eyes daily.    . vitamin B-12 (CYANOCOBALAMIN) 1000 MCG tablet Take 1,000 mcg by mouth every other day.      No current facility-administered medications for this visit.      Past Medical History:  Diagnosis Date  . ALLERGIC  RHINITIS   . ANXIETY   . ASTHMA   . Atrial fibrillation (HCC)   . BACK PAIN   . Carotid stenosis 04/27/2012   Mild left on community screening  - Nov 2013  . CATARACT NOS   . DEPRESSION   . GLAUCOMA NOS   . HTN (hypertension)   . Hypercalcemia   . MITRAL VALVE PROLAPSE   . OSTEOPOROSIS   . PERIPHERAL NEUROPATHY   . POSITIONAL VERTIGO   . Rosacea     Past Surgical History:  Procedure Laterality Date  .  ankle surgury     left ankle  . CATARACT EXTRACTION, BILATERAL    . HEMORRHOID SURGERY    . posterior chamber interocular lens implant      Social History   Social History  . Marital status: Widowed    Spouse name: N/A  . Number of children: N/A  . Years of education: N/A   Occupational History  . retired [employer] Retired   Social History Main Topics  . Smoking status: Never Smoker  . Smokeless tobacco: Never Used  . Alcohol use No  . Drug use: No  . Sexual activity: Not on file   Other Topics Concern  . Not on file   Social History Narrative  .  No narrative on file    Family History  Problem Relation Age of Onset  . CAD Mother        MI at age 30  . Cancer Sister        colon  . Cancer Brother        prostate cancer    ROS: no fevers or chills, productive cough, hemoptysis, dysphasia, odynophagia, melena, hematochezia, dysuria, hematuria, rash, seizure activity, orthopnea, PND, pedal edema, claudication. Remaining systems are negative.  Physical Exam: Well-developed frail in no acute distress.  Skin is warm and dry.  HEENT is normal.  Neck is supple.  Chest is clear to auscultation with normal expansion.  Cardiovascular exam is regular rate and rhythm. 2/6 systolic murmur at the apex Abdominal exam nontender or distended. No masses palpated. Extremities show no edema. neuro grossly intact  ECG-Sinus bradycardia at a rate of 51. Cannot rule out prior septal infarct. personally reviewed  A/P  1 Paroxysmal atrial fibrillation-patient remains  in sinus rhythm today. Continue xarelto; Check hemoglobin and renal function.  2 hypertension-blood pressure is controlled. Continue present medications. Check potassium and renal function.  3 mitral regurgitation-mitral regurgitation is mild on most recent echocardiogram.  Olga Millers, MD

## 2016-10-14 ENCOUNTER — Ambulatory Visit (INDEPENDENT_AMBULATORY_CARE_PROVIDER_SITE_OTHER): Payer: PPO | Admitting: Cardiology

## 2016-10-14 ENCOUNTER — Encounter: Payer: Self-pay | Admitting: Cardiology

## 2016-10-14 VITALS — BP 130/78 | HR 51 | Ht 62.0 in | Wt 126.0 lb

## 2016-10-14 DIAGNOSIS — I059 Rheumatic mitral valve disease, unspecified: Secondary | ICD-10-CM

## 2016-10-14 DIAGNOSIS — I10 Essential (primary) hypertension: Secondary | ICD-10-CM

## 2016-10-14 DIAGNOSIS — I480 Paroxysmal atrial fibrillation: Secondary | ICD-10-CM

## 2016-10-14 NOTE — Patient Instructions (Addendum)

## 2016-10-15 LAB — BASIC METABOLIC PANEL
BUN / CREAT RATIO: 21 (ref 12–28)
BUN: 15 mg/dL (ref 8–27)
CHLORIDE: 105 mmol/L (ref 96–106)
CO2: 25 mmol/L (ref 20–29)
CREATININE: 0.7 mg/dL (ref 0.57–1.00)
Calcium: 9.7 mg/dL (ref 8.7–10.3)
GFR calc non Af Amer: 78 mL/min/{1.73_m2} (ref >59)
GFR, EST AFRICAN AMERICAN: 89 mL/min/{1.73_m2} (ref >59)
GLUCOSE: 94 mg/dL (ref 65–99)
Potassium: 4.4 mmol/L (ref 3.5–5.2)
SODIUM: 144 mmol/L (ref 134–144)

## 2016-10-15 LAB — CBC
Hematocrit: 39.3 % (ref 34.0–46.6)
Hemoglobin: 13.1 g/dL (ref 11.1–15.9)
MCH: 32.2 pg (ref 26.6–33.0)
MCHC: 33.3 g/dL (ref 31.5–35.7)
MCV: 97 fL (ref 79–97)
PLATELETS: 212 10*3/uL (ref 150–379)
RBC: 4.07 x10E6/uL (ref 3.77–5.28)
RDW: 13.8 % (ref 12.3–15.4)
WBC: 3.3 10*3/uL — ABNORMAL LOW (ref 3.4–10.8)

## 2016-10-16 ENCOUNTER — Other Ambulatory Visit: Payer: Self-pay | Admitting: Cardiology

## 2016-10-16 DIAGNOSIS — I10 Essential (primary) hypertension: Secondary | ICD-10-CM

## 2016-11-25 DIAGNOSIS — L7211 Pilar cyst: Secondary | ICD-10-CM | POA: Diagnosis not present

## 2016-12-25 ENCOUNTER — Telehealth: Payer: Self-pay | Admitting: Cardiology

## 2016-12-25 MED ORDER — RIVAROXABAN 15 MG PO TABS: 15.0000 mg | ORAL_TABLET | Freq: Every day | ORAL | 6 refills | Status: DC

## 2016-12-25 NOTE — Telephone Encounter (Signed)
Rx has been sent to the pharmacy electronically. ° °

## 2016-12-25 NOTE — Telephone Encounter (Signed)
° ° °  Pt c/o medication issue:  1. Name of Medication: xarelto  2. How are you currently taking this medication (dosage and times per day)? 15mg  daily   3. Are you having a reaction (difficulty breathing--STAT)? no  4. What is your medication issue?   She want the medication xarelto 15 changed to 30 days because it too costly

## 2017-01-08 ENCOUNTER — Other Ambulatory Visit: Payer: Self-pay | Admitting: Cardiology

## 2017-01-08 NOTE — Telephone Encounter (Signed)
Pt notified [facility] thanks

## 2017-01-08 NOTE — Telephone Encounter (Signed)
Samples at the front desk 

## 2017-01-08 NOTE — Telephone Encounter (Signed)
Patient calling the office for samples of medication: ° ° °1.  What medication and dosage are you requesting samples for?Xarelto °2.  Are you currently out of this medication? Pt is in the donut hole ° ° ° °

## 2017-01-22 ENCOUNTER — Other Ambulatory Visit: Payer: Self-pay | Admitting: Internal Medicine

## 2017-01-22 DIAGNOSIS — I10 Essential (primary) hypertension: Secondary | ICD-10-CM

## 2017-01-22 MED ORDER — LOSARTAN POTASSIUM 100 MG PO TABS: 100.0000 mg | ORAL_TABLET | Freq: Every day | ORAL | 0 refills | Status: DC

## 2017-01-22 NOTE — Telephone Encounter (Signed)
Medication refill request for Losartan. / LOV 09/18/16 with Dr. Jonny Ruiz / Sent electronically to [facility] on [address]

## 2017-01-22 NOTE — Telephone Encounter (Signed)
Copied from CRM [id number]. Topic: General - Other >> Jan 22, 2017  3:55 PM Gerrianne Scale wrote: Reason for CRM: med refill on Losartan 100mg  please send it to the [facility] on [address]

## 2017-02-25 ENCOUNTER — Other Ambulatory Visit: Payer: Self-pay | Admitting: Cardiology

## 2017-02-25 MED ORDER — RIVAROXABAN 15 MG PO TABS: 15.0000 mg | ORAL_TABLET | Freq: Every day | ORAL | 1 refills | Status: DC

## 2017-02-25 NOTE — Telephone Encounter (Signed)
°*  STAT* If patient is at the pharmacy, call can be transferred to refill team.   1. Which medications need to be refilled? (please list name of each medication and dose if known) Xarelto  2. Which pharmacy/location (including street and city if local pharmacy) is medication to be sent to?[facility] [address],[place], 3. Do they need a 30 day or 90 day supply? 90 and refills

## 2017-03-05 DIAGNOSIS — L7211 Pilar cyst: Secondary | ICD-10-CM | POA: Diagnosis not present

## 2017-03-11 NOTE — Progress Notes (Addendum)
Subjective:   Ann Booth is a 84 y.o. female who presents for Medicare Annual (Subsequent) preventive examination.  Review of Systems:  No ROS.  Medicare Wellness Visit. Additional risk factors are reflected in the social history.  Cardiac Risk Factors include: advanced age (>76men, >66 women);hypertension Sleep patterns: feels rested on waking, gets up 1-2 times nightly to void and sleeps 5 hours nightly. Patient reports insomnia issues, discussed recommended sleep tips.   Home Safety/Smoke Alarms: Feels safe in home. Smoke alarms in place.  Living environment; residence and Firearm Safety: 1-story house/ trailer, equipment: Medical laboratory scientific officer, Type: Single [employer], no firearms. Lives alone, no needs for DME, limited support system Seat Belt Safety/Bike Helmet: Wears seat belt.     Objective:     Vitals: BP 136/74   Pulse 78   Resp 20   Ht 5\' 2"  (1.575 m)   Wt 130 lb (59 kg)   SpO2 97%   BMI 23.78 kg/m   Body mass index is 23.78 kg/m.  Advanced Directives 03/12/2017  Does Patient Have a Medical Advance Directive? No  Would patient like information on creating a medical advance directive? Yes (ED - Information included in AVS)    Tobacco Social History   Tobacco Use  Smoking Status Never Smoker  Smokeless Tobacco Never Used     Counseling given: Not Answered  Past Medical History:  Diagnosis Date  . ALLERGIC RHINITIS   . ANXIETY   . ASTHMA   . Atrial fibrillation (HCC)   . BACK PAIN   . Carotid stenosis 04/27/2012   Mild left on community screening  - Nov 2013  . CATARACT NOS   . DEPRESSION   . GLAUCOMA NOS   . HTN (hypertension)   . Hypercalcemia   . MITRAL VALVE PROLAPSE   . OSTEOPOROSIS   . PERIPHERAL NEUROPATHY   . POSITIONAL VERTIGO   . Rosacea    Past Surgical History:  Procedure Laterality Date  .  ankle surgury     left ankle  . CATARACT EXTRACTION, BILATERAL    . HEMORRHOID SURGERY    . posterior chamber interocular lens implant     Family  History  Problem Relation Age of Onset  . CAD Mother        MI at age 60  . Cancer Sister        colon  . Cancer Brother        prostate cancer   Social History   Socioeconomic History  . Marital status: Widowed    Spouse name: None  . Number of children: 0  . Years of education: None  . Highest education level: None  Social Needs  . Financial resource strain: Not hard at all  . Food insecurity - worry: Never true  . Food insecurity - inability: Never true  . Transportation needs - medical: No  . Transportation needs - non-medical: No  Occupational History  . Occupation: retired Control and instrumentation engineer: RETIRED  Tobacco Use  . Smoking status: Never Smoker  . Smokeless tobacco: Never Used  Substance and Sexual Activity  . Alcohol use: No  . Drug use: No  . Sexual activity: Not Currently  Other Topics Concern  . None  Social History Narrative  . None    Outpatient Encounter Medications as of 03/12/2017  Medication Sig  . ALPRAZolam (XANAX) 0.5 MG tablet 1/2 - 1 tab by mouth at bedtime as needed  . b complex vitamins tablet Take 1  tablet by mouth daily.  . Biotin 1 MG CAPS Take 1 tablet by mouth daily.  . Calcium Carbonate-Vitamin D (CALCIUM + D PO) Take 1 tablet by mouth daily.   . Coenzyme Q10 (COQ-10) 100 MG CAPS Take 100 mg by mouth daily.   Marland Kitchen glucosamine-chondroitin 500-400 MG tablet Take 1 tablet by mouth 2 (two) times daily.  . IRON, FERROUS GLUCONATE, PO Take 1 tablet by mouth 2 (two) times a week.   . losartan (COZAAR) 100 MG tablet Take 1 tablet (100 mg total) by mouth daily.  . LUTEIN PO Take 1 tablet by mouth daily.  . Magnesium Citrate 100 MG TABS Take 100 mg by mouth daily.   . Multiple Vitamins-Minerals (MACULAR VITAMIN BENEFIT PO) Take 2 tablets by mouth daily.  . Multiple Vitamins-Minerals (MULTIVITAMIN & MINERAL PO) Take 1 tablet by mouth daily.  . Rivaroxaban (XARELTO) 15 MG TABS tablet Take 1 tablet (15 mg total) by mouth daily with supper.  .  Thiamine HCl (B-1 PO) Take 1 tablet by mouth daily.   . timolol (BETIMOL) 0.5 % ophthalmic solution Place 1 drop into both eyes daily.  . vitamin B-12 (CYANOCOBALAMIN) 1000 MCG tablet Take 1,000 mcg by mouth every other day.   . [DISCONTINUED] gabapentin (NEURONTIN) 100 MG capsule Take 1 capsule (100 mg total) by mouth 3 (three) times daily. (Patient not taking: Reported on 03/12/2017)  . [DISCONTINUED] glucosamine-chondroitin 500-400 MG tablet Take 2 tablets by mouth daily.   No facility-administered encounter medications on file as of 03/12/2017.     Activities of Daily Living In your present state of health, do you have any difficulty performing the following activities: 03/12/2017  Hearing? N  Vision? N  Difficulty concentrating or making decisions? Y  Walking or climbing stairs? Y  Dressing or bathing? N  Doing errands, shopping? N  Preparing Food and eating ? N  Using the Toilet? N  In the past six months, have you accidently leaked urine? Y  Do you have problems with loss of bowel control? N  Managing your Medications? N  Managing your Finances? N  Housekeeping or managing your Housekeeping? N  Some recent data might be hidden    Patient Care Team: Corwin Levins, MD as PCP - General Jens Som Madolyn Frieze, MD as Consulting Physician (Cardiology) Bond, Doran Stabler, MD as Referring Physician (Ophthalmology)    Assessment:   This is a routine wellness examination for Ann Booth. Physical assessment deferred to PCP.   Exercise Activities and Dietary recommendations Current Exercise Habits: The patient does not participate in regular exercise at present(provided chair exercise pamphlets), Exercise limited by: orthopedic condition(s)  Diet (meal preparation, eat out, water intake, caffeinated beverages, dairy products, fruits and vegetables): in general, an "unhealthy" diet, on average, 2 meals per day, on average, 3-4 fast food meals per week   Reviewed heart healthy diet, encouraged  patient to increase daily water intake. Discussed using nutritional supplements instead of skipping meals.  Diet education was attached to patient's AVS. Relevant patient education assigned to patient using Emmi.  Goals    . Patient Stated     Increase physical activity by walking with my neighbors when they walk, check into [facility] and [employer]. Go bed a little earlier, do eye drops at 10:00 and 10:00.        Fall Risk Fall Risk  03/12/2017 06/26/2016 03/08/2015 08/08/2014 10/08/2012  Falls in the past year? Yes No No No No  Number falls in past yr: 1 - - - -  Follow up Falls prevention discussed;Education provided - - - -    Depression Screen PHQ 2/9 Scores 03/12/2017 06/26/2016 03/08/2015 08/08/2014  PHQ - 2 Score 2 0 1 1  PHQ- 9 Score 7 - - -     Cognitive Function MMSE - Mini Mental State Exam 03/12/2017  Orientation to time 5  Orientation to Place 5  Registration 3  Attention/ Calculation 5  Recall 3  Language- name 2 objects 2  Language- repeat 1  Language- follow 3 step command 3  Language- read & follow direction 1  Write a sentence 1  Copy design 1  Total score 30        Immunization History  Administered Date(s) Administered  . Influenza Split 11/21/2010, 10/31/2011  . Influenza, High Dose Seasonal PF 11/05/2012  . Influenza,inj,Quad PF,6+ Mos 10/12/2013, 12/07/2014  . Influenza-Unspecified 11/25/2016  . Pneumococcal Conjugate-13 07/28/2013  . Pneumococcal Polysaccharide-23 03/04/2006  . Td 01/29/2010  . Zoster 02/07/2009, 02/12/2009   Screening Tests Health Maintenance  Topic Date Due  . TETANUS/TDAP  01/30/2020  . INFLUENZA VACCINE  Completed  . DEXA SCAN  Completed  . PNA vac Low Risk Adult  Completed      Plan:     Start doing brain stimulating activities (puzzles, reading, adult coloring books, staying active) to keep memory sharp.   Start to eat heart healthy diet (full of fruits, vegetables, whole grains, lean protein, water--limit  salt, fat, and sugar intake) and increase physical activity as tolerated.   I have personally reviewed and noted the following in the patient's chart:   . Medical and social history . Use of alcohol, tobacco or illicit drugs  . Current medications and supplements . Functional ability and status . Nutritional status . Physical activity . Advanced directives . List of other physicians . Vitals . Screenings to include cognitive, depression, and falls . Referrals and appointments  In addition, I have reviewed and discussed with patient certain preventive protocols, quality metrics, and best practice recommendations. A written personalized care plan for preventive services as well as general preventive health recommendations were provided to patient.     Wanda Plump, RN  03/12/2017    Medical screening examination/treatment/procedure(s) were performed by non-physician practitioner and as supervising physician I was immediately available for consultation/collaboration. I agree with above. Oliver Barre, MD

## 2017-03-12 ENCOUNTER — Ambulatory Visit (INDEPENDENT_AMBULATORY_CARE_PROVIDER_SITE_OTHER): Payer: PPO | Admitting: *Deleted

## 2017-03-12 VITALS — BP 136/74 | HR 78 | Resp 20 | Ht 62.0 in | Wt 130.0 lb

## 2017-03-12 DIAGNOSIS — Z0000 Encounter for general adult medical examination without abnormal findings: Secondary | ICD-10-CM

## 2017-03-12 NOTE — Patient Instructions (Addendum)
Start doing brain stimulating activities (puzzles, reading, adult coloring books, staying active) to keep memory sharp.   Start to eat heart healthy diet (full of fruits, vegetables, whole grains, lean protein, water--limit salt, fat, and sugar intake) and increase physical activity as tolerated.   Lifeline: http://www.lifelinesys.com/content/home; 640-692-5950 [phone]   Look for tub bench in your home and call if you want [place] to order tub bench for you. [phone]  Ann Booth , Thank you for taking time to come for your Medicare Wellness Visit. I appreciate your ongoing commitment to your health goals. Please review the following plan we discussed and let me know if I can assist you in the future.   These are the goals we discussed: Goals    . Patient Stated     Increase physical activity by walking with my neighbors when they walk, check into [facility] and [employer]. Go bed a little earlier, do eye drops at 10:00 and 10:00.        This is a list of the screening recommended for you and due dates:  Health Maintenance  Topic Date Due  . Tetanus Vaccine  01/30/2020  . Flu Shot  Completed  . DEXA scan (bone density measurement)  Completed  . Pneumonia vaccines  Completed   It is important to avoid accidents which may result in broken bones.  Here are a few ideas on how to make your home safer so you will be less likely to trip or fall.  1. Use nonskid mats or non slip strips in your shower or tub, on your bathroom floor and around sinks.  If you know that you have spilled water, wipe it up! 2. In the bathroom, it is important to have properly installed grab bars on the walls or on the edge of the tub.  Towel racks are NOT strong enough for you to hold onto or to pull on for support. 3. Stairs and hallways should have enough light.  Add lamps or night lights if you need ore light. 4. It is good to have handrails on both sides of the stairs if possible.  Always fix broken  handrails right away. 5. It is important to see the edges of steps.  Paint the edges of outdoor steps white so you can see them better.  Put colored tape on the edge of inside steps. 6. Throw-rugs are dangerous because they can slide.  Removing the rugs is the best idea, but if they must stay, add adhesive carpet tape to prevent slipping. 7. Do not keep things on stairs or in the halls.  Remove small furniture that blocks the halls as it may cause you to trip.  Keep telephone and electrical cords out of the way where you walk. 8. Always were sturdy, rubber-soled shoes for good support.  Never wear just socks, especially on the stairs.  Socks may cause you to slip or fall.  Do not wear full-length housecoats as you can easily trip on the bottom.  9. Place the things you use the most on the shelves that are the easiest to reach.  If you use a stepstool, make sure it is in good condition.  If you feel unsteady, DO NOT climb, ask for help. 10. If a health professional advises you to use a cane or walker, do not be ashamed.  These items can keep you from falling and breaking your bones.   Insomnia Insomnia is a sleep disorder that makes it difficult to fall asleep  or to stay asleep. Insomnia can cause tiredness (fatigue), low energy, difficulty concentrating, mood swings, and poor performance at work or school. There are three different ways to classify insomnia:  Difficulty falling asleep.  Difficulty staying asleep.  Waking up too early in the morning.  Any type of insomnia can be long-term (chronic) or short-term (acute). Both are common. Short-term insomnia usually lasts for three months or less. Chronic insomnia occurs at least three times a week for longer than three months. What are the causes? Insomnia may be caused by another condition, situation, or substance, such as:  Anxiety.  Certain medicines.  Gastroesophageal reflux disease (GERD) or other gastrointestinal conditions.  Asthma  or other breathing conditions.  Restless legs syndrome, sleep apnea, or other sleep disorders.  Chronic pain.  Menopause. This may include hot flashes.  Stroke.  Abuse of alcohol, tobacco, or illegal drugs.  Depression.  Caffeine.  Neurological disorders, such as Alzheimer disease.  An overactive thyroid (hyperthyroidism).  The cause of insomnia may not be known. What increases the risk? Risk factors for insomnia include:  Gender. Women are more commonly affected than men.  Age. Insomnia is more common as you get older.  Stress. This may involve your professional or personal life.  Income. Insomnia is more common in people with lower income.  Lack of exercise.  Irregular work schedule or night shifts.  Traveling between different time zones.  What are the signs or symptoms? If you have insomnia, trouble falling asleep or trouble staying asleep is the main symptom. This may lead to other symptoms, such as:  Feeling fatigued.  Feeling nervous about going to sleep.  Not feeling rested in the morning.  Having trouble concentrating.  Feeling irritable, anxious, or depressed.  How is this treated? Treatment for insomnia depends on the cause. If your insomnia is caused by an underlying condition, treatment will focus on addressing the condition. Treatment may also include:  Medicines to help you sleep.  Counseling or therapy.  Lifestyle adjustments.  Follow these instructions at home:  Take medicines only as directed by your health care provider.  Keep regular sleeping and waking hours. Avoid naps.  Keep a sleep diary to help you and your health care provider figure out what could be causing your insomnia. Include: ? When you sleep. ? When you wake up during the night. ? How well you sleep. ? How rested you feel the next day. ? Any side effects of medicines you are taking. ? What you eat and drink.  Make your bedroom a comfortable place where it is  easy to fall asleep: ? Put up shades or special blackout curtains to block light from outside. ? Use a white noise machine to block noise. ? Keep the temperature cool.  Exercise regularly as directed by your health care provider. Avoid exercising right before bedtime.  Use relaxation techniques to manage stress. Ask your health care provider to suggest some techniques that may work well for you. These may include: ? Breathing exercises. ? Routines to release muscle tension. ? Visualizing peaceful scenes.  Cut back on alcohol, caffeinated beverages, and cigarettes, especially close to bedtime. These can disrupt your sleep.  Do not overeat or eat spicy foods right before bedtime. This can lead to digestive discomfort that can make it hard for you to sleep.  Limit screen use before bedtime. This includes: ? Watching TV. ? Using your smartphone, tablet, and computer.  Stick to a routine. This can help you fall asleep  faster. Try to do a quiet activity, brush your teeth, and go to bed at the same time each night.  Get out of bed if you are still awake after 15 minutes of trying to sleep. Keep the lights down, but try reading or doing a quiet activity. When you feel sleepy, go back to bed.  Make sure that you drive carefully. Avoid driving if you feel very sleepy.  Keep all follow-up appointments as directed by your health care provider. This is important. Contact a health care provider if:  You are tired throughout the day or have trouble in your daily routine due to sleepiness.  You continue to have sleep problems or your sleep problems get worse. Get help right away if:  You have serious thoughts about hurting yourself or someone else. This information is not intended to replace advice given to you by your health care provider. Make sure you discuss any questions you have with your health care provider. Document Released: 02/08/2000 Document Revised: 07/13/2015 Document Reviewed:  11/11/2013 Elsevier Interactive Patient Education  2018 [employer].  Fat and Cholesterol Restricted Diet Getting too much fat and cholesterol in your diet may cause health problems. Following this diet helps keep your fat and cholesterol at normal levels. This can keep you from getting sick. What types of fat should I choose?  Choose monosaturated and polyunsaturated fats. These are found in foods such as olive oil, canola oil, flaxseeds, walnuts, almonds, and seeds.  Eat more omega-3 fats. Good choices include salmon, mackerel, sardines, tuna, flaxseed oil, and ground flaxseeds.  Limit saturated fats. These are in animal products such as meats, butter, and cream. They can also be in plant products such as palm oil, palm kernel oil, and coconut oil.  Avoid foods with partially hydrogenated oils in them. These contain trans fats. Examples of foods that have trans fats are stick margarine, some tub margarines, cookies, crackers, and other baked goods. What general guidelines do I need to follow?  Check food labels. Look for the words "trans fat" and "saturated fat."  When preparing a meal: ? Fill half of your plate with vegetables and green salads. ? Fill one fourth of your plate with whole grains. Look for the word "whole" as the first word in the ingredient list. ? Fill one fourth of your plate with lean protein foods.  Eat more foods that have fiber, like apples, carrots, beans, peas, and barley.  Eat more home-cooked foods. Eat less at restaurants and buffets.  Limit or avoid alcohol.  Limit foods high in starch and sugar.  Limit fried foods.  Cook foods without frying them. Baking, boiling, grilling, and broiling are all great options.  Lose weight if you are overweight. Losing even a small amount of weight can help your overall health. It can also help prevent diseases such as diabetes and heart disease. What foods can I eat? Grains Whole grains, such as whole wheat or  whole grain breads, crackers, cereals, and pasta. Unsweetened oatmeal, bulgur, barley, quinoa, or brown rice. Corn or whole wheat flour tortillas. Vegetables Fresh or frozen vegetables (raw, steamed, roasted, or grilled). Green salads. Fruits All fresh, canned (in natural juice), or frozen fruits. Meat and Other Protein Products Ground beef (85% or leaner), grass-fed beef, or beef trimmed of fat. Skinless chicken or [place]. Ground chicken or [place]. Pork trimmed of fat. All fish and seafood. Eggs. Dried beans, peas, or lentils. Unsalted nuts or seeds. Unsalted canned or dry beans. Dairy Low-fat dairy  products, such as skim or 1% milk, 2% or reduced-fat cheeses, low-fat ricotta or cottage cheese, or plain low-fat yogurt. Fats and Oils Tub margarines without trans fats. Light or reduced-fat mayonnaise and salad dressings. Avocado. Olive, canola, sesame, or safflower oils. Natural peanut or almond butter (choose ones without added sugar and oil). The items listed above may not be a complete list of recommended foods or beverages. Contact your dietitian for more options. What foods are not recommended? Grains White bread. White pasta. White rice. Cornbread. Bagels, pastries, and croissants. Crackers that contain trans fat. Vegetables White potatoes. Corn. Creamed or fried vegetables. Vegetables in a cheese sauce. Fruits Dried fruits. Canned fruit in light or heavy syrup. Fruit juice. Meat and Other Protein Products Fatty cuts of meat. Ribs, chicken wings, bacon, sausage, bologna, salami, chitterlings, fatback, hot dogs, bratwurst, and packaged luncheon meats. Liver and organ meats. Dairy Whole or 2% milk, cream, half-and-half, and cream cheese. Whole milk cheeses. Whole-fat or sweetened yogurt. Full-fat cheeses. Nondairy creamers and whipped toppings. Processed cheese, cheese spreads, or cheese curds. Sweets and Desserts Corn syrup, sugars, honey, and molasses. Candy. Jam and jelly. Syrup.  Sweetened cereals. Cookies, pies, cakes, donuts, muffins, and ice cream. Fats and Oils Butter, stick margarine, lard, shortening, ghee, or bacon fat. Coconut, palm kernel, or palm oils. Beverages Alcohol. Sweetened drinks (such as sodas, lemonade, and fruit drinks or punches). The items listed above may not be a complete list of foods and beverages to avoid. Contact your dietitian for more information. This information is not intended to replace advice given to you by your health care provider. Make sure you discuss any questions you have with your health care provider. Document Released: 08/12/2011 Document Revised: 10/18/2015 Document Reviewed: 05/12/2013 Elsevier Interactive Patient Education  [employer].

## 2017-03-16 ENCOUNTER — Ambulatory Visit (INDEPENDENT_AMBULATORY_CARE_PROVIDER_SITE_OTHER): Payer: PPO | Admitting: Internal Medicine

## 2017-03-16 ENCOUNTER — Other Ambulatory Visit (INDEPENDENT_AMBULATORY_CARE_PROVIDER_SITE_OTHER): Payer: PPO

## 2017-03-16 ENCOUNTER — Encounter: Payer: Self-pay | Admitting: Internal Medicine

## 2017-03-16 ENCOUNTER — Ambulatory Visit (INDEPENDENT_AMBULATORY_CARE_PROVIDER_SITE_OTHER)
Admission: RE | Admit: 2017-03-16 | Discharge: 2017-03-16 | Disposition: A | Discharge status: Home / Self Care | Payer: PPO | Source: Ambulatory Visit | Attending: Internal Medicine | Admitting: Internal Medicine

## 2017-03-16 VITALS — BP 124/82 | HR 64 | Temp 97.6°F | Ht 62.0 in | Wt 130.0 lb

## 2017-03-16 DIAGNOSIS — M25562 Pain in left knee: Secondary | ICD-10-CM

## 2017-03-16 DIAGNOSIS — Z0000 Encounter for general adult medical examination without abnormal findings: Secondary | ICD-10-CM | POA: Diagnosis not present

## 2017-03-16 DIAGNOSIS — M545 Low back pain: Secondary | ICD-10-CM | POA: Diagnosis not present

## 2017-03-16 DIAGNOSIS — R079 Chest pain, unspecified: Secondary | ICD-10-CM | POA: Diagnosis not present

## 2017-03-16 DIAGNOSIS — G8929 Other chronic pain: Secondary | ICD-10-CM | POA: Diagnosis not present

## 2017-03-16 DIAGNOSIS — I10 Essential (primary) hypertension: Secondary | ICD-10-CM

## 2017-03-16 DIAGNOSIS — F411 Generalized anxiety disorder: Secondary | ICD-10-CM | POA: Diagnosis not present

## 2017-03-16 DIAGNOSIS — Z0001 Encounter for general adult medical examination with abnormal findings: Secondary | ICD-10-CM

## 2017-03-16 LAB — CBC WITH DIFFERENTIAL/PLATELET
BASOS ABS: 0 10*3/uL (ref 0.0–0.1)
Basophils Relative: 0.9 % (ref 0.0–3.0)
EOS ABS: 0.1 10*3/uL (ref 0.0–0.7)
Eosinophils Relative: 3.2 % (ref 0.0–5.0)
HEMATOCRIT: 40 % (ref 36.0–46.0)
HEMOGLOBIN: 13.4 g/dL (ref 12.0–15.0)
LYMPHS PCT: 30.7 % (ref 12.0–46.0)
Lymphs Abs: 1.4 10*3/uL (ref 0.7–4.0)
MCHC: 33.5 g/dL (ref 30.0–36.0)
MCV: 96.9 fl (ref 78.0–100.0)
Monocytes Absolute: 0.5 10*3/uL (ref 0.1–1.0)
Monocytes Relative: 10.4 % (ref 3.0–12.0)
Neutro Abs: 2.5 10*3/uL (ref 1.4–7.7)
Neutrophils Relative %: 54.8 % (ref 43.0–77.0)
Platelets: 188 10*3/uL (ref 150.0–400.0)
RBC: 4.12 Mil/uL (ref 3.87–5.11)
RDW: 13.7 % (ref 11.5–15.5)
WBC: 4.6 10*3/uL (ref 4.0–10.5)

## 2017-03-16 LAB — HEPATIC FUNCTION PANEL
ALK PHOS: 62 U/L (ref 39–117)
ALT: 16 U/L (ref 0–35)
AST: 26 U/L (ref 0–37)
Albumin: 4.2 g/dL (ref 3.5–5.2)
BILIRUBIN DIRECT: 0.1 mg/dL (ref 0.0–0.3)
TOTAL PROTEIN: 7.3 g/dL (ref 6.0–8.3)
Total Bilirubin: 0.5 mg/dL (ref 0.2–1.2)

## 2017-03-16 LAB — BASIC METABOLIC PANEL
BUN: 19 mg/dL (ref 6–23)
CHLORIDE: 100 meq/L (ref 96–112)
CO2: 33 mEq/L — ABNORMAL HIGH (ref 19–32)
CREATININE: 0.72 mg/dL (ref 0.40–1.20)
Calcium: 10.4 mg/dL (ref 8.4–10.5)
GFR: 81.06 mL/min (ref >60.00)
GLUCOSE: 95 mg/dL (ref 70–99)
Potassium: 4.6 mEq/L (ref 3.5–5.1)
Sodium: 139 mEq/L (ref 135–145)

## 2017-03-16 LAB — URINALYSIS, ROUTINE W REFLEX MICROSCOPIC
Bilirubin Urine: NEGATIVE
HGB URINE DIPSTICK: NEGATIVE
Ketones, ur: NEGATIVE
LEUKOCYTES UA: NEGATIVE
Nitrite: NEGATIVE
PH: 6 (ref 5.0–8.0)
RBC / HPF: NONE SEEN (ref 0–?)
Specific Gravity, Urine: 1.015 (ref 1.000–1.030)
Total Protein, Urine: NEGATIVE
UROBILINOGEN UA: 0.2 (ref 0.0–1.0)
Urine Glucose: NEGATIVE

## 2017-03-16 LAB — LIPID PANEL
CHOLESTEROL: 181 mg/dL (ref 0–200)
HDL: 78.9 mg/dL (ref >39.00)
LDL CALC: 77 mg/dL (ref 0–99)
NONHDL: 101.72
Total CHOL/HDL Ratio: 2
Triglycerides: 122 mg/dL (ref 0.0–149.0)
VLDL: 24.4 mg/dL (ref 0.0–40.0)

## 2017-03-16 LAB — TSH: TSH: 0.88 u[IU]/mL (ref 0.35–4.50)

## 2017-03-16 MED ORDER — DICLOFENAC SODIUM 1 % TD GEL: 4.0000 g | Freq: Four times a day (QID) | TRANSDERMAL | 11 refills | Status: DC | PRN

## 2017-03-16 MED ORDER — ALPRAZOLAM 0.5 MG PO TABS: ORAL_TABLET | ORAL | 1 refills | Status: DC

## 2017-03-16 MED ORDER — GABAPENTIN 100 MG PO CAPS: 200.0000 mg | ORAL_CAPSULE | Freq: Two times a day (BID) | ORAL | 3 refills | Status: DC

## 2017-03-16 NOTE — Assessment & Plan Note (Signed)
stable overall by history and exam, recent data reviewed with pt, and pt to continue medical treatment as before,  to f/u any worsening symptoms or concerns BP Readings from Last 3 Encounters:  03/16/17 124/82  03/12/17 136/74  10/14/16 130/78

## 2017-03-16 NOTE — Assessment & Plan Note (Signed)
Now pain of both knees, for volt gel prn,  to f/u any worsening symptoms or concerns

## 2017-03-16 NOTE — Patient Instructions (Addendum)
Your EKG was OK today  Please take all new medication as prescribed - the gabapentin  Please take all new medication as prescribed - the voltaren gel for the knee pain  Please continue all other medications as before, and refills have been done if requested.  Please have the pharmacy call with any other refills you may need.  Please continue your efforts at being more active, low cholesterol diet, and weight control.  You are otherwise up to date with prevention measures today.  Please keep your appointments with your specialists as you may have planned  Please go to the XRAY Department in the Basement (go straight as you get off the elevator) for the x-ray testing  Please go to the LAB in the Basement (turn left off the elevator) for the tests to be done today  You will be contacted by phone if any changes need to be made immediately.  Otherwise, you will receive a letter about your results with an explanation, but please check with MyChart first.  Please remember to sign up for MyChart if you have not done so, as this will be important to you in the future with finding out test results, communicating by private email, and scheduling acute appointments online when needed.  Please return in 6 months, or sooner if needed

## 2017-03-16 NOTE — Assessment & Plan Note (Signed)
With possible recent panic attack, for contd benzo prn,  to f/u any worsening symptoms or concerns

## 2017-03-16 NOTE — Assessment & Plan Note (Addendum)
Etiology unclear, ecg reviewed, for cxr, but pt declines stress testing  In addition to the time spent performing CPE, I spent an additional 25 minutes face to face,in which greater than 50% of this time was spent in counseling and coordination of care for patient's acute illness as documented, including the differential dx, treatment, further evaluation and other management of chest pain, HTN, anxiety, knee pain, and chronic back pain

## 2017-03-16 NOTE — Assessment & Plan Note (Signed)

## 2017-03-16 NOTE — Assessment & Plan Note (Signed)
For restart gabapentin as this has helped previously

## 2017-03-16 NOTE — Progress Notes (Signed)
Subjective:    Patient ID: Ann Booth, female    DOB: October 10, 1926, 84 y.o.   MRN: [id number]  HPI  Here for wellness and f/u;  Overall doing ok;  Pt denies worsening SOB, DOE, wheezing, orthopnea, PND, worsening LE edema, palpitations, dizziness or syncope.  Pt denies neurological change such as new headache, facial or extremity weakness.  Pt denies polydipsia, polyuria, or low sugar symptoms. Pt states overall good compliance with treatment and medications, good tolerability, and has been trying to follow appropriate diet.  Pt denies worsening depressive symptoms, suicidal ideation or panic. No fever, night sweats, wt loss, loss of appetite, or other constitutional symptoms.  Pt states good ability with ADL's, has low fall risk, home safety reviewed and adequate, no other significant changes in hearing or vision, and only occasionally active with exercise. Declines shingrix.    Pt continues to have recurring daily LBP without change in severity, bowel or bladder change, fever, wt loss,  worsening LE pain/numbness/weakness, gait change or falls, is no candidate for back surgury for rods per pt, but pt asking for gabapentin refill.    Also states she has either a "panic attack or heart attack"  3 days ago with an episode SSCP described as pressure, dull, full feeling, not assoc with diaphoresis or other symptoms.  Asks for ECG but does not want stress testing.  No fever, ST, cough. Also c/o bilat knee pain, chronic, mild to mod persistent, worse to walk, better to sit or lie down, without giveaways or falls Past Medical History:  Diagnosis Date  . ALLERGIC RHINITIS   . ANXIETY   . ASTHMA   . Atrial fibrillation (HCC)   . BACK PAIN   . Carotid stenosis 04/27/2012   Mild left on community screening  - Nov 2013  . CATARACT NOS   . DEPRESSION   . GLAUCOMA NOS   . HTN (hypertension)   . Hypercalcemia   . MITRAL VALVE PROLAPSE   . OSTEOPOROSIS   . PERIPHERAL NEUROPATHY   . POSITIONAL VERTIGO    . Rosacea    Past Surgical History:  Procedure Laterality Date  .  ankle surgury     left ankle  . CATARACT EXTRACTION, BILATERAL    . HEMORRHOID SURGERY    . posterior chamber interocular lens implant      reports that  has never smoked. she has never used smokeless tobacco. She reports that she does not drink alcohol or use drugs. family history includes CAD in her mother; Cancer in her brother and sister. Allergies  Allergen Reactions  . Cymbalta [Duloxetine Hcl] Other (See Comments)   Current Outpatient Medications on File Prior to Visit  Medication Sig Dispense Refill  . b complex vitamins tablet Take 1 tablet by mouth daily.    . Biotin 1 MG CAPS Take 1 tablet by mouth daily.    . Calcium Carbonate-Vitamin D (CALCIUM + D PO) Take 1 tablet by mouth daily.     . Coenzyme Q10 (COQ-10) 100 MG CAPS Take 100 mg by mouth daily.     Marland Kitchen glucosamine-chondroitin 500-400 MG tablet Take 1 tablet by mouth 2 (two) times daily.    . IRON, FERROUS GLUCONATE, PO Take 1 tablet by mouth 2 (two) times a week.     . losartan (COZAAR) 100 MG tablet Take 1 tablet (100 mg total) by mouth daily. 90 tablet 0  . LUTEIN PO Take 1 tablet by mouth daily.    . Magnesium Citrate  100 MG TABS Take 100 mg by mouth daily.     . Multiple Vitamins-Minerals (MACULAR VITAMIN BENEFIT PO) Take 2 tablets by mouth daily.    . Multiple Vitamins-Minerals (MULTIVITAMIN & MINERAL PO) Take 1 tablet by mouth daily.    . Rivaroxaban (XARELTO) 15 MG TABS tablet Take 1 tablet (15 mg total) by mouth daily with supper. 90 tablet 1  . Thiamine HCl (B-1 PO) Take 1 tablet by mouth daily.     . timolol (BETIMOL) 0.5 % ophthalmic solution Place 1 drop into both eyes daily.    . vitamin B-12 (CYANOCOBALAMIN) 1000 MCG tablet Take 1,000 mcg by mouth every other day.      No current facility-administered medications on file prior to visit.    Review of Systems Constitutional: Negative for other unusual diaphoresis, sweats, appetite or  weight changes HENT: Negative for other worsening hearing loss, ear pain, facial swelling, mouth sores or neck stiffness.   Eyes: Negative for other worsening pain, redness or other visual disturbance.  Respiratory: Negative for other stridor or swelling Cardiovascular: Negative for other palpitations or other chest pain  Gastrointestinal: Negative for worsening diarrhea or loose stools, blood in stool, distention or other pain Genitourinary: Negative for hematuria, flank pain or other change in urine volume.  Musculoskeletal: Negative for myalgias or other joint swelling.  Skin: Negative for other color change, or other wound or worsening drainage.  Neurological: Negative for other syncope or numbness. Hematological: Negative for other adenopathy or swelling Psychiatric/Behavioral: Negative for hallucinations, other worsening agitation, SI, self-injury, or new decreased concentration All other system neg per pt    Objective:   Physical Exam BP 124/82   Pulse 64   Temp 97.6 F (36.4 C) (Oral)   Ht 5\' 2"  (1.575 m)   Wt 130 lb (59 kg)   SpO2 99%   BMI 23.78 kg/m  VS noted,  Constitutional: Pt is oriented to person, place, and time. Appears well-developed and well-nourished, in no significant distress and comfortable Head: Normocephalic and atraumatic  Eyes: Conjunctivae and EOM are normal. Pupils are equal, round, and reactive to light Right Ear: External ear normal without discharge Left Ear: External ear normal without discharge Nose: Nose without discharge or deformity Mouth/Throat: Oropharynx is without other ulcerations and moist  Neck: Normal range of motion. Neck supple. No JVD present. No tracheal deviation present or significant neck LA or mass Cardiovascular: Normal rate, regular rhythm, normal heart sounds and intact distal pulses.   Pulmonary/Chest: WOB normal and breath sounds without rales or wheezing  Abdominal: Soft. Bowel sounds are normal. NT. No HSM    Musculoskeletal: Normal range of motion. Exhibits no edema Lymphadenopathy: Has no other cervical adenopathy.  Neurological: Pt is alert and oriented to person, place, and time. Pt has normal reflexes. No cranial nerve deficit. Motor grossly intact, Gait intact Skin: Skin is warm and dry. No rash noted or new ulcerations Psychiatric:  Has nervous mood and affect. Behavior is normal without agitation No other exam findings Lab Results  Component Value Date   WBC 3.3 (L) 10/14/2016   HGB 13.1 10/14/2016   HCT 39.3 10/14/2016   PLT 212 10/14/2016   GLUCOSE 94 10/14/2016   CHOL 171 03/21/2016   TRIG 90.0 03/21/2016   HDL 80.10 03/21/2016   LDLCALC 73 03/21/2016   ALT 15 03/21/2016   AST 26 03/21/2016   NA 144 10/14/2016   K 4.4 10/14/2016   CL 105 10/14/2016   CREATININE 0.70 10/14/2016  BUN 15 10/14/2016   CO2 25 10/14/2016   TSH 1.37 03/21/2016   INR 1.09 01/24/2013   ECG today I have personally interpreted: Sinus Bradycardia 51, no ischemic changes such as ST or T wave changes    Assessment & Plan:

## 2017-03-24 DIAGNOSIS — M278 Other specified diseases of jaws: Secondary | ICD-10-CM | POA: Diagnosis not present

## 2017-03-30 DIAGNOSIS — H401112 Primary open-angle glaucoma, right eye, moderate stage: Secondary | ICD-10-CM | POA: Diagnosis not present

## 2017-03-30 DIAGNOSIS — H401123 Primary open-angle glaucoma, left eye, severe stage: Secondary | ICD-10-CM | POA: Diagnosis not present

## 2017-04-20 ENCOUNTER — Other Ambulatory Visit: Payer: Self-pay | Admitting: Internal Medicine

## 2017-04-20 DIAGNOSIS — I10 Essential (primary) hypertension: Secondary | ICD-10-CM

## 2017-04-23 ENCOUNTER — Encounter: Payer: Self-pay | Admitting: Family Medicine

## 2017-04-23 ENCOUNTER — Ambulatory Visit (INDEPENDENT_AMBULATORY_CARE_PROVIDER_SITE_OTHER): Payer: PPO | Admitting: Family Medicine

## 2017-04-23 ENCOUNTER — Ambulatory Visit (INDEPENDENT_AMBULATORY_CARE_PROVIDER_SITE_OTHER)
Admission: RE | Admit: 2017-04-23 | Discharge: 2017-04-23 | Disposition: A | Discharge status: Home / Self Care | Payer: PPO | Source: Ambulatory Visit | Attending: Family Medicine | Admitting: Family Medicine

## 2017-04-23 VITALS — BP 116/62 | HR 52 | Temp 98.5°F | Ht 62.0 in

## 2017-04-23 DIAGNOSIS — R0781 Pleurodynia: Secondary | ICD-10-CM | POA: Diagnosis not present

## 2017-04-23 DIAGNOSIS — S299XXA Unspecified injury of thorax, initial encounter: Secondary | ICD-10-CM | POA: Diagnosis not present

## 2017-04-23 NOTE — Patient Instructions (Signed)
Please try to ice the area.  We will call you with the results from today.

## 2017-04-23 NOTE — Progress Notes (Signed)
Ann Booth - 84 y.o. female MRN [id number]  Date of birth: Aug 06, 1926  SUBJECTIVE:  Including CC & ROS.  Chief Complaint  Patient presents with  . Fall    Ann Booth is a 84 y.o. female that is presenting with a fall. She fell last night at [employer] and landed on her right side. Admits to pain when she takes a deep breath. Denies dizziness or headaches. Has been taking motrin for the pain. Denies any bruising. The pain is mild. The pain is localized.    Review of Systems  Constitutional: Negative for fever.  Respiratory: Negative for shortness of breath.   Gastrointestinal: Negative for abdominal pain.  Genitourinary: Positive for flank pain.  Musculoskeletal: Positive for arthralgias.  Skin: Negative for color change.    HISTORY: Past Medical, Surgical, Social, and Family History Reviewed & Updated per EMR.   Pertinent Historical Findings include:  Past Medical History:  Diagnosis Date  . ALLERGIC RHINITIS   . ANXIETY   . ASTHMA   . Atrial fibrillation (HCC)   . BACK PAIN   . Carotid stenosis 04/27/2012   Mild left on community screening  - Nov 2013  . CATARACT NOS   . DEPRESSION   . GLAUCOMA NOS   . HTN (hypertension)   . Hypercalcemia   . MITRAL VALVE PROLAPSE   . OSTEOPOROSIS   . PERIPHERAL NEUROPATHY   . POSITIONAL VERTIGO   . Rosacea     Past Surgical History:  Procedure Laterality Date  .  ankle surgury     left ankle  . CATARACT EXTRACTION, BILATERAL    . HEMORRHOID SURGERY    . posterior chamber interocular lens implant      Allergies  Allergen Reactions  . Cymbalta [Duloxetine Hcl] Other (See Comments)    Family History  Problem Relation Age of Onset  . CAD Mother        MI at age 23  . Cancer Sister        colon  . Cancer Brother        prostate cancer     Social History   Socioeconomic History  . Marital status: Widowed    Spouse name: Not on file  . Number of children: 0  . Years of education: Not on file  . Highest  education level: Not on file  Social Needs  . Financial resource strain: Not hard at all  . Food insecurity - worry: Never true  . Food insecurity - inability: Never true  . Transportation needs - medical: No  . Transportation needs - non-medical: No  Occupational History  . Occupation: retired Control and instrumentation engineer: RETIRED  Tobacco Use  . Smoking status: Never Smoker  . Smokeless tobacco: Never Used  Substance and Sexual Activity  . Alcohol use: No  . Drug use: No  . Sexual activity: Not Currently  Other Topics Concern  . Not on file  Social History Narrative  . Not on file     PHYSICAL EXAM:  VS: BP 116/62 (BP Location: Left Arm, Patient Position: Sitting, Cuff Size: Normal)   Pulse (!) 52   Temp 98.5 F (36.9 C) (Oral)   Ht 5\' 2"  (1.575 m)   SpO2 98%   BMI 23.78 kg/m  Physical Exam Gen: NAD, alert, cooperative with exam, well-appearing ENT: normal lips, normal nasal mucosa,  Eye: normal EOM, normal conjunctiva and lids CV:  no edema, +2 pedal pulses   Resp: no accessory muscle  use, non-labored, no dullness, clear to auscultation bilaterally Skin: no rashes, no areas of induration  Neuro: normal tone, normal sensation to touch Psych:  normal insight, alert and oriented MSK:  Right flank:  Normal shoulder ROM  TTP of the mid axillary line over the 5th and 6th rib  No step offs  No crepitus  Neurovascularly intact     ASSESSMENT & PLAN:   Rib pain Likely contusion vs fracture from fall. No suggestion of PTX.  - xrays  - ibuprofen and tylenol for pain  - follow up if no improvement. May need to consider CT vs [place]

## 2017-04-24 ENCOUNTER — Telehealth: Payer: Self-pay | Admitting: Internal Medicine

## 2017-04-24 DIAGNOSIS — R0781 Pleurodynia: Secondary | ICD-10-CM | POA: Insufficient documentation

## 2017-04-24 NOTE — Assessment & Plan Note (Signed)
Likely contusion vs fracture from fall. No suggestion of PTX.  - xrays  - ibuprofen and tylenol for pain  - follow up if no improvement. May need to consider CT vs [place]

## 2017-04-24 NOTE — Telephone Encounter (Signed)
Copied from CRM [id number]. Topic: Quick Communication - See Telephone Encounter >> Apr 24, 2017  5:22 PM Floria Raveling A wrote: CRM for notification. See Telephone encounter for:  pt called in and would like someone to call her with her xray results  Best number [phone]  04/24/17.

## 2017-04-27 ENCOUNTER — Encounter: Payer: Self-pay | Admitting: *Deleted

## 2017-04-27 NOTE — Telephone Encounter (Signed)
Left patient a message to return call

## 2017-04-28 NOTE — Telephone Encounter (Signed)
Patient would like a call back in regards to her xray results. She said she keeps missing the calls. Please call pt [phone]

## 2017-04-28 NOTE — Telephone Encounter (Signed)
Pt returning [name] phone call she think its about her xray

## 2017-04-30 NOTE — Telephone Encounter (Signed)
Spoke with patient notified of results.

## 2017-05-21 ENCOUNTER — Telehealth: Payer: Self-pay | Admitting: *Deleted

## 2017-05-21 ENCOUNTER — Ambulatory Visit: Payer: PPO | Admitting: Family Medicine

## 2017-05-21 ENCOUNTER — Encounter: Payer: Self-pay | Admitting: Family Medicine

## 2017-05-21 VITALS — BP 120/74 | HR 56 | Ht 62.0 in | Wt 130.0 lb

## 2017-05-21 DIAGNOSIS — M79605 Pain in left leg: Secondary | ICD-10-CM | POA: Diagnosis not present

## 2017-05-21 DIAGNOSIS — M79604 Pain in right leg: Secondary | ICD-10-CM

## 2017-05-21 DIAGNOSIS — L84 Corns and callosities: Secondary | ICD-10-CM

## 2017-05-21 DIAGNOSIS — I739 Peripheral vascular disease, unspecified: Secondary | ICD-10-CM | POA: Insufficient documentation

## 2017-05-21 DIAGNOSIS — M79671 Pain in right foot: Secondary | ICD-10-CM | POA: Diagnosis not present

## 2017-05-21 NOTE — Telephone Encounter (Signed)
Pt asked for the medication Dr. Al Corpus injected 02/2015. I informed pt the injection contained dexamethasone and asked what purpose did she need the name of an injectable from 2017. Pt states after the injection the area periodically gets sore. I told pt that if the are had improved then periodically became painful, the injection took care of the symptoms, and then there was another flare of symptoms, and if the cause of the flare was problem shoe gear or foot structure problems were not taken care of then the flares would continue.

## 2017-05-21 NOTE — Assessment & Plan Note (Signed)
Significant concern with the discoloration and edema decreased pulses palpated from the ankles distally.  Understands the potential risk at this time.  Patient is a fall risk and does have vertigo so will need to be careful with different medications and we may want to try.  May need potentially other surgical intervention if patient is not considered high enough risk.  We will see what may be a shows and then further evaluate.

## 2017-05-21 NOTE — Patient Instructions (Signed)
Good to see you pennsaid pinkie amount topically 2 times daily as needed.   Wart removal cream to the area  We are scheduling for a test for your blood flow of your legs and feet Try the boot to wear  See me again in 2-3 weeks and we can see if we can take callus off.

## 2017-05-21 NOTE — Assessment & Plan Note (Signed)
Of her fifth metatarsal and patient has declined debridement today

## 2017-05-21 NOTE — Progress Notes (Signed)
[name] [address] [place], [place] [address] Phone: [phone] Subjective:      CC: Foot pain.  WGN:[id number]  Ann Booth is a 84 y.o. female coming in with complaint of right foot pain. Pain from an injection from 2 years ago. States that it's inflamed. Pain radiates all over the foot.  Patient was actually seen by me approximately 1 year ago he did have a callus over the fifth metatarsal.  Did need debridement initially and was doing much better.  Onset- Chronic  Location- lateral foot Duration-  Character- Aggravating factors- Walking Reliving factors-  Therapies tried- Vitamin E oil, Vaseline, antibiotic cream Severity-patient states that the severity is worsening 8 out of 10     Past Medical History:  Diagnosis Date  . ALLERGIC RHINITIS   . ANXIETY   . ASTHMA   . Atrial fibrillation (HCC)   . BACK PAIN   . Carotid stenosis 04/27/2012   Mild left on community screening  - Nov 2013  . CATARACT NOS   . DEPRESSION   . GLAUCOMA NOS   . HTN (hypertension)   . Hypercalcemia   . MITRAL VALVE PROLAPSE   . OSTEOPOROSIS   . PERIPHERAL NEUROPATHY   . POSITIONAL VERTIGO   . Rosacea    Past Surgical History:  Procedure Laterality Date  .  ankle surgury     left ankle  . CATARACT EXTRACTION, BILATERAL    . HEMORRHOID SURGERY    . posterior chamber interocular lens implant     Social History   Socioeconomic History  . Marital status: Widowed    Spouse name: Not on file  . Number of children: 0  . Years of education: Not on file  . Highest education level: Not on file  Occupational History  . Occupation: retired Control and instrumentation engineer: RETIRED  Social Needs  . Financial resource strain: Not hard at all  . Food insecurity:    Worry: Never true    Inability: Never true  . Transportation needs:    Medical: No    Non-medical: No  Tobacco Use  . Smoking status: Never Smoker  . Smokeless tobacco: Never Used  Substance  and Sexual Activity  . Alcohol use: No  . Drug use: No  . Sexual activity: Not Currently  Lifestyle  . Physical activity:    Days per week: 0 days    Minutes per session: 0 min  . Stress: To some extent  Relationships  . Social connections:    Talks on phone: More than three times a week    Gets together: More than three times a week    Attends religious service: More than 4 times per year    Active member of club or organization: Not on file    Attends meetings of clubs or organizations: More than 4 times per year    Relationship status: Widowed  Other Topics Concern  . Not on file  Social History Narrative  . Not on file   Allergies  Allergen Reactions  . Cymbalta [Duloxetine Hcl] Other (See Comments)   Family History  Problem Relation Age of Onset  . CAD Mother        MI at age 85  . Cancer Sister        colon  . Cancer Brother        prostate cancer     Past medical history, social, surgical and family history all reviewed  in electronic medical record.  No pertanent information unless stated regarding to the chief complaint.   Review of Systems:Review of systems updated and as accurate as of 05/21/17  No headache, visual changes, nausea, vomiting, diarrhea, constipation, dizziness, abdominal pain, skin rash, fevers, chills, night sweats, weight loss, swollen lymph nodes, body aches, joint swelling,  chest pain, shortness of breath, mood changes.  Positive muscle aches  Objective  Blood pressure 120/74, pulse (!) 56, height 5\' 2"  (1.575 m), weight 130 lb (59 kg), SpO2 97 %. Systems examined below as of 05/21/17   General: No apparent distress alert and oriented x3 mood and affect normal, dressed appropriately.  HEENT: Pupils equal, extraocular movements intact  Respiratory: Patient's speak in full sentences and does not appear short of breath  Cardiovascular: No lower extremity edema, non tender, patient lower extremities show significant decreased pulses noted.   Discoloration of the toes noted. Skin: Warm dry intact with no signs of infection or rash on extremities or on axial skeleton.  Abdomen: Soft nontender  Neuro: Cranial nerves II through XII are intact, neuropathy noted of the feet bilaterally.  Lymph: No lymphadenopathy of posterior or anterior cervical chain or axillae bilaterally.  Gait severely antalgic walking with the aid of a cane MSK: Severe arthritic changes of multiple joints. Patient shows severe breakdown of the longitudinal as well as transverse arch bilaterally.  Once again significant discoloration of the toes with a bluish hue.  No dorsalis pedis pulses appreciated bilaterally 1+ on the left posterior tibialis and minimal pulse felt on the right.  Right foot has callus formation over the lateral aspect with some mild erythema in the surrounding area.  Severely tender to palpation in the area at the proximal fifth metatarsal.     Impression and Recommendations:     This case required medical decision making of moderate complexity.      Note: This dictation was prepared with Dragon dictation along with smaller phrase technology. Any transcriptional errors that result from this process are unintentional.

## 2017-05-21 NOTE — Assessment & Plan Note (Signed)
Patient does have worsening callus formation over the proximal fifth metatarsal.  Discussed with patient that we should consider possibility of debriding again.  Patient declined this.  Mild erythema in the area that is likely secondary to the topical vitamin E she is using which we encouraged her not to do.  Possible infectious etiology was discussed but likely not.  We discussed about prophylactically taking antibiotics which patient declined as well.  We discussed possible imaging which patient declined.  Patient was given a postop shoe which patient declined to wear.  Patient does have weakness of the lower extremities and next patient concerning large fall risk as well as the peripheral neuropathy.  More concerned with the likely peripheral vascular disease at this point but seems to be severe.  ABIs ordered today for further evaluation.  Discussed following up again in 2 weeks to make sure patient is making some improvement.

## 2017-05-22 ENCOUNTER — Telehealth: Payer: Self-pay | Admitting: Family Medicine

## 2017-05-22 NOTE — Telephone Encounter (Signed)
Copied from CRM [id number]. Topic: General - Other >> May 22, 2017 11:33 AM Maia Petties wrote: Pt not sure where to apply pennsaid that was given to her. She said the info with the med says a lot about the knees but she has a sore on her foot. Pt requesting call back.

## 2017-05-25 NOTE — Telephone Encounter (Signed)
Patient wanted to know if she could put pennsaid on a sore on her foot. Recommended that she should not use it on skin that is broken open including this sore. Patient voices understanding.

## 2017-05-27 ENCOUNTER — Ambulatory Visit (HOSPITAL_COMMUNITY)
Admission: RE | Admit: 2017-05-27 | Discharge: 2017-05-27 | Disposition: A | Discharge status: Home / Self Care | Payer: PPO | Source: Ambulatory Visit | Attending: Cardiovascular Disease | Admitting: Cardiovascular Disease

## 2017-05-27 DIAGNOSIS — I10 Essential (primary) hypertension: Secondary | ICD-10-CM | POA: Diagnosis not present

## 2017-05-27 DIAGNOSIS — M79605 Pain in left leg: Secondary | ICD-10-CM | POA: Diagnosis not present

## 2017-05-27 DIAGNOSIS — R531 Weakness: Secondary | ICD-10-CM | POA: Diagnosis not present

## 2017-05-27 DIAGNOSIS — E785 Hyperlipidemia, unspecified: Secondary | ICD-10-CM | POA: Diagnosis not present

## 2017-05-27 DIAGNOSIS — M79604 Pain in right leg: Secondary | ICD-10-CM | POA: Insufficient documentation

## 2017-05-28 NOTE — Progress Notes (Signed)
HPI: FU atrial fibrillation. Monitor in August of 2014 showed PAF. Toprol DCed previously due to bradycardia. Echocardiogram in November 2015 showed normal LV function, grade 1 diastolic dysfunction, mild mitral regurgitation with systolic prolapse of anterior mitral valve leaflet. Since I last saw her,  she denies dyspnea, chest pain, palpitations or syncope.  Current Outpatient Medications  Medication Sig Dispense Refill  . ALPRAZolam (XANAX) 0.5 MG tablet 1/2 - 1 tab by mouth at bedtime as needed 90 tablet 1  . b complex vitamins tablet Take 1 tablet by mouth daily.    . Biotin 1 MG CAPS Take 1 tablet by mouth daily.    . Calcium Carbonate-Vitamin D (CALCIUM + D PO) Take 1 tablet by mouth daily.     . Coenzyme Q10 (COQ-10) 100 MG CAPS Take 100 mg by mouth daily.     . diclofenac sodium (VOLTAREN) 1 % GEL Apply 4 g topically 4 (four) times daily as needed. 200 g 11  . glucosamine-chondroitin 500-400 MG tablet Take 1 tablet by mouth 2 (two) times daily.    . IRON, FERROUS GLUCONATE, PO Take 1 tablet by mouth 2 (two) times a week.     . losartan (COZAAR) 100 MG tablet TAKE 1 TABLET BY MOUTH  DAILY 90 tablet 3  . LUTEIN PO Take 1 tablet by mouth daily.    . Magnesium Citrate 100 MG TABS Take 100 mg by mouth daily.     . Multiple Vitamins-Minerals (MACULAR VITAMIN BENEFIT PO) Take 2 tablets by mouth daily.    . Multiple Vitamins-Minerals (MULTIVITAMIN & MINERAL PO) Take 1 tablet by mouth daily.    . Rivaroxaban (XARELTO) 15 MG TABS tablet Take 1 tablet (15 mg total) by mouth daily with supper. 90 tablet 1  . Thiamine HCl (B-1 PO) Take 1 tablet by mouth daily.     . timolol (BETIMOL) 0.5 % ophthalmic solution Place 1 drop into both eyes daily.    . vitamin B-12 (CYANOCOBALAMIN) 1000 MCG tablet Take 1,000 mcg by mouth every other day.     . gabapentin (NEURONTIN) 100 MG capsule Take 2 capsules (200 mg total) by mouth 2 (two) times daily. (Patient not taking: Reported on 06/02/2017) 270  capsule 3   No current facility-administered medications for this visit.      Past Medical History:  Diagnosis Date  . ALLERGIC RHINITIS   . ANXIETY   . ASTHMA   . Atrial fibrillation (HCC)   . BACK PAIN   . Carotid stenosis 04/27/2012   Mild left on community screening  - Nov 2013  . CATARACT NOS   . DEPRESSION   . GLAUCOMA NOS   . HTN (hypertension)   . Hypercalcemia   . MITRAL VALVE PROLAPSE   . OSTEOPOROSIS   . PERIPHERAL NEUROPATHY   . POSITIONAL VERTIGO   . Rosacea     Past Surgical History:  Procedure Laterality Date  .  ankle surgury     left ankle  . CATARACT EXTRACTION, BILATERAL    . HEMORRHOID SURGERY    . posterior chamber interocular lens implant      Social History   Socioeconomic History  . Marital status: Widowed    Spouse name: Not on file  . Number of children: 0  . Years of education: Not on file  . Highest education level: Not on file  Occupational History  . Occupation: retired Control and instrumentation engineer: RETIRED  Social Needs  . Financial  resource strain: Not hard at all  . Food insecurity:    Worry: Never true    Inability: Never true  . Transportation needs:    Medical: No    Non-medical: No  Tobacco Use  . Smoking status: Never Smoker  . Smokeless tobacco: Never Used  Substance and Sexual Activity  . Alcohol use: No  . Drug use: No  . Sexual activity: Not Currently  Lifestyle  . Physical activity:    Days per week: 0 days    Minutes per session: 0 min  . Stress: To some extent  Relationships  . Social connections:    Talks on phone: More than three times a week    Gets together: More than three times a week    Attends religious service: More than 4 times per year    Active member of club or organization: Not on file    Attends meetings of clubs or organizations: More than 4 times per year    Relationship status: Widowed  . Intimate partner violence:    Fear of current or ex partner: Not on file    Emotionally abused: Not  on file    Physically abused: Not on file    Forced sexual activity: Not on file  Other Topics Concern  . Not on file  Social History Narrative  . Not on file    Family History  Problem Relation Age of Onset  . CAD Mother        MI at age 57  . Cancer Sister        colon  . Cancer Brother        prostate cancer    ROS: no fevers or chills, productive cough, hemoptysis, dysphasia, odynophagia, melena, hematochezia, dysuria, hematuria, rash, seizure activity, orthopnea, PND, pedal edema, claudication. Remaining systems are negative.  Physical Exam: Well-developed well-nourished in no acute distress.  Skin is warm and dry.  HEENT is normal.  Neck is supple.  Chest is clear to auscultation with normal expansion.  Cardiovascular exam is regular rate and rhythm.  Abdominal exam nontender or distended. No masses palpated. Extremities show small foot and some erythema of feet bilaterally neuro grossly intact  A/P  1 paroxysmal atrial fibrillation-patient remains in sinus rhythm on examination today.  Beta-blocker discontinued because of bradycardia.  Continue Xarelto.  Check hemoglobin and renal function.  2 hypertension-blood pressure is controlled.  Continue present medications.  3 mitral regurgitation-mild on most recent echocardiogram.  Olga Millers, MD

## 2017-06-02 ENCOUNTER — Encounter: Payer: Self-pay | Admitting: Cardiology

## 2017-06-02 ENCOUNTER — Ambulatory Visit: Payer: PPO | Admitting: Cardiology

## 2017-06-02 VITALS — BP 110/64 | HR 54 | Ht 62.0 in | Wt 127.4 lb

## 2017-06-02 DIAGNOSIS — I480 Paroxysmal atrial fibrillation: Secondary | ICD-10-CM | POA: Diagnosis not present

## 2017-06-02 DIAGNOSIS — I059 Rheumatic mitral valve disease, unspecified: Secondary | ICD-10-CM

## 2017-06-02 DIAGNOSIS — I10 Essential (primary) hypertension: Secondary | ICD-10-CM

## 2017-06-02 NOTE — Patient Instructions (Signed)

## 2017-06-03 ENCOUNTER — Encounter: Payer: Self-pay | Admitting: *Deleted

## 2017-06-03 LAB — CBC
HEMATOCRIT: 39.9 % (ref 34.0–46.6)
Hemoglobin: 12.8 g/dL (ref 11.1–15.9)
MCH: 31.2 pg (ref 26.6–33.0)
MCHC: 32.1 g/dL (ref 31.5–35.7)
MCV: 97 fL (ref 79–97)
PLATELETS: 194 10*3/uL (ref 150–379)
RBC: 4.1 x10E6/uL (ref 3.77–5.28)
RDW: 13.6 % (ref 12.3–15.4)
WBC: 3.4 10*3/uL (ref 3.4–10.8)

## 2017-06-03 LAB — BASIC METABOLIC PANEL
BUN/Creatinine Ratio: 19 (ref 12–28)
BUN: 13 mg/dL (ref 8–27)
CALCIUM: 10.1 mg/dL (ref 8.7–10.3)
CHLORIDE: 105 mmol/L (ref 96–106)
CO2: 27 mmol/L (ref 20–29)
Creatinine, Ser: 0.7 mg/dL (ref 0.57–1.00)
GFR calc Af Amer: 89 mL/min/{1.73_m2} (ref >59)
GFR, EST NON AFRICAN AMERICAN: 77 mL/min/{1.73_m2} (ref >59)
Glucose: 89 mg/dL (ref 65–99)
POTASSIUM: 4.8 mmol/L (ref 3.5–5.2)
Sodium: 144 mmol/L (ref 134–144)

## 2017-06-05 ENCOUNTER — Encounter: Payer: Self-pay | Admitting: *Deleted

## 2017-06-05 ENCOUNTER — Ambulatory Visit: Payer: Self-pay | Admitting: *Deleted

## 2017-06-05 NOTE — Telephone Encounter (Signed)
Pt being followed by Dr. Katrinka Blazing for right foot pain that has a callus on it as well.Pt asking if she can have medications called for right foot pain or if she can be seen for an appt sooner than 4/18.Pt states she takes Tylenol arthritis does not work for pain.Pt states she can not take ibuprofen due to being on xarelto. Pt states she has experienced a increase in pain and has foot swelling. Pt asking if an antibiotic can be called in. Pt denies any redness or fever to the foot. Pt has appt scheduled on 4/18 but wants to know what she can do for pain control until that time. Pt can be contacted at [phone].   Reason for Disposition . Foot pain is a chronic symptom (recurrent or ongoing AND present > 4 weeks)  Answer Assessment - Initial Assessment Questions 1. ONSET: "When did the pain start?"      Has experienced the pain since last month 2. LOCATION: "Where is the pain located?"      Right 3. PAIN: "How bad is the pain?"    (Scale 1-10; or mild, moderate, severe)   -  MILD (1-3): doesn't interfere with normal activities    -  MODERATE (4-7): interferes with normal activities (e.g., work or school) or awakens from sleep, limping    -  SEVERE (8-10): excruciating pain, unable to do any normal activities, unable to walk     Severe 8, makes it works 4. WORK OR EXERCISE: "Has there been any recent work or exercise that involved this part of the body?"      No 5. CAUSE: "What do you think is causing the foot pain?"     Pt has sore on her foot 6. OTHER SYMPTOMS: "Do you have any other symptoms?" (e.g., leg pain, rash, fever, numbness)     No 7. PREGNANCY: "Is there any chance you are pregnant?" "When was your last menstrual period?"     n/a  Protocols used: FOOT PAIN-A-AH

## 2017-06-05 NOTE — Telephone Encounter (Signed)
This encounter was created in error - please disregard.

## 2017-06-06 MED ORDER — DOXYCYCLINE HYCLATE 100 MG PO TABS: 100.0000 mg | ORAL_TABLET | Freq: Two times a day (BID) | ORAL | 0 refills | Status: DC

## 2017-06-06 NOTE — Telephone Encounter (Signed)
Can send antibiotic if concern but last follow up no sign of infection.  Will see if we have cancellation

## 2017-06-06 NOTE — Addendum Note (Signed)
Addended by: Judi Saa on: 06/06/2017 08:12 PM   Modules accepted: Orders

## 2017-06-08 NOTE — Telephone Encounter (Signed)
Spoke to pt, she has not picked up the abx yet. I advised her to go ahead & pick that up. She is going to keep her appt on 4.18.19.

## 2017-06-08 NOTE — Telephone Encounter (Signed)
lmovm for pt to return call.  

## 2017-06-11 ENCOUNTER — Ambulatory Visit: Payer: PPO | Admitting: Family Medicine

## 2017-06-11 ENCOUNTER — Encounter: Payer: Self-pay | Admitting: Family Medicine

## 2017-06-11 ENCOUNTER — Ambulatory Visit (INDEPENDENT_AMBULATORY_CARE_PROVIDER_SITE_OTHER)
Admission: RE | Admit: 2017-06-11 | Discharge: 2017-06-11 | Disposition: A | Discharge status: Home / Self Care | Payer: PPO | Source: Ambulatory Visit | Attending: Family Medicine | Admitting: Family Medicine

## 2017-06-11 VITALS — BP 118/72 | HR 77 | Ht 62.0 in | Wt 128.0 lb

## 2017-06-11 DIAGNOSIS — M79671 Pain in right foot: Secondary | ICD-10-CM

## 2017-06-11 DIAGNOSIS — L84 Corns and callosities: Secondary | ICD-10-CM | POA: Diagnosis not present

## 2017-06-11 DIAGNOSIS — M19071 Primary osteoarthritis, right ankle and foot: Secondary | ICD-10-CM | POA: Diagnosis not present

## 2017-06-11 NOTE — Progress Notes (Signed)
[name] [address] [place], [place] [address] Phone: [phone] Subjective:    I'm seeing this patient by the request  of:    CC: Foot pain  PYK:[id number]  Ann Booth is a 84 y.o. female coming in with complaint of right-sided foot pain.  Right-sided foot pain.  Has been seen previously and has had a callus over the fifth metatarsal.  Patient saw me previously but states that she was having worsening pain with redness.  Patient was given antibiotic prophylactically.  States that this is helped the redness and pain significantly.  Still has a throbbing sensation though at night.  Unable to wear certain shoes secondary to the amount of pain and discomfort.    Past Medical History:  Diagnosis Date  . ALLERGIC RHINITIS   . ANXIETY   . ASTHMA   . Atrial fibrillation (HCC)   . BACK PAIN   . Carotid stenosis 04/27/2012   Mild left on community screening  - Nov 2013  . CATARACT NOS   . DEPRESSION   . GLAUCOMA NOS   . HTN (hypertension)   . Hypercalcemia   . MITRAL VALVE PROLAPSE   . OSTEOPOROSIS   . PERIPHERAL NEUROPATHY   . POSITIONAL VERTIGO   . Rosacea    Past Surgical History:  Procedure Laterality Date  .  ankle surgury     left ankle  . CATARACT EXTRACTION, BILATERAL    . HEMORRHOID SURGERY    . posterior chamber interocular lens implant     Social History   Socioeconomic History  . Marital status: Widowed    Spouse name: Not on file  . Number of children: 0  . Years of education: Not on file  . Highest education level: Not on file  Occupational History  . Occupation: retired Control and instrumentation engineer: RETIRED  Social Needs  . Financial resource strain: Not hard at all  . Food insecurity:    Worry: Never true    Inability: Never true  . Transportation needs:    Medical: No    Non-medical: No  Tobacco Use  . Smoking status: Never Smoker  . Smokeless tobacco: Never Used  Substance and Sexual Activity  . Alcohol use:  No  . Drug use: No  . Sexual activity: Not Currently  Lifestyle  . Physical activity:    Days per week: 0 days    Minutes per session: 0 min  . Stress: To some extent  Relationships  . Social connections:    Talks on phone: More than three times a week    Gets together: More than three times a week    Attends religious service: More than 4 times per year    Active member of club or organization: Not on file    Attends meetings of clubs or organizations: More than 4 times per year    Relationship status: Widowed  Other Topics Concern  . Not on file  Social History Narrative  . Not on file   Allergies  Allergen Reactions  . Cymbalta [Duloxetine Hcl] Other (See Comments)   Family History  Problem Relation Age of Onset  . CAD Mother        MI at age 52  . Cancer Sister        colon  . Cancer Brother        prostate cancer     Past medical history, social, surgical and family history all reviewed in  electronic medical record.  No pertanent information unless stated regarding to the chief complaint.   Review of Systems:Review of systems updated and as accurate as of 06/11/17  No headache, visual changes, nausea, vomiting, diarrhea, constipation, dizziness, abdominal pain, skin rash, fevers, chills, night sweats, weight loss, swollen lymph nodes, body aches, joint swelling,  chest pain, shortness of breath, mood changes.  Positive muscle aches mostly at night  Objective  Blood pressure 118/72, pulse 77, height 5\' 2"  (1.575 m), weight 128 lb (58.1 kg), SpO2 96 %. Systems examined below as of 06/11/17   General: No apparent distress alert and oriented x3 mood and affect normal, dressed appropriately.  HEENT: Pupils equal, extraocular movements intact  Respiratory: Patient's speak in full sentences and does not appear short of breath  Cardiovascular: 1+ lower extremity edema, non tender, no erythema  Skin: Warm dry intact with no signs of infection or rash on extremities or on  axial skeleton.  Abdomen: Soft nontender  Neuro: Cranial nerves II through XII are intact, neurovascularly intact in all extremities with 2+ DTRs  Lymph: No lymphadenopathy of posterior or anterior cervical chain or axillae bilaterally.  Gait antalgic gait MSK: Severe arthritic changes of multiple joints. Right foot exam shows patient does have significant arthritic changes.  Patient does have breakdown of the transverse arch.  Patient does have some fibular deviation of the midfoot.  Callus formation noted over the fifth metatarsal.  Patient does have significant peripheral neuropathy.  Patient though is tender over the callus formation.  No signs of any active infection at this point.  Procedure note With a 10 blade patient did have debridement of the fifth callus formation.  Went down to new skin.  No blood loss.  No signs of any infection or pus.  Patient did have an triple antibiotic placed and then wrapped in Cob[place]    Impression and Recommendations:     This case required medical decision making of moderate complexity.      Note: This dictation was prepared with Dragon dictation along with smaller phrase technology. Any transcriptional errors that result from this process are unintentional.

## 2017-06-11 NOTE — Patient Instructions (Addendum)
Good to see you  We will get an xray downstairs.  Ice can help  Continue the antibiotic for now.  Injected the knee as well  Leave the bandage on until Saturday unless it is painful.  See me again in 2 weeks.  We may need to consider orthotics in the long run

## 2017-06-11 NOTE — Assessment & Plan Note (Signed)
Debridement done again today.  Discussed with patient in great length.  Patient will finish with the doxycycline that she was given prophylactically.  I did not see the significant erythema before the patient was quite adamant she felt like it was infected.  We discussed the possibility of labs such as an ESR as well as a CBC which patient declined but she is willing to get an x-ray.  We will evaluate for any type of underlying fracture as well as potentially osteomyelitis.  Patient is in agreement with the plan.  Seem to be doing very well though.  Follow-up again 2 weeks

## 2017-06-25 ENCOUNTER — Ambulatory Visit: Payer: PPO | Admitting: Family Medicine

## 2017-06-25 DIAGNOSIS — M1712 Unilateral primary osteoarthritis, left knee: Secondary | ICD-10-CM | POA: Insufficient documentation

## 2017-06-25 MED ORDER — DOXYCYCLINE HYCLATE 100 MG PO TABS: 100.0000 mg | ORAL_TABLET | Freq: Two times a day (BID) | ORAL | 0 refills | Status: DC

## 2017-06-25 NOTE — Assessment & Plan Note (Signed)
Failed all conservative therapy.  Discussed icing regimen and home exercises.  Topical anti-inflammatories given.  Discussed custom bracing which patient declined because she does not feel like she will wear it.  Encourage patient to use the cane when out of the house.  Follow-up again in 6 weeks

## 2017-06-25 NOTE — Progress Notes (Signed)
[name] [address] [place], [place] [address] Phone: [phone] Subjective:    I'm seeing this patient by the request  of:    CC: Left knee pain  OTR:[id number]  Ann Booth is a 84 y.o. female coming in with complaint of left knee pain. She was given an injection last visit but states that the injection did not help. She feels the her knee "pops" out of place when she is lying in bed. She also feels like she is going to fall due to the pain.   She feels that the antibiotic helped her foot pain which she is not having any more.  Patient states that is doing relatively well.  Wants to know if she should do another round of antibiotics.       Past Medical History:  Diagnosis Date  . ALLERGIC RHINITIS   . ANXIETY   . ASTHMA   . Atrial fibrillation (HCC)   . BACK PAIN   . Carotid stenosis 04/27/2012   Mild left on community screening  - Nov 2013  . CATARACT NOS   . DEPRESSION   . GLAUCOMA NOS   . HTN (hypertension)   . Hypercalcemia   . MITRAL VALVE PROLAPSE   . OSTEOPOROSIS   . PERIPHERAL NEUROPATHY   . POSITIONAL VERTIGO   . Rosacea    Past Surgical History:  Procedure Laterality Date  .  ankle surgury     left ankle  . CATARACT EXTRACTION, BILATERAL    . HEMORRHOID SURGERY    . posterior chamber interocular lens implant     Social History   Socioeconomic History  . Marital status: Widowed    Spouse name: Not on file  . Number of children: 0  . Years of education: Not on file  . Highest education level: Not on file  Occupational History  . Occupation: retired Control and instrumentation engineer: RETIRED  Social Needs  . Financial resource strain: Not hard at all  . Food insecurity:    Worry: Never true    Inability: Never true  . Transportation needs:    Medical: No    Non-medical: No  Tobacco Use  . Smoking status: Never Smoker  . Smokeless tobacco: Never Used  Substance and Sexual Activity  . Alcohol use: No  . Drug  use: No  . Sexual activity: Not Currently  Lifestyle  . Physical activity:    Days per week: 0 days    Minutes per session: 0 min  . Stress: To some extent  Relationships  . Social connections:    Talks on phone: More than three times a week    Gets together: More than three times a week    Attends religious service: More than 4 times per year    Active member of club or organization: Not on file    Attends meetings of clubs or organizations: More than 4 times per year    Relationship status: Widowed  Other Topics Concern  . Not on file  Social History Narrative  . Not on file   Allergies  Allergen Reactions  . Cymbalta [Duloxetine Hcl] Other (See Comments)   Family History  Problem Relation Age of Onset  . CAD Mother        MI at age 34  . Cancer Sister        colon  . Cancer Brother        prostate cancer  Past medical history, social, surgical and family history all reviewed in electronic medical record.  No pertanent information unless stated regarding to the chief complaint.   Review of Systems:Review of systems updated and as accurate as of 06/25/17  No headache, visual changes, nausea, vomiting, diarrhea, constipation, dizziness, abdominal pain, skin rash, fevers, chills, night sweats, weight loss, swollen lymph nodes, body aches, chest pain, shortness of breath, mood changes.  Positive muscle aches and joint swelling  Objective  Blood pressure 118/80, pulse (!) 56, height 5\' 2"  (1.575 m), weight 126 lb (57.2 kg), SpO2 98 %. Systems examined below as of 06/25/17   General: No apparent distress alert and oriented x3 mood and affect normal, dressed appropriately.  HEENT: Pupils equal, extraocular movements intact  Respiratory: Patient's speak in full sentences and does not appear short of breath  Cardiovascular: No lower extremity edema, non tender, no erythema  Skin: Warm dry intact with no signs of infection or rash on extremities or on axial skeleton.    Abdomen: Soft nontender  Neuro: Cranial nerves II through XII are intact, neurovascularly intact in all extremities with 2+ DTRs and 2+ pulses.  Lymph: No lymphadenopathy of posterior or anterior cervical chain or axillae bilaterally.  Gait antalgic MSK:  tender with limited range of motion and good stability and symmetric strength and tone of shoulders, elbows, wrist, hip, and ankles bilaterally.  Knee: Left valgus deformity noted. Large thigh to calf ratio.  Tender to palpation over medial and PF joint line.  ROM full in flexion and extension and lower leg rotation. instability with valgus force.  painful patellar compression. Patellar glide with moderate crepitus. Patellar and quadriceps tendons unremarkable. Hamstring and quadriceps strength is normal. Contralateral knee shows mild arthritic changes as well  After informed written and verbal consent, patient was seated on exam table. Left knee was prepped with alcohol swab and utilizing anterolateral approach, patient's left knee space was injected with 22 mg/mL of Monovisc (sodium hyaluronate) in a prefilled syringe was injected easily into the knee through a 22-gauge needle..Patient tolerated the procedure well without immediate complications.   Impression and Recommendations:     This case required medical decision making of moderate complexity.      Note: This dictation was prepared with Dragon dictation along with smaller phrase technology. Any transcriptional errors that result from this process are unintentional.

## 2017-06-25 NOTE — Patient Instructions (Signed)
Good to see you  Ann Booth is your friend. Ice 20 minutes 2 times daily. Usually after activity and before bed. I hope this injection helps but will take some time.  Should get better in a couple days and continue to improve slowly over 4 weeks See me again in 6 weeks

## 2017-07-13 DIAGNOSIS — H524 Presbyopia: Secondary | ICD-10-CM | POA: Diagnosis not present

## 2017-07-13 DIAGNOSIS — H52203 Unspecified astigmatism, bilateral: Secondary | ICD-10-CM | POA: Diagnosis not present

## 2017-07-24 DIAGNOSIS — H401112 Primary open-angle glaucoma, right eye, moderate stage: Secondary | ICD-10-CM | POA: Diagnosis not present

## 2017-07-24 DIAGNOSIS — H401123 Primary open-angle glaucoma, left eye, severe stage: Secondary | ICD-10-CM | POA: Diagnosis not present

## 2017-08-06 ENCOUNTER — Ambulatory Visit: Payer: PPO | Admitting: Family Medicine

## 2017-08-18 ENCOUNTER — Telehealth: Payer: Self-pay | Admitting: Cardiology

## 2017-08-18 NOTE — Telephone Encounter (Signed)
New message    Pt is calling with some questions in regards to her medication. Please call.

## 2017-08-18 NOTE — Telephone Encounter (Addendum)
Attempt to reach.Line just rings-cc

## 2017-08-19 NOTE — Telephone Encounter (Signed)
Returned call to patient of Dr. Jens Som. She is calling to see what she can take for pain with her being on xarelto. Advised OK to take tylenol as directed. She states she does not feel like this works very well for her and also that she thinks this messes with her vision/glaucoma in the afternoons. Advised she contact her PCP for additional pain options if tylenol does not work well for her.

## 2017-09-07 DIAGNOSIS — H401123 Primary open-angle glaucoma, left eye, severe stage: Secondary | ICD-10-CM | POA: Diagnosis not present

## 2017-09-07 DIAGNOSIS — H401112 Primary open-angle glaucoma, right eye, moderate stage: Secondary | ICD-10-CM | POA: Diagnosis not present

## 2017-09-14 ENCOUNTER — Ambulatory Visit: Payer: PPO | Admitting: Internal Medicine

## 2017-09-17 ENCOUNTER — Ambulatory Visit: Payer: PPO | Admitting: Internal Medicine

## 2017-09-24 DIAGNOSIS — L720 Epidermal cyst: Secondary | ICD-10-CM | POA: Diagnosis not present

## 2017-10-05 ENCOUNTER — Ambulatory Visit (INDEPENDENT_AMBULATORY_CARE_PROVIDER_SITE_OTHER): Payer: PPO | Admitting: Internal Medicine

## 2017-10-05 ENCOUNTER — Encounter: Payer: Self-pay | Admitting: Internal Medicine

## 2017-10-05 VITALS — BP 116/78 | HR 64 | Temp 97.9°F | Ht 62.0 in | Wt 124.0 lb

## 2017-10-05 DIAGNOSIS — J45909 Unspecified asthma, uncomplicated: Secondary | ICD-10-CM

## 2017-10-05 DIAGNOSIS — G8929 Other chronic pain: Secondary | ICD-10-CM

## 2017-10-05 DIAGNOSIS — M545 Low back pain: Secondary | ICD-10-CM | POA: Diagnosis not present

## 2017-10-05 DIAGNOSIS — I10 Essential (primary) hypertension: Secondary | ICD-10-CM | POA: Diagnosis not present

## 2017-10-05 MED ORDER — LIDOCAINE 5 % EX PTCH: 1.0000 | MEDICATED_PATCH | CUTANEOUS | 1 refills | Status: AC

## 2017-10-05 MED ORDER — ACETAMINOPHEN-CODEINE #3 300-30 MG PO TABS: 1.0000 | ORAL_TABLET | Freq: Three times a day (TID) | ORAL | 0 refills | Status: DC | PRN

## 2017-10-05 NOTE — Patient Instructions (Signed)
Please take all new medication as prescribed - the tylenol with codeine as needed, as well as the pain patch as needed  Please continue all other medications as before, and refills have been done if requested.  Please have the pharmacy call with any other refills you may need.  Please continue your efforts at being more active, low cholesterol diet, and weight control.  Please keep your appointments with your specialists as you may have planned  Please return in 6 months, or sooner if needed, with Lab testing done 3-5 days before

## 2017-10-05 NOTE — Assessment & Plan Note (Signed)
stable overall by history and exam, and pt to continue medical treatment as before,  to f/u any worsening symptoms or concerns 

## 2017-10-05 NOTE — Assessment & Plan Note (Signed)
stable overall by history and exam, recent data reviewed with pt, and pt to continue medical treatment as before,  to f/u any worsening symptoms or concerns BP Readings from Last 3 Encounters:  10/05/17 116/78  06/25/17 118/80  06/11/17 118/72

## 2017-10-05 NOTE — Progress Notes (Signed)
Subjective:    Patient ID: Ann Booth, female    DOB: 1926/11/04, 84 y.o.   MRN: [id number]  HPI  Here to f/u; overall doing ok,  Pt denies chest pain, increasing sob or doe, wheezing, orthopnea, PND, increased LE swelling, palpitations, dizziness or syncope.  Pt denies new neurological symptoms such as new headache, or facial or extremity weakness or numbness.  Pt denies polydipsia, polyuria, or low sugar episode.  Pt states overall good compliance with meds, mostly trying to follow appropriate diet, with wt overall stable,  but little exercise however. Does c/o off balance worsening with gait, fell July 4 trying to get to the phone without her cane, slid hard down a kitech appliance and hit the floor, backside still numb and aches since them. No other new complaints  Pt continues to have recurring LBP without change in severity, bowel or bladder change, fever, wt loss,  worsening LE pain/numbness/weakness, gait change or falls, but has worsening kyphoscoliois with pain Past Medical History:  Diagnosis Date  . ALLERGIC RHINITIS   . ANXIETY   . ASTHMA   . Atrial fibrillation (HCC)   . BACK PAIN   . Carotid stenosis 04/27/2012   Mild left on community screening  - Nov 2013  . CATARACT NOS   . DEPRESSION   . GLAUCOMA NOS   . HTN (hypertension)   . Hypercalcemia   . MITRAL VALVE PROLAPSE   . OSTEOPOROSIS   . PERIPHERAL NEUROPATHY   . POSITIONAL VERTIGO   . Rosacea    Past Surgical History:  Procedure Laterality Date  .  ankle surgury     left ankle  . CATARACT EXTRACTION, BILATERAL    . HEMORRHOID SURGERY    . posterior chamber interocular lens implant      reports that she has never smoked. She has never used smokeless tobacco. She reports that she does not drink alcohol or use drugs. family history includes CAD in her mother; Cancer in her brother and sister. Allergies  Allergen Reactions  . Cymbalta [Duloxetine Hcl] Other (See Comments)   Current Outpatient Medications on  File Prior to Visit  Medication Sig Dispense Refill  . ALPRAZolam (XANAX) 0.5 MG tablet 1/2 - 1 tab by mouth at bedtime as needed 90 tablet 1  . b complex vitamins tablet Take 1 tablet by mouth daily.    . Biotin 1 MG CAPS Take 1 tablet by mouth daily.    . Calcium Carbonate-Vitamin D (CALCIUM + D PO) Take 1 tablet by mouth daily.     . Coenzyme Q10 (COQ-10) 100 MG CAPS Take 100 mg by mouth daily.     . diclofenac sodium (VOLTAREN) 1 % GEL Apply 4 g topically 4 (four) times daily as needed. 200 g 11  . glucosamine-chondroitin 500-400 MG tablet Take 1 tablet by mouth 2 (two) times daily.    . IRON, FERROUS GLUCONATE, PO Take 1 tablet by mouth 2 (two) times a week.     . losartan (COZAAR) 100 MG tablet TAKE 1 TABLET BY MOUTH  DAILY 90 tablet 3  . LUTEIN PO Take 1 tablet by mouth daily.    . Magnesium Citrate 100 MG TABS Take 100 mg by mouth daily.     . Multiple Vitamins-Minerals (MACULAR VITAMIN BENEFIT PO) Take 2 tablets by mouth daily.    . Multiple Vitamins-Minerals (MULTIVITAMIN & MINERAL PO) Take 1 tablet by mouth daily.    . Rivaroxaban (XARELTO) 15 MG TABS tablet Take 1  tablet (15 mg total) by mouth daily with supper. 90 tablet 1  . Thiamine HCl (B-1 PO) Take 1 tablet by mouth daily.     . timolol (BETIMOL) 0.5 % ophthalmic solution Place 1 drop into both eyes daily.    . vitamin B-12 (CYANOCOBALAMIN) 1000 MCG tablet Take 1,000 mcg by mouth every other day.      No current facility-administered medications on file prior to visit.    Review of Systems  Constitutional: Negative for other unusual diaphoresis or sweats HENT: Negative for ear discharge or swelling Eyes: Negative for other worsening visual disturbances Respiratory: Negative for stridor or other swelling  Gastrointestinal: Negative for worsening distension or other blood Genitourinary: Negative for retention or other urinary change Musculoskeletal: Negative for other MSK pain or swelling Skin: Negative for color change  or other new lesions Neurological: Negative for worsening tremors and other numbness  Psychiatric/Behavioral: Negative for worsening agitation or other fatigue All other system neg per pt    Objective:   Physical Exam BP 116/78   Pulse 64   Temp 97.9 F (36.6 C) (Oral)   Ht 5\' 2"  (1.575 m)   Wt 124 lb (56.2 kg)   SpO2 95%   BMI 22.68 kg/m  VS noted,  Constitutional: Pt appears in NAD HENT: Head: NCAT.  Right Ear: External ear normal.  Left Ear: External ear normal.  Eyes: . Pupils are equal, round, and reactive to light. Conjunctivae and EOM are normal Nose: without d/c or deformity Neck: Neck supple. Gross normal ROM Cardiovascular: Normal rate and regular rhythm.   Pulmonary/Chest: Effort normal and breath sounds without rales or wheezing.  Abd:  Soft, NT, ND, + BS, no organomegaly Neurological: Pt is alert. At baseline orientation, motor grossly intact Skin: Skin is warm. No rashes, other new lesions, no LE edema Psychiatric: Pt behavior is normal without agitation  No other exam findings Lab Results  Component Value Date   WBC 3.4 06/02/2017   HGB 12.8 06/02/2017   HCT 39.9 06/02/2017   PLT 194 06/02/2017   GLUCOSE 89 06/02/2017   CHOL 181 03/16/2017   TRIG 122.0 03/16/2017   HDL 78.90 03/16/2017   LDLCALC 77 03/16/2017   ALT 16 03/16/2017   AST 26 03/16/2017   NA 144 06/02/2017   K 4.8 06/02/2017   CL 105 06/02/2017   CREATININE 0.70 06/02/2017   BUN 13 06/02/2017   CO2 27 06/02/2017   TSH 0.88 03/16/2017   INR 1.09 01/24/2013       Assessment & Plan:

## 2017-10-05 NOTE — Assessment & Plan Note (Signed)
Gradually worsening, has seen ortho, ok for tylenol #3 prn, and lidoderm asd,  to f/u any worsening symptoms or concerns

## 2017-10-23 ENCOUNTER — Other Ambulatory Visit: Payer: Self-pay

## 2017-10-23 ENCOUNTER — Emergency Department (HOSPITAL_COMMUNITY): Payer: PPO

## 2017-10-23 ENCOUNTER — Emergency Department (HOSPITAL_COMMUNITY)
Admission: EM | Admit: 2017-10-23 | Discharge: 2017-10-23 | Disposition: A | Discharge status: Home / Self Care | Payer: PPO | Attending: Emergency Medicine | Admitting: Emergency Medicine

## 2017-10-23 DIAGNOSIS — J45909 Unspecified asthma, uncomplicated: Secondary | ICD-10-CM | POA: Insufficient documentation

## 2017-10-23 DIAGNOSIS — R0789 Other chest pain: Secondary | ICD-10-CM | POA: Diagnosis not present

## 2017-10-23 DIAGNOSIS — R079 Chest pain, unspecified: Secondary | ICD-10-CM | POA: Diagnosis not present

## 2017-10-23 DIAGNOSIS — R072 Precordial pain: Secondary | ICD-10-CM | POA: Insufficient documentation

## 2017-10-23 DIAGNOSIS — Z79899 Other long term (current) drug therapy: Secondary | ICD-10-CM | POA: Insufficient documentation

## 2017-10-23 DIAGNOSIS — I4891 Unspecified atrial fibrillation: Secondary | ICD-10-CM | POA: Diagnosis not present

## 2017-10-23 DIAGNOSIS — R531 Weakness: Secondary | ICD-10-CM | POA: Diagnosis not present

## 2017-10-23 DIAGNOSIS — R000 Tachycardia, unspecified: Secondary | ICD-10-CM | POA: Diagnosis not present

## 2017-10-23 LAB — BASIC METABOLIC PANEL
Anion gap: 15 (ref 5–15)
BUN: 14 mg/dL (ref 8–23)
CALCIUM: 10 mg/dL (ref 8.9–10.3)
CO2: 22 mmol/L (ref 22–32)
Chloride: 104 mmol/L (ref 98–111)
Creatinine, Ser: 0.72 mg/dL (ref 0.44–1.00)
GFR calc Af Amer: >60 mL/min (ref >60)
GFR calc non Af Amer: >60 mL/min (ref >60)
GLUCOSE: 103 mg/dL — AB (ref 70–99)
Potassium: 3.8 mmol/L (ref 3.5–5.1)
SODIUM: 141 mmol/L (ref 135–145)

## 2017-10-23 LAB — CBC
HCT: 46.2 % — ABNORMAL HIGH (ref 36.0–46.0)
HEMOGLOBIN: 15 g/dL (ref 12.0–15.0)
MCH: 32.6 pg (ref 26.0–34.0)
MCHC: 32.5 g/dL (ref 30.0–36.0)
MCV: 100.4 fL — ABNORMAL HIGH (ref 78.0–100.0)
Platelets: 179 10*3/uL (ref 150–400)
RBC: 4.6 MIL/uL (ref 3.87–5.11)
RDW: 13.2 % (ref 11.5–15.5)
WBC: 5.7 10*3/uL (ref 4.0–10.5)

## 2017-10-23 LAB — TROPONIN I: Troponin I: <0.03 ng/mL (ref <0.03)

## 2017-10-23 MED ORDER — METOPROLOL TARTRATE 5 MG/5ML IV SOLN
5.0000 mg | Freq: Once | INTRAVENOUS | Status: AC
  Administered 2017-10-23: 5 mg via INTRAVENOUS
  Filled 2017-10-23: qty 5

## 2017-10-23 NOTE — ED Triage Notes (Signed)
Pt arrives with c/o chest discomfort, non-descriptive of pain.  324 of asprin releives discomfort. Endorses SOB, hx of Afib and anxiety.  Per [facility], in Afib on arrival, BP150/92; Pulse 96; RR 18; Spo02 97% on RA.  CBG 144

## 2017-10-24 NOTE — ED Provider Notes (Signed)
[facility] EMERGENCY DEPARTMENT Provider Note   CSN: [id number] Arrival date & time: 10/23/17  1658     History   Chief Complaint Chief Complaint  Patient presents with  . Chest Pain    HPI Ann Booth is a 84 y.o. female.  HPI 84 year old female with no history of prior coronary artery disease presents to the emergency department with nonspecific anterior chest discomfort this morning which lasted for 1 to 2 hours.  This resolved prior to arrival in the emergency department.  She is currently without symptoms.  It she was given aspirin prior to arrival.  She had transient shortness of breath associated with it.  She does have a history of A. fib.  EMS reports A. fib on arrival.  No recent fevers or chills.  No productive cough.  No unilateral leg swelling.  No history of DVT or pulmonary embolism.   Past Medical History:  Diagnosis Date  . ALLERGIC RHINITIS   . ANXIETY   . ASTHMA   . Atrial fibrillation (HCC)   . BACK PAIN   . Carotid stenosis 04/27/2012   Mild left on community screening  - Nov 2013  . CATARACT NOS   . DEPRESSION   . GLAUCOMA NOS   . HTN (hypertension)   . Hypercalcemia   . MITRAL VALVE PROLAPSE   . OSTEOPOROSIS   . PERIPHERAL NEUROPATHY   . POSITIONAL VERTIGO   . Rosacea     Patient Active Problem List   Diagnosis Date Noted  . Degenerative arthritis of left knee 06/25/2017  . Peripheral vascular disease (HCC) 05/21/2017  . Rib pain 04/24/2017  . Chest pain 03/16/2017  . Callus of foot 05/26/2016  . Right foot pain 03/21/2016  . Allergic conjunctivitis 03/21/2016  . Insomnia 07/28/2013  . Arthritis of sacroiliac joint 03/30/2013  . Leg length discrepancy 03/30/2013  . N&V (nausea and vomiting) 01/27/2013  . Atrial fibrillation (HCC) 11/01/2012  . Chronic pain of left knee 10/08/2012  . Conjunctivitis 07/21/2012  . Carotid stenosis 04/27/2012  . Chronic lower back pain 04/27/2012  . Idiopathic scoliosis 04/27/2012    . Black stools 10/25/2011  . HTN (hypertension) 08/23/2010  . Encounter for well adult exam with abnormal findings 07/25/2010  . Peripheral edema 07/25/2010  . Hypercalcemia 11/09/2008  . POSITIONAL VERTIGO 11/09/2008  . BACK PAIN 11/09/2008  . Peripheral neuropathic pain 06/01/2007  . Allergic rhinitis 06/01/2007  . SHOULDER PAIN, BILATERAL 06/01/2007  . FATIGUE 06/01/2007  . Hyperlipidemia 10/18/2006  . Anxiety state 10/18/2006  . Depression 10/18/2006  . GLAUCOMA NOS 10/15/2006  . CATARACT NOS 10/15/2006  . Mitral valve disorder 10/15/2006  . Asthma 10/15/2006  . Rosacea 10/15/2006  . Osteoporosis 10/15/2006    Past Surgical History:  Procedure Laterality Date  .  ankle surgury     left ankle  . CATARACT EXTRACTION, BILATERAL    . HEMORRHOID SURGERY    . posterior chamber interocular lens implant       OB History   None      Home Medications    Prior to Admission medications   Medication Sig Start Date End Date Taking? Authorizing Provider  acetaminophen-codeine (TYLENOL #3) 300-30 MG tablet Take 1 tablet by mouth every 8 (eight) hours as needed for moderate pain. 10/05/17   Corwin Levins, MD  ALPRAZolam Prudy Feeler) 0.5 MG tablet 1/2 - 1 tab by mouth at bedtime as needed 03/16/17   Corwin Levins, MD  b complex vitamins tablet  Take 1 tablet by mouth daily.    [provider]  Biotin 1 MG CAPS Take 1 tablet by mouth daily.    [provider]  Calcium Carbonate-Vitamin D (CALCIUM + D PO) Take 1 tablet by mouth daily.     [provider]  Coenzyme Q10 (COQ-10) 100 MG CAPS Take 100 mg by mouth daily.     [provider]  diclofenac sodium (VOLTAREN) 1 % GEL Apply 4 g topically 4 (four) times daily as needed. 03/16/17   Corwin Levins, MD  glucosamine-chondroitin 500-400 MG tablet Take 1 tablet by mouth 2 (two) times daily.    [provider]  IRON, FERROUS GLUCONATE, PO Take 1 tablet by mouth 2 (two) times a week.     [provider]  lidocaine (LIDODERM) 5 % Place 1 patch onto the skin daily. Remove & Discard patch within 12 hours or as directed by MD 10/05/17   Corwin Levins, MD  losartan (COZAAR) 100 MG tablet TAKE 1 TABLET BY MOUTH  DAILY 04/20/17   Corwin Levins, MD  LUTEIN PO Take 1 tablet by mouth daily.    [provider]  Magnesium Citrate 100 MG TABS Take 100 mg by mouth daily.     [provider]  Multiple Vitamins-Minerals (MACULAR VITAMIN BENEFIT PO) Take 2 tablets by mouth daily.    [provider]  Multiple Vitamins-Minerals (MULTIVITAMIN & MINERAL PO) Take 1 tablet by mouth daily.    [provider]  Rivaroxaban (XARELTO) 15 MG TABS tablet Take 1 tablet (15 mg total) by mouth daily with supper. 02/25/17   Lewayne Bunting, MD  Thiamine HCl (B-1 PO) Take 1 tablet by mouth daily.     [provider]  timolol (BETIMOL) 0.5 % ophthalmic solution Place 1 drop into both eyes daily.    [provider]  vitamin B-12 (CYANOCOBALAMIN) 1000 MCG tablet Take 1,000 mcg by mouth every other day.     [provider]    Family History Family History  Problem Relation Age of Onset  . CAD Mother        MI at age 35  . Cancer Sister        colon  . Cancer Brother        prostate cancer    Social History Social History   Tobacco Use  . Smoking status: Never Smoker  . Smokeless tobacco: Never Used  Substance Use Topics  . Alcohol use: No  . Drug use: No     Allergies   Cymbalta [duloxetine hcl]   Review of Systems Review of Systems  All other systems reviewed and are negative.    Physical Exam Updated Vital Signs BP 131/87 (BP Location: Right Arm)   Pulse (!) 16   Temp 97.6 F (36.4 C)   Resp 15   Ht 5\' 2"  (1.575 m)   Wt 58.5 kg   SpO2 100%   BMI 23.59 kg/m   Physical Exam  Constitutional: She is oriented to person, place, and time. She appears well-developed and well-nourished. No distress.  HENT:  Head:  Normocephalic and atraumatic.  Eyes: EOM are normal.  Neck: Normal range of motion.  Cardiovascular: Normal rate, regular rhythm and normal heart sounds.  Pulmonary/Chest: Effort normal and breath sounds normal.  Abdominal: Soft. She exhibits no distension. There is no tenderness.  Musculoskeletal: Normal range of motion.  Neurological: She is alert and oriented to person, place, and time.  Skin: Skin is warm and dry.  Psychiatric: She has a normal mood and affect. Judgment normal.  Nursing note and vitals reviewed.    ED Treatments / Results  Labs (all labs ordered are listed, but only abnormal results are displayed) Labs Reviewed  BASIC METABOLIC PANEL - Abnormal; Notable for the following components:      Result Value   Glucose, Bld 103 (*)    All other components within normal limits  CBC - Abnormal; Notable for the following components:   HCT 46.2 (*)    MCV 100.4 (*)    All other components within normal limits  TROPONIN I    EKG EKG Interpretation  Date/Time:  Friday October 23 2017 17:08:20 EDT Ventricular Rate:  92 PR Interval:    QRS Duration: 86 QT Interval:  343 QTC Calculation: 425 R Axis:     Text Interpretation:  Atrial fibrillation Abnormal R-wave progression, early transition new afib as compared to last ecg Confirmed by Azalia Bilis ([id number]) on 10/23/2017 5:26:01 PM   Radiology Dg Chest 2 View  Result Date: 10/23/2017 CLINICAL DATA:  Atrial fibrillation EXAM: CHEST - 2 VIEW COMPARISON:  03/16/2017 and 04/23/2017 CXR FINDINGS: Dextroconvex scoliosis of the lower thoracic spine. Heart size is top-normal with tortuous atherosclerotic aorta. Emphysematous hyperinflation of the lungs without alveolar consolidation or CHF. No effusion or pneumothorax. Osteopenic appearance of the thoracic spine without acute osseous abnormality. IMPRESSION: COPD. No active pulmonary disease. Osteopenia with mild degenerative change along the thoracic spine. Electronically Signed    By: Tollie Eth M.D.   On: 10/23/2017 18:22    Procedures Procedures (including critical care time)  Medications Ordered in ED Medications  metoprolol tartrate (LOPRESSOR) injection 5 mg (5 mg Intravenous Given 10/23/17 1919)     Initial Impression / Assessment and Plan / ED Course  I have reviewed the triage vital signs and the nursing notes.  Pertinent labs & imaging results that were available during my care of the patient were reviewed by me and considered in my medical decision making (see chart for details).     Overall well-appearing.  EKG without ischemic changes.  Troponin negative here in the emergency department.  Currently asymptomatic.  Primary care and outpatient cardiology follow-up.  Patient understands return to the ER for new or worsening symptoms.  Suspect gastroesophageal reflux disease.  Final Clinical Impressions(s) / ED Diagnoses   Final diagnoses:  Precordial chest pain    ED Discharge Orders    None       Azalia Bilis, MD 10/24/17 [phone]

## 2017-11-30 ENCOUNTER — Ambulatory Visit: Payer: Self-pay | Admitting: *Deleted

## 2017-11-30 NOTE — Telephone Encounter (Signed)
Pt reports non-productive cough x 2-3 months. Worsening past 2 nights. Had been present only at night, now intermittent during day. Pt reports mild SOB with coughing. Denies CP, fever, wheezing. Has not taken anything for cough. States is staying hydrated. H/O a-fib, HTN. Appt made with Pollie Friar for tomorrow , within 24hrs, per protocol. Care advise given, pt verbalizes understanding.  Reason for Disposition . [1] Continuous (nonstop) coughing interferes with work or school AND [2] no improvement using cough treatment per protocol    Intermittent. Disposition as pt is 89. Mild SOB with coughing.  Answer Assessment - Initial Assessment Questions 1. ONSET: "When did the cough begin?"      2-3 months ago, worsening past 2 days 2. SEVERITY: "How bad is the cough today?"      Mostly at night, intermittent during day starting yesterday 3. RESPIRATORY DISTRESS: "Describe your breathing."      SOB with exertion, mild 4. FEVER: "Do you have a fever?" If so, ask: "What is your temperature, how was it measured, and when did it start?"     no 5. HEMOPTYSIS: "Are you coughing up any blood?" If so ask: "How much?" (flecks, streaks, tablespoons, etc.)     no 6. TREATMENT: "What have you done so far to treat the cough?" (e.g., meds, fluids, humidifier)     "Nothing" 7. CARDIAC HISTORY: "Do you have any history of heart disease?" (e.g., heart attack, congestive heart failure)      Afib, HTN 8. LUNG HISTORY: "Do you have any history of lung disease?"  (e.g., pulmonary embolus, asthma, emphysema)     Asthma 2008 9. PE RISK FACTORS: "Do you have a history of blood clots?" (or: recent major surgery, recent prolonged travel, bedridden)     no 10. OTHER SYMPTOMS: "Do you have any other symptoms? (e.g., runny nose, wheezing, chest pain)       no  Protocols used: COUGH - ACUTE NON-PRODUCTIVE-A-AH

## 2017-12-01 ENCOUNTER — Encounter: Payer: Self-pay | Admitting: Family

## 2017-12-01 ENCOUNTER — Other Ambulatory Visit: Payer: PPO

## 2017-12-01 ENCOUNTER — Ambulatory Visit (INDEPENDENT_AMBULATORY_CARE_PROVIDER_SITE_OTHER): Payer: PPO | Admitting: Family

## 2017-12-01 ENCOUNTER — Ambulatory Visit (INDEPENDENT_AMBULATORY_CARE_PROVIDER_SITE_OTHER)
Admission: RE | Admit: 2017-12-01 | Discharge: 2017-12-01 | Disposition: A | Discharge status: Home / Self Care | Payer: PPO | Source: Ambulatory Visit | Attending: Family | Admitting: Family

## 2017-12-01 VITALS — BP 118/76 | HR 99 | Temp 97.5°F | Ht 62.0 in | Wt 128.0 lb

## 2017-12-01 DIAGNOSIS — R079 Chest pain, unspecified: Secondary | ICD-10-CM | POA: Diagnosis not present

## 2017-12-01 DIAGNOSIS — R05 Cough: Secondary | ICD-10-CM

## 2017-12-01 DIAGNOSIS — R059 Cough, unspecified: Secondary | ICD-10-CM

## 2017-12-01 NOTE — Progress Notes (Signed)
Ann Booth is a 84 y.o. female with the following history as recorded in Epic[employer]:  Patient Active Problem List   Diagnosis Date Noted  . Degenerative arthritis of left knee 06/25/2017  . Peripheral vascular disease ([place]) 05/21/2017  . Rib pain 04/24/2017  . Chest pain 03/16/2017  . Callus of foot 05/26/2016  . Right foot pain 03/21/2016  . Allergic conjunctivitis 03/21/2016  . Insomnia 07/28/2013  . Arthritis of sacroiliac joint 03/30/2013  . Leg length discrepancy 03/30/2013  . N&V (nausea and vomiting) 01/27/2013  . Atrial fibrillation ([place]) 11/01/2012  . Chronic pain of left knee 10/08/2012  . Conjunctivitis 07/21/2012  . Carotid stenosis 04/27/2012  . Chronic lower back pain 04/27/2012  . Idiopathic scoliosis 04/27/2012  . Black stools 10/25/2011  . HTN (hypertension) 08/23/2010  . Encounter for well adult exam with abnormal findings 07/25/2010  . Peripheral edema 07/25/2010  . Hypercalcemia 11/09/2008  . POSITIONAL VERTIGO 11/09/2008  . BACK PAIN 11/09/2008  . Peripheral neuropathic pain 06/01/2007  . Allergic rhinitis 06/01/2007  . SHOULDER PAIN, BILATERAL 06/01/2007  . FATIGUE 06/01/2007  . Hyperlipidemia 10/18/2006  . Anxiety state 10/18/2006  . Depression 10/18/2006  . GLAUCOMA NOS 10/15/2006  . CATARACT NOS 10/15/2006  . Mitral valve disorder 10/15/2006  . Asthma 10/15/2006  . Rosacea 10/15/2006  . Osteoporosis 10/15/2006    Current Outpatient Medications  Medication Sig Dispense Refill  . acetaminophen-codeine (TYLENOL #3) 300-30 MG tablet Take 1 tablet by mouth every 8 (eight) hours as needed for moderate pain. 21 tablet 0  . ALPRAZolam (XANAX) 0.5 MG tablet 1/2 - 1 tab by mouth at bedtime as needed 90 tablet 1  . b complex vitamins tablet Take 1 tablet by mouth daily.    . Biotin 1 MG CAPS Take 1 tablet by mouth daily.    . Calcium Carbonate-Vitamin D (CALCIUM + D PO) Take 1 tablet by mouth daily.     . Coenzyme Q10 (COQ-10) 100 MG CAPS Take 100  mg by mouth daily.     . diclofenac sodium (VOLTAREN) 1 % GEL Apply 4 g topically 4 (four) times daily as needed. 200 g 11  . FLUAD 0.5 ML SUSY ADM 0.5ML IM UTD  0  . glucosamine-chondroitin 500-400 MG tablet Take 1 tablet by mouth 2 (two) times daily.    . IRON, FERROUS GLUCONATE, PO Take 1 tablet by mouth 2 (two) times a week.     . lidocaine (LIDODERM) 5 % Place 1 patch onto the skin daily. Remove & Discard patch within 12 hours or as directed by MD 60 patch 1  . losartan (COZAAR) 100 MG tablet TAKE 1 TABLET BY MOUTH  DAILY 90 tablet 3  . LUTEIN PO Take 1 tablet by mouth daily.    . Magnesium Citrate 100 MG TABS Take 100 mg by mouth daily.     . Multiple Vitamins-Minerals (MACULAR VITAMIN BENEFIT PO) Take 2 tablets by mouth daily.    . Multiple Vitamins-Minerals (MULTIVITAMIN & MINERAL PO) Take 1 tablet by mouth daily.    . Rivaroxaban (XARELTO) 15 MG TABS tablet Take 1 tablet (15 mg total) by mouth daily with supper. 90 tablet 1  . Thiamine HCl (B-1 PO) Take 1 tablet by mouth daily.     . timolol (BETIMOL) 0.5 % ophthalmic solution Place 1 drop into both eyes daily.    . timolol (TIMOPTIC) 0.5 % ophthalmic solution PLACE 1 DROP INTO BOTH EYES TWO TIMES DAILY  11  . vitamin B-12 (  CYANOCOBALAMIN) 1000 MCG tablet Take 1,000 mcg by mouth every other day.      No current facility-administered medications for this visit.     Allergies: Cymbalta [duloxetine hcl]  Past Medical History:  Diagnosis Date  . ALLERGIC RHINITIS   . ANXIETY   . ASTHMA   . Atrial fibrillation (HCC)   . BACK PAIN   . Carotid stenosis 04/27/2012   Mild left on community screening  - Nov 2013  . CATARACT NOS   . DEPRESSION   . GLAUCOMA NOS   . HTN (hypertension)   . Hypercalcemia   . MITRAL VALVE PROLAPSE   . OSTEOPOROSIS   . PERIPHERAL NEUROPATHY   . POSITIONAL VERTIGO   . Rosacea     Past Surgical History:  Procedure Laterality Date  .  ankle surgury     left ankle  . CATARACT EXTRACTION, BILATERAL    .  HEMORRHOID SURGERY    . posterior chamber interocular lens implant      Family History  Problem Relation Age of Onset  . CAD Mother        MI at age 32  . Cancer Sister        colon  . Cancer Brother        prostate cancer    Social History   Tobacco Use  . Smoking status: Never Smoker  . Smokeless tobacco: Never Used  Substance Use Topics  . Alcohol use: No    Subjective:  Patient presents with concerns for cough x 2-3 months; most noticeable at bed time; used Tussin (OTC) last night with good relief of the symptoms; had CXR done at the end of August which showed COPD changes/ per patient, she had asthma in the past; history of A. Fib- under care of cardiology; denies any fever, occasional wheezing- feels like she can't get air in easily; no fever, cough is not productive; describes as a dry cough; no night sweats; no weight loss;     Objective:  Vitals:   12/01/17 1039  BP: 118/76  Pulse: 99  Temp: (!) 97.5 F (36.4 C)  TempSrc: Oral  SpO2: 98%  Weight: 128 lb 0.6 oz (58.1 kg)  Height: 5\' 2"  (1.575 m)    General: Well developed, well nourished, in no acute distress  Skin : Warm and dry.  Head: Normocephalic and atraumatic  Eyes: Sclera and conjunctiva clear; pupils round and reactive to light; extraocular movements intact  Ears: External normal; canals clear; tympanic membranes normal  Oropharynx: Pink, supple. No suspicious lesions  Neck: Supple without thyromegaly, adenopathy  Lungs: Respirations unlabored; clear to auscultation bilaterally without wheeze, rales, rhonchi  CVS exam: normal rate and regular rhythm.  Neurologic: Alert and oriented; speech intact; face symmetrical; moves all extremities well; CNII-XII intact without focal deficit   Assessment:  1. Cough     Plan:  Physical exam is reassuring; will repeat CXR to compare to image done at the end of August; trial of BREO 200 mg ( this is what sample strength was available); recommend to follow-up with  her PCP in 2 weeks/ keep upcoming appointment with her cardiologist.   No follow-ups on file.  Orders Placed This Encounter  Procedures  . DG Chest 2 View    Standing Status:   Future    Number of Occurrences:   1    Standing Expiration Date:   02/01/2019    Order Specific Question:   Reason for Exam (SYMPTOM  OR DIAGNOSIS REQUIRED)  Answer:   cough x 2-3 months    Order Specific Question:   Preferred imaging location?    Answer:   [facility]    Order Specific Question:   Radiology Contrast Protocol - do NOT remove file path    Answer:   [email or web address]    Requested Prescriptions    No prescriptions requested or ordered in this encounter

## 2017-12-07 ENCOUNTER — Ambulatory Visit: Payer: Self-pay | Admitting: *Deleted

## 2017-12-07 NOTE — Telephone Encounter (Signed)
Pt called in c/o continued shortness of breath, non productive cough and feeling full in chest.   Per pt she was seen for this by Ria Clock, NP and given [facility]  But it has not helped.    Pt feels like she may have asthma again.   She was diagnosed many years ago but has not had a problem with asthma since then many years ago.   This feels the same as when she was diagnosed.     I have scheduled her with Ria Clock, NP for 12/08/17 at 10:40 am.        Reason for Disposition . [1] MODERATE longstanding difficulty breathing (e.g., speaks in phrases, SOB even at rest, pulse 100-120) AND [2] SAME as normal    Had a non productive cough since beginning of Oct. That she has been seen for once.  Answer Assessment - Initial Assessment Questions 1. RESPIRATORY STATUS: "Describe your breathing?" (e.g., wheezing, shortness of breath, unable to speak, severe coughing)      Short of breath prior to Oct 8th.   My sister took me to Ria Clock.   She gave me Brevio.   I don't feel like that's helping much.   I was diagnosed with asthma a long time ago.  I've not had a problem with asthma since them. 2. ONSET: "When did this breathing problem begin?"      It started 3 days prior to Oct. 8th.    3. PATTERN "Does the difficult breathing come and go, or has it been constant since it started?"      My chest is heavy and hurting now.   It's not bad but it feels like something is there.   Non productive cough.   I cough a lot and wheeze.   Not wheezing now. 4. SEVERITY: "How bad is your breathing?" (e.g., mild, moderate, severe)    - MILD: No SOB at rest, mild SOB with walking, speaks normally in sentences, can lay down, no retractions, pulse < 100.    - MODERATE: SOB at rest, SOB with minimal exertion and prefers to sit, cannot lie down flat, speaks in phrases, mild retractions, audible wheezing, pulse 100-120.    - SEVERE: Very SOB at rest, speaks in single words, struggling to breathe, sitting hunched  forward, retractions, pulse > 120      If I'm in a hurry or anxious about something I am short of breath. 5. RECURRENT SYMPTOM: "Have you had difficulty breathing before?" If so, ask: "When was the last time?" and "What happened that time?"      Yes.   I feel filled up and full.   I just feel heavy. 6. CARDIAC HISTORY: "Do you have any history of heart disease?" (e.g., heart attack, angina, bypass surgery, angioplasty)      A.Fib.    7. LUNG HISTORY: "Do you have any history of lung disease?"  (e.g., pulmonary embolus, asthma, emphysema)     Asthma many years ago.   Denies COPD or blood clots in lungs. 8. CAUSE: "What do you think is causing the breathing problem?"      No.    9. OTHER SYMPTOMS: "Do you have any other symptoms? (e.g., dizziness, runny nose, cough, chest pain, fever)     My chest feels heavy.   I think it's from the asthma. 10. PREGNANCY: "Is there any chance you are pregnant?" "When was your last menstrual period?"       Not asked due to  age 1. TRAVEL: "Have you traveled out of the country in the last month?" (e.g., travel history, exposures)       No.  Protocols used: BREATHING DIFFICULTY-A-AH

## 2017-12-08 ENCOUNTER — Other Ambulatory Visit (INDEPENDENT_AMBULATORY_CARE_PROVIDER_SITE_OTHER): Payer: PPO

## 2017-12-08 ENCOUNTER — Ambulatory Visit (INDEPENDENT_AMBULATORY_CARE_PROVIDER_SITE_OTHER): Payer: PPO | Admitting: Family

## 2017-12-08 ENCOUNTER — Encounter: Payer: Self-pay | Admitting: Family

## 2017-12-08 VITALS — BP 122/76 | HR 91 | Temp 97.6°F | Ht 62.0 in | Wt 129.0 lb

## 2017-12-08 DIAGNOSIS — R109 Unspecified abdominal pain: Secondary | ICD-10-CM | POA: Diagnosis not present

## 2017-12-08 DIAGNOSIS — R05 Cough: Secondary | ICD-10-CM | POA: Diagnosis not present

## 2017-12-08 DIAGNOSIS — R059 Cough, unspecified: Secondary | ICD-10-CM

## 2017-12-08 DIAGNOSIS — R0602 Shortness of breath: Secondary | ICD-10-CM | POA: Diagnosis not present

## 2017-12-08 LAB — COMPREHENSIVE METABOLIC PANEL
ALBUMIN: 4 g/dL (ref 3.5–5.2)
ALK PHOS: 65 U/L (ref 39–117)
ALT: 20 U/L (ref 0–35)
AST: 24 U/L (ref 0–37)
BILIRUBIN TOTAL: 0.5 mg/dL (ref 0.2–1.2)
BUN: 15 mg/dL (ref 6–23)
CALCIUM: 9.9 mg/dL (ref 8.4–10.5)
CO2: 30 meq/L (ref 19–32)
CREATININE: 0.71 mg/dL (ref 0.40–1.20)
Chloride: 105 mEq/L (ref 96–112)
GFR: 82.24 mL/min (ref >60.00)
Glucose, Bld: 102 mg/dL — ABNORMAL HIGH (ref 70–99)
Potassium: 4.1 mEq/L (ref 3.5–5.1)
Sodium: 141 mEq/L (ref 135–145)
Total Protein: 7.1 g/dL (ref 6.0–8.3)

## 2017-12-08 LAB — CBC WITH DIFFERENTIAL/PLATELET
BASOS ABS: 0.1 10*3/uL (ref 0.0–0.1)
Basophils Relative: 1.7 % (ref 0.0–3.0)
EOS ABS: 0.1 10*3/uL (ref 0.0–0.7)
Eosinophils Relative: 3.4 % (ref 0.0–5.0)
HEMATOCRIT: 40 % (ref 36.0–46.0)
HEMOGLOBIN: 13.4 g/dL (ref 12.0–15.0)
LYMPHS PCT: 23.8 % (ref 12.0–46.0)
Lymphs Abs: 1 10*3/uL (ref 0.7–4.0)
MCHC: 33.5 g/dL (ref 30.0–36.0)
MCV: 97.6 fl (ref 78.0–100.0)
MONOS PCT: 11.8 % (ref 3.0–12.0)
Monocytes Absolute: 0.5 10*3/uL (ref 0.1–1.0)
NEUTROS ABS: 2.4 10*3/uL (ref 1.4–7.7)
NEUTROS PCT: 59.3 % (ref 43.0–77.0)
Platelets: 198 10*3/uL (ref 150.0–400.0)
RBC: 4.1 Mil/uL (ref 3.87–5.11)
RDW: 14 % (ref 11.5–15.5)
WBC: 4.1 10*3/uL (ref 4.0–10.5)

## 2017-12-08 MED ORDER — AZITHROMYCIN 250 MG PO TABS: ORAL_TABLET | ORAL | 0 refills | Status: DC

## 2017-12-08 MED ORDER — PREDNISONE 20 MG PO TABS: 20.0000 mg | ORAL_TABLET | Freq: Every day | ORAL | 0 refills | Status: DC

## 2017-12-08 MED ORDER — PANTOPRAZOLE SODIUM 20 MG PO TBEC: 20.0000 mg | DELAYED_RELEASE_TABLET | Freq: Every day | ORAL | 1 refills | Status: DC

## 2017-12-08 NOTE — Progress Notes (Signed)
Ann Booth is a 84 y.o. female with the following history as recorded in Epic[facility]:  Patient Active Problem List   Diagnosis Date Noted  . Degenerative arthritis of left knee 06/25/2017  . Peripheral vascular disease ([place]) 05/21/2017  . Rib pain 04/24/2017  . Chest pain 03/16/2017  . Callus of foot 05/26/2016  . Right foot pain 03/21/2016  . Allergic conjunctivitis 03/21/2016  . Insomnia 07/28/2013  . Arthritis of sacroiliac joint 03/30/2013  . Leg length discrepancy 03/30/2013  . N&V (nausea and vomiting) 01/27/2013  . Atrial fibrillation ([place]) 11/01/2012  . Chronic pain of left knee 10/08/2012  . Conjunctivitis 07/21/2012  . Carotid stenosis 04/27/2012  . Chronic lower back pain 04/27/2012  . Idiopathic scoliosis 04/27/2012  . Black stools 10/25/2011  . HTN (hypertension) 08/23/2010  . Encounter for well adult exam with abnormal findings 07/25/2010  . Peripheral edema 07/25/2010  . Hypercalcemia 11/09/2008  . POSITIONAL VERTIGO 11/09/2008  . BACK PAIN 11/09/2008  . Peripheral neuropathic pain 06/01/2007  . Allergic rhinitis 06/01/2007  . SHOULDER PAIN, BILATERAL 06/01/2007  . FATIGUE 06/01/2007  . Hyperlipidemia 10/18/2006  . Anxiety state 10/18/2006  . Depression 10/18/2006  . GLAUCOMA NOS 10/15/2006  . CATARACT NOS 10/15/2006  . Mitral valve disorder 10/15/2006  . Asthma 10/15/2006  . Rosacea 10/15/2006  . Osteoporosis 10/15/2006    Current Outpatient Medications  Medication Sig Dispense Refill  . acetaminophen-codeine (TYLENOL #3) 300-30 MG tablet Take 1 tablet by mouth every 8 (eight) hours as needed for moderate pain. 21 tablet 0  . ALPRAZolam (XANAX) 0.5 MG tablet 1/2 - 1 tab by mouth at bedtime as needed 90 tablet 1  . b complex vitamins tablet Take 1 tablet by mouth daily.    . Biotin 1 MG CAPS Take 1 tablet by mouth daily.    . Calcium Carbonate-Vitamin D (CALCIUM + D PO) Take 1 tablet by mouth daily.     . Coenzyme Q10 (COQ-10) 100 MG CAPS Take 100  mg by mouth daily.     . diclofenac sodium (VOLTAREN) 1 % GEL Apply 4 g topically 4 (four) times daily as needed. 200 g 11  . FLUAD 0.5 ML SUSY ADM 0.5ML IM UTD  0  . glucosamine-chondroitin 500-400 MG tablet Take 1 tablet by mouth 2 (two) times daily.    . IRON, FERROUS GLUCONATE, PO Take 1 tablet by mouth 2 (two) times a week.     . lidocaine (LIDODERM) 5 % Place 1 patch onto the skin daily. Remove & Discard patch within 12 hours or as directed by MD 60 patch 1  . losartan (COZAAR) 100 MG tablet TAKE 1 TABLET BY MOUTH  DAILY 90 tablet 3  . LUTEIN PO Take 1 tablet by mouth daily.    . Magnesium Citrate 100 MG TABS Take 100 mg by mouth daily.     . Multiple Vitamins-Minerals (MACULAR VITAMIN BENEFIT PO) Take 2 tablets by mouth daily.    . Multiple Vitamins-Minerals (MULTIVITAMIN & MINERAL PO) Take 1 tablet by mouth daily.    . Rivaroxaban (XARELTO) 15 MG TABS tablet Take 1 tablet (15 mg total) by mouth daily with supper. 90 tablet 1  . Thiamine HCl (B-1 PO) Take 1 tablet by mouth daily.     . timolol (BETIMOL) 0.5 % ophthalmic solution Place 1 drop into both eyes daily.    . timolol (TIMOPTIC) 0.5 % ophthalmic solution PLACE 1 DROP INTO BOTH EYES TWO TIMES DAILY  11  . vitamin B-12 (  CYANOCOBALAMIN) 1000 MCG tablet Take 1,000 mcg by mouth every other day.     Marland Kitchen azithromycin (ZITHROMAX) 250 MG tablet 2 tabs po qd x 1 day; 1 tablet per day x 4 days; 6 tablet 0  . pantoprazole (PROTONIX) 20 MG tablet Take 1 tablet (20 mg total) by mouth daily. 30 tablet 1  . predniSONE (DELTASONE) 20 MG tablet Take 1 tablet (20 mg total) by mouth daily with breakfast. 5 tablet 0   No current facility-administered medications for this visit.     Allergies: Cymbalta [duloxetine hcl]  Past Medical History:  Diagnosis Date  . ALLERGIC RHINITIS   . ANXIETY   . ASTHMA   . Atrial fibrillation ([place])   . BACK PAIN   . Carotid stenosis 04/27/2012   Mild left on community screening  - Nov 2013  . CATARACT NOS   .  DEPRESSION   . GLAUCOMA NOS   . HTN (hypertension)   . Hypercalcemia   . MITRAL VALVE PROLAPSE   . OSTEOPOROSIS   . PERIPHERAL NEUROPATHY   . POSITIONAL VERTIGO   . Rosacea     Past Surgical History:  Procedure Laterality Date  .  ankle surgury     left ankle  . CATARACT EXTRACTION, BILATERAL    . HEMORRHOID SURGERY    . posterior chamber interocular lens implant      Family History  Problem Relation Age of Onset  . CAD Mother        MI at age 5  . Cancer Sister        colon  . Cancer Brother        prostate cancer    Social History   Tobacco Use  . Smoking status: Never Smoker  . Smokeless tobacco: Never Used  Substance Use Topics  . Alcohol use: No    Subjective:   Seen last week with persisting concerns for 2-3 month history of night-time cough; had normal CXR last week- showed hyperinflated lungs; per patient, she has a history of asthma; does not feel like she has had asthma symptoms in almost 35 years; was tried on BREO last week but has not felt "it made enough difference." Does admit to having increased heartburn recently- "feeling more bloated recently." Has been taking OTC "acid" pills but unsure what she is actually taking. Feels that she has been more short of breath for the past 2-3 months as well; is already scheduled to see her cardiologist in follow-up; notes that her belly has "felt more swollen" in the past few months as well; "can't stand for anything to touch my belly." No changes in bowel movements/ no blood in stool; does occasionally have some urinary urgency;     Objective:  Vitals:   12/08/17 1044  BP: 122/76  Pulse: 91  Temp: 97.6 F (36.4 C)  TempSrc: Oral  SpO2: 96%  Weight: 129 lb (58.5 kg)  Height: _0  (1.575 m)    General: Well developed, well nourished, in no acute distress  Skin : Warm and dry.  Head: Normocephalic and atraumatic  Eyes: Sclera and conjunctiva clear; pupils round and reactive to light; extraocular movements  intact  Ears: External normal; canals clear; tympanic membranes normal  Oropharynx: Pink, supple. No suspicious lesions  Neck: Supple without thyromegaly, adenopathy  Lungs: Respirations unlabored; clear to auscultation bilaterally without wheeze, rales, rhonchi  CVS exam: normal rate and regular rhythm.  Abdomen: Soft; nontender; nondistended; normoactive bowel sounds; no masses or hepatosplenomegaly  Neurologic:  Alert and oriented; speech intact; face symmetrical; uses cane; CNII-XII intact without focal deficit   Assessment:  1. Cough   2. Shortness of breath   3. Abdominal pain, unspecified abdominal location     Plan:  1. & 2. Reviewed CXR from last week; ? If patient has underlying GERD aggravating some asthma; will go ahead and treat with combination of Prednisone 20 mg x 5 days, Z-pak; she is also started on Protonix 20 mg and understands to continue this medication; update CBC, CMP today; keep planned follow-up with her cardiologist on 10/28; 3. Will update abdominal ultrasound; may also need to consider pelvic ultrasound if no source for pelvic bloating noted.   No follow-ups on file.  Orders Placed This Encounter  Procedures  . US Abdomen Complete    Standing Status:   Future    Standing Expiration Date:   02/08/2019    Order Specific Question:   Reason for Exam (SYMPTOM  OR DIAGNOSIS REQUIRED)    Answer:   abdominal bloating/ pain x 2-3 months    Order Specific Question:   Preferred imaging location?    Answer:   [facility]  . CBC w/Diff    Standing Status:   Future    Number of Occurrences:   1    Standing Expiration Date:   12/08/2018  . Comp Met (CMET)    Standing Status:   Future    Number of Occurrences:   1    Standing Expiration Date:   12/08/2018    Requested Prescriptions   Signed Prescriptions Disp Refills  . azithromycin (ZITHROMAX) 250 MG tablet 6 tablet 0    Sig: 2 tabs po qd x 1 day; 1 tablet per day x 4 days;  . predniSONE (DELTASONE)  20 MG tablet 5 tablet 0    Sig: Take 1 tablet (20 mg total) by mouth daily with breakfast.  . pantoprazole (PROTONIX) 20 MG tablet 30 tablet 1    Sig: Take 1 tablet (20 mg total) by mouth daily.

## 2017-12-15 ENCOUNTER — Ambulatory Visit
Admission: RE | Admit: 2017-12-15 | Discharge: 2017-12-15 | Disposition: A | Discharge status: Home / Self Care | Payer: PPO | Source: Ambulatory Visit | Attending: Family | Admitting: Family

## 2017-12-15 ENCOUNTER — Ambulatory Visit: Payer: PPO | Admitting: Family

## 2017-12-15 ENCOUNTER — Ambulatory Visit: Payer: Self-pay | Admitting: *Deleted

## 2017-12-15 ENCOUNTER — Other Ambulatory Visit: Payer: Self-pay | Admitting: Family

## 2017-12-15 DIAGNOSIS — R053 Chronic cough: Secondary | ICD-10-CM

## 2017-12-15 DIAGNOSIS — R109 Unspecified abdominal pain: Secondary | ICD-10-CM

## 2017-12-15 DIAGNOSIS — R05 Cough: Secondary | ICD-10-CM | POA: Diagnosis not present

## 2017-12-15 DIAGNOSIS — N281 Cyst of kidney, acquired: Secondary | ICD-10-CM | POA: Diagnosis not present

## 2017-12-15 MED ORDER — HYDROCHLOROTHIAZIDE 12.5 MG PO CAPS: 12.5000 mg | ORAL_CAPSULE | Freq: Every day | ORAL | 0 refills | Status: DC

## 2017-12-15 NOTE — Progress Notes (Signed)
I spoke with patient about CXR changes; she notes the cough is most pronounced when she lies down at night. She does feel that her legs are mildly swollen as well. She has taken HCTZ in the past for "fluid" and tolerated well; will refill that medication; she was unable to be seen today in the office but will keep planned appt tomorrow for 3:20 pm. I also notified her cardiologist about the CHF changes on CXR.

## 2017-12-15 NOTE — Telephone Encounter (Signed)
Pt seen 12/08/17 by L. Murray for similar symptoms; SOB, cough, abdominal pain. Reports worsening of cough and SOB since yesterday. Completed course of antibiotics and prednisone.  States cough is no longer production but "Having fits of coughing." SOB moderate, no wheezing. Reports chest tightness at sternal area "As before." Unsure if febrile. Speech non halting during call. Pt has [place] of abdomen scheduled at 1120 today. Requesting appt "To have my lungs checked out around that time." [place] called and spoke to [place]. States will alert L. [place] Scrape. Care advise given per protocol. Please advise...[phone] Reason for Disposition . [1] Longstanding difficulty breathing (e.g., CHF, COPD, emphysema) AND [2] WORSE than normal  Answer Assessment - Initial Assessment Questions 1. RESPIRATORY STATUS: "Describe your breathing?" (e.g., wheezing, shortness of breath, unable to speak, severe coughing)      Seen 10/15 sob 2. ONSET: "When did this breathing problem begin?"     Worsened yesterday 3. PATTERN "Does the difficult breathing come and go, or has it been constant since it started?"      Constant 4. SEVERITY: "How bad is your breathing?" (e.g., mild, moderate, severe)    - MILD: No SOB at rest, mild SOB with walking, speaks normally in sentences, can lay down, no retractions, pulse < 100.    - MODERATE: SOB at rest, SOB with minimal exertion and prefers to sit, cannot lie down flat, speaks in phrases, mild retractions, audible wheezing, pulse 100-120.    - SEVERE: Very SOB at rest, speaks in single words, struggling to breathe, sitting hunched forward, retractions, pulse > 120      Moderate at times 5. RECURRENT SYMPTOM: "Have you had difficulty breathing before?" If so, ask: "When was the last time?" and "What happened that time?"     Seen 10/15 by L. Murray 6. CARDIAC HISTORY: "Do you have any history of heart disease?" (e.g., heart attack, angina, bypass surgery, angioplasty)       7. LUNG  HISTORY: "Do you have any history of lung disease?"  (e.g., pulmonary embolus, asthma, emphysema)     8. CAUSE: "What do you think is causing the breathing problem?"      9. OTHER SYMPTOMS: "Do you have any other symptoms? (e.g., dizziness, runny nose, cough, chest pain, fever)    Chest "heavy" at sternum. "Not new."  Protocols used: BREATHING DIFFICULTY-A-AH

## 2017-12-15 NOTE — Telephone Encounter (Signed)
Called and left message for patient to obtain xray and lab before coming in for appointment this afternoon.

## 2017-12-15 NOTE — Telephone Encounter (Signed)
I put her in at 1:40 pm today; its the only appointment I had; I will put in CXR order- please get that done prior to OV; would also like to get lab re-checked.

## 2017-12-16 ENCOUNTER — Encounter: Payer: Self-pay | Admitting: Family

## 2017-12-16 ENCOUNTER — Ambulatory Visit (INDEPENDENT_AMBULATORY_CARE_PROVIDER_SITE_OTHER): Payer: PPO | Admitting: Family

## 2017-12-16 VITALS — BP 116/74 | HR 100 | Temp 98.0°F | Ht 62.0 in | Wt 130.1 lb

## 2017-12-16 DIAGNOSIS — I509 Heart failure, unspecified: Secondary | ICD-10-CM

## 2017-12-16 MED ORDER — POTASSIUM CHLORIDE ER 10 MEQ PO TBCR: 10.0000 meq | EXTENDED_RELEASE_TABLET | Freq: Every day | ORAL | 0 refills | Status: DC

## 2017-12-16 MED ORDER — FUROSEMIDE 20 MG PO TABS: 10.0000 mg | ORAL_TABLET | Freq: Every day | ORAL | 0 refills | Status: DC

## 2017-12-16 NOTE — Progress Notes (Signed)
HPI:FU atrial fibrillation. Monitor in August of 2014 showed PAF. Toprol DCed previously due to bradycardia. Echocardiogram in November 2015 showed normal LV function, grade 1 diastolic dysfunction, mild mitral regurgitation with systolic prolapse of anterior mitral valve leaflet. Seen recently for dyspnea; chest xray showed CHF and pleural effusions; abdominal ultrasound also showed pleural effusions.  Patient started on diuretics by primary care.  Since I last saw her,she has improved with addition of diuretics.  She now denies dyspnea, chest pain, palpitations or syncope.  No bleeding.  Current Outpatient Medications  Medication Sig Dispense Refill  . acetaminophen-codeine (TYLENOL #3) 300-30 MG tablet Take 1 tablet by mouth every 8 (eight) hours as needed for moderate pain. 21 tablet 0  . ALPRAZolam (XANAX) 0.5 MG tablet 1/2 - 1 tab by mouth at bedtime as needed 90 tablet 1  . b complex vitamins tablet Take 1 tablet by mouth daily.    . Biotin 1 MG CAPS Take 1 tablet by mouth daily.    . Calcium Carbonate-Vitamin D (CALCIUM + D PO) Take 1 tablet by mouth daily.     . Coenzyme Q10 (COQ-10) 100 MG CAPS Take 100 mg by mouth daily.     . diclofenac sodium (VOLTAREN) 1 % GEL Apply 4 g topically 4 (four) times daily as needed. 200 g 11  . FLUAD 0.5 ML SUSY ADM 0.5ML IM UTD  0  . furosemide (LASIX) 20 MG tablet Take 0.5 tablets (10 mg total) by mouth daily. Prn for swelling 30 tablet 0  . glucosamine-chondroitin 500-400 MG tablet Take 1 tablet by mouth 2 (two) times daily.    . hydrochlorothiazide (MICROZIDE) 12.5 MG capsule Take 1 capsule (12.5 mg total) by mouth daily. As needed for swelling 90 capsule 0  . IRON, FERROUS GLUCONATE, PO Take 1 tablet by mouth 2 (two) times a week.     . lidocaine (LIDODERM) 5 % Place 1 patch onto the skin daily. Remove & Discard patch within 12 hours or as directed by MD 60 patch 1  . losartan (COZAAR) 100 MG tablet TAKE 1 TABLET BY MOUTH  DAILY 90 tablet 3    . LUTEIN PO Take 1 tablet by mouth daily.    . Magnesium Citrate 100 MG TABS Take 100 mg by mouth daily.     . Multiple Vitamins-Minerals (MACULAR VITAMIN BENEFIT PO) Take 2 tablets by mouth daily.    . Multiple Vitamins-Minerals (MULTIVITAMIN & MINERAL PO) Take 1 tablet by mouth daily.    . pantoprazole (PROTONIX) 20 MG tablet Take 1 tablet (20 mg total) by mouth daily. 30 tablet 1  . potassium chloride (K-DUR) 10 MEQ tablet Take 1 tablet (10 mEq total) by mouth daily. 30 tablet 0  . predniSONE (DELTASONE) 20 MG tablet Take 1 tablet (20 mg total) by mouth daily with breakfast. 5 tablet 0  . Rivaroxaban (XARELTO) 15 MG TABS tablet Take 1 tablet (15 mg total) by mouth daily with supper. 90 tablet 1  . Thiamine HCl (B-1 PO) Take 1 tablet by mouth daily.     . timolol (BETIMOL) 0.5 % ophthalmic solution Place 1 drop into both eyes daily.    . timolol (TIMOPTIC) 0.5 % ophthalmic solution PLACE 1 DROP INTO BOTH EYES TWO TIMES DAILY  11  . vitamin B-12 (CYANOCOBALAMIN) 1000 MCG tablet Take 1,000 mcg by mouth every other day.      No current facility-administered medications for this visit.      Past Medical History:  Diagnosis Date  . ALLERGIC RHINITIS   . ANXIETY   . ASTHMA   . Atrial fibrillation ([facility])   . BACK PAIN   . Carotid stenosis 04/27/2012   Mild left on community screening  - Nov 2013  . CATARACT NOS   . DEPRESSION   . GLAUCOMA NOS   . HTN (hypertension)   . Hypercalcemia   . MITRAL VALVE PROLAPSE   . OSTEOPOROSIS   . PERIPHERAL NEUROPATHY   . POSITIONAL VERTIGO   . Rosacea     Past Surgical History:  Procedure Laterality Date  .  ankle surgury     left ankle  . CATARACT EXTRACTION, BILATERAL    . HEMORRHOID SURGERY    . posterior chamber interocular lens implant      Social History   Socioeconomic History  . Marital status: Widowed    Spouse name: Not on file  . Number of children: 0  . Years of education: Not on file  . Highest education level: Not on file   Occupational History  . Occupation: retired Control and instrumentation engineer: RETIRED  Social Needs  . Financial resource strain: Not hard at all  . Food insecurity:    Worry: Never true    Inability: Never true  . Transportation needs:    Medical: No    Non-medical: No  Tobacco Use  . Smoking status: Never Smoker  . Smokeless tobacco: Never Used  Substance and Sexual Activity  . Alcohol use: No  . Drug use: No  . Sexual activity: Not Currently  Lifestyle  . Physical activity:    Days per week: 0 days    Minutes per session: 0 min  . Stress: To some extent  Relationships  . Social connections:    Talks on phone: More than three times a week    Gets together: More than three times a week    Attends religious service: More than 4 times per year    Active member of club or organization: Not on file    Attends meetings of clubs or organizations: More than 4 times per year    Relationship status: Widowed  . Intimate partner violence:    Fear of current or ex partner: Not on file    Emotionally abused: Not on file    Physically abused: Not on file    Forced sexual activity: Not on file  Other Topics Concern  . Not on file  Social History Narrative  . Not on file    Family History  Problem Relation Age of Onset  . CAD Mother        MI at age 51  . Cancer Sister        colon  . Cancer Brother        prostate cancer    ROS: no fevers or chills, productive cough, hemoptysis, dysphasia, odynophagia, melena, hematochezia, dysuria, hematuria, rash, seizure activity, orthopnea, PND, pedal edema, claudication. Remaining systems are negative.  Physical Exam: Well-developed thin in no acute distress.  Skin is warm and dry.  HEENT is normal.  Neck is supple.  Chest is clear to auscultation with normal expansion.  Cardiovascular exam is irregular Abdominal exam nontender or distended. No masses palpated. Extremities show no edema. neuro grossly intact  Electrocardiogram-atrial  fibrillation, left axis deviation, no ST changes.  Personally reviewed. A/P  1 persistant atrial fibrillation-patient is in atrial fibrillation today.  We will continue Xarelto.  Rate is controlled on no medications.  She may have developed some congestive heart failure from atrial fibrillation but this has improved with low-dose diuretic.  She is now asymptomatic.  I would favor rate control and anticoagulation unless her symptoms worsen.  I will see her back in 3 months to make sure she is stable.  Repeat echocardiogram.  2 acute diastolic congestive heart failure-seen recently for cough and dyspnea.  Chest x-ray showed CHF and bilateral pleural effusions.  Patient started on Lasix.  Symptoms have improved.  Check echocardiogram.  We discussed fluid restriction and low-sodium diet.  I will increase Lasix to 20 mg daily and discontinue hydrochlorothiazide.  Check potassium and renal function in 1 week.  3 hypertension-patient's blood pressure is controlled.  Continue present medications.  4 history of mitral regurgitation-repeat echocardiogram.  Olga Millers, MD

## 2017-12-16 NOTE — Patient Instructions (Signed)
Use the Furosemide daily x  3 days to help with the swelling; take the potassium when you take the Furosemide;  Talk to Dr. Jens Som about how to use the HCTZ regularly to prevent the symptoms.

## 2017-12-16 NOTE — Progress Notes (Signed)
Reviewed with patient at OV on 10/23; CHF changes addressed/ treated/ keep planned follow-up with cardiology for next week.

## 2017-12-16 NOTE — Progress Notes (Signed)
Ann Booth is a 84 y.o. female with the following history as recorded in EpicCare:  Patient Active Problem List   Diagnosis Date Noted  . Degenerative arthritis of left knee 06/25/2017  . Peripheral vascular disease (HCC) 05/21/2017  . Rib pain 04/24/2017  . Chest pain 03/16/2017  . Callus of foot 05/26/2016  . Right foot pain 03/21/2016  . Allergic conjunctivitis 03/21/2016  . Insomnia 07/28/2013  . Arthritis of sacroiliac joint 03/30/2013  . Leg length discrepancy 03/30/2013  . N&V (nausea and vomiting) 01/27/2013  . Atrial fibrillation (HCC) 11/01/2012  . Chronic pain of left knee 10/08/2012  . Conjunctivitis 07/21/2012  . Carotid stenosis 04/27/2012  . Chronic lower back pain 04/27/2012  . Idiopathic scoliosis 04/27/2012  . Black stools 10/25/2011  . HTN (hypertension) 08/23/2010  . Encounter for well adult exam with abnormal findings 07/25/2010  . Peripheral edema 07/25/2010  . Hypercalcemia 11/09/2008  . POSITIONAL VERTIGO 11/09/2008  . BACK PAIN 11/09/2008  . Peripheral neuropathic pain 06/01/2007  . Allergic rhinitis 06/01/2007  . SHOULDER PAIN, BILATERAL 06/01/2007  . FATIGUE 06/01/2007  . Hyperlipidemia 10/18/2006  . Anxiety state 10/18/2006  . Depression 10/18/2006  . GLAUCOMA NOS 10/15/2006  . CATARACT NOS 10/15/2006  . Mitral valve disorder 10/15/2006  . Asthma 10/15/2006  . Rosacea 10/15/2006  . Osteoporosis 10/15/2006    Current Outpatient Medications  Medication Sig Dispense Refill  . acetaminophen-codeine (TYLENOL #3) 300-30 MG tablet Take 1 tablet by mouth every 8 (eight) hours as needed for moderate pain. 21 tablet 0  . ALPRAZolam (XANAX) 0.5 MG tablet 1/2 - 1 tab by mouth at bedtime as needed 90 tablet 1  . azithromycin (ZITHROMAX) 250 MG tablet 2 tabs po qd x 1 day; 1 tablet per day x 4 days; 6 tablet 0  . b complex vitamins tablet Take 1 tablet by mouth daily.    . Biotin 1 MG CAPS Take 1 tablet by mouth daily.    . Calcium  Carbonate-Vitamin D (CALCIUM + D PO) Take 1 tablet by mouth daily.     . Coenzyme Q10 (COQ-10) 100 MG CAPS Take 100 mg by mouth daily.     . diclofenac sodium (VOLTAREN) 1 % GEL Apply 4 g topically 4 (four) times daily as needed. 200 g 11  . FLUAD 0.5 ML SUSY ADM 0.5ML IM UTD  0  . glucosamine-chondroitin 500-400 MG tablet Take 1 tablet by mouth 2 (two) times daily.    . hydrochlorothiazide (MICROZIDE) 12.5 MG capsule Take 1 capsule (12.5 mg total) by mouth daily. As needed for swelling 90 capsule 0  . IRON, FERROUS GLUCONATE, PO Take 1 tablet by mouth 2 (two) times a week.     . lidocaine (LIDODERM) 5 % Place 1 patch onto the skin daily. Remove & Discard patch within 12 hours or as directed by MD 60 patch 1  . losartan (COZAAR) 100 MG tablet TAKE 1 TABLET BY MOUTH  DAILY 90 tablet 3  . LUTEIN PO Take 1 tablet by mouth daily.    . Magnesium Citrate 100 MG TABS Take 100 mg by mouth daily.     . Multiple Vitamins-Minerals (MACULAR VITAMIN BENEFIT PO) Take 2 tablets by mouth daily.    . Multiple Vitamins-Minerals (MULTIVITAMIN & MINERAL PO) Take 1 tablet by mouth daily.    . pantoprazole (PROTONIX) 20 MG tablet Take 1 tablet (20 mg total) by mouth daily. 30 tablet 1  . predniSONE (DELTASONE) 20 MG tablet Take 1 tablet (20  mg total) by mouth daily with breakfast. 5 tablet 0  . Rivaroxaban (XARELTO) 15 MG TABS tablet Take 1 tablet (15 mg total) by mouth daily with supper. 90 tablet 1  . Thiamine HCl (B-1 PO) Take 1 tablet by mouth daily.     . timolol (BETIMOL) 0.5 % ophthalmic solution Place 1 drop into both eyes daily.    . timolol (TIMOPTIC) 0.5 % ophthalmic solution PLACE 1 DROP INTO BOTH EYES TWO TIMES DAILY  11  . vitamin B-12 (CYANOCOBALAMIN) 1000 MCG tablet Take 1,000 mcg by mouth every other day.     . furosemide (LASIX) 20 MG tablet Take 0.5 tablets (10 mg total) by mouth daily. Prn for swelling 30 tablet 0  . potassium chloride (K-DUR) 10 MEQ tablet Take 1 tablet (10 mEq total) by mouth  daily. 30 tablet 0   No current facility-administered medications for this visit.     Allergies: Cymbalta [duloxetine hcl]  Past Medical History:  Diagnosis Date  . ALLERGIC RHINITIS   . ANXIETY   . ASTHMA   . Atrial fibrillation (HCC)   . BACK PAIN   . Carotid stenosis 04/27/2012   Mild left on community screening  - Nov 2013  . CATARACT NOS   . DEPRESSION   . GLAUCOMA NOS   . HTN (hypertension)   . Hypercalcemia   . MITRAL VALVE PROLAPSE   . OSTEOPOROSIS   . PERIPHERAL NEUROPATHY   . POSITIONAL VERTIGO   . Rosacea     Past Surgical History:  Procedure Laterality Date  .  ankle surgury     left ankle  . CATARACT EXTRACTION, BILATERAL    . HEMORRHOID SURGERY    . posterior chamber interocular lens implant      Family History  Problem Relation Age of Onset  . CAD Mother        MI at age 94  . Cancer Sister        colon  . Cancer Brother        prostate cancer    Social History   Tobacco Use  . Smoking status: Never Smoker  . Smokeless tobacco: Never Used  Substance Use Topics  . Alcohol use: No    Subjective:  Presents to follow up on CHF changes noted on CXR yesterday; is scheduled to see her cardiologist next week in follow-up; had called in yesterday with complaints that her cough had gotten much worse; notes today she is actually feeling better; HCTZ was called in yesterday- she has not started yet;    Objective:  Vitals:   12/16/17 1523  BP: 116/74  Pulse: 100  Temp: 98 F (36.7 C)  TempSrc: Oral  SpO2: 96%  Weight: 130 lb 1.3 oz (59 kg)  Height: 5\' 2"  (1.575 m)    General: Well developed, well nourished, in no acute distress  Skin : Warm and dry.  Head: Normocephalic and atraumatic  Eyes: Sclera and conjunctiva clear; pupils round and reactive to light; extraocular movements intact  Ears: External normal; canals clear; tympanic membranes normal  Oropharynx: Pink, supple. No suspicious lesions  Neck: Supple without thyromegaly, adenopathy   Lungs: Respirations unlabored; clear to auscultation bilaterally without wheeze, rales, rhonchi  CVS exam: normal rate and regular rhythm.  Extremities: No edema, cyanosis, clubbing  Vessels: Symmetric bilaterally  Neurologic: Alert and oriented; speech intact; face symmetrical; moves all extremities well; CNII-XII intact without focal deficit   Assessment:  1. Acute congestive heart failure, unspecified heart failure type (  HCC)     Plan:  Reviewed CXR and abdominal ultrasound results with patient; will start Lasix 10 mg daily to use for the next 3-4 days for initial treatment; she understands to take potassium daily while on Lasix; she will then change to HCTZ on Sunday and use for daily maintenance; keep planned appt with cardiology for next week- she understands they may make medications adjustments as well.   No follow-ups on file.  No orders of the defined types were placed in this encounter.   Requested Prescriptions   Signed Prescriptions Disp Refills  . furosemide (LASIX) 20 MG tablet 30 tablet 0    Sig: Take 0.5 tablets (10 mg total) by mouth daily. Prn for swelling  . potassium chloride (K-DUR) 10 MEQ tablet 30 tablet 0    Sig: Take 1 tablet (10 mEq total) by mouth daily.

## 2017-12-22 ENCOUNTER — Ambulatory Visit: Payer: PPO | Admitting: Cardiology

## 2017-12-22 ENCOUNTER — Encounter: Payer: Self-pay | Admitting: Cardiology

## 2017-12-22 VITALS — BP 100/60 | HR 84 | Resp 16 | Ht 62.0 in | Wt 123.6 lb

## 2017-12-22 DIAGNOSIS — I059 Rheumatic mitral valve disease, unspecified: Secondary | ICD-10-CM

## 2017-12-22 DIAGNOSIS — I480 Paroxysmal atrial fibrillation: Secondary | ICD-10-CM | POA: Diagnosis not present

## 2017-12-22 DIAGNOSIS — I5032 Chronic diastolic (congestive) heart failure: Secondary | ICD-10-CM

## 2017-12-22 DIAGNOSIS — I10 Essential (primary) hypertension: Secondary | ICD-10-CM

## 2017-12-22 MED ORDER — FUROSEMIDE 20 MG PO TABS: 20.0000 mg | ORAL_TABLET | Freq: Every day | ORAL | 3 refills | Status: DC

## 2017-12-22 NOTE — Patient Instructions (Signed)
Medication Instructions:  STOP HCTZ  INCREASE FUROSEMIDE TO 20 MG ONCE DAILY= 1 TABLET ONCE DAILY If you need a refill on your cardiac medications before your next appointment, please call your pharmacy.   Lab work: Your physician recommends that you return for lab work in: ONE WEEK If you have labs (blood work) drawn today and your tests are completely normal, you will receive your results only by: Marland Kitchen MyChart Message (if you have MyChart) OR . A paper copy in the mail If you have any lab test that is abnormal or we need to change your treatment, we will call you to review the results.  Testing/Procedures: Your physician has requested that you have an echocardiogram. Echocardiography is a painless test that uses sound waves to create images of your heart. It provides your doctor with information about the size and shape of your heart and how well your heart's chambers and valves are working. This procedure takes approximately one hour. There are no restrictions for this procedure.    Follow-Up: Your physician recommends that you schedule a follow-up appointment in: 3 MONTHS WITH DR Jens Som

## 2017-12-27 ENCOUNTER — Other Ambulatory Visit: Payer: Self-pay | Admitting: Cardiology

## 2017-12-28 ENCOUNTER — Telehealth: Payer: Self-pay | Admitting: Cardiology

## 2017-12-28 ENCOUNTER — Ambulatory Visit: Payer: PPO | Admitting: Internal Medicine

## 2017-12-28 MED ORDER — RIVAROXABAN 15 MG PO TABS: ORAL_TABLET | ORAL | 1 refills | Status: DC

## 2017-12-28 NOTE — Telephone Encounter (Signed)
New Message           *STAT* If patient is at the pharmacy, call can be transferred to refill team.   1. Which medications need to be refilled? (please list name of each medication and dose if known)   XARELTO 15 MG TABS tablet     2. Which pharmacy/location (including street and city if local pharmacy) is medication to be sent to? [facility] [address] [place], [place] - [address] [phone] (Phone) [phone] (Fax)     3. Do they need a 30 day or 90 day supply? 90

## 2017-12-31 ENCOUNTER — Other Ambulatory Visit: Payer: Self-pay

## 2017-12-31 ENCOUNTER — Other Ambulatory Visit: Payer: PPO | Admitting: *Deleted

## 2017-12-31 ENCOUNTER — Ambulatory Visit (HOSPITAL_COMMUNITY): Payer: PPO | Attending: Cardiovascular Disease

## 2017-12-31 DIAGNOSIS — I059 Rheumatic mitral valve disease, unspecified: Secondary | ICD-10-CM | POA: Insufficient documentation

## 2017-12-31 DIAGNOSIS — I480 Paroxysmal atrial fibrillation: Secondary | ICD-10-CM | POA: Diagnosis not present

## 2018-01-01 LAB — BASIC METABOLIC PANEL
BUN/Creatinine Ratio: 20 (ref 12–28)
BUN: 16 mg/dL (ref 8–27)
CO2: 22 mmol/L (ref 20–29)
CREATININE: 0.81 mg/dL (ref 0.57–1.00)
Calcium: 9.3 mg/dL (ref 8.7–10.3)
Chloride: 104 mmol/L (ref 96–106)
GFR calc Af Amer: 74 mL/min/{1.73_m2} (ref >59)
GFR calc non Af Amer: 65 mL/min/{1.73_m2} (ref >59)
GLUCOSE: 93 mg/dL (ref 65–99)
Potassium: 4.3 mmol/L (ref 3.5–5.2)
SODIUM: 140 mmol/L (ref 134–144)

## 2018-01-11 ENCOUNTER — Ambulatory Visit: Payer: Self-pay

## 2018-01-11 NOTE — Telephone Encounter (Signed)
Pt. Reports has a feeling "of pressure in my head when I'm walking." BP at home this morning was 140/91. No other symptoms. Request an appointment Wed. Appointment made as requested. Instructed if symptoms worsened to call back or go to ED for evaluation. Reason for Disposition . [1] Taking BP medications AND [2] feels is having side effects (e.g., impotence, cough, dizzy upon standing)  Answer Assessment - Initial Assessment Questions 1. BLOOD PRESSURE: "What is the blood pressure?" "Did you take at least two measurements 5 minutes apart?"     140/91 this morning 2. ONSET: "When did you take your blood pressure?"     This morning 3. HOW: "How did you obtain the blood pressure?" (e.g., visiting nurse, automatic home BP monitor)     Home BP monitor 4. HISTORY: "Do you have a history of high blood pressure?"     Yes  5. MEDICATIONS: "Are you taking any medications for blood pressure?" "Have you missed any doses recently?"     mISSED ONE FLUID PILL YESTERDAY 6. OTHER SYMPTOMS: "Do you have any symptoms?" (e.g., headache, chest pain, blurred vision, difficulty breathing, weakness)     Head pressure 7. PREGNANCY: "Is there any chance you are pregnant?" "When was your last menstrual period?"     No  Protocols used: HIGH BLOOD PRESSURE-A-AH

## 2018-01-12 ENCOUNTER — Other Ambulatory Visit: Payer: Self-pay

## 2018-01-12 NOTE — Patient Outreach (Signed)
[facility]) Care Management  01/12/2018  Ann Booth 1926/06/04 [id number]   Referral Date: 01/11/18 Referral Source: [facility] Referral Reason:  Head pressure   Outreach Attempt: spoke with patient. She states she is doing some better.  She states she gets head pressure from time to time but nothing permanent.  Patient to see PCP on tomorrow for follow up.  Patient states that she thinks her blood pressure monitor is off some and capturing her blood pressure higher than what it is.  She plans to take it to her appointment tomorrow to be checked.  Patient declines any further needs and appreciative of call.     Plan: RN CM will close case.     Bary Leriche, RN, MSN [facility] Care Management Care Management Coordinator Direct Line [phone] Toll Free: [phone]  Fax: [phone]

## 2018-01-13 ENCOUNTER — Ambulatory Visit (INDEPENDENT_AMBULATORY_CARE_PROVIDER_SITE_OTHER): Payer: PPO | Admitting: Family

## 2018-01-13 ENCOUNTER — Encounter: Payer: Self-pay | Admitting: Family

## 2018-01-13 VITALS — BP 122/78 | HR 71 | Temp 97.8°F | Ht 62.0 in | Wt 126.1 lb

## 2018-01-13 DIAGNOSIS — H6990 Unspecified Eustachian tube disorder, unspecified ear: Secondary | ICD-10-CM

## 2018-01-13 DIAGNOSIS — H6980 Other specified disorders of Eustachian tube, unspecified ear: Secondary | ICD-10-CM

## 2018-01-13 DIAGNOSIS — I10 Essential (primary) hypertension: Secondary | ICD-10-CM

## 2018-01-13 MED ORDER — POTASSIUM CHLORIDE ER 10 MEQ PO TBCR: 10.0000 meq | EXTENDED_RELEASE_TABLET | Freq: Every day | ORAL | 6 refills | Status: DC

## 2018-01-13 MED ORDER — LORATADINE 10 MG PO TABS: 10.0000 mg | ORAL_TABLET | Freq: Every day | ORAL | 11 refills | Status: DC

## 2018-01-13 NOTE — Patient Instructions (Signed)
Please try taking Claritin 10 mg daily;    Eustachian Tube Dysfunction The eustachian tube connects the middle ear to the back of the nose. It regulates air pressure in the middle ear by allowing air to move between the ear and nose. It also helps to drain fluid from the middle ear space. When the eustachian tube does not function properly, air pressure, fluid, or both can build up in the middle ear. Eustachian tube dysfunction can affect one or both ears. What are the causes? This condition happens when the eustachian tube becomes blocked or cannot open normally. This may result from:  Ear infections.  Colds and other upper respiratory infections.  Allergies.  Irritation, such as from cigarette smoke or acid from the stomach coming up into the esophagus (gastroesophageal reflux).  Sudden changes in air pressure, such as from descending in an airplane.  Abnormal growths in the nose or throat, such as nasal polyps, tumors, or enlarged tissue at the back of the throat (adenoids).  What increases the risk? This condition may be more likely to develop in people who smoke and people who are overweight. Eustachian tube dysfunction may also be more likely to develop in children, especially children who have:  Certain birth defects of the mouth, such as cleft palate.  Large tonsils and adenoids.  What are the signs or symptoms? Symptoms of this condition may include:  A feeling of fullness in the ear.  Ear pain.  Clicking or popping noises in the ear.  Ringing in the ear.  Hearing loss.  Loss of balance.  Symptoms may get worse when the air pressure around you changes, such as when you travel to an area of high elevation or fly on an airplane. How is this diagnosed? This condition may be diagnosed based on:  Your symptoms.  A physical exam of your ear, nose, and throat.  Tests, such as those that measure: ? The movement of your eardrum (tympanogram). ? Your hearing  (audiometry).  How is this treated? Treatment depends on the cause and severity of your condition. If your symptoms are mild, you may be able to relieve your symptoms by moving air into ("popping") your ears. If you have symptoms of fluid in your ears, treatment may include:  Decongestants.  Antihistamines.  Nasal sprays or ear drops that contain medicines that reduce swelling (steroids).  In some cases, you may need to have a procedure to drain the fluid in your eardrum (myringotomy). In this procedure, a small tube is placed in the eardrum to:  Drain the fluid.  Restore the air in the middle ear space.  Follow these instructions at home:  Take over-the-counter and prescription medicines only as told by your health care provider.  Use techniques to help pop your ears as recommended by your health care provider. These may include: ? Chewing gum. ? Yawning. ? Frequent, forceful swallowing. ? Closing your mouth, holding your nose closed, and gently blowing as if you are trying to blow air out of your nose.  Do not do any of the following until your health care provider approves: ? Travel to high altitudes. ? Fly in airplanes. ? Work in a Estate agent or room. ? Scuba dive.  Keep your ears dry. Dry your ears completely after showering or bathing.  Do not smoke.  Keep all follow-up visits as told by your health care provider. This is important. Contact a health care provider if:  Your symptoms do not go away after treatment.  Your symptoms come back after treatment.  You are unable to pop your ears.  You have: ? A fever. ? Pain in your ear. ? Pain in your head or neck. ? Fluid draining from your ear.  Your hearing suddenly changes.  You become very dizzy.  You lose your balance. This information is not intended to replace advice given to you by your health care provider. Make sure you discuss any questions you have with your health care provider. Document  Released: 03/09/2015 Document Revised: 07/19/2015 Document Reviewed: 03/01/2014 Elsevier Interactive Patient Education  [employer].

## 2018-01-13 NOTE — Progress Notes (Signed)
Ann Booth is a 84 y.o. female with the following history as recorded in EpicCare:  Patient Active Problem List   Diagnosis Date Noted  . Degenerative arthritis of left knee 06/25/2017  . Peripheral vascular disease (HCC) 05/21/2017  . Rib pain 04/24/2017  . Chest pain 03/16/2017  . Callus of foot 05/26/2016  . Right foot pain 03/21/2016  . Allergic conjunctivitis 03/21/2016  . Insomnia 07/28/2013  . Arthritis of sacroiliac joint 03/30/2013  . Leg length discrepancy 03/30/2013  . N&V (nausea and vomiting) 01/27/2013  . Atrial fibrillation (HCC) 11/01/2012  . Chronic pain of left knee 10/08/2012  . Conjunctivitis 07/21/2012  . Carotid stenosis 04/27/2012  . Chronic lower back pain 04/27/2012  . Idiopathic scoliosis 04/27/2012  . Black stools 10/25/2011  . HTN (hypertension) 08/23/2010  . Encounter for well adult exam with abnormal findings 07/25/2010  . Peripheral edema 07/25/2010  . Hypercalcemia 11/09/2008  . POSITIONAL VERTIGO 11/09/2008  . BACK PAIN 11/09/2008  . Peripheral neuropathic pain 06/01/2007  . Allergic rhinitis 06/01/2007  . SHOULDER PAIN, BILATERAL 06/01/2007  . FATIGUE 06/01/2007  . Hyperlipidemia 10/18/2006  . Anxiety state 10/18/2006  . Depression 10/18/2006  . GLAUCOMA NOS 10/15/2006  . CATARACT NOS 10/15/2006  . Mitral valve disorder 10/15/2006  . Asthma 10/15/2006  . Rosacea 10/15/2006  . Osteoporosis 10/15/2006    Current Outpatient Medications  Medication Sig Dispense Refill  . acetaminophen-codeine (TYLENOL #3) 300-30 MG tablet Take 1 tablet by mouth every 8 (eight) hours as needed for moderate pain. 21 tablet 0  . ALPRAZolam (XANAX) 0.5 MG tablet 1/2 - 1 tab by mouth at bedtime as needed 90 tablet 1  . b complex vitamins tablet Take 1 tablet by mouth daily.    . Biotin 1 MG CAPS Take 1 tablet by mouth daily.    . Calcium Carbonate-Vitamin D (CALCIUM + D PO) Take 1 tablet by mouth daily.     . Coenzyme Q10 (COQ-10) 100 MG CAPS Take 100  mg by mouth daily.     . diclofenac sodium (VOLTAREN) 1 % GEL Apply 4 g topically 4 (four) times daily as needed. 200 g 11  . furosemide (LASIX) 20 MG tablet Take 1 tablet (20 mg total) by mouth daily. 90 tablet 3  . glucosamine-chondroitin 500-400 MG tablet Take 1 tablet by mouth 2 (two) times daily.    . IRON, FERROUS GLUCONATE, PO Take 1 tablet by mouth 2 (two) times a week.     . lidocaine (LIDODERM) 5 % Place 1 patch onto the skin daily. Remove & Discard patch within 12 hours or as directed by MD 60 patch 1  . losartan (COZAAR) 100 MG tablet TAKE 1 TABLET BY MOUTH  DAILY 90 tablet 3  . LUTEIN PO Take 1 tablet by mouth daily.    . Magnesium Citrate 100 MG TABS Take 100 mg by mouth daily.     . Multiple Vitamins-Minerals (MACULAR VITAMIN BENEFIT PO) Take 2 tablets by mouth daily.    . Multiple Vitamins-Minerals (MULTIVITAMIN & MINERAL PO) Take 1 tablet by mouth daily.    . pantoprazole (PROTONIX) 20 MG tablet Take 1 tablet (20 mg total) by mouth daily. 30 tablet 1  . Rivaroxaban (XARELTO) 15 MG TABS tablet TAKE 1 TABLET BY MOUTH ONCE DAILY WITH SUPPER 90 tablet 1  . Thiamine HCl (B-1 PO) Take 1 tablet by mouth daily.     . timolol (BETIMOL) 0.5 % ophthalmic solution Place 1 drop into both eyes daily.    Marland Kitchen  timolol (TIMOPTIC) 0.5 % ophthalmic solution PLACE 1 DROP INTO BOTH EYES TWO TIMES DAILY  11  . vitamin B-12 (CYANOCOBALAMIN) 1000 MCG tablet Take 1,000 mcg by mouth every other day.     . potassium chloride (K-DUR) 10 MEQ tablet Take 1 tablet (10 mEq total) by mouth daily. 30 tablet 6   No current facility-administered medications for this visit.     Allergies: Cymbalta [duloxetine hcl]  Past Medical History:  Diagnosis Date  . ALLERGIC RHINITIS   . ANXIETY   . ASTHMA   . Atrial fibrillation (HCC)   . BACK PAIN   . Carotid stenosis 04/27/2012   Mild left on community screening  - Nov 2013  . CATARACT NOS   . DEPRESSION   . GLAUCOMA NOS   . HTN (hypertension)   . Hypercalcemia    . MITRAL VALVE PROLAPSE   . OSTEOPOROSIS   . PERIPHERAL NEUROPATHY   . POSITIONAL VERTIGO   . Rosacea     Past Surgical History:  Procedure Laterality Date  .  ankle surgury     left ankle  . CATARACT EXTRACTION, BILATERAL    . HEMORRHOID SURGERY    . posterior chamber interocular lens implant      Family History  Problem Relation Age of Onset  . CAD Mother        MI at age 81  . Cancer Sister        colon  . Cancer Brother        prostate cancer    Social History   Tobacco Use  . Smoking status: Never Smoker  . Smokeless tobacco: Never Used  Substance Use Topics  . Alcohol use: No    Subjective:  Patient presents with concerns that her blood pressure is not well controlled; Denies any chest pain, shortness of breath, blurred vision or headache. Had a few readings recently of 140/90 but admits she has been eating more salt recently; Brings her home cuff for comparison; Does note that his head has felt "very full" recently and had occasional sensation of dizziness when changing positions; her sister accompanies her today;     Objective:  Vitals:   01/13/18 1407  BP: 122/78  Pulse: 71  Temp: 97.8 F (36.6 C)  TempSrc: Oral  SpO2: 92%  Weight: 126 lb 1.3 oz (57.2 kg)  Height: 5\' 2"  (1.575 m)    General: Well developed, well nourished, in no acute distress  Skin : Warm and dry.  Head: Normocephalic and atraumatic  Eyes: Sclera and conjunctiva clear; pupils round and reactive to light; extraocular movements intact  Ears: External normal; canals clear; tympanic membranes congested  Oropharynx: Pink, supple. No suspicious lesions  Neck: Supple without thyromegaly, adenopathy  Lungs: Respirations unlabored; clear to auscultation bilaterally without wheeze, rales, rhonchi  CVS exam: irregularly irregular rhythm.   Extremities: No edema, cyanosis, clubbing  Vessels: Symmetric bilaterally  Neurologic: Alert and oriented; speech intact; face symmetrical; moves all  extremities well; CNII-XII intact without focal deficit   Assessment:  1. Essential hypertension   2. Dysfunction of Eustachian tube, unspecified laterality     Plan:  1. Stable; well controlled; reassurance; continue same medications; encouraged her to take her potassium daily since she is now on daily Lasix; follow-up in 6 months, sooner prn. 2. Cannot use nasal sprays due to glaucoma; trial of Claritin 10 mg daily;   No follow-ups on file.  No orders of the defined types were placed in this encounter.  Requested Prescriptions   Signed Prescriptions Disp Refills  . potassium chloride (K-DUR) 10 MEQ tablet 30 tablet 6    Sig: Take 1 tablet (10 mEq total) by mouth daily.

## 2018-03-04 DIAGNOSIS — G44201 Tension-type headache, unspecified, intractable: Secondary | ICD-10-CM | POA: Diagnosis not present

## 2018-03-04 DIAGNOSIS — J310 Chronic rhinitis: Secondary | ICD-10-CM | POA: Diagnosis not present

## 2018-03-08 DIAGNOSIS — H401112 Primary open-angle glaucoma, right eye, moderate stage: Secondary | ICD-10-CM | POA: Diagnosis not present

## 2018-03-08 DIAGNOSIS — H401123 Primary open-angle glaucoma, left eye, severe stage: Secondary | ICD-10-CM | POA: Diagnosis not present

## 2018-03-09 ENCOUNTER — Ambulatory Visit (INDEPENDENT_AMBULATORY_CARE_PROVIDER_SITE_OTHER): Payer: PPO | Admitting: Internal Medicine

## 2018-03-09 ENCOUNTER — Encounter: Payer: Self-pay | Admitting: Internal Medicine

## 2018-03-09 ENCOUNTER — Other Ambulatory Visit (INDEPENDENT_AMBULATORY_CARE_PROVIDER_SITE_OTHER): Payer: PPO

## 2018-03-09 VITALS — BP 104/70 | HR 86 | Temp 97.5°F | Ht 62.0 in | Wt 127.0 lb

## 2018-03-09 DIAGNOSIS — R5383 Other fatigue: Secondary | ICD-10-CM

## 2018-03-09 LAB — COMPREHENSIVE METABOLIC PANEL
ALBUMIN: 3.9 g/dL (ref 3.5–5.2)
ALK PHOS: 64 U/L (ref 39–117)
ALT: 24 U/L (ref 0–35)
AST: 36 U/L (ref 0–37)
BUN: 16 mg/dL (ref 6–23)
CO2: 27 mEq/L (ref 19–32)
Calcium: 9.7 mg/dL (ref 8.4–10.5)
Chloride: 105 mEq/L (ref 96–112)
Creatinine, Ser: 0.77 mg/dL (ref 0.40–1.20)
GFR: 74.85 mL/min (ref >60.00)
GLUCOSE: 95 mg/dL (ref 70–99)
Potassium: 4.2 mEq/L (ref 3.5–5.1)
Sodium: 140 mEq/L (ref 135–145)
TOTAL PROTEIN: 6.8 g/dL (ref 6.0–8.3)
Total Bilirubin: 0.6 mg/dL (ref 0.2–1.2)

## 2018-03-09 LAB — CBC
HCT: 37.7 % (ref 36.0–46.0)
Hemoglobin: 12.7 g/dL (ref 12.0–15.0)
MCHC: 33.7 g/dL (ref 30.0–36.0)
MCV: 94.8 fl (ref 78.0–100.0)
Platelets: 157 10*3/uL (ref 150.0–400.0)
RBC: 3.98 Mil/uL (ref 3.87–5.11)
RDW: 14.8 % (ref 11.5–15.5)
WBC: 4 10*3/uL (ref 4.0–10.5)

## 2018-03-09 LAB — VITAMIN B12

## 2018-03-09 LAB — VITAMIN D 25 HYDROXY (VIT D DEFICIENCY, FRACTURES): VITD: 89.26 ng/mL (ref 30.00–100.00)

## 2018-03-09 LAB — TSH: TSH: 1.03 u[IU]/mL (ref 0.35–4.50)

## 2018-03-09 NOTE — Patient Instructions (Addendum)
We will check the blood work today and have you stop taking lasix (furosemide).

## 2018-03-09 NOTE — Progress Notes (Signed)
   Subjective:   Patient ID: Ann Booth, female    DOB: 1926-04-10, 85 y.o.   MRN: [id number]  HPI The patient is a 85 YO female coming in for fatigue. Denies changes in diet or sleep. Denies changes in medications or otc medications. Denies change in weight or chills or fevers. Denies night sweats. Denies changes in bowels, chest pains or SOB.   Review of Systems  Constitutional: Positive for fatigue.  HENT: Negative.   Eyes: Negative.   Respiratory: Negative for cough, chest tightness and shortness of breath.   Cardiovascular: Negative for chest pain, palpitations and leg swelling.  Gastrointestinal: Negative for abdominal distention, abdominal pain, constipation, diarrhea, nausea and vomiting.  Musculoskeletal: Negative.   Skin: Negative.   Neurological: Negative.   Psychiatric/Behavioral: Negative.     Objective:  Physical Exam Constitutional:      Appearance: She is well-developed.  HENT:     Head: Normocephalic and atraumatic.  Neck:     Musculoskeletal: Normal range of motion.  Cardiovascular:     Rate and Rhythm: Normal rate and regular rhythm.  Pulmonary:     Effort: Pulmonary effort is normal. No respiratory distress.     Breath sounds: Normal breath sounds. No wheezing or rales.  Abdominal:     General: Bowel sounds are normal. There is no distension.     Palpations: Abdomen is soft.     Tenderness: There is no abdominal tenderness. There is no rebound.  Skin:    General: Skin is warm and dry.  Neurological:     Mental Status: She is alert and oriented to person, place, and time.     Coordination: Coordination normal.     Vitals:   03/09/18 1344  BP: 104/70  Pulse: 86  Temp: (!) 97.5 F (36.4 C)  TempSrc: Oral  SpO2: 95%  Weight: 127 lb (57.6 kg)  Height: 5\' 2"  (1.575 m)    Assessment & Plan:

## 2018-03-10 DIAGNOSIS — R5383 Other fatigue: Secondary | ICD-10-CM | POA: Insufficient documentation

## 2018-03-10 DIAGNOSIS — R531 Weakness: Secondary | ICD-10-CM | POA: Insufficient documentation

## 2018-03-10 NOTE — Assessment & Plan Note (Signed)
Checking labs for thyroid, vitamins, CBC, CMP. Adjust as needed. We talked about how it may be normal to need more resting at 90. Will stop lasix as she has no fluid, no CHF on recent echo and it could be causing fatigue.

## 2018-03-11 ENCOUNTER — Ambulatory Visit: Payer: Self-pay | Admitting: *Deleted

## 2018-03-11 NOTE — Telephone Encounter (Addendum)
Patient is having trouble with her breathing. Patient states she had trouble with her breathing last night - SOB. Patient is having trouble getting a deep breath and she has started hearing a rattle in her chest.Patient states she was afraid to go to sleep- she states she is not having congestion or severe cough. Patient was just seen in office 2 days ago and she states she was told she had asthma- but the note does not reflect that- it refers to fatigue. Patient is feeling fatigued- she states she can not walk to her ramp outside without having chest pain. Patient advised if she is having moderate problems breathing and she can not get enough air in- she needs to go to ED for evaluation. She states she does not want to call EMS- she will find a ride- will call patient in 15 minutes to make sure she found ride.  Answer Assessment - Initial Assessment Questions 1. RESPIRATORY STATUS: "Describe your breathing?" (e.g., wheezing, shortness of breath, unable to speak, severe coughing)      Patient feels that she is not getting enough air in with her breathing 2. ONSET: "When did this breathing problem begin?"      Breathing problem started- several weeks- getting worse- last night was bad 3. PATTERN "Does the difficult breathing come and go, or has it been constant since it started?"      Comes and goes 4. SEVERITY: "How bad is your breathing?" (e.g., mild, moderate, severe)    - MILD: No SOB at rest, mild SOB with walking, speaks normally in sentences, can lay down, no retractions, pulse < 100.    - MODERATE: SOB at rest, SOB with minimal exertion and prefers to sit, cannot lie down flat, speaks in phrases, mild retractions, audible wheezing, pulse 100-120.    - SEVERE: Very SOB at rest, speaks in single words, struggling to breathe, sitting hunched forward, retractions, pulse > 120      moderate 5. RECURRENT SYMPTOM: "Have you had difficulty breathing before?" If so, ask: "When was the last time?" and  "What happened that time?"      No- patient was seen at office 2 days ago- diagnosed with asthma 6. CARDIAC HISTORY: "Do you have any history of heart disease?" (e.g., heart attack, angina, bypass surgery, angioplasty)      yes 7. LUNG HISTORY: "Do you have any history of lung disease?"  (e.g., pulmonary embolus, asthma, emphysema)     asthma 8. CAUSE: "What do you think is causing the breathing problem?"      Possible asthma attack/lung infection 9. OTHER SYMPTOMS: "Do you have any other symptoms? (e.g., dizziness, runny nose, cough, chest pain, fever)     allergy 10. PREGNANCY: "Is there any chance you are pregnant?" "When was your last menstrual period?"       n/a 11. TRAVEL: "Have you traveled out of the country in the last month?" (e.g., travel history, exposures)       no  Protocols used: BREATHING DIFFICULTY-A-AH

## 2018-03-11 NOTE — Telephone Encounter (Signed)
  Reason for Disposition . [1] MODERATE difficulty breathing (e.g., speaks in phrases, SOB even at rest, pulse 100-120) AND [2] NEW-onset or WORSE than normal  Protocols used: BREATHING DIFFICULTY-A-AH  

## 2018-03-11 NOTE — Telephone Encounter (Signed)
Called patient back- her neighbor is going to take her- she is going to wait until children get out of school- patient advised if she gets worse to call 911.

## 2018-03-15 ENCOUNTER — Ambulatory Visit (INDEPENDENT_AMBULATORY_CARE_PROVIDER_SITE_OTHER): Payer: PPO | Admitting: *Deleted

## 2018-03-15 VITALS — BP 112/78 | HR 83 | Resp 17 | Ht 62.0 in | Wt 129.0 lb

## 2018-03-15 DIAGNOSIS — Z0000 Encounter for general adult medical examination without abnormal findings: Secondary | ICD-10-CM

## 2018-03-15 NOTE — Patient Instructions (Addendum)
Continue doing brain stimulating activities (puzzles, reading, adult coloring books, staying active) to keep memory sharp.   Continue to eat heart healthy diet (full of fruits, vegetables, whole grains, lean protein, water--limit salt, fat, and sugar intake) and increase physical activity as tolerated.   Ann Booth , Thank you for taking time to come for your Medicare Wellness Visit. I appreciate your ongoing commitment to your health goals. Please review the following plan we discussed and let me know if I can assist you in the future.   These are the goals we discussed: Goals    . Patient Stated     Increase physical activity by walking with my neighbors when they walk, check into [facility] and [employer]. Go bed a little earlier, do eye drops at 10:00 and 10:00 so I can go to bed earlier.     . Patient Stated     I will start to do chair exercises 2-3 times weekly. Start walking with my neighbors in the afternoon.        This is a list of the screening recommended for you and due dates:  Health Maintenance  Topic Date Due  . Flu Shot  05/26/2018*  . Tetanus Vaccine  01/30/2020  . DEXA scan (bone density measurement)  Completed  . Pneumonia vaccines  Completed  *Topic was postponed. The date shown is not the original due date.     It is important to avoid accidents which may result in broken bones.  Here are a few ideas on how to make your home safer so you will be less likely to trip or fall.  1. Use nonskid mats or non slip strips in your shower or tub, on your bathroom floor and around sinks.  If you know that you have spilled water, wipe it up! 2. In the bathroom, it is important to have properly installed grab bars on the walls or on the edge of the tub.  Towel racks are NOT strong enough for you to hold onto or to pull on for support. 3. Stairs and hallways should have enough light.  Add lamps or night lights if you need ore light. 4. It is good to have handrails on  both sides of the stairs if possible.  Always fix broken handrails right away. 5. It is important to see the edges of steps.  Paint the edges of outdoor steps white so you can see them better.  Put colored tape on the edge of inside steps. 6. Throw-rugs are dangerous because they can slide.  Removing the rugs is the best idea, but if they must stay, add adhesive carpet tape to prevent slipping. 7. Do not keep things on stairs or in the halls.  Remove small furniture that blocks the halls as it may cause you to trip.  Keep telephone and electrical cords out of the way where you walk. 8. Always were sturdy, rubber-soled shoes for good support.  Never wear just socks, especially on the stairs.  Socks may cause you to slip or fall.  Do not wear full-length housecoats as you can easily trip on the bottom.  9. Place the things you use the most on the shelves that are the easiest to reach.  If you use a stepstool, make sure it is in good condition.  If you feel unsteady, DO NOT climb, ask for help. 10. If a health professional advises you to use a cane or walker, do not be ashamed.  These items  can keep you from falling and breaking your bones.  Health Maintenance, Female Adopting a healthy lifestyle and getting preventive care can go a long way to promote health and wellness. Talk with your health care provider about what schedule of regular examinations is right for you. This is a good chance for you to check in with your provider about disease prevention and staying healthy. In between checkups, there are plenty of things you can do on your own. Experts have done a lot of research about which lifestyle changes and preventive measures are most likely to keep you healthy. Ask your health care provider for more information. Weight and diet Eat a healthy diet  Be sure to include plenty of vegetables, fruits, low-fat dairy products, and lean protein.  Do not eat a lot of foods high in solid fats, added  sugars, or salt.  Get regular exercise. This is one of the most important things you can do for your health. ? Most adults should exercise for at least 150 minutes each week. The exercise should increase your heart rate and make you sweat (moderate-intensity exercise). ? Most adults should also do strengthening exercises at least twice a week. This is in addition to the moderate-intensity exercise. Maintain a healthy weight  Body mass index (BMI) is a measurement that can be used to identify possible weight problems. It estimates body fat based on height and weight. Your health care provider can help determine your BMI and help you achieve or maintain a healthy weight.  For females 57 years of age and older: ? A BMI below 18.5 is considered underweight. ? A BMI of 18.5 to 24.9 is normal. ? A BMI of 25 to 29.9 is considered overweight. ? A BMI of 30 and above is considered obese. Watch levels of cholesterol and blood lipids  You should start having your blood tested for lipids and cholesterol at 83 years of age, then have this test every 5 years.  You may need to have your cholesterol levels checked more often if: ? Your lipid or cholesterol levels are high. ? You are older than 83 years of age. ? You are at high risk for heart disease. Cancer screening Lung Cancer  Lung cancer screening is recommended for adults 84-36 years old who are at high risk for lung cancer because of a history of smoking.  A yearly low-dose CT scan of the lungs is recommended for people who: ? Currently smoke. ? Have quit within the past 15 years. ? Have at least a 30-pack-year history of smoking. A pack year is smoking an average of one pack of cigarettes a day for 1 year.  Yearly screening should continue until it has been 15 years since you quit.  Yearly screening should stop if you develop a health problem that would prevent you from having lung cancer treatment. Breast Cancer  Practice breast  self-awareness. This means understanding how your breasts normally appear and feel.  It also means doing regular breast self-exams. Let your health care provider know about any changes, no matter how small.  If you are in your 20s or 30s, you should have a clinical breast exam (CBE) by a health care provider every 1-3 years as part of a regular health exam.  If you are 77 or older, have a CBE every year. Also consider having a breast X-ray (mammogram) every year.  If you have a family history of breast cancer, talk to your health care provider about genetic screening.  If you are at high risk for breast cancer, talk to your health care provider about having an MRI and a mammogram every year.  Breast cancer gene (BRCA) assessment is recommended for women who have family members with BRCA-related cancers. BRCA-related cancers include: ? Breast. ? Ovarian. ? Tubal. ? Peritoneal cancers.  Results of the assessment will determine the need for genetic counseling and BRCA1 and BRCA2 testing. Cervical Cancer Your health care provider may recommend that you be screened regularly for cancer of the pelvic organs (ovaries, uterus, and vagina). This screening involves a pelvic examination, including checking for microscopic changes to the surface of your cervix (Pap test). You may be encouraged to have this screening done every 3 years, beginning at age 55.  For women ages 46-65, health care providers may recommend pelvic exams and Pap testing every 3 years, or they may recommend the Pap and pelvic exam, combined with testing for human papilloma virus (HPV), every 5 years. Some types of HPV increase your risk of cervical cancer. Testing for HPV may also be done on women of any age with unclear Pap test results.  Other health care providers may not recommend any screening for nonpregnant women who are considered low risk for pelvic cancer and who do not have symptoms. Ask your health care provider if a  screening pelvic exam is right for you.  If you have had past treatment for cervical cancer or a condition that could lead to cancer, you need Pap tests and screening for cancer for at least 20 years after your treatment. If Pap tests have been discontinued, your risk factors (such as having a new sexual partner) need to be reassessed to determine if screening should resume. Some women have medical problems that increase the chance of getting cervical cancer. In these cases, your health care provider may recommend more frequent screening and Pap tests. Colorectal Cancer  This type of cancer can be detected and often prevented.  Routine colorectal cancer screening usually begins at 83 years of age and continues through 83 years of age.  Your health care provider may recommend screening at an earlier age if you have risk factors for colon cancer.  Your health care provider may also recommend using home test kits to check for hidden blood in the stool.  A small camera at the end of a tube can be used to examine your colon directly (sigmoidoscopy or colonoscopy). This is done to check for the earliest forms of colorectal cancer.  Routine screening usually begins at age 65.  Direct examination of the colon should be repeated every 5-10 years through 83 years of age. However, you may need to be screened more often if early forms of precancerous polyps or small growths are found. Skin Cancer  Check your skin from head to toe regularly.  Tell your health care provider about any new moles or changes in moles, especially if there is a change in a mole's shape or color.  Also tell your health care provider if you have a mole that is larger than the size of a pencil eraser.  Always use sunscreen. Apply sunscreen liberally and repeatedly throughout the day.  Protect yourself by wearing long sleeves, pants, a wide-brimmed hat, and sunglasses whenever you are outside. Heart disease, diabetes, and high  blood pressure  High blood pressure causes heart disease and increases the risk of stroke. High blood pressure is more likely to develop in: ? People who have blood pressure in the high  end of the normal range (130-139/85-89 mm Hg). ? People who are overweight or obese. ? People who are African American.  If you are 24-22 years of age, have your blood pressure checked every 3-5 years. If you are 12 years of age or older, have your blood pressure checked every year. You should have your blood pressure measured twice-once when you are at a hospital or clinic, and once when you are not at a hospital or clinic. Record the average of the two measurements. To check your blood pressure when you are not at a hospital or clinic, you can use: ? An automated blood pressure machine at a pharmacy. ? A home blood pressure monitor.  If you are between 27 years and 67 years old, ask your health care provider if you should take aspirin to prevent strokes.  Have regular diabetes screenings. This involves taking a blood sample to check your fasting blood sugar level. ? If you are at a normal weight and have a low risk for diabetes, have this test once every three years after 83 years of age. ? If you are overweight and have a high risk for diabetes, consider being tested at a younger age or more often. Preventing infection Hepatitis B  If you have a higher risk for hepatitis B, you should be screened for this virus. You are considered at high risk for hepatitis B if: ? You were born in a country where hepatitis B is common. Ask your health care provider which countries are considered high risk. ? Your parents were born in a high-risk country, and you have not been immunized against hepatitis B (hepatitis B vaccine). ? You have HIV or AIDS. ? You use needles to inject street drugs. ? You live with someone who has hepatitis B. ? You have had sex with someone who has hepatitis B. ? You get hemodialysis  treatment. ? You take certain medicines for conditions, including cancer, organ transplantation, and autoimmune conditions. Hepatitis C  Blood testing is recommended for: ? Everyone born from [birth year] through 1963. ? Anyone with known risk factors for hepatitis C. Sexually transmitted infections (STIs)  You should be screened for sexually transmitted infections (STIs) including gonorrhea and chlamydia if: ? You are sexually active and are younger than 83 years of age. ? You are older than 83 years of age and your health care provider tells you that you are at risk for this type of infection. ? Your sexual activity has changed since you were last screened and you are at an increased risk for chlamydia or gonorrhea. Ask your health care provider if you are at risk.  If you do not have HIV, but are at risk, it may be recommended that you take a prescription medicine daily to prevent HIV infection. This is called pre-exposure prophylaxis (PrEP). You are considered at risk if: ? You are sexually active and do not regularly use condoms or know the HIV status of your partner(s). ? You take drugs by injection. ? You are sexually active with a partner who has HIV. Talk with your health care provider about whether you are at high risk of being infected with HIV. If you choose to begin PrEP, you should first be tested for HIV. You should then be tested every 3 months for as long as you are taking PrEP. Pregnancy  If you are premenopausal and you may become pregnant, ask your health care provider about preconception counseling.  If you may become pregnant, take  400 to 800 micrograms (mcg) of folic acid every day.  If you want to prevent pregnancy, talk to your health care provider about birth control (contraception). Osteoporosis and menopause  Osteoporosis is a disease in which the bones lose minerals and strength with aging. This can result in serious bone fractures. Your risk for osteoporosis can be  identified using a bone density scan.  If you are 27 years of age or older, or if you are at risk for osteoporosis and fractures, ask your health care provider if you should be screened.  Ask your health care provider whether you should take a calcium or vitamin D supplement to lower your risk for osteoporosis.  Menopause may have certain physical symptoms and risks.  Hormone replacement therapy may reduce some of these symptoms and risks. Talk to your health care provider about whether hormone replacement therapy is right for you. Follow these instructions at home:  Schedule regular health, dental, and eye exams.  Stay current with your immunizations.  Do not use any tobacco products including cigarettes, chewing tobacco, or electronic cigarettes.  If you are pregnant, do not drink alcohol.  If you are breastfeeding, limit how much and how often you drink alcohol.  Limit alcohol intake to no more than 1 drink per day for nonpregnant women. One drink equals 12 ounces of beer, 5 ounces of [place], or 1 ounces of hard liquor.  Do not use street drugs.  Do not share needles.  Ask your health care provider for help if you need support or information about quitting drugs.  Tell your health care provider if you often feel depressed.  Tell your health care provider if you have ever been abused or do not feel safe at home. This information is not intended to replace advice given to you by your health care provider. Make sure you discuss any questions you have with your health care provider. Document Released: 08/26/2010 Document Revised: 07/19/2015 Document Reviewed: 11/14/2014 Elsevier Interactive Patient Education  2019 [employer].

## 2018-03-15 NOTE — Progress Notes (Addendum)
Subjective:   Ann Booth is a 85 y.o. female who presents for Medicare Annual (Subsequent) preventive examination.  Review of Systems:  No ROS.  Medicare Wellness Visit. Additional risk factors are reflected in the social history.  Cardiac Risk Factors include: dyslipidemia;hypertension Sleep patterns: has interrupted sleep, gets up 1-2 times nightly to void and sleeps 6-7 hours nightly. Patient reports insomnia issues, discussed recommended sleep tips.    Home Safety/Smoke Alarms: Feels safe in home. Smoke alarms in place.  Living environment; residence and Firearm Safety: 1-story house/ trailer, equipment: Medical laboratory scientific officerCane, Type: Single [employer] Cane, no firearms.  Lives alone, no needs for DME, good support system Seat Belt Safety/Bike Helmet: Wears seat belt.     Objective:     Vitals: BP 112/78   Pulse 83   Resp 17   Ht 5\' 2"  (1.575 m)   Wt 129 lb (58.5 kg)   SpO2 99%   BMI 23.59 kg/m   Body mass index is 23.59 kg/m.  Advanced Directives 03/15/2018 10/23/2017 03/12/2017  Does Patient Have a Medical Advance Directive? No No No  Would patient like information on creating a medical advance directive? Yes (ED - Information included in AVS) - Yes (ED - Information included in AVS)    Tobacco Social History   Tobacco Use  Smoking Status Never Smoker  Smokeless Tobacco Never Used     Counseling given: Not Answered  Past Medical History:  Diagnosis Date  . ALLERGIC RHINITIS   . ANXIETY   . ASTHMA   . Atrial fibrillation (HCC)   . BACK PAIN   . Carotid stenosis 04/27/2012   Mild left on community screening  - Nov 2013  . CATARACT NOS   . DEPRESSION   . GLAUCOMA NOS   . HTN (hypertension)   . Hypercalcemia   . MITRAL VALVE PROLAPSE   . OSTEOPOROSIS   . PERIPHERAL NEUROPATHY   . POSITIONAL VERTIGO   . Rosacea    Past Surgical History:  Procedure Laterality Date  .  ankle surgury     left ankle  . CATARACT EXTRACTION, BILATERAL    . HEMORRHOID SURGERY    . posterior  chamber interocular lens implant     Family History  Problem Relation Age of Onset  . CAD Mother        MI at age 83  . Cancer Sister        colon  . Cancer Brother        prostate cancer   Social History   Socioeconomic History  . Marital status: Widowed    Spouse name: Not on file  . Number of children: 0  . Years of education: Not on file  . Highest education level: Not on file  Occupational History  . 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 Financial resource strain: Not hard at all  . Food insecurity:    Worry: Never true    Inability: Never true  . Transportation needs:    Medical: No    Non-medical: No  Tobacco Use  . Smoking status: Never Smoker  . Smokeless tobacco: Never Used  Substance and Sexual Activity  . Alcohol use: No  . Drug use: No  . Sexual activity: Never  Lifestyle  . Physical activity:    Days per week: 0 days    Minutes per session: 0 min  . Stress: Only a little  Relationships  . Social connections:  Talks on phone: More than three times a week    Gets together: More than three times a week    Attends religious service: More than 4 times per year    Active member of club or organization: Yes    Attends meetings of clubs or organizations: More than 4 times per year    Relationship status: Widowed  Other Topics Concern  . Not on file  Social History Narrative  . Not on file    Outpatient Encounter Medications as of 03/15/2018  Medication Sig  . acetaminophen-codeine (TYLENOL #3) 300-30 MG tablet Take 1 tablet by mouth every 8 (eight) hours as needed for moderate pain.  Marland Kitchen ALPRAZolam (XANAX) 0.5 MG tablet 1/2 - 1 tab by mouth at bedtime as needed  . b complex vitamins tablet Take 1 tablet by mouth daily.  . Biotin 1 MG CAPS Take 1 tablet by mouth daily.  . Calcium Carbonate-Vitamin D (CALCIUM + D PO) Take 1 tablet by mouth daily.   . Coenzyme Q10 (COQ-10) 100 MG CAPS Take 100 mg by mouth daily.   .  diclofenac sodium (VOLTAREN) 1 % GEL Apply 4 g topically 4 (four) times daily as needed.  . furosemide (LASIX) 20 MG tablet Take 1 tablet (20 mg total) by mouth daily.  Marland Kitchen glucosamine-chondroitin 500-400 MG tablet Take 1 tablet by mouth 2 (two) times daily.  . IRON, FERROUS GLUCONATE, PO Take 1 tablet by mouth 2 (two) times a week.   . lidocaine (LIDODERM) 5 % Place 1 patch onto the skin daily. Remove & Discard patch within 12 hours or as directed by MD  . loratadine (CLARITIN) 10 MG tablet Take 1 tablet (10 mg total) by mouth daily.  Marland Kitchen losartan (COZAAR) 100 MG tablet TAKE 1 TABLET BY MOUTH  DAILY  . LUTEIN PO Take 1 tablet by mouth daily.  . Magnesium Citrate 100 MG TABS Take 100 mg by mouth daily.   . Multiple Vitamins-Minerals (MACULAR VITAMIN BENEFIT PO) Take 2 tablets by mouth daily.  . Multiple Vitamins-Minerals (MULTIVITAMIN & MINERAL PO) Take 1 tablet by mouth daily.  . pantoprazole (PROTONIX) 20 MG tablet Take 1 tablet (20 mg total) by mouth daily.  . potassium chloride (K-DUR) 10 MEQ tablet Take 1 tablet (10 mEq total) by mouth daily.  . Rivaroxaban (XARELTO) 15 MG TABS tablet TAKE 1 TABLET BY MOUTH ONCE DAILY WITH SUPPER  . Thiamine HCl (B-1 PO) Take 1 tablet by mouth daily.   . timolol (BETIMOL) 0.5 % ophthalmic solution Place 1 drop into both eyes daily.  . timolol (TIMOPTIC) 0.5 % ophthalmic solution PLACE 1 DROP INTO BOTH EYES TWO TIMES DAILY  . vitamin B-12 (CYANOCOBALAMIN) 1000 MCG tablet Take 1,000 mcg by mouth every other day.    No facility-administered encounter medications on file as of 03/15/2018.     Activities of Daily Living In your present state of health, do you have any difficulty performing the following activities: 03/15/2018  Hearing? N  Vision? N  Difficulty concentrating or making decisions? N  Walking or climbing stairs? N  Dressing or bathing? N  Doing errands, shopping? N  Preparing Food and eating ? N  Using the Toilet? N  In the past six months,  have you accidently leaked urine? N  Do you have problems with loss of bowel control? N  Managing your Medications? N  Managing your Finances? N  Housekeeping or managing your Housekeeping? N  Some recent data might be hidden  Patient Care Team: Corwin Levins, MD as PCP - General Jens Som Madolyn Frieze, MD as Consulting Physician (Cardiology) Bond, Doran Stabler, MD as Referring Physician (Ophthalmology)    Assessment:   This is a routine wellness examination for Ann Booth. Physical assessment deferred to PCP.  Exercise Activities and Dietary recommendations Current Exercise Habits: The patient does not participate in regular exercise at present(chair exercise print-out provided), Exercise limited by: orthopedic condition(s) Diet (meal preparation, eat out, water intake, caffeinated beverages, dairy products, fruits and vegetables): in general, a "healthy" diet   Reports poor appetite.   Reviewed heart healthy diet.  Discussed supplementing with Ensure, samples and coupons provided. Encouraged patient to increase daily water and healthy fluid intake.  Goals    . Patient Stated     Increase physical activity by walking with my neighbors when they walk, check into [facility] and [employer]. Go bed a little earlier, do eye drops at 10:00 and 10:00 so I can go to bed earlier.     . Patient Stated     I will start to do chair exercises 2-3 times weekly. Start walking with my neighbors in the afternoon.        Fall Risk Fall Risk  03/15/2018 10/05/2017 03/12/2017 06/26/2016 03/08/2015  Falls in the past year? 0 Yes Yes No No  Number falls in past yr: - 1 1 - -  Injury with Fall? - No - - -  Risk for fall due to : Impaired mobility;Impaired balance/gait - - - -  Follow up Falls prevention discussed;Education provided - Falls prevention discussed;Education provided - -    Depression Screen PHQ 2/9 Scores 03/15/2018 10/05/2017 03/12/2017 06/26/2016  PHQ - 2 Score 1 0 2 0  PHQ- 9 Score 6 - 8  -     Cognitive Function MMSE - Mini Mental State Exam 03/15/2018 03/12/2017  Orientation to time 5 5  Orientation to Place 5 5  Registration 3 3  Attention/ Calculation 5 5  Recall 1 3  Language- name 2 objects 2 2  Language- repeat 1 1  Language- follow 3 step command 3 3  Language- read & follow direction 1 1  Write a sentence 1 1  Copy design 1 1  Total score 28 30        Immunization History  Administered Date(s) Administered  . Influenza Split 11/21/2010, 10/31/2011  . Influenza, High Dose Seasonal PF 11/05/2012, 11/25/2016  . Influenza,inj,Quad PF,6+ Mos 10/12/2013, 12/07/2014  . Influenza-Unspecified 11/25/2016  . Pneumococcal Conjugate-13 07/28/2013  . Pneumococcal Polysaccharide-23 03/04/2006  . Td 01/29/2010  . Zoster 02/07/2009, 02/12/2009   Screening Tests Health Maintenance  Topic Date Due  . INFLUENZA VACCINE  05/26/2018 (Originally 09/24/2017)  . TETANUS/TDAP  01/30/2020  . DEXA SCAN  Completed  . PNA vac Low Risk Adult  Completed      Plan:     Reviewed health maintenance screenings with patient today and relevant education, vaccines, and/or referrals were provided.   Continue doing brain stimulating activities (puzzles, reading, adult coloring books, staying active) to keep memory sharp.   Continue to eat heart healthy diet (full of fruits, vegetables, whole grains, lean protein, water--limit salt, fat, and sugar intake) and increase physical activity as tolerated.   I have personally reviewed and noted the following in the patient's chart:   . Medical and social history . Use of alcohol, tobacco or illicit drugs  . Current medications and supplements . Functional ability and status . Nutritional status .  Physical activity . Advanced directives . List of other physicians . Vitals . Screenings to include cognitive, depression, and falls . Referrals and appointments  In addition, I have reviewed and discussed with patient certain preventive  protocols, quality metrics, and best practice recommendations. A written personalized care plan for preventive services as well as general preventive health recommendations were provided to patient.     Wanda Plump, RN  03/15/2018    Medical screening examination/treatment/procedure(s) were performed by non-physician practitioner and as supervising physician I was immediately available for consultation/collaboration. I agree with above. Oliver Barre, MD

## 2018-03-18 NOTE — Progress Notes (Signed)
HPI: FU atrial fibrillation. Monitor in August of 2014 showed PAF. Toprol DCed previously due to bradycardia. Last echocardiogram November 2019 showed normal LV function, prolapse of anterior mitral valve leaflet with moderate mitral regurgitation, severe left atrial enlargement, moderate to severe tricuspid regurgitation.  Patient seen in the emergency room after a fall on February 1. Since I last saw her,she has occasional dyspnea.  No chest pain, palpitations or syncope.  She is having difficulties with her balance.  She has fallen several times.  Current Outpatient Medications  Medication Sig Dispense Refill  . ALPRAZolam (XANAX) 0.5 MG tablet 1/2 - 1 tab by mouth at bedtime as needed (Patient taking differently: Take 0.25-0.5 mg by mouth daily as needed for anxiety or sleep. ) 90 tablet 1  . b complex vitamins tablet Take 1 tablet by mouth daily.    . Biotin 1 MG CAPS Take 1 tablet by mouth daily.    . Calcium Carbonate-Vitamin D (CALCIUM + D PO) Take 1 tablet by mouth daily.     . Coenzyme Q10 (COQ-10) 100 MG CAPS Take 100 mg by mouth daily.     . IRON, FERROUS GLUCONATE, PO Take 1 tablet by mouth every Monday, Wednesday, and Friday.     . lidocaine (LIDODERM) 5 % Place 1 patch onto the skin daily. Remove & Discard patch within 12 hours or as directed by MD 60 patch 1  . losartan (COZAAR) 100 MG tablet TAKE 1 TABLET BY MOUTH  DAILY 90 tablet 3  . LUTEIN PO Take 1 tablet by mouth daily.    . Magnesium Citrate 100 MG TABS Take 100 mg by mouth daily.     . Multiple Vitamins-Minerals (MACULAR VITAMIN BENEFIT PO) Take 2 tablets by mouth daily.    . Multiple Vitamins-Minerals (MULTIVITAMIN & MINERAL PO) Take 1 tablet by mouth daily.    . pantoprazole (PROTONIX) 20 MG tablet Take 1 tablet (20 mg total) by mouth daily. 30 tablet 1  . Rivaroxaban (XARELTO) 15 MG TABS tablet TAKE 1 TABLET BY MOUTH ONCE DAILY WITH SUPPER 90 tablet 1  . Thiamine HCl (B-1 PO) Take 1 tablet by mouth daily.       . timolol (BETIMOL) 0.5 % ophthalmic solution Place 1 drop into both eyes 2 (two) times daily.     . vitamin B-12 (CYANOCOBALAMIN) 1000 MCG tablet Take 1,000 mcg by mouth every other day.      No current facility-administered medications for this visit.      Past Medical History:  Diagnosis Date  . ALLERGIC RHINITIS   . ANXIETY   . ASTHMA   . Atrial fibrillation (HCC)   . BACK PAIN   . Carotid stenosis 04/27/2012   Mild left on community screening  - Nov 2013  . CATARACT NOS   . DEPRESSION   . GLAUCOMA NOS   . HTN (hypertension)   . Hypercalcemia   . MITRAL VALVE PROLAPSE   . OSTEOPOROSIS   . PERIPHERAL NEUROPATHY   . POSITIONAL VERTIGO   . Rosacea     Past Surgical History:  Procedure Laterality Date  .  ankle surgury     left ankle  . CATARACT EXTRACTION, BILATERAL    . HEMORRHOID SURGERY    . posterior chamber interocular lens implant      Social History   Socioeconomic History  . Marital status: Widowed    Spouse name: Not on file  . Number of children: 0  . Years of  education: Not on file  . Highest education level: Not on file  Occupational History  . Occupation: retired Control and instrumentation engineer: [employer]  Social Needs  . Financial resource strain: Not hard at all  . Food insecurity:    Worry: Never true    Inability: Never true  . Transportation needs:    Medical: No    Non-medical: No  Tobacco Use  . Smoking status: Never Smoker  . Smokeless tobacco: Never Used  Substance and Sexual Activity  . Alcohol use: No  . Drug use: No  . Sexual activity: Never  Lifestyle  . Physical activity:    Days per week: 0 days    Minutes per session: 0 min  . Stress: Only a little  Relationships  . Social connections:    Talks on phone: More than three times a week    Gets together: More than three times a week    Attends religious service: More than 4 times per year    Active member of club or organization: Yes    Attends meetings of clubs or  organizations: More than 4 times per year    Relationship status: Widowed  . Intimate partner violence:    Fear of current or ex partner: Not on file    Emotionally abused: Not on file    Physically abused: Not on file    Forced sexual activity: Not on file  Other Topics Concern  . Not on file  Social History Narrative  . Not on file    Family History  Problem Relation Age of Onset  . CAD Mother        MI at age 41  . Cancer Sister        colon  . Cancer Brother        prostate cancer    ROS: no fevers or chills, productive cough, hemoptysis, dysphasia, odynophagia, melena, hematochezia, dysuria, hematuria, rash, seizure activity, orthopnea, PND, pedal edema, claudication. Remaining systems are negative.  Physical Exam: Well-developed frail in no acute distress.  Skin is warm and dry.  HEENT is normal.  Neck is supple.  Chest is clear to auscultation with normal expansion.  Cardiovascular exam is irregular Abdominal exam nontender or distended. No masses palpated. Extremities show no edema. neuro grossly intact  ECG-atrial fibrillation at a rate of 86, no ST changes, left axis deviation.  Personally reviewed  A/P  1 permanent atrial fibrillation-plan to continue Xarelto.  Long discussion today concerning risks and benefits.  She has fallen recently and is having difficulties with balance.  We have agreed to continue Xarelto for now.  She is scheduled to have physical therapy and is also agreed to use a walker.  If she has more frequent falls then we will discontinue in the future with the understanding her risk of CVA would be higher.  Rate is controlled on no medications.  Our plan will be rate control and anticoagulation.    2 acute diastolic congestive heart failure-patient appears to be euvolemic on examination.  Continue present dose of Lasix as needed.  Continue fluid restriction and low-sodium diet.  3 moderate mitral regurgitation/moderate to severe tricuspid  regurgitation-would like to be conservative for valvular heart disease given patient's age.  4 hypertension-patient's blood pressure is mildly decreased.  Decrease losartan to 50 mg daily.  Olga Millers, MD

## 2018-03-27 ENCOUNTER — Emergency Department (HOSPITAL_COMMUNITY)
Admission: EM | Admit: 2018-03-27 | Discharge: 2018-03-28 | Disposition: A | Discharge status: Home / Self Care | Payer: PPO | Attending: Emergency Medicine | Admitting: Emergency Medicine

## 2018-03-27 ENCOUNTER — Other Ambulatory Visit: Payer: Self-pay

## 2018-03-27 ENCOUNTER — Emergency Department (HOSPITAL_COMMUNITY): Payer: PPO

## 2018-03-27 ENCOUNTER — Encounter (HOSPITAL_COMMUNITY): Payer: Self-pay | Admitting: Internal Medicine

## 2018-03-27 DIAGNOSIS — I4891 Unspecified atrial fibrillation: Secondary | ICD-10-CM | POA: Diagnosis present

## 2018-03-27 DIAGNOSIS — S3992XA Unspecified injury of lower back, initial encounter: Secondary | ICD-10-CM | POA: Insufficient documentation

## 2018-03-27 DIAGNOSIS — R296 Repeated falls: Secondary | ICD-10-CM

## 2018-03-27 DIAGNOSIS — S299XXA Unspecified injury of thorax, initial encounter: Secondary | ICD-10-CM | POA: Diagnosis not present

## 2018-03-27 DIAGNOSIS — Y92019 Unspecified place in single-family (private) house as the place of occurrence of the external cause: Secondary | ICD-10-CM | POA: Insufficient documentation

## 2018-03-27 DIAGNOSIS — Y939 Activity, unspecified: Secondary | ICD-10-CM | POA: Diagnosis not present

## 2018-03-27 DIAGNOSIS — Z7901 Long term (current) use of anticoagulants: Secondary | ICD-10-CM | POA: Insufficient documentation

## 2018-03-27 DIAGNOSIS — M546 Pain in thoracic spine: Secondary | ICD-10-CM | POA: Diagnosis not present

## 2018-03-27 DIAGNOSIS — S0101XA Laceration without foreign body of scalp, initial encounter: Secondary | ICD-10-CM | POA: Diagnosis not present

## 2018-03-27 DIAGNOSIS — Z79899 Other long term (current) drug therapy: Secondary | ICD-10-CM | POA: Diagnosis not present

## 2018-03-27 DIAGNOSIS — W19XXXA Unspecified fall, initial encounter: Secondary | ICD-10-CM

## 2018-03-27 DIAGNOSIS — M545 Low back pain: Secondary | ICD-10-CM | POA: Diagnosis not present

## 2018-03-27 DIAGNOSIS — W1830XA Fall on same level, unspecified, initial encounter: Secondary | ICD-10-CM | POA: Insufficient documentation

## 2018-03-27 DIAGNOSIS — Z23 Encounter for immunization: Secondary | ICD-10-CM | POA: Diagnosis not present

## 2018-03-27 DIAGNOSIS — M5489 Other dorsalgia: Secondary | ICD-10-CM | POA: Diagnosis not present

## 2018-03-27 DIAGNOSIS — R52 Pain, unspecified: Secondary | ICD-10-CM | POA: Diagnosis not present

## 2018-03-27 DIAGNOSIS — Y999 Unspecified external cause status: Secondary | ICD-10-CM | POA: Insufficient documentation

## 2018-03-27 DIAGNOSIS — S0990XA Unspecified injury of head, initial encounter: Secondary | ICD-10-CM | POA: Diagnosis not present

## 2018-03-27 DIAGNOSIS — S199XXA Unspecified injury of neck, initial encounter: Secondary | ICD-10-CM | POA: Diagnosis not present

## 2018-03-27 DIAGNOSIS — I10 Essential (primary) hypertension: Secondary | ICD-10-CM | POA: Diagnosis not present

## 2018-03-27 LAB — BASIC METABOLIC PANEL
Anion gap: 11 (ref 5–15)
BUN: 22 mg/dL (ref 8–23)
CO2: 22 mmol/L (ref 22–32)
Calcium: 9.8 mg/dL (ref 8.9–10.3)
Chloride: 107 mmol/L (ref 98–111)
Creatinine, Ser: 0.92 mg/dL (ref 0.44–1.00)
GFR calc Af Amer: >60 mL/min (ref >60)
GFR calc non Af Amer: 55 mL/min — ABNORMAL LOW (ref >60)
Glucose, Bld: 144 mg/dL — ABNORMAL HIGH (ref 70–99)
Potassium: 4.4 mmol/L (ref 3.5–5.1)
Sodium: 140 mmol/L (ref 135–145)

## 2018-03-27 LAB — CBC WITH DIFFERENTIAL/PLATELET
ABS IMMATURE GRANULOCYTES: 0.04 10*3/uL (ref 0.00–0.07)
BASOS PCT: 1 %
Basophils Absolute: 0 10*3/uL (ref 0.0–0.1)
EOS PCT: 1 %
Eosinophils Absolute: 0 10*3/uL (ref 0.0–0.5)
HCT: 41.2 % (ref 36.0–46.0)
HEMOGLOBIN: 13.5 g/dL (ref 12.0–15.0)
Immature Granulocytes: 1 %
Lymphocytes Relative: 9 %
Lymphs Abs: 0.7 10*3/uL (ref 0.7–4.0)
MCH: 32.1 pg (ref 26.0–34.0)
MCHC: 32.8 g/dL (ref 30.0–36.0)
MCV: 97.9 fL (ref 80.0–100.0)
MONO ABS: 0.6 10*3/uL (ref 0.1–1.0)
MONOS PCT: 7 %
NEUTROS ABS: 6.4 10*3/uL (ref 1.7–7.7)
Neutrophils Relative %: 81 %
Platelets: 172 10*3/uL (ref 150–400)
RBC: 4.21 MIL/uL (ref 3.87–5.11)
RDW: 14.5 % (ref 11.5–15.5)
WBC: 7.9 10*3/uL (ref 4.0–10.5)
nRBC: 0 % (ref 0.0–0.2)

## 2018-03-27 LAB — URINALYSIS, ROUTINE W REFLEX MICROSCOPIC
BACTERIA UA: NONE SEEN
BILIRUBIN URINE: NEGATIVE
GLUCOSE, UA: NEGATIVE mg/dL
HGB URINE DIPSTICK: NEGATIVE
KETONES UR: 5 mg/dL — AB
NITRITE: NEGATIVE
PROTEIN: NEGATIVE mg/dL
Specific Gravity, Urine: 1.012 (ref 1.005–1.030)
pH: 7 (ref 5.0–8.0)

## 2018-03-27 MED ORDER — VITAMIN B-12 1000 MCG PO TABS: 1000.0000 ug | ORAL_TABLET | ORAL | Status: DC

## 2018-03-27 MED ORDER — LOSARTAN POTASSIUM 50 MG PO TABS
100.0000 mg | ORAL_TABLET | Freq: Every day | ORAL | Status: DC
  Administered 2018-03-28: 100 mg via ORAL
  Filled 2018-03-27: qty 2

## 2018-03-27 MED ORDER — POTASSIUM CHLORIDE CRYS ER 10 MEQ PO TBCR: 10.0000 meq | EXTENDED_RELEASE_TABLET | Freq: Every day | ORAL | Status: DC

## 2018-03-27 MED ORDER — TETANUS-DIPHTH-ACELL PERTUSSIS 5-2.5-18.5 LF-MCG/0.5 IM SUSP
0.5000 mL | Freq: Once | INTRAMUSCULAR | Status: AC
  Administered 2018-03-27: 0.5 mL via INTRAMUSCULAR
  Filled 2018-03-27: qty 0.5

## 2018-03-27 MED ORDER — CALCIUM CARBONATE-VITAMIN D 500-200 MG-UNIT PO TABS
1.0000 | ORAL_TABLET | Freq: Every day | ORAL | Status: DC
  Administered 2018-03-28: 1 via ORAL
  Filled 2018-03-27: qty 1

## 2018-03-27 MED ORDER — FUROSEMIDE 20 MG PO TABS: 20.0000 mg | ORAL_TABLET | Freq: Every day | ORAL | Status: DC

## 2018-03-27 MED ORDER — BIOTIN 1 MG PO CAPS: 1.0000 | ORAL_CAPSULE | Freq: Every day | ORAL | Status: DC

## 2018-03-27 MED ORDER — B COMPLEX-C PO TABS
1.0000 | ORAL_TABLET | Freq: Every day | ORAL | Status: DC
  Filled 2018-03-27 (×2): qty 1

## 2018-03-27 MED ORDER — B-1 100 MG PO TABS: ORAL_TABLET | Freq: Every day | ORAL | Status: DC

## 2018-03-27 MED ORDER — RIVAROXABAN 15 MG PO TABS: 15.0000 mg | ORAL_TABLET | Freq: Every day | ORAL | Status: DC

## 2018-03-27 MED ORDER — TIMOLOL MALEATE 0.5 % OP SOLN
1.0000 [drp] | Freq: Two times a day (BID) | OPHTHALMIC | Status: DC
  Administered 2018-03-27 – 2018-03-28 (×2): 1 [drp] via OPHTHALMIC
  Filled 2018-03-27: qty 5

## 2018-03-27 MED ORDER — ACETAMINOPHEN 325 MG PO TABS
650.0000 mg | ORAL_TABLET | Freq: Once | ORAL | Status: AC
  Administered 2018-03-27: 650 mg via ORAL
  Filled 2018-03-27: qty 2

## 2018-03-27 MED ORDER — GLUCOSAMINE-CHONDROITIN 500-400 MG PO TABS: 1.0000 | ORAL_TABLET | Freq: Two times a day (BID) | ORAL | Status: DC

## 2018-03-27 MED ORDER — SODIUM CHLORIDE 0.9 % IV BOLUS
500.0000 mL | Freq: Once | INTRAVENOUS | Status: AC
  Administered 2018-03-27: 500 mL via INTRAVENOUS

## 2018-03-27 NOTE — Progress Notes (Signed)
PHARMACIST - PHYSICIAN ORDER COMMUNICATION  CONCERNING: P&T Medication Policy on Herbal Medications  DESCRIPTION:  This patient's order for:  Biotin and Glucosamine Chondroitin  has been noted.  This product(s) is classified as an "herbal" or natural product. Due to a lack of definitive safety studies or FDA approval, nonstandard manufacturing practices, plus the potential risk of unknown drug-drug interactions while on inpatient medications, the Pharmacy and Therapeutics Committee does not permit the use of "herbal" or natural products of this type within [place].   ACTION TAKEN: The pharmacy department is unable to verify this order at this time and your patient has been informed of this safety policy. Please reevaluate patient's clinical condition at discharge and address if the herbal or natural product(s) should be resumed at that time.   

## 2018-03-27 NOTE — ED Notes (Signed)
Pt asking for gum to stop her indigestion

## 2018-03-27 NOTE — ED Triage Notes (Signed)
Pt BIB [facility]. Pt had two falls at home. Pt reports she lost her balance and fell. Pt reports that the second fall left her on the floor since 0230 this morning. Pt does have a laceration to the back of her head from the fall. Pt denies any head or neck pain upon arrival to the ED. Pt complains of back pain and chest pain that she states occurred after the falls. Pt is alert and oriented x4 at present.

## 2018-03-27 NOTE — ED Notes (Signed)
Pt assisted to standing with 2 staff members- ambulated slowly with 2 standby assist and walker to bathroom. Was unable to stand from bed without help. Pt lives alone and staff is unable to get in touch with family.

## 2018-03-27 NOTE — Evaluation (Signed)
Physical Therapy Evaluation Patient Details Name: Ann Booth MRN: [id number] DOB: 04/26/1926 Today's Date: 03/27/2018   History of Present Illness  Pt admit after 2 falls at home and laid on floor for several hours prior to getting help.  Scalp laceration.  Pt reported that she had back pain and chest pain after falls. CT negative. No evidence of fractures.   Clinical Impression  Pt admitted with above diagnosis. Pt currently with functional limitations due to the deficits listed below (see PT Problem List). Pt was able to ambulate with RW needing min assist and cues for safety.  Pt very fearful of falling and is a safety risk. Given that pt lives alone, recommend SNF for therapy to gain strength and endurance prior to going home.  Will follow acutely.   Pt will benefit from skilled PT to increase their independence and safety with mobility to allow discharge to the venue listed below.      Follow Up Recommendations SNF;Supervision/Assistance - 24 hour    Equipment Recommendations  Rolling walker with 5" wheels;3in1 (PT)    Recommendations for Other Services       Precautions / Restrictions Precautions Precautions: Fall Restrictions Weight Bearing Restrictions: No      Mobility  Bed Mobility Overal bed mobility: Needs Assistance Bed Mobility: Supine to Sit;Sit to Supine     Supine to sit: Mod assist;+2 for physical assistance Sit to supine: Mod assist;+2 for physical assistance   General bed mobility comments: Needed assist off and on stretcher for trunk and LEs.   Transfers Overall transfer level: Needs assistance Equipment used: Rolling walker (2 wheeled) Transfers: Sit to/from Stand Sit to Stand: Min assist;+2 safety/equipment         General transfer comment: Steadying assist needed initially.   Ambulation/Gait Ambulation/Gait assistance: Min assist;+2 safety/equipment Gait Distance (Feet): 75 Feet Assistive device: Rolling walker (2 wheeled) Gait  Pattern/deviations: Step-through pattern;Decreased stride length;Drifts right/left;Trunk flexed;Narrow base of support   Gait velocity interpretation: <1.31 ft/sec, indicative of household ambulator General Gait Details: Pt is able to ambulate with min assist with RW.  Used cane PTA but dependent on UEs for balance. Pt states she is fearful of falling and sees double sometimes.  Denies dizziness wtih ambulation. Needed cues for proxiity to RW.   Stairs            Wheelchair Mobility    Modified Rankin (Stroke Patients Only)       Balance Overall balance assessment: Needs assistance Sitting-balance support: No upper extremity supported;Feet supported Sitting balance-Leahy Scale: Poor Sitting balance - Comments: Needed min assist at times as pt would be sitting and then would just fall to right without warning and needed min assist to sit back to midline.  Postural control: Posterior lean;Right lateral lean Standing balance support: Bilateral upper extremity supported;During functional activity Standing balance-Leahy Scale: Poor Standing balance comment: relies on RW for balance.                              Pertinent Vitals/Pain Pain Assessment: Faces Faces Pain Scale: Hurts even more Pain Location: upper back Pain Descriptors / Indicators: Aching;Grimacing;Guarding Pain Intervention(s): Limited activity within patient's tolerance;Monitored during session;Repositioned    Home Living Family/patient expects to be discharged to:: Private residence Living Arrangements: Alone Available Help at Discharge: Family;Available PRN/intermittently(sister) Type of Home: House Home Access: Ramped entrance     Home Layout: One level Home Equipment: Cane - single point;Walker -  2 wheels Additional Comments: unsure what equipment she has per pt    Prior Function Level of Independence: Independent with assistive device(s)         Comments: used cane PTA.  States she  cooked but she didn't clean.       Hand Dominance        Extremity/Trunk Assessment   Upper Extremity Assessment Upper Extremity Assessment: Defer to OT evaluation    Lower Extremity Assessment Lower Extremity Assessment: Generalized weakness    Cervical / Trunk Assessment Cervical / Trunk Assessment: Kyphotic  Communication   Communication: No difficulties  Cognition Arousal/Alertness: Awake/alert Behavior During Therapy: WFL for tasks assessed/performed Overall Cognitive Status: Within Functional Limits for tasks assessed                                        General Comments General comments (skin integrity, edema, etc.): Checked pt for BPPV given that she had a fall but could not definitively determine if pt had any symptoms.  Could not test properly due to stretcher.  However once pt up, she did not seem to have issues with head turns.  Possible issue with gaze stability but again no definitive results.  Pt did report that she sees double at times.     Exercises     Assessment/Plan    PT Assessment Patient needs continued PT services  PT Problem List Decreased activity tolerance;Decreased balance;Decreased mobility;Decreased knowledge of use of DME;Decreased safety awareness;Decreased knowledge of precautions;Pain       PT Treatment Interventions DME instruction;Gait training;Functional mobility training;Therapeutic activities;Therapeutic exercise;Balance training;Patient/family education    PT Goals (Current goals can be found in the Care Plan section)  Acute Rehab PT Goals Patient Stated Goal: to go to REhab PT Goal Formulation: With patient Time For Goal Achievement: 04/10/18 Potential to Achieve Goals: Good    Frequency Min 2X/week   Barriers to discharge Decreased caregiver support      Co-evaluation               AM-PAC PT "6 Clicks" Mobility  Outcome Measure Help needed turning from your back to your side while in a flat  bed without using bedrails?: A Lot Help needed moving from lying on your back to sitting on the side of a flat bed without using bedrails?: A Lot Help needed moving to and from a bed to a chair (including a wheelchair)?: A Lot Help needed standing up from a chair using your arms (e.g., wheelchair or bedside chair)?: A Lot Help needed to walk in hospital room?: A Little Help needed climbing 3-5 steps with a railing? : A Lot 6 Click Score: 13    End of Session Equipment Utilized During Treatment: Gait belt Activity Tolerance: Patient limited by fatigue;Patient limited by pain Patient left: in bed;with call bell/phone within reach Nurse Communication: Mobility status PT Visit Diagnosis: Unsteadiness on feet (R26.81);Muscle weakness (generalized) (M62.81);History of falling (Z91.81);Other (comment);Pain Pain - part of body: (upper back)    Time: 1207-1226 PT Time Calculation (min) (ACUTE ONLY): 19 min   Charges:   PT Evaluation $PT Eval Moderate Complexity: 1 Mod          Charnee Turnipseed,PT Acute Rehabilitation Services Pager:  [phone]  Office:  [phone]    [name] 03/27/2018, 1:22 PM

## 2018-03-27 NOTE — Progress Notes (Signed)
CSW met with patient to discuss SNF options and home resources. CSW noted patient refused SNF and stated she wanted to return home and "I'm fine, I can drive my own car, I don't need a nursing home." CSW noted patient was somewhat receptive to home health resources and informed RNCM.  Lamonte Richer, LCSW, [place] Worker II [phone]

## 2018-03-27 NOTE — ED Notes (Signed)
Patient transported to X-ray 

## 2018-03-27 NOTE — ED Notes (Signed)
Pt finished eating her dinner.  Eye drops placed in her eyes for glacoma

## 2018-03-27 NOTE — Consult Note (Signed)
ER Consult Note   Ann Booth UJW:[id number] DOB: 12-09-1926 DOA: 03/27/2018  PCP: Corwin Levins, MD Consultants:  Jens Som - cardiology; Bond - eye Patient coming from:  Home - lives alone; NOK:  Sister, [phone]  Chief Complaint: Falls  HPI: Ann Booth is a 85 y.o. female with medical history significant of HTN; afib presenting with falls.  She lost her balance and fell twice.  She does not tend to fall according to her - but family reports that she does.  Now, she is very tired and sleepy and a little hungry and she is in pain in her back.  The PT told her she wasn't picking up her feet well.    ED Course:   "Can't walk" after falls.  No clear cause.  She does not have support at home.  PT able to ambulate with a walker, recommends SNF.  Patient declines SNF.  Family can't support at home.  Review of Systems: As per HPI; otherwise review of systems reviewed and negative.   Ambulatory Status:  Ambulates without assistance  Past Medical History:  Diagnosis Date  . ALLERGIC RHINITIS   . ANXIETY   . ASTHMA   . Atrial fibrillation (HCC)   . BACK PAIN   . Carotid stenosis 04/27/2012   Mild left on community screening  - Nov 2013  . CATARACT NOS   . DEPRESSION   . GLAUCOMA NOS   . HTN (hypertension)   . Hypercalcemia   . MITRAL VALVE PROLAPSE   . OSTEOPOROSIS   . PERIPHERAL NEUROPATHY   . POSITIONAL VERTIGO   . Rosacea     Past Surgical History:  Procedure Laterality Date  .  ankle surgury     left ankle  . CATARACT EXTRACTION, BILATERAL    . HEMORRHOID SURGERY    . posterior chamber interocular lens implant      Social History   Socioeconomic History  . Marital status: Widowed    Spouse name: Not on file  . Number of children: 0  . Years of education: Not on file  . Highest education level: Not on file  Occupational History  . Occupation: retired Control and instrumentation engineer: RETIRED  Social Needs  . Financial resource strain: Not hard at all  . Food  insecurity:    Worry: Never true    Inability: Never true  . Transportation needs:    Medical: No    Non-medical: No  Tobacco Use  . Smoking status: Never Smoker  . Smokeless tobacco: Never Used  Substance and Sexual Activity  . Alcohol use: No  . Drug use: No  . Sexual activity: Never  Lifestyle  . Physical activity:    Days per week: 0 days    Minutes per session: 0 min  . Stress: Only a little  Relationships  . Social connections:    Talks on phone: More than three times a week    Gets together: More than three times a week    Attends religious service: More than 4 times per year    Active member of club or organization: Yes    Attends meetings of clubs or organizations: More than 4 times per year    Relationship status: Widowed  . Intimate partner violence:    Fear of current or ex partner: Not on file    Emotionally abused: Not on file    Physically abused: Not on file    Forced sexual activity: Not on file  Other Topics Concern  . Not on file  Social History Narrative  . Not on file    Allergies  Allergen Reactions  . Cymbalta [Duloxetine Hcl] Other (See Comments)    Family History  Problem Relation Age of Onset  . CAD Mother        MI at age 5  . Cancer Sister        colon  . Cancer Brother        prostate cancer    Prior to Admission medications   Medication Sig Start Date End Date Taking? Authorizing Provider  ALPRAZolam (XANAX) 0.5 MG tablet 1/2 - 1 tab by mouth at bedtime as needed Patient taking differently: Take 0.25-0.5 mg by mouth daily as needed for anxiety or sleep.  03/16/17  Yes Corwin Levins, MD  b complex vitamins tablet Take 1 tablet by mouth daily.   Yes [provider]  Biotin 1 MG CAPS Take 1 tablet by mouth daily.   Yes [provider]  Calcium Carbonate-Vitamin D (CALCIUM + D PO) Take 1 tablet by mouth daily.    Yes [provider]  Coenzyme Q10 (COQ-10) 100 MG CAPS Take 100 mg by mouth daily.    Yes  [provider]  diclofenac sodium (VOLTAREN) 1 % GEL Apply 4 g topically 4 (four) times daily as needed. 03/16/17  Yes Corwin Levins, MD  furosemide (LASIX) 20 MG tablet Take 1 tablet (20 mg total) by mouth daily. 12/22/17  Yes Lewayne Bunting, MD  glucosamine-chondroitin 500-400 MG tablet Take 1 tablet by mouth 2 (two) times daily.   Yes [provider]  IRON, FERROUS GLUCONATE, PO Take 1 tablet by mouth every Monday, Wednesday, and Friday.    Yes [provider]  loratadine (CLARITIN) 10 MG tablet Take 1 tablet (10 mg total) by mouth daily. 01/13/18  Yes Olive Bass, FNP  losartan (COZAAR) 100 MG tablet TAKE 1 TABLET BY MOUTH  DAILY 04/20/17  Yes Corwin Levins, MD  LUTEIN PO Take 1 tablet by mouth daily.   Yes [provider]  Magnesium Citrate 100 MG TABS Take 100 mg by mouth daily.    Yes [provider]  Multiple Vitamins-Minerals (MACULAR VITAMIN BENEFIT PO) Take 2 tablets by mouth daily.   Yes [provider]  Multiple Vitamins-Minerals (MULTIVITAMIN & MINERAL PO) Take 1 tablet by mouth daily.   Yes [provider]  potassium chloride (K-DUR) 10 MEQ tablet Take 1 tablet (10 mEq total) by mouth daily. 01/13/18  Yes Olive Bass, FNP  Rivaroxaban (XARELTO) 15 MG TABS tablet TAKE 1 TABLET BY MOUTH ONCE DAILY WITH SUPPER 12/28/17  Yes Lewayne Bunting, MD  Thiamine HCl (B-1 PO) Take 1 tablet by mouth daily.    Yes [provider]  timolol (BETIMOL) 0.5 % ophthalmic solution Place 1 drop into both eyes 2 (two) times daily.    Yes [provider]  vitamin B-12 (CYANOCOBALAMIN) 1000 MCG tablet Take 1,000 mcg by mouth every other day.    Yes [provider]  acetaminophen-codeine (TYLENOL #3) 300-30 MG tablet Take 1 tablet by mouth every 8 (eight) hours as needed for moderate pain. Patient not taking: Reported on 03/27/2018 10/05/17   Corwin Levins, MD  lidocaine (LIDODERM) 5 % Place 1 patch  onto the skin daily. Remove & Discard patch within 12 hours or as directed by MD Patient not taking: Reported on 03/27/2018 10/05/17   Corwin Levins, MD  pantoprazole (PROTONIX) 20 MG tablet Take 1 tablet (20 mg total) by mouth daily. Patient not taking: Reported on 03/27/2018 12/08/17   Olive Bass, FNP    Physical Exam: Vitals:   03/27/18 1158 03/27/18 1200 03/27/18 1230 03/27/18 1258  BP: (!) 152/83 134/79 (!) 146/83 (!) 147/74  Pulse: (!) 101 (!) 104  96  Resp: 14   15  Temp:      TempSrc:      SpO2: 97% 99%  99%  Weight:      Height:         . General:  Appears calm and comfortable and is NAD; she has a bandage around her head with staples across her occiput . Eyes:   EOMI, normal lids, iris . ENT:  grossly normal hearing, lips & tongue, mmm . Neck:  no LAD, masses or thyromegaly . Cardiovascular:  Afib with rate about 100, no m/r/g. No LE edema.  Marland Kitchen Respiratory:   CTA bilaterally with no wheezes/rales/rhonchi.  Normal respiratory effort. . Abdomen:  soft, NT, ND, NABS . Back:   normal alignment, no CVAT . Skin:  no rash or induration seen on limited exam . Musculoskeletal:  grossly normal tone BUE/BLE, good ROM, no bony abnormality . Psychiatric: grossly normal mood and affect, speech fluent and appropriate, AOx3 . Neurologic:  CN 2-12 grossly intact, moves all extremities in coordinated fashion, sensation intact    Radiological Exams on Admission: Dg Thoracic Spine 2 View  Result Date: 03/27/2018 CLINICAL DATA:  Back pain after multiple falls. EXAM: THORACIC SPINE 2 VIEWS COMPARISON:  Radiographs of December 15, 2017. FINDINGS: There is no evidence of thoracic spine fracture. Alignment is normal. No other significant bone abnormalities are identified. IMPRESSION: Negative. Electronically Signed   By: Lupita Raider, M.D.   On: 03/27/2018 08:38   Dg Lumbar Spine Complete  Result Date: 03/27/2018 CLINICAL DATA:  Low back pain after multiple falls. EXAM: LUMBAR SPINE  - COMPLETE 4+ VIEW COMPARISON:  CT scan of March 24, 2013. FINDINGS: Moderate dextroscoliosis of lumbar spine is noted. Moderate degenerative disc disease is noted at L1-2, L2-3, L3-4 and L4-5 with anterior osteophyte formation. Atherosclerosis of abdominal aorta is noted. No fracture or spondylolisthesis is noted. IMPRESSION: Moderate multilevel degenerative disc disease. No acute abnormality seen in the lumbar spine. Aortic Atherosclerosis (ICD10-I70.0). Electronically Signed   By: Lupita Raider, M.D.   On: 03/27/2018 08:39   Ct Head Wo Contrast  Result Date: 03/27/2018 CLINICAL DATA:  Patient status post fall x2, most recently at 2:30 a.m. last night. Initial encounter. EXAM: CT HEAD WITHOUT CONTRAST CT CERVICAL SPINE WITHOUT CONTRAST TECHNIQUE: Multidetector CT imaging of the head and cervical spine was performed following the standard protocol without intravenous contrast. Multiplanar CT image reconstructions of the cervical spine were also generated. COMPARISON:  Head CT scan 01/24/2013. FINDINGS: CT HEAD FINDINGS Brain: No evidence of acute infarction, hemorrhage, hydrocephalus, extra-axial collection or mass lesion/mass effect. Atrophy and chronic microvascular ischemic change noted. Vascular: No hyperdense vessel or unexpected calcification. Skull: Intact.  No focal lesion. Sinuses/Orbits: Status post cataract surgery. Paranasal sinuses clear. Other: None. CT CERVICAL SPINE FINDINGS Alignment: Maintained. Skull base and vertebrae: No acute fracture. No primary bone lesion or focal pathologic process. Soft tissues and spinal canal: No prevertebral fluid or swelling. No visible canal hematoma. Disc levels: Multilevel loss of disc space height and scattered mild-to-moderate facet arthropathy noted. Upper chest: Biapical scar is seen. Other: None. IMPRESSION: No acute abnormality head or cervical  spine. Atrophy and chronic microvascular ischemic change. Cervical spondylosis. Electronically Signed   By:  Drusilla Kannerhomas  Dalessio M.D.   On: 03/27/2018 09:07   Ct Cervical Spine Wo Contrast  Result Date: 03/27/2018 CLINICAL DATA:  Patient status post fall x2, most recently at 2:30 a.m. last night. Initial encounter. EXAM: CT HEAD WITHOUT CONTRAST CT CERVICAL SPINE WITHOUT CONTRAST TECHNIQUE: Multidetector CT imaging of the head and cervical spine was performed following the standard protocol without intravenous contrast. Multiplanar CT image reconstructions of the cervical spine were also generated. COMPARISON:  Head CT scan 01/24/2013. FINDINGS: CT HEAD FINDINGS Brain: No evidence of acute infarction, hemorrhage, hydrocephalus, extra-axial collection or mass lesion/mass effect. Atrophy and chronic microvascular ischemic change noted. Vascular: No hyperdense vessel or unexpected calcification. Skull: Intact.  No focal lesion. Sinuses/Orbits: Status post cataract surgery. Paranasal sinuses clear. Other: None. CT CERVICAL SPINE FINDINGS Alignment: Maintained. Skull base and vertebrae: No acute fracture. No primary bone lesion or focal pathologic process. Soft tissues and spinal canal: No prevertebral fluid or swelling. No visible canal hematoma. Disc levels: Multilevel loss of disc space height and scattered mild-to-moderate facet arthropathy noted. Upper chest: Biapical scar is seen. Other: None. IMPRESSION: No acute abnormality head or cervical spine. Atrophy and chronic microvascular ischemic change. Cervical spondylosis. Electronically Signed   By: Drusilla Kannerhomas  Dalessio M.D.   On: 03/27/2018 09:07    EKG: not done   Labs on Admission: I have personally reviewed the available labs and imaging studies at the time of the admission.  Pertinent labs:   Glucose 144 GFR 55 Normal CBC UA: 5 ketones, trace LE  Assessment/Plan Principal Problem:   Falls frequently Active Problems:   HTN (hypertension)   Atrial fibrillation (HCC)   Fall frequently -Patient with frequent falls presenting after fall  today -Evaluation has been unremarkable -PT has evaluated the patient and recommends SNF placement -The patient initially declined SNF but appears to now recognize that this is her best option -She does not appear to have any acute medical conditions requiring hospitalization at this time -SW is helping the patient and her family to figure out her best option for placement  Afib -She is not on medication for rate control -Consider addition of low-dose CCB or BB if HR/BP able to tolerate -She is on Xarelto; this is likely a poor choice given her frequent falls  HTN -Continue Cozaar   Code Status: DNR - confirmed with patient/family Family Communication: Nephew present throughout evaluation Disposition Plan: Does not require hospitalization at this time    Jonah BlueJennifer Ciel Yanes MD [facility]   How to contact the [facility] Attending or Consulting provider 7A - 7P or covering provider during after hours 7P -7A, for this patient?  1. Check the care team in [facility] and look for a) attending/consulting [facility] provider listed and b) the [facility] team listed 2. Log into [email or web address] and use [place]'s universal password to access. If you do not have the password, please contact the hospital operator. 3. Locate the [facility] provider you are looking for under [facility] and page to a number that you can be directly reached. 4. If you still have difficulty reaching the provider, please page the [facility] (Director on Call) for the Hospitalists listed on amion for assistance.   03/27/2018, 4:39 PM

## 2018-03-27 NOTE — ED Notes (Signed)
Pt c/o pain in her back

## 2018-03-27 NOTE — ED Provider Notes (Signed)
[facility] EMERGENCY DEPARTMENT Provider Note   CSN: [id number] Arrival date & time: 03/27/18  7829     History   Chief Complaint Chief Complaint  Patient presents with  . Fall    HPI Ann Booth is a 85 y.o. female.  HPI   Patient here for evaluation of fall.  She states she is fell about 2:30 AM and was unable to get up.  She states the fall was precipitated by balance difficulty.  She recalls falling twice about that time, as she was trying to go to the bathroom.  She remembers falling backwards and striking her head.  She complains of pain in her upper back primarily.  She laid on the floor for several hours before she was able to crawl to an area of her house where she had a telephone.  She summoned help by calling 911.  She did not eat this morning or taking the medicine since yesterday.  She has been otherwise well and denies recent illnesses.  There are no other known modifying factors.  Past Medical History:  Diagnosis Date  . ALLERGIC RHINITIS   . ANXIETY   . ASTHMA   . Atrial fibrillation (HCC)   . BACK PAIN   . Carotid stenosis 04/27/2012   Mild left on community screening  - Nov 2013  . CATARACT NOS   . DEPRESSION   . GLAUCOMA NOS   . HTN (hypertension)   . Hypercalcemia   . MITRAL VALVE PROLAPSE   . OSTEOPOROSIS   . PERIPHERAL NEUROPATHY   . POSITIONAL VERTIGO   . Rosacea     Patient Active Problem List   Diagnosis Date Noted  . Other fatigue 03/10/2018  . Degenerative arthritis of left knee 06/25/2017  . Peripheral vascular disease (HCC) 05/21/2017  . Rib pain 04/24/2017  . Chest pain 03/16/2017  . Callus of foot 05/26/2016  . Right foot pain 03/21/2016  . Allergic conjunctivitis 03/21/2016  . Insomnia 07/28/2013  . Arthritis of sacroiliac joint 03/30/2013  . Leg length discrepancy 03/30/2013  . N&V (nausea and vomiting) 01/27/2013  . Atrial fibrillation (HCC) 11/01/2012  . Chronic pain of left knee 10/08/2012  .  Conjunctivitis 07/21/2012  . Carotid stenosis 04/27/2012  . Chronic lower back pain 04/27/2012  . Idiopathic scoliosis 04/27/2012  . Black stools 10/25/2011  . HTN (hypertension) 08/23/2010  . Encounter for well adult exam with abnormal findings 07/25/2010  . Peripheral edema 07/25/2010  . Hypercalcemia 11/09/2008  . POSITIONAL VERTIGO 11/09/2008  . BACK PAIN 11/09/2008  . Peripheral neuropathic pain 06/01/2007  . Allergic rhinitis 06/01/2007  . SHOULDER PAIN, BILATERAL 06/01/2007  . FATIGUE 06/01/2007  . Hyperlipidemia 10/18/2006  . Anxiety state 10/18/2006  . Depression 10/18/2006  . GLAUCOMA NOS 10/15/2006  . CATARACT NOS 10/15/2006  . Mitral valve disorder 10/15/2006  . Asthma 10/15/2006  . Rosacea 10/15/2006  . Osteoporosis 10/15/2006    Past Surgical History:  Procedure Laterality Date  .  ankle surgury     left ankle  . CATARACT EXTRACTION, BILATERAL    . HEMORRHOID SURGERY    . posterior chamber interocular lens implant       OB History   No obstetric history on file.      Home Medications    Prior to Admission medications   Medication Sig Start Date End Date Taking? Authorizing Provider  ALPRAZolam Prudy Feeler) 0.5 MG tablet 1/2 - 1 tab by mouth at bedtime as needed Patient taking differently: Take  0.25-0.5 mg by mouth daily as needed for anxiety or sleep.  03/16/17  Yes Corwin Levins, MD  b complex vitamins tablet Take 1 tablet by mouth daily.   Yes [provider]  Biotin 1 MG CAPS Take 1 tablet by mouth daily.   Yes [provider]  Calcium Carbonate-Vitamin D (CALCIUM + D PO) Take 1 tablet by mouth daily.    Yes [provider]  Coenzyme Q10 (COQ-10) 100 MG CAPS Take 100 mg by mouth daily.    Yes [provider]  diclofenac sodium (VOLTAREN) 1 % GEL Apply 4 g topically 4 (four) times daily as needed. 03/16/17  Yes Corwin Levins, MD  furosemide (LASIX) 20 MG tablet Take 1 tablet (20 mg total) by mouth daily. 12/22/17  Yes  Lewayne Bunting, MD  glucosamine-chondroitin 500-400 MG tablet Take 1 tablet by mouth 2 (two) times daily.   Yes [provider]  IRON, FERROUS GLUCONATE, PO Take 1 tablet by mouth every Monday, Wednesday, and Friday.    Yes [provider]  loratadine (CLARITIN) 10 MG tablet Take 1 tablet (10 mg total) by mouth daily. 01/13/18  Yes Olive Bass, FNP  losartan (COZAAR) 100 MG tablet TAKE 1 TABLET BY MOUTH  DAILY 04/20/17  Yes Corwin Levins, MD  LUTEIN PO Take 1 tablet by mouth daily.   Yes [provider]  Magnesium Citrate 100 MG TABS Take 100 mg by mouth daily.    Yes [provider]  Multiple Vitamins-Minerals (MACULAR VITAMIN BENEFIT PO) Take 2 tablets by mouth daily.   Yes [provider]  Multiple Vitamins-Minerals (MULTIVITAMIN & MINERAL PO) Take 1 tablet by mouth daily.   Yes [provider]  potassium chloride (K-DUR) 10 MEQ tablet Take 1 tablet (10 mEq total) by mouth daily. 01/13/18  Yes Olive Bass, FNP  Rivaroxaban (XARELTO) 15 MG TABS tablet TAKE 1 TABLET BY MOUTH ONCE DAILY WITH SUPPER 12/28/17  Yes Lewayne Bunting, MD  Thiamine HCl (B-1 PO) Take 1 tablet by mouth daily.    Yes [provider]  timolol (BETIMOL) 0.5 % ophthalmic solution Place 1 drop into both eyes 2 (two) times daily.    Yes [provider]  vitamin B-12 (CYANOCOBALAMIN) 1000 MCG tablet Take 1,000 mcg by mouth every other day.    Yes [provider]  acetaminophen-codeine (TYLENOL #3) 300-30 MG tablet Take 1 tablet by mouth every 8 (eight) hours as needed for moderate pain. Patient not taking: Reported on 03/27/2018 10/05/17   Corwin Levins, MD  lidocaine (LIDODERM) 5 % Place 1 patch onto the skin daily. Remove & Discard patch within 12 hours or as directed by MD Patient not taking: Reported on 03/27/2018 10/05/17   Corwin Levins, MD  pantoprazole (PROTONIX) 20 MG tablet Take 1 tablet (20 mg total) by mouth  daily. Patient not taking: Reported on 03/27/2018 12/08/17   Olive Bass, FNP    Family History Family History  Problem Relation Age of Onset  . CAD Mother        MI at age 30  . Cancer Sister        colon  . Cancer Brother        prostate cancer    Social History Social History   Tobacco Use  . Smoking status: Never Smoker  . Smokeless tobacco: Never Used  Substance Use Topics  . Alcohol use: No  . Drug use: No  Allergies   Cymbalta [duloxetine hcl]   Review of Systems Review of Systems  All other systems reviewed and are negative.    Physical Exam Updated Vital Signs BP (!) 147/74 (BP Location: Right Arm)   Pulse 96   Temp 97.8 F (36.6 C) (Oral)   Resp 15   Ht 5\' 2"  (1.575 m)   Wt 58.5 kg   SpO2 99%   BMI 23.59 kg/m   Physical Exam Vitals signs and nursing note reviewed.  Constitutional:      General: She is not in acute distress.    Appearance: Normal appearance. She is well-developed. She is not ill-appearing, toxic-appearing or diaphoretic.     Comments: Elderly, frail  HENT:     Head: Normocephalic.     Comments: Contusion with laceration left parietal.  Laceration gaping and bleeding.  No associated crepitation.    Right Ear: External ear normal.     Left Ear: External ear normal.     Nose: No congestion or rhinorrhea.     Mouth/Throat:     Mouth: Mucous membranes are moist.  Eyes:     General:        Right eye: No discharge.        Left eye: No discharge.     Conjunctiva/sclera: Conjunctivae normal.     Pupils: Pupils are equal, round, and reactive to light.  Neck:     Musculoskeletal: Normal range of motion and neck supple.     Trachea: Phonation normal.  Cardiovascular:     Rate and Rhythm: Normal rate and regular rhythm.     Heart sounds: Normal heart sounds.  Pulmonary:     Effort: Pulmonary effort is normal.     Breath sounds: Normal breath sounds.     Comments: No chest wall tenderness or crepitation. Chest:      Chest wall: No tenderness.  Abdominal:     General: There is no distension.     Palpations: Abdomen is soft.     Tenderness: There is no abdominal tenderness. There is no rebound.     Hernia: No hernia is present.  Musculoskeletal:     Comments: Normal active and passive range of motion arms and legs bilaterally.  Mild tenderness upper thoracic spine, to palpation.  No associated step-off of the cervical, thoracic or lumbar spines.  Skin:    General: Skin is warm and dry.  Neurological:     Mental Status: She is alert and oriented to person, place, and time.     Cranial Nerves: No cranial nerve deficit.     Sensory: No sensory deficit.     Motor: No abnormal muscle tone.     Coordination: Coordination normal.  Psychiatric:        Behavior: Behavior normal.        Thought Content: Thought content normal.        Judgment: Judgment normal.      ED Treatments / Results  Labs (all labs ordered are listed, but only abnormal results are displayed) Labs Reviewed  URINALYSIS, ROUTINE W REFLEX MICROSCOPIC - Abnormal; Notable for the following components:      Result Value   APPearance HAZY (*)    Ketones, ur 5 (*)    Leukocytes, UA TRACE (*)    All other components within normal limits  BASIC METABOLIC PANEL - Abnormal; Notable for the following components:   Glucose, Bld 144 (*)    GFR calc non Af Amer 55 (*)  All other components within normal limits  CBC WITH DIFFERENTIAL/PLATELET    EKG None  Radiology Dg Thoracic Spine 2 View  Result Date: 03/27/2018 CLINICAL DATA:  Back pain after multiple falls. EXAM: THORACIC SPINE 2 VIEWS COMPARISON:  Radiographs of December 15, 2017. FINDINGS: There is no evidence of thoracic spine fracture. Alignment is normal. No other significant bone abnormalities are identified. IMPRESSION: Negative. Electronically Signed   By: Lupita Raider, M.D.   On: 03/27/2018 08:38   Dg Lumbar Spine Complete  Result Date: 03/27/2018 CLINICAL DATA:  Low  back pain after multiple falls. EXAM: LUMBAR SPINE - COMPLETE 4+ VIEW COMPARISON:  CT scan of March 24, 2013. FINDINGS: Moderate dextroscoliosis of lumbar spine is noted. Moderate degenerative disc disease is noted at L1-2, L2-3, L3-4 and L4-5 with anterior osteophyte formation. Atherosclerosis of abdominal aorta is noted. No fracture or spondylolisthesis is noted. IMPRESSION: Moderate multilevel degenerative disc disease. No acute abnormality seen in the lumbar spine. Aortic Atherosclerosis (ICD10-I70.0). Electronically Signed   By: Lupita Raider, M.D.   On: 03/27/2018 08:39   Ct Head Wo Contrast  Result Date: 03/27/2018 CLINICAL DATA:  Patient status post fall x2, most recently at 2:30 a.m. last night. Initial encounter. EXAM: CT HEAD WITHOUT CONTRAST CT CERVICAL SPINE WITHOUT CONTRAST TECHNIQUE: Multidetector CT imaging of the head and cervical spine was performed following the standard protocol without intravenous contrast. Multiplanar CT image reconstructions of the cervical spine were also generated. COMPARISON:  Head CT scan 01/24/2013. FINDINGS: CT HEAD FINDINGS Brain: No evidence of acute infarction, hemorrhage, hydrocephalus, extra-axial collection or mass lesion/mass effect. Atrophy and chronic microvascular ischemic change noted. Vascular: No hyperdense vessel or unexpected calcification. Skull: Intact.  No focal lesion. Sinuses/Orbits: Status post cataract surgery. Paranasal sinuses clear. Other: None. CT CERVICAL SPINE FINDINGS Alignment: Maintained. Skull base and vertebrae: No acute fracture. No primary bone lesion or focal pathologic process. Soft tissues and spinal canal: No prevertebral fluid or swelling. No visible canal hematoma. Disc levels: Multilevel loss of disc space height and scattered mild-to-moderate facet arthropathy noted. Upper chest: Biapical scar is seen. Other: None. IMPRESSION: No acute abnormality head or cervical spine. Atrophy and chronic microvascular ischemic change.  Cervical spondylosis. Electronically Signed   By: Drusilla Kanner M.D.   On: 03/27/2018 09:07   Ct Cervical Spine Wo Contrast  Result Date: 03/27/2018 CLINICAL DATA:  Patient status post fall x2, most recently at 2:30 a.m. last night. Initial encounter. EXAM: CT HEAD WITHOUT CONTRAST CT CERVICAL SPINE WITHOUT CONTRAST TECHNIQUE: Multidetector CT imaging of the head and cervical spine was performed following the standard protocol without intravenous contrast. Multiplanar CT image reconstructions of the cervical spine were also generated. COMPARISON:  Head CT scan 01/24/2013. FINDINGS: CT HEAD FINDINGS Brain: No evidence of acute infarction, hemorrhage, hydrocephalus, extra-axial collection or mass lesion/mass effect. Atrophy and chronic microvascular ischemic change noted. Vascular: No hyperdense vessel or unexpected calcification. Skull: Intact.  No focal lesion. Sinuses/Orbits: Status post cataract surgery. Paranasal sinuses clear. Other: None. CT CERVICAL SPINE FINDINGS Alignment: Maintained. Skull base and vertebrae: No acute fracture. No primary bone lesion or focal pathologic process. Soft tissues and spinal canal: No prevertebral fluid or swelling. No visible canal hematoma. Disc levels: Multilevel loss of disc space height and scattered mild-to-moderate facet arthropathy noted. Upper chest: Biapical scar is seen. Other: None. IMPRESSION: No acute abnormality head or cervical spine. Atrophy and chronic microvascular ischemic change. Cervical spondylosis. Electronically Signed   By: Drusilla Kanner M.D.  On: 03/27/2018 09:07    Procedures .Marland KitchenLaceration Repair Date/Time: 03/27/2018 9:45 AM Performed by: Mancel Bale, MD Authorized by: Mancel Bale, MD   Consent:    Consent obtained:  Verbal   Consent given by:  Patient   Risks discussed:  Pain and need for additional repair   Alternatives discussed:  No treatment Anesthesia (see MAR for exact dosages):    Anesthesia method:   None Laceration details:    Location:  Scalp   Scalp location:  L parietal   Length (cm):  3   Depth (mm):  10 Repair type:    Repair type:  Simple Pre-procedure details:    Preparation:  Patient was prepped and draped in usual sterile fashion Exploration:    Hemostasis achieved with:  Direct pressure   Wound exploration: wound explored through full range of motion     Wound extent: no fascia violation noted, no foreign bodies/material noted and no muscle damage noted   Treatment:    Area cleansed with:  Betadine   Amount of cleaning:  Standard   Visualized foreign bodies/material removed: no   Skin repair:    Repair method:  Staples   Number of staples:  3 Approximation:    Approximation:  Loose Post-procedure details:    Dressing:  Antibiotic ointment and non-adherent dressing   Patient tolerance of procedure:  Tolerated well, no immediate complications .Critical Care Performed by: Mancel Bale, MD Authorized by: Mancel Bale, MD   Critical care provider statement:    Critical care time (minutes):  35   Critical care start time:  03/27/2018 7:45 AM   Critical care end time:  03/27/2018 3:03 PM   Critical care time was exclusive of:  Separately billable procedures and treating other patients   Critical care was necessary to treat or prevent imminent or life-threatening deterioration of the following conditions:  Trauma   Critical care was time spent personally by me on the following activities:  Blood draw for specimens, development of treatment plan with patient or surrogate, discussions with consultants, evaluation of patient's response to treatment, examination of patient, obtaining history from patient or surrogate, ordering and performing treatments and interventions, ordering and review of laboratory studies, pulse oximetry, re-evaluation of patient's condition, review of old charts and ordering and review of radiographic studies   (including critical care  time)  Medications Ordered in ED Medications  sodium chloride 0.9 % bolus 500 mL (0 mLs Intravenous Stopped 03/27/18 1159)  Tdap (BOOSTRIX) injection 0.5 mL (0.5 mLs Intramuscular Given 03/27/18 0913)     Initial Impression / Assessment and Plan / ED Course  I have reviewed the triage vital signs and the nursing notes.  Pertinent labs & imaging results that were available during my care of the patient were reviewed by me and considered in my medical decision making (see chart for details).  Clinical Course as of Mar 28 1527  Sat Mar 27, 2018  6950 Initial evaluation consistent with most likely mechanical fall however patient at risk for multiple acute problems.  She will be evaluated comprehensively with imaging, blood work and urinalysis.  Will give fluid bolus and reassess.   [EW]  0915 Normal  Urinalysis, Routine w reflex microscopic(!) [EW]  0915 Normal except glucose high, GFR low  Basic metabolic panel(!) [EW]  0915 Normal  CBC with Differential [EW]  0915 CT Head Wo Contrast [EW]  0916 Normal, images reviewed by me  CT Head Wo Contrast [EW]  0916 No acute abnormalities, DJD is  present, images reviewed by me  CT Cervical Spine Wo Contrast [EW]  0917 DJD without fracture, images reviewed by me  DG Lumbar Spine Complete [EW]  0917 DJD without fracture, images reviewed by me  DG Thoracic Spine 2 View [EW]  1050 Ambulation trial, completed.  Patient was unable to get up on her own and required a walker to ambulate, which is a higher level of support than she is currently using.  Patient will be evaluated by physical therapy to see if there is a reason for placement, versus management in the outpatient setting.   [EW]  1259 Patient seen by physical therapy for evaluation.  Patient meets criteria for admission to a skilled nursing facility for rehabilitation.   [EW]  1259 Social work and case management consultation for help with placement.   [EW]  1503 Patient's sister and  brother-in-law are here now and states that they cannot support the patient at home, to assist her until home health can get there.  Therefore, will request that the patient be hospitalized for observation, and placement versus discharge as she progresses   [EW]    Clinical Course User Index [EW] Mancel Bale, MD    Patient Vitals for the past 24 hrs:  BP Temp Temp src Pulse Resp SpO2 Height Weight  03/27/18 1258 (!) 147/74 - - 96 15 99 % - -  03/27/18 1230 (!) 146/83 - - - - - - -  03/27/18 1200 134/79 - - (!) 104 - 99 % - -  03/27/18 1158 (!) 152/83 - - (!) 101 14 97 % - -  03/27/18 1157 (!) 152/83 - - 89 18 100 % - -  03/27/18 1055 (!) 126/91 - - (!) 106 16 97 % - -  03/27/18 0945 (!) 150/91 - - (!) 104 - 98 % - -  03/27/18 0736 (!) 170/106 97.8 F (36.6 C) Oral 95 16 97 % - -  03/27/18 0735 - - - - - - 5\' 2"  (1.575 m) 58.5 kg    9:46 AM Reevaluation with update and discussion. After initial assessment and treatment, an updated evaluation reveals remains alert and cooperative, she has no further complaints.  Findings discussed and questions answered. Mancel Bale   Medical Decision Making: Fall, likely mechanical.  Screening evaluation for acute medical problems is negative.  No evidence for serious intracranial injury, spinal injury or fracture.  CRITICAL CARE- yes Performed by: Mancel Bale    Nursing Notes Reviewed/ Care Coordinated Applicable Imaging Reviewed Interpretation of Laboratory Data incorporated into ED treatment   3:04 PM-Consult complete with hospitalist. Patient case explained and discussed.  She agrees to evaluate the patient, with consultation for further evaluation and treatment. Call ended at 1605 p.m.  Plan: Hospitalist consultation, hold in emergency department, until patient can be placed   Final Clinical Impressions(s) / ED Diagnoses   Final diagnoses:  Fall, initial encounter  Laceration of occipital region of scalp, initial encounter   Injury of back, initial encounter    ED Discharge Orders    None       Mancel Bale, MD 03/27/18 1621

## 2018-03-28 NOTE — ED Notes (Signed)
Ann Booth with Case management at bedside with family.

## 2018-03-28 NOTE — ED Notes (Signed)
Family at bedside. 

## 2018-03-28 NOTE — ED Notes (Signed)
Pt ambulatory to restroom with walker 

## 2018-03-28 NOTE — ED Notes (Signed)
Breakfast tray ordered 

## 2018-03-28 NOTE — ED Notes (Signed)
Pt and family aware and voiced understanding and agreement w/tx plan - d/c to home. Home Health being arranged by CM. Pt eating lunch.

## 2018-03-28 NOTE — Discharge Instructions (Addendum)
Please read and follow all provided instructions.  Your diagnoses today include:  1. Fall, initial encounter   2. Laceration of occipital region of scalp, initial encounter   3. Injury of back, initial encounter     Tests performed today include: Vital signs. See below for your results today.   Medications prescribed:   Take any prescribed medications only as directed.   PLEASE STOP TAKING XARELTO AND CALL YOUR PRIMARY DOCTOR THIS WEEK. WE NEED TO DETERMINE IF THIS IS THE RIGHT MEDICATION FOR YOU.   Home care instructions:  Follow any educational materials and wound care instructions contained in this packet.   Keep affected area above the level of your heart when possible to minimize swelling. Wash area gently twice a day with warm soapy water. Do not apply alcohol or hydrogen peroxide. Cover the area if it draining or weeping.   Follow-up instructions: Suture Removal: Return to the Emergency Department or see your primary care care doctor in 7 days for a recheck of your wound and removal of your sutures or staples.    Return instructions:  Return to the Emergency Department if you have: Any further falls with head injury, dizziness, lightheadedness, passing out, chest pain or shortness of breath. Fever Worsening pain Worsening swelling of the wound Pus draining from the wound Redness of the skin that moves away from the wound, especially if it streaks away from the affected area  Any other emergent concerns  Your vital signs today were: BP 138/84 (BP Location: Left Arm)    Pulse 64    Temp 98 F (36.7 C) (Oral)    Resp 18    Ht 5\' 2"  (1.575 m)    Wt 58.5 kg    SpO2 98%    BMI 23.59 kg/m  If your blood pressure (BP) was elevated above 135/85 this visit, please have this repeated by your doctor within one month. --------------

## 2018-03-28 NOTE — ED Notes (Addendum)
Pt received lunch tray 

## 2018-03-28 NOTE — ED Notes (Signed)
Pt received breakfast tray. Pt continues sleeping. 

## 2018-03-28 NOTE — ED Notes (Signed)
Pt ambulated too restroom and back to room with walker. Pt has slow, even and steady gait.

## 2018-03-28 NOTE — ED Notes (Signed)
Aviva Kluver PA at bedside speaking with pt and family.

## 2018-03-28 NOTE — ED Provider Notes (Signed)
Patient was evaluated at bedside.  Patient is sitting up, comfortable, and eating.  She is conversing.  She appears fully alert and with full capacity make medical decisions.  No confusion at this time.  Case was discussed and chart was reviewed with Dr. Margarita Grizzle.  Per recommendations of hospital medicine consultation last night, will stop Xarelto until patient is able to follow-up with her primary care provider to discuss further anticoagulation.  At this time based on HAS-BLED, patient has moderate risk for bleeding.  Given the frequent falls, hospital medicine recommended stopping this and considering alternative.  I did discuss the risks and benefits of with this but the patient and instructed to hold Xarelto until she is able to follow-up with her primary care provider later this week.  She is amenable to this plan.  Patient was very adamant that she does not want any residential SNF or rehab.  She is amenable to home health.  She has support at bedside, niece and sister with whom I discussed the plan and they are comfortable.  Patient has walker at home.  Home health orders are placed.  Patient and family are in understanding and agree with the plan of care.  This is a supervised visit with Dr. Margarita Grizzle. Evaluation, management, and discharge planning discussed with this attending physician.    Elisha Ponder, PA-C 03/28/18 1258    Margarita Grizzle, MD 03/28/18 [phone]

## 2018-03-28 NOTE — ED Provider Notes (Signed)
Chart reviewed during morning rounds of psych/purple pod. Pt boarding pending home health vs SNF placement, after a fall at home and requiring increased assistance with ambulation. Pt meeting criteria for admission to skilled nursing for rehabilitation. Per nursing, no overnight events. Labs and VS reviewed. Pt sleeping on evaluation.   Wells Gerdeman, [place] N, PA-C 03/28/18 7169    Alvira Monday, MD 03/29/18 [phone]

## 2018-03-29 ENCOUNTER — Ambulatory Visit: Payer: PPO | Admitting: Cardiology

## 2018-03-29 ENCOUNTER — Encounter: Payer: Self-pay | Admitting: Cardiology

## 2018-03-29 VITALS — BP 108/62 | HR 86 | Ht 63.0 in | Wt 126.0 lb

## 2018-03-29 DIAGNOSIS — I480 Paroxysmal atrial fibrillation: Secondary | ICD-10-CM | POA: Diagnosis not present

## 2018-03-29 DIAGNOSIS — I059 Rheumatic mitral valve disease, unspecified: Secondary | ICD-10-CM | POA: Diagnosis not present

## 2018-03-29 DIAGNOSIS — I10 Essential (primary) hypertension: Secondary | ICD-10-CM

## 2018-03-29 MED ORDER — LOSARTAN POTASSIUM 50 MG PO TABS: 50.0000 mg | ORAL_TABLET | Freq: Every day | ORAL | 3 refills | Status: DC

## 2018-03-29 MED ORDER — FUROSEMIDE 20 MG PO TABS: ORAL_TABLET | ORAL | 3 refills | Status: DC

## 2018-03-29 NOTE — Patient Instructions (Signed)
Medication Instructions:  DECREASE LOSARTAN TO 50 MG ONCE DAILY=1/2 OF THE 100 MG TABLET ONCE DAILY  START FUROSEMIDE 20 MG ONCE DAILY AS NEEDED FOR SWELLING If you need a refill on your cardiac medications before your next appointment, please call your pharmacy.   Lab work: If you have labs (blood work) drawn today and your tests are completely normal, you will receive your results only by: Marland Kitchen MyChart Message (if you have MyChart) OR . A paper copy in the mail If you have any lab test that is abnormal or we need to change your treatment, we will call you to review the results.  Follow-Up: At [facility], you and your health needs are our priority.  As part of our continuing mission to provide you with exceptional heart care, we have created designated Provider Care Teams.  These Care Teams include your primary Cardiologist (physician) and Advanced Practice Providers (APPs -  Physician Assistants and Nurse Practitioners) who all work together to provide you with the care you need, when you need it. Your physician recommends that you schedule a follow-up appointment in: 3 MONTHS WITH DR Jens Som

## 2018-03-30 ENCOUNTER — Telehealth: Payer: Self-pay | Admitting: Cardiology

## 2018-03-30 ENCOUNTER — Ambulatory Visit: Payer: PPO | Admitting: Internal Medicine

## 2018-03-30 NOTE — Telephone Encounter (Signed)
Spoke with pt, aware she can use tylenol but if she needs something stronger she would need to call her medical doctor.

## 2018-03-30 NOTE — Telephone Encounter (Signed)
New Message          Patient is calling today because she is still in pain and would like something called. Someone mentioned "Tylenol 10 mg" to the patient and she was asking if that was good dosage, if not she would like something stronger called in at [facility] @ "[address]

## 2018-03-31 ENCOUNTER — Ambulatory Visit: Payer: PPO | Admitting: Internal Medicine

## 2018-03-31 DIAGNOSIS — F419 Anxiety disorder, unspecified: Secondary | ICD-10-CM | POA: Diagnosis not present

## 2018-03-31 DIAGNOSIS — Z7901 Long term (current) use of anticoagulants: Secondary | ICD-10-CM | POA: Diagnosis not present

## 2018-03-31 DIAGNOSIS — M47812 Spondylosis without myelopathy or radiculopathy, cervical region: Secondary | ICD-10-CM | POA: Diagnosis not present

## 2018-03-31 DIAGNOSIS — R296 Repeated falls: Secondary | ICD-10-CM | POA: Diagnosis not present

## 2018-03-31 DIAGNOSIS — I341 Nonrheumatic mitral (valve) prolapse: Secondary | ICD-10-CM | POA: Diagnosis not present

## 2018-03-31 DIAGNOSIS — G629 Polyneuropathy, unspecified: Secondary | ICD-10-CM | POA: Diagnosis not present

## 2018-03-31 DIAGNOSIS — M2578 Osteophyte, vertebrae: Secondary | ICD-10-CM | POA: Diagnosis not present

## 2018-03-31 DIAGNOSIS — M1712 Unilateral primary osteoarthritis, left knee: Secondary | ICD-10-CM | POA: Diagnosis not present

## 2018-03-31 DIAGNOSIS — M4186 Other forms of scoliosis, lumbar region: Secondary | ICD-10-CM | POA: Diagnosis not present

## 2018-03-31 DIAGNOSIS — I6522 Occlusion and stenosis of left carotid artery: Secondary | ICD-10-CM | POA: Diagnosis not present

## 2018-03-31 DIAGNOSIS — M810 Age-related osteoporosis without current pathological fracture: Secondary | ICD-10-CM | POA: Diagnosis not present

## 2018-03-31 DIAGNOSIS — M5136 Other intervertebral disc degeneration, lumbar region: Secondary | ICD-10-CM | POA: Diagnosis not present

## 2018-03-31 DIAGNOSIS — I700 Atherosclerosis of aorta: Secondary | ICD-10-CM | POA: Diagnosis not present

## 2018-03-31 DIAGNOSIS — H409 Unspecified glaucoma: Secondary | ICD-10-CM | POA: Diagnosis not present

## 2018-03-31 DIAGNOSIS — J45909 Unspecified asthma, uncomplicated: Secondary | ICD-10-CM | POA: Diagnosis not present

## 2018-03-31 DIAGNOSIS — I10 Essential (primary) hypertension: Secondary | ICD-10-CM | POA: Diagnosis not present

## 2018-03-31 DIAGNOSIS — F329 Major depressive disorder, single episode, unspecified: Secondary | ICD-10-CM | POA: Diagnosis not present

## 2018-03-31 DIAGNOSIS — I4891 Unspecified atrial fibrillation: Secondary | ICD-10-CM | POA: Diagnosis not present

## 2018-04-01 ENCOUNTER — Other Ambulatory Visit: Payer: Self-pay

## 2018-04-01 ENCOUNTER — Telehealth: Payer: Self-pay | Admitting: Internal Medicine

## 2018-04-01 NOTE — Telephone Encounter (Signed)
Copied from CRM [id number]. Topic: Quick Communication - Home Health Verbal Orders >> Apr 01, 2018  3:20 PM Jaquita Rector A wrote: Caller/Agency: Baird Lyons / [facility]  Callback Number: [phone] ok to LM Requesting OT/PT/Skilled Nursing/Social Work: Skilled nursing Frequency: 1x wk for 3 wks

## 2018-04-01 NOTE — Telephone Encounter (Signed)
Verbal orders given  

## 2018-04-01 NOTE — Patient Outreach (Signed)
[facility]) Care Management  04/01/2018  Ann Booth 1926/07/25 [id number]  TELEPHONE SCREENING Referral date: 03/31/18 Referral source: utilization management Referral reason: falls/ emergency room follow up  Insurance: Health team advantage.   Telephone call to patient regarding referral. Unable to reach patient. HIPAA compliant voice message left with call back phone number.  Telephone call to patient sister/ designated party release, Ann Booth.  HIPAA verified by sister for patient. Sister states patient was in the emergency room on  01/26/19 due to a fall.  Sister states patient fell backwards and hit her head and hurt her back. She states she received sutures to her head.  Sister states patient has a follow up visit with her primary MD on 04/05/18 to get her stitches removed and have an xray of her back.  She states a home health agency nurse visited patient on yesterday and will call patient back when services are to start.  Sister states its best to reach patient by phone in the afternoon because patient sleeps late.    PLAN: RNCM will attempt 2nd telephone outreach call to patient within 4 business days . RNCM will send outreach letter to patient.  George Ina RN,BSN,CCM [facility] Telephonic  [phone]

## 2018-04-01 NOTE — Telephone Encounter (Signed)
Copied from CRM [id number]. Topic: Quick Communication - Home Health Verbal Orders >> Apr 01, 2018  2:42 PM Lorayne Bender wrote: Caller/Agency: Trey Paula with [facility] Callback Number: [phone], OK to leave a message Requesting OT/PT/Skilled Nursing/Social Work: PT Frequency: 2 week 1, 1 week 2

## 2018-04-02 ENCOUNTER — Other Ambulatory Visit: Payer: Self-pay

## 2018-04-02 NOTE — Patient Outreach (Signed)
[facility]) Care Management  04/02/2018  Ann Booth [birth year] [id number]  TELEPHONE SCREENING Referral date: 03/31/18 Referral source: utilization management Referral reason: falls/ emergency room follow up  Insurance: Health team advantage  Telephone call to patient regarding utilization management referral. HIPAA verified with patient. Explained reason for call.  Patient states a person from [facility] came out to see her. She states she will be receiving home physical therapy for approximately 5 weeks.  Patient states she has a follow up with her primary MD on 04/05/18.  Patient states she has transportation assistance to her appointments.  Patient reports having her medications and taking them as prescribed.  Patient states the therapist said her walker was a little old and she would probably need a new one.  RNCM advised patient that home health physical therapist will be able to request order for new walker. Patient states, " I think they did already."  Patient denies any further needs or concerns.  RNCM advised patient to notify MD of any changes in condition prior to scheduled appointment. RNCM provided contact name and number: [phone] or main office number [phone] and 24 hour nurse advise line [phone] by mail.  RNCM verified patient aware of 911 services for urgent/ emergent needs.  PLAN:  RNCM will close patient due to being assessed and having no further needs.  RNCM will send patient [facility] care management brochure/ magnet as discussed  RNCM will send closure letter to patients primary MD   George Ina RN,BSN,CCM [facility] Telephonic  [phone]

## 2018-04-05 ENCOUNTER — Ambulatory Visit (INDEPENDENT_AMBULATORY_CARE_PROVIDER_SITE_OTHER): Payer: PPO | Admitting: Internal Medicine

## 2018-04-05 ENCOUNTER — Encounter: Payer: Self-pay | Admitting: Internal Medicine

## 2018-04-05 VITALS — BP 118/82 | HR 64 | Temp 97.8°F | Ht 63.0 in | Wt 127.0 lb

## 2018-04-05 DIAGNOSIS — Z0000 Encounter for general adult medical examination without abnormal findings: Secondary | ICD-10-CM | POA: Diagnosis not present

## 2018-04-05 DIAGNOSIS — M546 Pain in thoracic spine: Secondary | ICD-10-CM | POA: Diagnosis not present

## 2018-04-05 DIAGNOSIS — R739 Hyperglycemia, unspecified: Secondary | ICD-10-CM | POA: Insufficient documentation

## 2018-04-05 LAB — POCT GLYCOSYLATED HEMOGLOBIN (HGB A1C): Hemoglobin A1C: 5.2 % (ref 4.0–5.6)

## 2018-04-05 MED ORDER — TRAMADOL HCL 50 MG PO TABS: 50.0000 mg | ORAL_TABLET | Freq: Four times a day (QID) | ORAL | 0 refills | Status: DC | PRN

## 2018-04-05 NOTE — Progress Notes (Signed)
Subjective:    Patient ID: Ann Booth, female    DOB: May 23, 1926, 85 y.o.   MRN: [id number]  HPI  Here for wellness and f/u;  Overall doing ok;  Pt denies Chest pain, worsening SOB, DOE, wheezing, orthopnea, PND, worsening LE edema, palpitations, dizziness or syncope.  Pt denies neurological change such as new headache, facial or extremity weakness.  Pt denies polydipsia, polyuria, or low sugar symptoms. Pt states overall good compliance with treatment and medications, good tolerability, and has been trying to follow appropriate diet.  Pt denies worsening depressive symptoms, suicidal ideation or panic. No fever, night sweats, wt loss, loss of appetite, or other constitutional symptoms.  Pt states fair ability with ADL's, has low fall risk, home safety reviewed and adequate, no other significant changes in hearing or vision, and only occasionally active with exercise. Needs staples out from post head laceration from fall.   Saw Dr Crenshaw/cards who is concerned about her anticaogualtion and recent fall.  Has hx of afib, so if falls again might consider taking off med.  Getting HH with PT (twice weekly), needs replacement walker since the old one is no longer a good fit.  Has intermittent sob, so taking the lasix prn symptoms or worsening LE edema. Pt continues to have recurring LBP without change in severity, bowel or bladder change, fever, wt loss,  worsening LE pain/numbness/weakness, gait change or falls, but this pain is to mid upper thoracic area pain persistently, mild to mod, family wants to know if needs MRI. Past Medical History:  Diagnosis Date  . ALLERGIC RHINITIS   . ANXIETY   . ASTHMA   . Atrial fibrillation (HCC)   . BACK PAIN   . Carotid stenosis 04/27/2012   Mild left on community screening  - Nov 2013  . CATARACT NOS   . DEPRESSION   . GLAUCOMA NOS   . HTN (hypertension)   . Hypercalcemia   . MITRAL VALVE PROLAPSE   . OSTEOPOROSIS   . PERIPHERAL NEUROPATHY   .  POSITIONAL VERTIGO   . Rosacea    Past Surgical History:  Procedure Laterality Date  .  ankle surgury     left ankle  . CATARACT EXTRACTION, BILATERAL    . HEMORRHOID SURGERY    . posterior chamber interocular lens implant      reports that she has never smoked. She has never used smokeless tobacco. She reports that she does not drink alcohol or use drugs. family history includes CAD in her mother; Cancer in her brother and sister. Allergies  Allergen Reactions  . Cymbalta [Duloxetine Hcl] Other (See Comments)   Review of Systems  Constitutional: Negative for other unusual diaphoresis or sweats HENT: Negative for ear discharge or swelling Eyes: Negative for other worsening visual disturbances Respiratory: Negative for stridor or other swelling  Gastrointestinal: Negative for worsening distension or other blood Genitourinary: Negative for retention or other urinary change Musculoskeletal: Negative for other MSK pain or swelling Skin: Negative for color change or other new lesions Neurological: Negative for worsening tremors and other numbness  Psychiatric/Behavioral: Negative for worsening agitation or other fatigue All other system neg per pt    Objective:   Physical Exam BP 118/82   Pulse 64   Temp 97.8 F (36.6 C) (Oral)   Ht 5\' 3"  (1.6 m)   Wt 127 lb (57.6 kg)   SpO2 92%   BMI 22.50 kg/m  VS noted,  Constitutional: Pt appears in NAD HENT: Head: NCAT.  Right Ear: External ear normal.  Left Ear: External ear normal.  Eyes: . Pupils are equal, round, and reactive to light. Conjunctivae and EOM are normal Nose: without d/c or deformity Neck: Neck supple. Gross normal ROM Spine with mild upper midline thoracic tenderness about t4 Cardiovascular: Normal rate and regular rhythm.   Pulmonary/Chest: Effort normal and breath sounds without rales or wheezing.  Abd:  Soft, NT, ND, + BS, no organomegaly Neurological: Pt is alert. At baseline orientation, motor grossly  intact Skin: Skin is warm. No rashes, other new lesions, no LE edema Psychiatric: Pt behavior is normal without agitation  No other exam findings  POCT - A1c today - POCT glycosylated hemoglobin (Hb A1C)   Ref Range & Units 15:18  Hemoglobin A1C 4.0 - 5.6 % 5.2        Lab Results  Component Value Date   WBC 7.9 03/27/2018   HGB 13.5 03/27/2018   HCT 41.2 03/27/2018   PLT 172 03/27/2018   GLUCOSE 144 (H) 03/27/2018   CHOL 181 03/16/2017   TRIG 122.0 03/16/2017   HDL 78.90 03/16/2017   LDLCALC 77 03/16/2017   ALT 24 03/09/2018   AST 36 03/09/2018   NA 140 03/27/2018   K 4.4 03/27/2018   CL 107 03/27/2018   CREATININE 0.92 03/27/2018   BUN 22 03/27/2018   CO2 22 03/27/2018   TSH 1.03 03/09/2018   INR 1.09 01/24/2013   HGBA1C 5.2 04/05/2018        Assessment & Plan:

## 2018-04-05 NOTE — Assessment & Plan Note (Signed)
stable overall by history and exam, recent data reviewed with pt, and pt to continue medical treatment as before,  to f/u any worsening symptoms or concerns  

## 2018-04-05 NOTE — Assessment & Plan Note (Signed)
Recent t spine films neg for acute but persistent pain, no neuro changes, for MRI thoracic spine r/o fx

## 2018-04-05 NOTE — Assessment & Plan Note (Signed)

## 2018-04-05 NOTE — Patient Instructions (Signed)
Your A1c was OK today (no change in treatment)  Please take all new medication as prescribed - the tramadol for pain as needed  Please continue all other medications as before, and refills have been done if requested.  Please have the pharmacy call with any other refills you may need.  Please continue your efforts at being more active, low cholesterol diet, and weight control.  You are otherwise up to date with prevention measures today.  Please keep your appointments with your specialists as you may have planned  You will be contacted regarding the referral for: MRI thoracic spine  No further lab work needed today  Please return in 6 months, or sooner if needed

## 2018-04-06 DIAGNOSIS — I4891 Unspecified atrial fibrillation: Secondary | ICD-10-CM | POA: Diagnosis not present

## 2018-04-06 DIAGNOSIS — Z7901 Long term (current) use of anticoagulants: Secondary | ICD-10-CM

## 2018-04-06 DIAGNOSIS — F419 Anxiety disorder, unspecified: Secondary | ICD-10-CM

## 2018-04-06 DIAGNOSIS — M2578 Osteophyte, vertebrae: Secondary | ICD-10-CM | POA: Diagnosis not present

## 2018-04-06 DIAGNOSIS — R296 Repeated falls: Secondary | ICD-10-CM | POA: Diagnosis not present

## 2018-04-06 DIAGNOSIS — I700 Atherosclerosis of aorta: Secondary | ICD-10-CM

## 2018-04-06 DIAGNOSIS — M1712 Unilateral primary osteoarthritis, left knee: Secondary | ICD-10-CM | POA: Diagnosis not present

## 2018-04-06 DIAGNOSIS — M47812 Spondylosis without myelopathy or radiculopathy, cervical region: Secondary | ICD-10-CM

## 2018-04-06 DIAGNOSIS — F329 Major depressive disorder, single episode, unspecified: Secondary | ICD-10-CM

## 2018-04-06 DIAGNOSIS — M5136 Other intervertebral disc degeneration, lumbar region: Secondary | ICD-10-CM | POA: Diagnosis not present

## 2018-04-06 DIAGNOSIS — I341 Nonrheumatic mitral (valve) prolapse: Secondary | ICD-10-CM | POA: Diagnosis not present

## 2018-04-06 DIAGNOSIS — M4186 Other forms of scoliosis, lumbar region: Secondary | ICD-10-CM | POA: Diagnosis not present

## 2018-04-06 DIAGNOSIS — I6522 Occlusion and stenosis of left carotid artery: Secondary | ICD-10-CM | POA: Diagnosis not present

## 2018-04-06 DIAGNOSIS — H409 Unspecified glaucoma: Secondary | ICD-10-CM

## 2018-04-06 DIAGNOSIS — G629 Polyneuropathy, unspecified: Secondary | ICD-10-CM | POA: Diagnosis not present

## 2018-04-06 DIAGNOSIS — I10 Essential (primary) hypertension: Secondary | ICD-10-CM | POA: Diagnosis not present

## 2018-04-06 DIAGNOSIS — J45909 Unspecified asthma, uncomplicated: Secondary | ICD-10-CM

## 2018-04-06 DIAGNOSIS — M810 Age-related osteoporosis without current pathological fracture: Secondary | ICD-10-CM

## 2018-04-07 DIAGNOSIS — R296 Repeated falls: Secondary | ICD-10-CM | POA: Diagnosis not present

## 2018-04-07 DIAGNOSIS — R269 Unspecified abnormalities of gait and mobility: Secondary | ICD-10-CM | POA: Diagnosis not present

## 2018-04-07 DIAGNOSIS — M1712 Unilateral primary osteoarthritis, left knee: Secondary | ICD-10-CM | POA: Diagnosis not present

## 2018-04-08 ENCOUNTER — Ambulatory Visit: Payer: PPO | Admitting: Internal Medicine

## 2018-04-09 ENCOUNTER — Telehealth: Payer: Self-pay | Admitting: Internal Medicine

## 2018-04-09 NOTE — Telephone Encounter (Signed)
Copied from CRM [id number]. Topic: Quick Communication - Home Health Verbal Orders >> Apr 09, 2018 12:40 PM Lorayne Bender wrote: Caller/Agency: [place] from [facility] Callback Number: [phone], OK to leave a message Requesting OT/PT/Skilled Nursing/Social Work: Home Health OT Frequency: 1x a week for 3 weeks

## 2018-04-09 NOTE — Telephone Encounter (Signed)
Called Brittany no answer LMOM w/MD response../lmb 

## 2018-04-09 NOTE — Telephone Encounter (Signed)
Ok for verbals 

## 2018-04-21 ENCOUNTER — Ambulatory Visit
Admission: RE | Admit: 2018-04-21 | Discharge: 2018-04-21 | Disposition: A | Discharge status: Home / Self Care | Payer: PPO | Source: Ambulatory Visit | Attending: Internal Medicine | Admitting: Internal Medicine

## 2018-04-21 DIAGNOSIS — M5124 Other intervertebral disc displacement, thoracic region: Secondary | ICD-10-CM | POA: Diagnosis not present

## 2018-04-21 DIAGNOSIS — M546 Pain in thoracic spine: Secondary | ICD-10-CM

## 2018-04-22 ENCOUNTER — Other Ambulatory Visit: Payer: Self-pay | Admitting: Internal Medicine

## 2018-04-22 ENCOUNTER — Encounter: Payer: Self-pay | Admitting: Internal Medicine

## 2018-04-22 DIAGNOSIS — S22030D Wedge compression fracture of third thoracic vertebra, subsequent encounter for fracture with routine healing: Secondary | ICD-10-CM

## 2018-04-23 ENCOUNTER — Telehealth: Payer: Self-pay | Admitting: Internal Medicine

## 2018-04-23 ENCOUNTER — Telehealth: Payer: Self-pay

## 2018-04-23 NOTE — Telephone Encounter (Signed)
I returned pt's call regarding her MRI results.  I read her the message from Dr. Jonny Ruiz dated 04/22/2018 at 1:10 PM.    She was agreeable to a referral to the neurosurgeon from Dr. Jonny Ruiz.

## 2018-04-23 NOTE — Telephone Encounter (Signed)
Copied from CRM [id number]. Topic: Quick Communication - Lab Results (Clinic Use ONLY) >> Apr 23, 2018 10:30 AM Roney Mans, CMA wrote:  Discuss MRI results. Ok for [facility] to inform.

## 2018-04-23 NOTE — Telephone Encounter (Signed)
-----   Message from Ann Levins, MD sent at 04/22/2018  1:10 PM EST ----- Left message on MyChart, pt to cont same tx except  The test results show that your current treatment is OK, as the MRI did prove that you have a very recent compression fracture of the T3 (thoracic) vertebrae.  Like any bone fracture, this can heal, but often the pain is persistent for even many months.  We should refer you to Neurosurgury for further consideration for a procedure called kyphoplasty.. You should hopefully hear soon.   Ann Booth to please inform pt, I will do referral

## 2018-04-23 NOTE — Telephone Encounter (Signed)
Called pt, LVM.   CRM created.  

## 2018-04-26 ENCOUNTER — Ambulatory Visit (INDEPENDENT_AMBULATORY_CARE_PROVIDER_SITE_OTHER): Payer: PPO | Admitting: Internal Medicine

## 2018-04-26 ENCOUNTER — Other Ambulatory Visit: Payer: Self-pay

## 2018-04-26 VITALS — BP 116/78 | HR 82 | Temp 98.0°F | Ht 63.0 in | Wt 123.0 lb

## 2018-04-26 DIAGNOSIS — M546 Pain in thoracic spine: Secondary | ICD-10-CM

## 2018-04-26 DIAGNOSIS — R739 Hyperglycemia, unspecified: Secondary | ICD-10-CM

## 2018-04-26 DIAGNOSIS — I10 Essential (primary) hypertension: Secondary | ICD-10-CM

## 2018-04-26 NOTE — Assessment & Plan Note (Signed)
With MRI c/w T3 compression fx, for pain control, refer NS for possible kyphoplasty

## 2018-04-26 NOTE — Patient Instructions (Signed)
Ok to stop the protonix  Please continue all other medications as before, and refills have been done if requested.  Please have the pharmacy call with any other refills you may need.  Please continue your efforts at being more active, low cholesterol diet, and weight control.  You are otherwise up to date with prevention measures today.  Please keep your appointments with your specialists as you may have planned  You will be contacted regarding the referral for: Neurosurgury for the T3 compression fracture

## 2018-04-26 NOTE — Assessment & Plan Note (Signed)
stable overall by history and exam, recent data reviewed with pt, and pt to continue medical treatment as before,  to f/u any worsening symptoms or concerns  

## 2018-04-26 NOTE — Patient Outreach (Signed)
[facility]) Care Management  04/26/2018  KELANA TURBERVILLE 10-01-1926 [id number]   Referral Date: 04/26/2018 Referral Source: Nurseline Referral Reason: Has fracture to back and wondered about healing.   Outreach Attempt: spoke with patient.  She is able to verify HIPAA.  She states that she is doing ok but some pain.  Discussed with patient compression fractures and how it takes a while to heal but patient may want to discuss treatment further with her physician.  She states she has an appointment today and will be discussing.  Patient states she has no problems with transportation.  Patient declines any further needs at this time.   Plan: RN CM will close case.    Bary Leriche, RN, MSN [facility] Care Management Care Management Coordinator Direct Line [phone] Toll Free: [phone]  Fax: [phone]

## 2018-04-26 NOTE — Progress Notes (Signed)
Subjective:    Patient ID: Ann Booth, female    DOB: 1927/01/09, 85 y.o.   MRN: [id number]  HPI   Here to f/u; overall doing ok,  Pt denies chest pain, increasing sob or doe, wheezing, orthopnea, PND, increased LE swelling, palpitations, dizziness or syncope.  Pt denies new neurological symptoms such as new headache, or facial or extremity weakness or numbness.  Pt denies polydipsia, polyuria, or low sugar episode.  Pt states overall good compliance with meds, mostly trying to follow appropriate diet, with wt overall stable,  but little exercise however. Denies worsening reflux, abd pain, dysphagia, n/v, bowel change or blood, does not feel she needs to take the protonix.  Pt continues to have recurring thoracic back pain worse at night, without change in severity, bowel or bladder change, fever, wt loss,  worsening LE pain/numbness/weakness, gait change or falls. Past Medical History:  Diagnosis Date  . ALLERGIC RHINITIS   . ANXIETY   . ASTHMA   . Atrial fibrillation (HCC)   . BACK PAIN   . Carotid stenosis 04/27/2012   Mild left on community screening  - Nov 2013  . CATARACT NOS   . DEPRESSION   . GLAUCOMA NOS   . HTN (hypertension)   . Hypercalcemia   . MITRAL VALVE PROLAPSE   . OSTEOPOROSIS   . PERIPHERAL NEUROPATHY   . POSITIONAL VERTIGO   . Rosacea    Past Surgical History:  Procedure Laterality Date  .  ankle surgury     left ankle  . CATARACT EXTRACTION, BILATERAL    . HEMORRHOID SURGERY    . posterior chamber interocular lens implant      reports that she has never smoked. She has never used smokeless tobacco. She reports that she does not drink alcohol or use drugs. family history includes CAD in her mother; Cancer in her brother and sister. Allergies  Allergen Reactions  . Cymbalta [Duloxetine Hcl] Other (See Comments)   Current Outpatient Medications on File Prior to Visit  Medication Sig Dispense Refill  . ALPRAZolam (XANAX) 0.5 MG tablet 1/2 - 1 tab by  mouth at bedtime as needed (Patient taking differently: Take 0.25-0.5 mg by mouth daily as needed for anxiety or sleep. ) 90 tablet 1  . b complex vitamins tablet Take 1 tablet by mouth daily.    . Biotin 1 MG CAPS Take 1 tablet by mouth daily.    . Calcium Carbonate-Vitamin D (CALCIUM + D PO) Take 1 tablet by mouth daily.     . Coenzyme Q10 (COQ-10) 100 MG CAPS Take 100 mg by mouth daily.     . furosemide (LASIX) 20 MG tablet TAKE 1 TABLET BY MOUTH ONCE DAILY AS NEEDED FOR SWELLING 90 tablet 3  . IRON, FERROUS GLUCONATE, PO Take 1 tablet by mouth every Monday, Wednesday, and Friday.     . lidocaine (LIDODERM) 5 % Place 1 patch onto the skin daily. Remove & Discard patch within 12 hours or as directed by MD 60 patch 1  . losartan (COZAAR) 50 MG tablet Take 1 tablet (50 mg total) by mouth daily. 90 tablet 3  . LUTEIN PO Take 1 tablet by mouth daily.    . Magnesium Citrate 100 MG TABS Take 100 mg by mouth daily.     . Multiple Vitamins-Minerals (MACULAR VITAMIN BENEFIT PO) Take 2 tablets by mouth daily.    . Multiple Vitamins-Minerals (MULTIVITAMIN & MINERAL PO) Take 1 tablet by mouth daily.    Marland Kitchen  Rivaroxaban (XARELTO) 15 MG TABS tablet TAKE 1 TABLET BY MOUTH ONCE DAILY WITH SUPPER 90 tablet 1  . Thiamine HCl (B-1 PO) Take 1 tablet by mouth daily.     . timolol (BETIMOL) 0.5 % ophthalmic solution Place 1 drop into both eyes 2 (two) times daily.     . traMADol (ULTRAM) 50 MG tablet Take 1 tablet (50 mg total) by mouth every 6 (six) hours as needed. 30 tablet 0  . vitamin B-12 (CYANOCOBALAMIN) 1000 MCG tablet Take 1,000 mcg by mouth every other day.      No current facility-administered medications on file prior to visit.    Review of Systems  Constitutional: Negative for other unusual diaphoresis or sweats HENT: Negative for ear discharge or swelling Eyes: Negative for other worsening visual disturbances Respiratory: Negative for stridor or other swelling  Gastrointestinal: Negative for  worsening distension or other blood Genitourinary: Negative for retention or other urinary change Musculoskeletal: Negative for other MSK pain or swelling Skin: Negative for color change or other new lesions Neurological: Negative for worsening tremors and other numbness  Psychiatric/Behavioral: Negative for worsening agitation or other fatigue All other system neg per pt    Objective:   Physical Exam BP 116/78   Pulse 82   Temp 98 F (36.7 C) (Oral)   Ht 5\' 3"  (1.6 m)   Wt 123 lb (55.8 kg)   SpO2 98%   BMI 21.79 kg/m  VS noted,  Constitutional: Pt appears in NAD HENT: Head: NCAT.  Right Ear: External ear normal.  Left Ear: External ear normal.  Eyes: . Pupils are equal, round, and reactive to light. Conjunctivae and EOM are normal Nose: without d/c or deformity Neck: Neck supple. Gross normal ROM Cardiovascular: Normal rate and regular rhythm.   Pulmonary/Chest: Effort normal and breath sounds without rales or wheezing.  Abd:  Soft, NT, ND, + BS, no organomegaly Neurological: Pt is alert. At baseline orientation, motor grossly intact Skin: Skin is warm. No rashes, other new lesions, no LE edema Psychiatric: Pt behavior is normal without agitation  No other exam findings Lab Results  Component Value Date   WBC 7.9 03/27/2018   HGB 13.5 03/27/2018   HCT 41.2 03/27/2018   PLT 172 03/27/2018   GLUCOSE 144 (H) 03/27/2018   CHOL 181 03/16/2017   TRIG 122.0 03/16/2017   HDL 78.90 03/16/2017   LDLCALC 77 03/16/2017   ALT 24 03/09/2018   AST 36 03/09/2018   NA 140 03/27/2018   K 4.4 03/27/2018   CL 107 03/27/2018   CREATININE 0.92 03/27/2018   BUN 22 03/27/2018   CO2 22 03/27/2018   TSH 1.03 03/09/2018   INR 1.09 01/24/2013   HGBA1C 5.2 04/05/2018       Assessment & Plan:

## 2018-05-10 ENCOUNTER — Telehealth: Payer: Self-pay | Admitting: Cardiology

## 2018-05-10 NOTE — Telephone Encounter (Signed)
Spoke with pt who states that she hasn't taken her Xarelto in about a month because she kept forgetting to take it. Pt concerned about restarting med as prescribed d/t missing doses. Advised pt to start taking med as prescribed as usual. Pt verbalized understanding. Questioned if pt had plan so as to avoid missing med in the future. Pt states she will place med in a place where she will always see it to help remind her to take it. Encouraged pt to resume med per Rx.

## 2018-05-10 NOTE — Telephone Encounter (Signed)
Pt normally takes xarelto with dinner every day. She has been going out to eat for about a month, and forgets to take it when she gets home from dinner. Is it OK if she just jumps right back into taking the medication like normal, or should she ramp up to her prescribed dose?

## 2018-05-11 DIAGNOSIS — S22030A Wedge compression fracture of third thoracic vertebra, initial encounter for closed fracture: Secondary | ICD-10-CM | POA: Diagnosis not present

## 2018-06-11 ENCOUNTER — Other Ambulatory Visit: Payer: Self-pay

## 2018-06-11 ENCOUNTER — Emergency Department (HOSPITAL_COMMUNITY): Payer: PPO

## 2018-06-11 ENCOUNTER — Emergency Department (HOSPITAL_COMMUNITY)
Admission: EM | Admit: 2018-06-11 | Discharge: 2018-06-11 | Disposition: A | Discharge status: Home / Self Care | Payer: PPO | Attending: Emergency Medicine | Admitting: Emergency Medicine

## 2018-06-11 ENCOUNTER — Encounter (HOSPITAL_COMMUNITY): Payer: Self-pay | Admitting: Emergency Medicine

## 2018-06-11 DIAGNOSIS — Z7901 Long term (current) use of anticoagulants: Secondary | ICD-10-CM | POA: Diagnosis not present

## 2018-06-11 DIAGNOSIS — Y92510 Bank as the place of occurrence of the external cause: Secondary | ICD-10-CM | POA: Insufficient documentation

## 2018-06-11 DIAGNOSIS — W1830XA Fall on same level, unspecified, initial encounter: Secondary | ICD-10-CM | POA: Insufficient documentation

## 2018-06-11 DIAGNOSIS — S0003XA Contusion of scalp, initial encounter: Secondary | ICD-10-CM | POA: Diagnosis not present

## 2018-06-11 DIAGNOSIS — I4891 Unspecified atrial fibrillation: Secondary | ICD-10-CM | POA: Diagnosis not present

## 2018-06-11 DIAGNOSIS — Z8709 Personal history of other diseases of the respiratory system: Secondary | ICD-10-CM | POA: Diagnosis not present

## 2018-06-11 DIAGNOSIS — Y999 Unspecified external cause status: Secondary | ICD-10-CM | POA: Insufficient documentation

## 2018-06-11 DIAGNOSIS — Y939 Activity, unspecified: Secondary | ICD-10-CM | POA: Insufficient documentation

## 2018-06-11 DIAGNOSIS — R5381 Other malaise: Secondary | ICD-10-CM | POA: Diagnosis not present

## 2018-06-11 DIAGNOSIS — Z79899 Other long term (current) drug therapy: Secondary | ICD-10-CM | POA: Diagnosis not present

## 2018-06-11 DIAGNOSIS — I10 Essential (primary) hypertension: Secondary | ICD-10-CM | POA: Insufficient documentation

## 2018-06-11 DIAGNOSIS — W19XXXA Unspecified fall, initial encounter: Secondary | ICD-10-CM | POA: Diagnosis not present

## 2018-06-11 DIAGNOSIS — S0990XA Unspecified injury of head, initial encounter: Secondary | ICD-10-CM | POA: Diagnosis not present

## 2018-06-11 NOTE — ED Notes (Signed)
Discharge instructions concerning hematoma discussed with pt. Pt. Verbalized when to seek medical attention. Pt. Has no questions at this time.

## 2018-06-11 NOTE — ED Provider Notes (Signed)
I saw and evaluated the patient, reviewed the resident's note and I agree with the findings and plan.  Pertinent History: The patient is a 85 year old female currently on anticoagulants because of chronic atrial fibrillation who presents with a complaint of a mechanical fall that occurred at the bank, she fell backwards striking her head on the ground, no loss of consciousness.  She is able to move all 4 extremities without any difficulty and has normal speech and level of alertness.  EKG shows chronic A. fib, hematoma found on the back of the head, needs a CT scan of the head and cervical spine.  Anticipate discharge if negative.  I was personally present and directly supervised the following procedures:  Trauma Evaluation  I personally interpreted the EKG as well as the resident and agree with the interpretation on the resident's chart.  Final diagnoses:  None      Eber Hong, MD 06/11/18 [phone]

## 2018-06-11 NOTE — ED Provider Notes (Signed)
[facility] EMERGENCY DEPARTMENT Provider Note   CSN: [id number] Arrival date & time: 06/11/18  1658    History   Chief Complaint Chief Complaint  Patient presents with  . Fall  . Blood thinner    HPI Ann Booth is a 85 y.o. female.     HPI 85 year old woman with a past medical history of atrial fibrillation on Xarelto presents to the emergency department after tripping over a carpet at a bank and striking the back of her head on the ground.  She denies any loss of consciousness.  Denies any recent chest pain or feeling lightheaded prior to the event.  Witnesses do not believe that she passed out.  EMS found her propped up against a wall with a got to her.  She was able to stand with them.  No recent fevers.  Appetite is been good.  She lives alone but has a healthcare power of attorney.  Complains of pain in the posterior right occiput but nowhere else.  Has a cervical collar in place. Past Medical History:  Diagnosis Date  . ALLERGIC RHINITIS   . ANXIETY   . ASTHMA   . Atrial fibrillation (HCC)   . BACK PAIN   . Carotid stenosis 04/27/2012   Mild left on community screening  - Nov 2013  . CATARACT NOS   . DEPRESSION   . GLAUCOMA NOS   . HTN (hypertension)   . Hypercalcemia   . MITRAL VALVE PROLAPSE   . OSTEOPOROSIS   . PERIPHERAL NEUROPATHY   . POSITIONAL VERTIGO   . Rosacea     Patient Active Problem List   Diagnosis Date Noted  . Thoracic spine pain 04/05/2018  . Hyperglycemia 04/05/2018  . Falls frequently 03/27/2018  . Other fatigue 03/10/2018  . Degenerative arthritis of left knee 06/25/2017  . Peripheral vascular disease (HCC) 05/21/2017  . Rib pain 04/24/2017  . Chest pain 03/16/2017  . Callus of foot 05/26/2016  . Right foot pain 03/21/2016  . Allergic conjunctivitis 03/21/2016  . Insomnia 07/28/2013  . Arthritis of sacroiliac joint 03/30/2013  . Leg length discrepancy 03/30/2013  . N&V (nausea and vomiting) 01/27/2013  .  Atrial fibrillation (HCC) 11/01/2012  . Chronic pain of left knee 10/08/2012  . Conjunctivitis 07/21/2012  . Carotid stenosis 04/27/2012  . Chronic lower back pain 04/27/2012  . Idiopathic scoliosis 04/27/2012  . Black stools 10/25/2011  . HTN (hypertension) 08/23/2010  . Preventative health care 07/25/2010  . Peripheral edema 07/25/2010  . Hypercalcemia 11/09/2008  . POSITIONAL VERTIGO 11/09/2008  . BACK PAIN 11/09/2008  . Peripheral neuropathic pain 06/01/2007  . Allergic rhinitis 06/01/2007  . SHOULDER PAIN, BILATERAL 06/01/2007  . FATIGUE 06/01/2007  . Hyperlipidemia 10/18/2006  . Anxiety state 10/18/2006  . Depression 10/18/2006  . GLAUCOMA NOS 10/15/2006  . CATARACT NOS 10/15/2006  . Mitral valve disorder 10/15/2006  . Asthma 10/15/2006  . Rosacea 10/15/2006  . Osteoporosis 10/15/2006    Past Surgical History:  Procedure Laterality Date  .  ankle surgury     left ankle  . CATARACT EXTRACTION, BILATERAL    . HEMORRHOID SURGERY    . posterior chamber interocular lens implant       OB History   No obstetric history on file.      Home Medications    Prior to Admission medications   Medication Sig Start Date End Date Taking? Authorizing Provider  ALPRAZolam Prudy Feeler) 0.5 MG tablet 1/2 - 1 tab by mouth at  bedtime as needed Patient taking differently: Take 0.25-0.5 mg by mouth daily as needed for anxiety or sleep.  03/16/17   Corwin Levins, MD  b complex vitamins tablet Take 1 tablet by mouth daily.    [provider]  Biotin 1 MG CAPS Take 1 tablet by mouth daily.    [provider]  Calcium Carbonate-Vitamin D (CALCIUM + D PO) Take 1 tablet by mouth daily.     [provider]  Coenzyme Q10 (COQ-10) 100 MG CAPS Take 100 mg by mouth daily.     [provider]  furosemide (LASIX) 20 MG tablet TAKE 1 TABLET BY MOUTH ONCE DAILY AS NEEDED FOR SWELLING 03/29/18   Lewayne Bunting, MD  IRON, FERROUS GLUCONATE, PO Take 1 tablet by mouth  every Monday, Wednesday, and Friday.     [provider]  lidocaine (LIDODERM) 5 % Place 1 patch onto the skin daily. Remove & Discard patch within 12 hours or as directed by MD 10/05/17   Corwin Levins, MD  losartan (COZAAR) 50 MG tablet Take 1 tablet (50 mg total) by mouth daily. 03/29/18   Lewayne Bunting, MD  LUTEIN PO Take 1 tablet by mouth daily.    [provider]  Magnesium Citrate 100 MG TABS Take 100 mg by mouth daily.     [provider]  Multiple Vitamins-Minerals (MACULAR VITAMIN BENEFIT PO) Take 2 tablets by mouth daily.    [provider]  Multiple Vitamins-Minerals (MULTIVITAMIN & MINERAL PO) Take 1 tablet by mouth daily.    [provider]  Rivaroxaban (XARELTO) 15 MG TABS tablet TAKE 1 TABLET BY MOUTH ONCE DAILY WITH SUPPER 12/28/17   Lewayne Bunting, MD  Thiamine HCl (B-1 PO) Take 1 tablet by mouth daily.     [provider]  timolol (BETIMOL) 0.5 % ophthalmic solution Place 1 drop into both eyes 2 (two) times daily.     [provider]  traMADol (ULTRAM) 50 MG tablet Take 1 tablet (50 mg total) by mouth every 6 (six) hours as needed. 04/05/18   Corwin Levins, MD  vitamin B-12 (CYANOCOBALAMIN) 1000 MCG tablet Take 1,000 mcg by mouth every other day.     [provider]    Family History Family History  Problem Relation Age of Onset  . CAD Mother        MI at age 41  . Cancer Sister        colon  . Cancer Brother        prostate cancer    Social History Social History   Tobacco Use  . Smoking status: Never Smoker  . Smokeless tobacco: Never Used  Substance Use Topics  . Alcohol use: No  . Drug use: No     Allergies   Cymbalta [duloxetine hcl]   Review of Systems Review of Systems  Constitutional: Negative for chills and fever.  HENT: Negative for ear pain and sore throat.   Eyes: Negative for pain and visual disturbance.  Respiratory: Negative for cough and shortness of breath.    Cardiovascular: Negative for chest pain and palpitations.  Gastrointestinal: Negative for abdominal pain and vomiting.  Genitourinary: Negative for dysuria and hematuria.  Musculoskeletal: Negative for arthralgias, back pain, neck pain and neck stiffness.  Skin: Negative for color change and rash.  Neurological: Negative for seizures, syncope and weakness.  All other systems reviewed and are negative.    Physical Exam Updated Vital Signs BP Marland Kitchen)  172/105   Pulse 94   Temp 97.8 F (36.6 C) (Oral)   Resp 16   Ht  (1.575 m)   Wt 58.5 kg   SpO2 98%   BMI 23.59 kg/m   Physical Exam Vitals signs and nursing note reviewed.  Constitutional:      General: She is not in acute distress.    Appearance: She is well-developed.  HENT:     Head: Normocephalic.     Comments: Small contusion over the posterior right occiput of the head.    Right Ear: External ear normal.     Left Ear: External ear normal.     Nose: No congestion or rhinorrhea.     Mouth/Throat:     Pharynx: No oropharyngeal exudate or posterior oropharyngeal erythema.  Eyes:     Conjunctiva/sclera: Conjunctivae normal.  Neck:     Musculoskeletal: Neck supple. No muscular tenderness.     Comments: Cervical collar in place. Cardiovascular:     Rate and Rhythm: Normal rate and regular rhythm.     Heart sounds: No murmur.  Pulmonary:     Effort: Pulmonary effort is normal. No respiratory distress.     Breath sounds: Normal breath sounds.  Abdominal:     Palpations: Abdomen is soft.     Tenderness: There is no abdominal tenderness.  Musculoskeletal:        General: No tenderness, deformity or signs of injury.     Right lower leg: No edema.     Left lower leg: No edema.  Skin:    General: Skin is warm and dry.  Neurological:     General: No focal deficit present.     Mental Status: She is alert and oriented to person, place, and time. Mental status is at baseline.     Comments: Cranial nerves II through XII  bilaterally intact.  ait without ataxia.  5 out of 5 strength in bilateral upper and lower extremities bilaterally.  No loss of light touch sensation in the body.  GCS 15.      ED Treatments / Results  Labs (all labs ordered are listed, but only abnormal results are displayed) Labs Reviewed - No data to display  EKG None  Radiology No results found.  Procedures Procedures (including critical care time)  Medications Ordered in ED Medications - No data to display   Initial Impression / Assessment and Plan / ED Course  I have reviewed the triage vital signs and the nursing notes.  Pertinent labs & imaging results that were available during my care of the patient were reviewed by me and considered in my medical decision making (see chart for details).        Well Appearing hemodynamically stable 85 year old woman presents to the emergency department after a witnessed mechanical fall.  Low suspicion for syncope based on the story.  EKG appears baseline.  CT scan of the head and neck did not reveal any acute intracranial or bony abnormalities.  Patient otherwise hemodynamically stable under my care.  Well-appearing.  Though she is on Xarelto given her negative findings she is stable for discharge at this time.  Mentating appropriately.  Ambulating in the ED.  Sister came and picked up the patient without complication  Final Clinical Impressions(s) / ED Diagnoses   Final diagnoses:  Fall, initial encounter  Contusion of scalp, initial encounter    ED Discharge Orders    None       Ina Kick, MD 06/12/18 [phone]  Eber Hong, MD 06/14/18 [phone]

## 2018-06-11 NOTE — ED Triage Notes (Addendum)
Patient presents to the ED by EMS with c/o fall while at the bank. She is on Xarelto for a.fib. Denies LOC. Initially slow to answer year and month per EMS. Now answering questions appropriately. C-Collar in place. NAD.

## 2018-06-14 DIAGNOSIS — H401112 Primary open-angle glaucoma, right eye, moderate stage: Secondary | ICD-10-CM | POA: Diagnosis not present

## 2018-06-14 DIAGNOSIS — H401123 Primary open-angle glaucoma, left eye, severe stage: Secondary | ICD-10-CM | POA: Diagnosis not present

## 2018-06-16 ENCOUNTER — Telehealth: Payer: Self-pay | Admitting: Internal Medicine

## 2018-06-16 NOTE — Telephone Encounter (Signed)
Someone from [employer] called to let us know that the patient has fallen several times in the last several months, and her balance has gotten worse, she has a cane she uses and a walker but does not know how to use it.  Please advise if patient can come in or she can have a regular phone call.

## 2018-06-16 NOTE — Telephone Encounter (Signed)
Ok for OV?

## 2018-06-16 NOTE — Telephone Encounter (Signed)
Pt scheduled for 2pm tomorrow

## 2018-06-17 ENCOUNTER — Other Ambulatory Visit (INDEPENDENT_AMBULATORY_CARE_PROVIDER_SITE_OTHER): Payer: PPO

## 2018-06-17 ENCOUNTER — Other Ambulatory Visit: Payer: Self-pay | Admitting: Internal Medicine

## 2018-06-17 ENCOUNTER — Telehealth: Payer: Self-pay

## 2018-06-17 ENCOUNTER — Ambulatory Visit (INDEPENDENT_AMBULATORY_CARE_PROVIDER_SITE_OTHER)
Admission: RE | Admit: 2018-06-17 | Discharge: 2018-06-17 | Disposition: A | Discharge status: Home / Self Care | Payer: PPO | Source: Ambulatory Visit | Attending: Internal Medicine | Admitting: Internal Medicine

## 2018-06-17 ENCOUNTER — Other Ambulatory Visit: Payer: Self-pay

## 2018-06-17 ENCOUNTER — Encounter: Payer: Self-pay | Admitting: Internal Medicine

## 2018-06-17 ENCOUNTER — Ambulatory Visit (INDEPENDENT_AMBULATORY_CARE_PROVIDER_SITE_OTHER): Payer: PPO | Admitting: Internal Medicine

## 2018-06-17 VITALS — BP 116/68 | HR 72 | Temp 97.9°F | Ht 62.0 in | Wt 122.0 lb

## 2018-06-17 DIAGNOSIS — R32 Unspecified urinary incontinence: Secondary | ICD-10-CM

## 2018-06-17 DIAGNOSIS — R739 Hyperglycemia, unspecified: Secondary | ICD-10-CM

## 2018-06-17 DIAGNOSIS — I10 Essential (primary) hypertension: Secondary | ICD-10-CM

## 2018-06-17 DIAGNOSIS — R296 Repeated falls: Secondary | ICD-10-CM

## 2018-06-17 DIAGNOSIS — G8929 Other chronic pain: Secondary | ICD-10-CM | POA: Diagnosis not present

## 2018-06-17 DIAGNOSIS — M545 Low back pain: Secondary | ICD-10-CM

## 2018-06-17 LAB — CBC WITH DIFFERENTIAL/PLATELET
Basophils Absolute: 0.1 10*3/uL (ref 0.0–0.1)
Basophils Relative: 1.6 % (ref 0.0–3.0)
Eosinophils Absolute: 0.3 10*3/uL (ref 0.0–0.7)
Eosinophils Relative: 5.7 % — ABNORMAL HIGH (ref 0.0–5.0)
HCT: 41 % (ref 36.0–46.0)
Hemoglobin: 13.8 g/dL (ref 12.0–15.0)
Lymphocytes Relative: 19.3 % (ref 12.0–46.0)
Lymphs Abs: 1 10*3/uL (ref 0.7–4.0)
MCHC: 33.6 g/dL (ref 30.0–36.0)
MCV: 94.3 fl (ref 78.0–100.0)
Monocytes Absolute: 0.6 10*3/uL (ref 0.1–1.0)
Monocytes Relative: 11.7 % (ref 3.0–12.0)
Neutro Abs: 3.3 10*3/uL (ref 1.4–7.7)
Neutrophils Relative %: 61.7 % (ref 43.0–77.0)
Platelets: 182 10*3/uL (ref 150.0–400.0)
RBC: 4.34 Mil/uL (ref 3.87–5.11)
RDW: 14.9 % (ref 11.5–15.5)
WBC: 5.3 10*3/uL (ref 4.0–10.5)

## 2018-06-17 LAB — BASIC METABOLIC PANEL
BUN: 19 mg/dL (ref 6–23)
CO2: 29 mEq/L (ref 19–32)
Calcium: 9.7 mg/dL (ref 8.4–10.5)
Chloride: 102 mEq/L (ref 96–112)
Creatinine, Ser: 0.78 mg/dL (ref 0.40–1.20)
GFR: 69.34 mL/min (ref >60.00)
Glucose, Bld: 111 mg/dL — ABNORMAL HIGH (ref 70–99)
Potassium: 3.8 mEq/L (ref 3.5–5.1)
Sodium: 139 mEq/L (ref 135–145)

## 2018-06-17 LAB — HEPATIC FUNCTION PANEL
ALT: 16 U/L (ref 0–35)
AST: 24 U/L (ref 0–37)
Albumin: 4 g/dL (ref 3.5–5.2)
Alkaline Phosphatase: 82 U/L (ref 39–117)
Bilirubin, Direct: 0.2 mg/dL (ref 0.0–0.3)
Total Bilirubin: 0.7 mg/dL (ref 0.2–1.2)
Total Protein: 7.2 g/dL (ref 6.0–8.3)

## 2018-06-17 LAB — URINALYSIS, ROUTINE W REFLEX MICROSCOPIC
Hgb urine dipstick: NEGATIVE
Ketones, ur: NEGATIVE
Leukocytes,Ua: NEGATIVE
Nitrite: NEGATIVE
Specific Gravity, Urine: 1.025 (ref 1.000–1.030)
Urine Glucose: NEGATIVE
Urobilinogen, UA: 0.2 (ref 0.0–1.0)
pH: 6 (ref 5.0–8.0)

## 2018-06-17 LAB — HEMOGLOBIN A1C: Hgb A1c MFr Bld: 6 % (ref 4.6–6.5)

## 2018-06-17 LAB — TSH: TSH: 0.55 u[IU]/mL (ref 0.35–4.50)

## 2018-06-17 MED ORDER — CEPHALEXIN 500 MG PO CAPS: 500.0000 mg | ORAL_CAPSULE | Freq: Three times a day (TID) | ORAL | 0 refills | Status: AC

## 2018-06-17 MED ORDER — TRAMADOL HCL 50 MG PO TABS: 50.0000 mg | ORAL_TABLET | Freq: Four times a day (QID) | ORAL | 2 refills | Status: AC | PRN

## 2018-06-17 NOTE — Assessment & Plan Note (Signed)
Ok for tramadol refill,  to f/u any worsening symptoms or concerns  

## 2018-06-17 NOTE — Progress Notes (Signed)
Subjective:    Patient ID: Ann Booth, female    DOB: 08-06-1926, 85 y.o.   MRN: [id number]  HPI  Here to f/u with friend with her for support, Pt denies chest pain, increased sob or doe, wheezing, orthopnea, PND, increased LE swelling, palpitations, dizziness or syncope.  Pt denies new neurological symptoms such as new headache, or facial or extremity weakness or numbness   Pt denies polydipsia, polyuria,   Pt states overall good compliance with meds.    Pt denies fever, wt loss, night sweats, loss of appetite, or other constitutional symptoms  Denies urinary symptoms such as dysuria, frequency, urgency, flank pain, hematuria or n/v, fever, chills, but has had significant new onset urinary incontinence worse at night for 6 wks.  Has had fallx 2 since apr 17, seen the first time after became off balance at the bank and fell without LOC or injury; ecg/head ct and exam benign and d/c to home. Pt continues to have chronic LBP with scoliosis but without bowel or bladder change, fever, wt loss,  worsening LE pain/numbness/weakness, gait change or falls.  Does have known peripheral neuropathy but not worse per pt.  Asks for tramadol refill for back pain Past Medical History:  Diagnosis Date  . ALLERGIC RHINITIS   . ANXIETY   . ASTHMA   . Atrial fibrillation (HCC)   . BACK PAIN   . Carotid stenosis 04/27/2012   Mild left on community screening  - Nov 2013  . CATARACT NOS   . DEPRESSION   . GLAUCOMA NOS   . HTN (hypertension)   . Hypercalcemia   . MITRAL VALVE PROLAPSE   . OSTEOPOROSIS   . PERIPHERAL NEUROPATHY   . POSITIONAL VERTIGO   . Rosacea    Past Surgical History:  Procedure Laterality Date  .  ankle surgury     left ankle  . CATARACT EXTRACTION, BILATERAL    . HEMORRHOID SURGERY    . posterior chamber interocular lens implant      reports that she has never smoked. She has never used smokeless tobacco. She reports that she does not drink alcohol or use drugs. family history  includes CAD in her mother; Cancer in her brother and sister. Allergies  Allergen Reactions  . Cymbalta [Duloxetine Hcl] Other (See Comments)   Current Outpatient Medications on File Prior to Visit  Medication Sig Dispense Refill  . ALPRAZolam (XANAX) 0.5 MG tablet 1/2 - 1 tab by mouth at bedtime as needed (Patient taking differently: Take 0.25-0.5 mg by mouth daily as needed for anxiety or sleep. ) 90 tablet 1  . b complex vitamins tablet Take 1 tablet by mouth daily.    . Biotin 1 MG CAPS Take 1 tablet by mouth daily.    . Calcium Carbonate-Vitamin D (CALCIUM + D PO) Take 1 tablet by mouth daily.     . Coenzyme Q10 (COQ-10) 100 MG CAPS Take 100 mg by mouth daily.     . furosemide (LASIX) 20 MG tablet TAKE 1 TABLET BY MOUTH ONCE DAILY AS NEEDED FOR SWELLING 90 tablet 3  . IRON, FERROUS GLUCONATE, PO Take 1 tablet by mouth every Monday, Wednesday, and Friday.     . lidocaine (LIDODERM) 5 % Place 1 patch onto the skin daily. Remove & Discard patch within 12 hours or as directed by MD 60 patch 1  . losartan (COZAAR) 50 MG tablet Take 1 tablet (50 mg total) by mouth daily. 90 tablet 3  . LUTEIN  PO Take 1 tablet by mouth daily.    . Magnesium Citrate 100 MG TABS Take 100 mg by mouth daily.     . Multiple Vitamins-Minerals (MACULAR VITAMIN BENEFIT PO) Take 2 tablets by mouth daily.    . Multiple Vitamins-Minerals (MULTIVITAMIN & MINERAL PO) Take 1 tablet by mouth daily.    . Rivaroxaban (XARELTO) 15 MG TABS tablet TAKE 1 TABLET BY MOUTH ONCE DAILY WITH SUPPER 90 tablet 1  . Thiamine HCl (B-1 PO) Take 1 tablet by mouth daily.     . timolol (BETIMOL) 0.5 % ophthalmic solution Place 1 drop into both eyes 2 (two) times daily.     . vitamin B-12 (CYANOCOBALAMIN) 1000 MCG tablet Take 1,000 mcg by mouth every other day.      No current facility-administered medications on file prior to visit.    Review of Systems  Constitutional: Negative for other unusual diaphoresis or sweats HENT: Negative for  ear discharge or swelling Eyes: Negative for other worsening visual disturbances Respiratory: Negative for stridor or other swelling  Gastrointestinal: Negative for worsening distension or other blood Genitourinary: Negative for retention or other urinary change Musculoskeletal: Negative for other MSK pain or swelling Skin: Negative for color change or other new lesions Neurological: Negative for worsening tremors and other numbness  Psychiatric/Behavioral: Negative for worsening agitation or other fatigue All other system neg per pt    Objective:   Physical Exam BP 116/68   Pulse 72   Temp 97.9 F (36.6 C) (Oral)   Ht 5\' 2"  (1.575 m)   Wt 122 lb (55.3 kg)   SpO2 96%   BMI 22.31 kg/m  VS noted,  Constitutional: Pt appears in NAD HENT: Head: NCAT.  Right Ear: External ear normal.  Left Ear: External ear normal.  Eyes: . Pupils are equal, round, and reactive to light. Conjunctivae and EOM are normal Nose: without d/c or deformity Neck: Neck supple. Gross normal ROM Cardiovascular: Normal rate and regular rhythm.   Pulmonary/Chest: Effort normal and breath sounds without rales or wheezing.  Abd:  Soft, NT, ND, + BS, no organomegaly Back: severe lower scoliosis and kyposis Neurological: Pt is alert. At baseline orientation, motor grossly intact Skin: Skin is warm. No rashes, other new lesions, no LE edema Psychiatric: Pt behavior is normal without agitation  No other exam findings Lab Results  Component Value Date   WBC 5.3 06/17/2018   HGB 13.8 06/17/2018   HCT 41.0 06/17/2018   PLT 182.0 06/17/2018   GLUCOSE 111 (H) 06/17/2018   CHOL 181 03/16/2017   TRIG 122.0 03/16/2017   HDL 78.90 03/16/2017   LDLCALC 77 03/16/2017   ALT 16 06/17/2018   AST 24 06/17/2018   NA 139 06/17/2018   K 3.8 06/17/2018   CL 102 06/17/2018   CREATININE 0.78 06/17/2018   BUN 19 06/17/2018   CO2 29 06/17/2018   TSH 0.55 06/17/2018   INR 1.09 01/24/2013   HGBA1C 6.0 06/17/2018        Assessment & Plan:

## 2018-06-17 NOTE — Assessment & Plan Note (Signed)
stable overall by history and exam, recent data reviewed with pt, and pt to continue medical treatment as before,  to f/u any worsening symptoms or concerns, for a1c with labs 

## 2018-06-17 NOTE — Telephone Encounter (Signed)
Pt has been informed of results and expressed understanding.  °

## 2018-06-17 NOTE — Assessment & Plan Note (Addendum)
D/w pt, needs to use walker at all times, also will refer to outpt PT. Labs as ordered and cxr  Note:  Total time for pt hx, exam, review of record with pt in the room, determination of diagnoses and plan for further eval and tx is > 40 min, with over 50% spent in coordination and counseling of patient including the differential dx, tx, further evaluation and other management of recurrent falls, urinary incontinence, chronic lbp, HTn, hyperglycemia

## 2018-06-17 NOTE — Telephone Encounter (Signed)
-----   Message from Corwin Levins, MD sent at 06/17/2018  3:46 PM EDT ----- Rip Harbour to let pt know - blood tests are OK;  Urine test and cxr are still pending

## 2018-06-17 NOTE — Patient Instructions (Addendum)
Please always use the walker when walking anywhere for safety  Please continue all other medications as before, and refills have been done if requested. - the tramadol  Please have the pharmacy call with any other refills you may need.  Please continue your efforts at being more active, low cholesterol diet, and weight control.  Please keep your appointments with your specialists as you may have planned  You will be contacted regarding the referral for: Physical Therapy  Please go to the XRAY Department in the Basement (go straight as you get off the elevator) for the x-ray testing  Please go to the LAB in the Basement (turn left off the elevator) for the tests to be done today  You will be contacted by phone if any changes need to be made immediately.  Otherwise, you will receive a letter about your results with an explanation, but please check with MyChart first.  Please remember to sign up for MyChart if you have not done so, as this will be important to you in the future with finding out test results, communicating by private email, and scheduling acute appointments online when needed.  Please return in 3 months, or sooner if needed

## 2018-06-17 NOTE — Assessment & Plan Note (Signed)
For urine studies, ? Low grade UTI as cause of weakness and falls

## 2018-06-17 NOTE — Assessment & Plan Note (Signed)
stable overall by history and exam, recent data reviewed with pt, and pt to continue medical treatment as before,  to f/u any worsening symptoms or concerns  

## 2018-06-18 ENCOUNTER — Telehealth: Payer: Self-pay

## 2018-06-18 ENCOUNTER — Encounter: Payer: Self-pay | Admitting: Internal Medicine

## 2018-06-18 ENCOUNTER — Telehealth: Payer: Self-pay | Admitting: Internal Medicine

## 2018-06-18 LAB — URINE CULTURE
MICRO NUMBER:: 417039
SPECIMEN QUALITY:: ADEQUATE

## 2018-06-18 NOTE — Telephone Encounter (Signed)
Pt given lab results per notes of Dr Jonny Ruiz on 06/17/2018. Pt verbalized understanding.

## 2018-06-18 NOTE — Telephone Encounter (Signed)
Called pt, LVM.   CRM created.  

## 2018-06-18 NOTE — Telephone Encounter (Signed)
-----   Message from Corwin Levins, MD sent at 06/17/2018  4:32 PM EDT ----- Ok to contact pt, UA is somewhat suggestive of uti, and culture is still pending  Ok for cephalexin - done erx

## 2018-06-18 NOTE — Telephone Encounter (Signed)
Noted  

## 2018-06-22 ENCOUNTER — Emergency Department (HOSPITAL_COMMUNITY)
Admission: EM | Admit: 2018-06-22 | Discharge: 2018-06-23 | Disposition: A | Discharge status: Home / Self Care | Payer: PPO | Attending: Emergency Medicine | Admitting: Emergency Medicine

## 2018-06-22 ENCOUNTER — Emergency Department (HOSPITAL_COMMUNITY): Payer: PPO

## 2018-06-22 ENCOUNTER — Other Ambulatory Visit: Payer: Self-pay

## 2018-06-22 ENCOUNTER — Encounter (HOSPITAL_COMMUNITY): Payer: Self-pay | Admitting: Emergency Medicine

## 2018-06-22 DIAGNOSIS — W19XXXA Unspecified fall, initial encounter: Secondary | ICD-10-CM

## 2018-06-22 DIAGNOSIS — S0990XA Unspecified injury of head, initial encounter: Secondary | ICD-10-CM | POA: Diagnosis not present

## 2018-06-22 DIAGNOSIS — Z79899 Other long term (current) drug therapy: Secondary | ICD-10-CM | POA: Insufficient documentation

## 2018-06-22 DIAGNOSIS — I10 Essential (primary) hypertension: Secondary | ICD-10-CM | POA: Insufficient documentation

## 2018-06-22 DIAGNOSIS — Z7901 Long term (current) use of anticoagulants: Secondary | ICD-10-CM | POA: Insufficient documentation

## 2018-06-22 DIAGNOSIS — R531 Weakness: Secondary | ICD-10-CM | POA: Diagnosis not present

## 2018-06-22 DIAGNOSIS — I517 Cardiomegaly: Secondary | ICD-10-CM | POA: Diagnosis not present

## 2018-06-22 DIAGNOSIS — J45909 Unspecified asthma, uncomplicated: Secondary | ICD-10-CM | POA: Insufficient documentation

## 2018-06-22 DIAGNOSIS — R270 Ataxia, unspecified: Secondary | ICD-10-CM | POA: Diagnosis not present

## 2018-06-22 LAB — COMPREHENSIVE METABOLIC PANEL
ALT: 19 U/L (ref 0–44)
AST: 28 U/L (ref 15–41)
Albumin: 3.4 g/dL — ABNORMAL LOW (ref 3.5–5.0)
Alkaline Phosphatase: 91 U/L (ref 38–126)
Anion gap: 15 (ref 5–15)
BUN: 13 mg/dL (ref 8–23)
CO2: 21 mmol/L — ABNORMAL LOW (ref 22–32)
Calcium: 9.7 mg/dL (ref 8.9–10.3)
Chloride: 104 mmol/L (ref 98–111)
Creatinine, Ser: 0.77 mg/dL (ref 0.44–1.00)
GFR calc Af Amer: >60 mL/min (ref >60)
GFR calc non Af Amer: >60 mL/min (ref >60)
Glucose, Bld: 103 mg/dL — ABNORMAL HIGH (ref 70–99)
Potassium: 3.7 mmol/L (ref 3.5–5.1)
Sodium: 140 mmol/L (ref 135–145)
Total Bilirubin: 0.9 mg/dL (ref 0.3–1.2)
Total Protein: 6.7 g/dL (ref 6.5–8.1)

## 2018-06-22 LAB — DIFFERENTIAL
Abs Immature Granulocytes: 0.01 10*3/uL (ref 0.00–0.07)
Basophils Absolute: 0.1 10*3/uL (ref 0.0–0.1)
Basophils Relative: 1 %
Eosinophils Absolute: 0.1 10*3/uL (ref 0.0–0.5)
Eosinophils Relative: 2 %
Immature Granulocytes: 0 %
Lymphocytes Relative: 11 %
Lymphs Abs: 0.8 10*3/uL (ref 0.7–4.0)
Monocytes Absolute: 0.7 10*3/uL (ref 0.1–1.0)
Monocytes Relative: 9 %
Neutro Abs: 5.8 10*3/uL (ref 1.7–7.7)
Neutrophils Relative %: 77 %

## 2018-06-22 LAB — CBC
HCT: 40.1 % (ref 36.0–46.0)
Hemoglobin: 13.2 g/dL (ref 12.0–15.0)
MCH: 30.9 pg (ref 26.0–34.0)
MCHC: 32.9 g/dL (ref 30.0–36.0)
MCV: 93.9 fL (ref 80.0–100.0)
Platelets: 208 10*3/uL (ref 150–400)
RBC: 4.27 MIL/uL (ref 3.87–5.11)
RDW: 14.5 % (ref 11.5–15.5)
WBC: 7.4 10*3/uL (ref 4.0–10.5)
nRBC: 0 % (ref 0.0–0.2)

## 2018-06-22 LAB — URINALYSIS, ROUTINE W REFLEX MICROSCOPIC
Bilirubin Urine: NEGATIVE
Glucose, UA: NEGATIVE mg/dL
Hgb urine dipstick: NEGATIVE
Ketones, ur: 20 mg/dL — AB
Leukocytes,Ua: NEGATIVE
Nitrite: NEGATIVE
Protein, ur: NEGATIVE mg/dL
Specific Gravity, Urine: 1.013 (ref 1.005–1.030)
pH: 7 (ref 5.0–8.0)

## 2018-06-22 LAB — ETHANOL: Alcohol, Ethyl (B): <10 mg/dL (ref <10)

## 2018-06-22 LAB — APTT: aPTT: 32 seconds (ref 24–36)

## 2018-06-22 LAB — CK: Total CK: 159 U/L (ref 38–234)

## 2018-06-22 LAB — PROTIME-INR
INR: 1.2 (ref 0.8–1.2)
Prothrombin Time: 15 seconds (ref 11.4–15.2)

## 2018-06-22 MED ORDER — LOSARTAN POTASSIUM 50 MG PO TABS
50.0000 mg | ORAL_TABLET | Freq: Every day | ORAL | Status: DC
  Administered 2018-06-22: 50 mg via ORAL
  Filled 2018-06-22: qty 1

## 2018-06-22 MED ORDER — RIVAROXABAN 15 MG PO TABS
15.0000 mg | ORAL_TABLET | Freq: Every day | ORAL | Status: DC
  Administered 2018-06-23: 15 mg via ORAL
  Filled 2018-06-22 (×2): qty 1

## 2018-06-22 NOTE — Care Management (Signed)
ED CM and ED CSW discussed with patient on a  safe transitional care plan, patient continues to decline SNF placement. Patient is agreeable to self pay for  Private Duty Nurses Aide to stay with her at home in addition to [facility] services.  Unable to arrange tonight will continue to work on this in the am. CM will leave a handoff. Updated Dr. Lynelle Doctor and Selena Batten RN on [place].

## 2018-06-22 NOTE — ED Notes (Signed)
Pt assisted x1 EMT for ambulation. Pt can bear weight on own. Pt is very unsteady. Reports the 'room is wavy' but not spinning. Can only take a few steps forward and back to bed.

## 2018-06-22 NOTE — ED Triage Notes (Signed)
Pt to ED via [facility] from home where she slipped out of chair and could not get up.  No known injury.  Pt lives at home alone.  Family reported to EMS that they wanted her transported for possible rehab placement.  Family also reports pt has had increased generalized weakness over past week.

## 2018-06-22 NOTE — Progress Notes (Signed)
CSW called pt who still refuses placement but is agreeable to paying for a round-the-clock personal care aide in the home and paying privately for this service.  TOC CM updated and will explain process for this with the pt at bedside.  CSW spoke to pt's sister who stated she could be payor for the pt in regards to paying the PCA once pt's discharges.    TOC RN CM updated.  CSW will continue to follow for D/C needs.  Dorothe Pea. Shimon Trowbridge, LCSW, LCAS, CSI Transitions of Care Clinical Social Worker Care Coordination Department Ph: [phone]

## 2018-06-22 NOTE — ED Notes (Signed)
This RN spoke with pt about informing pt's family of her medical condition. Pt agreeable, and wanted RN to speak with her Abbe Amsterdam. RN called and updated pt's sister. She was very thankful. Will continue to update as more info is available.

## 2018-06-22 NOTE — Progress Notes (Signed)
CSW spoke to EDP who states pt is medically cleared for D/C excepting for urine results which are unlikely to be a barrier for discharge.  CSW spoke to pt's sister Gales,Beartha at ph: [phone] and informed her of pt's wishes to return home and of the EDP's concerns for placement with the risk of infectious diseases in SNF's currently.  CSW also educated pt's sister of distinct possibility of pt's HTA insurance not paying for rehab due to no acute rehab-able injuriss and inquired if pt can private pay.    Pt's sister stated the pt could pay for s SNF, although she voiced udnerstanding that the pt is not agreeable to SNF att his time.  CSW suggested that if pt could pay for a SNF then she could likely pay for a PCA (personal care aide).  Pt's sister stated pt's sister had a husband at home with a HX of falls and pt's sister Mrs. Carmell Austria stated she could not care for the pt at home away from her husband.  CSW counseled pt's sister on risks of pt remaining in the ED during this times as well due to the pandemic and inquired as to pt/pt's sister's resources that would provide for assistance around the home.  Pt's sister stated it was the pt's "decision not mine so you would need to ask her (the pt).  CSW stated CSW would update the pt's sister and pt's sister was appreciative and thanked the CSW.  CSW will continue to follow for D/C needs.  Dorothe Pea. Jerad Dunlap, LCSW, LCAS, CSI Transitions of Care Clinical Social Worker Care Coordination Department Ph: [phone]

## 2018-06-22 NOTE — Care Management (Signed)
ED CM received consult from Dr. Tomi Bamberger concerning transitional care planning. Patient presented by EMS patient slipped out of chair and could not get up.  No known injury.  Pt lives at home alone.  Family reported to EMS that they wanted her transported for possible rehab placement.  Family also reports pt has had increased generalized weakness over past week. CM met with patient to discuss transitional care planning and  recommendation for [facility] services, patient is agreeable, patient declines Rehab Placement, offered choice [facility] selected.  Labs still pending.

## 2018-06-22 NOTE — ED Notes (Signed)
Dinner tray ordered for pt

## 2018-06-22 NOTE — ED Notes (Signed)
Pt resting, no complaints voiced

## 2018-06-22 NOTE — Progress Notes (Signed)
Consult request has been received. CSW attempting to follow up at present time.  CSW spoke to EDP and [facility] RN CM who state pt desires to return home and presents as A&OX4.    EDP states more test results are due soon.  CSW will continue to follow for D/C needs.  Ann Booth. Manpreet Strey, LCSW, LCAS, CSI Transitions of Care Clinical Social Worker Care Coordination Department Ph: [phone]

## 2018-06-22 NOTE — ED Notes (Signed)
Pt waiting for MRI.

## 2018-06-22 NOTE — ED Provider Notes (Signed)
[facility] EMERGENCY DEPARTMENT Provider Note   CSN: [id number] Arrival date & time: 06/22/18  1656    History   Chief Complaint Chief Complaint  Patient presents with   Fall    HPI Ann Booth is a 85 y.o. female.     HPI Patient presented to the ED for evaluation after she slipped out of her chair and was unable to get up.  Patient was at home when she slid out of her chair she was attempting to get up.  Patient was unable to stand on her own.  Patient did not injure herself but she was down on the floor for several hours.  Patient lives at home by herself.  Family eventually checked on the patient and found that she was on the floor.  They called EMS to transport her to the ED for possible rehab placement.  Patient has been gradually getting weaker over the past week.  Patient denies any difficulties with headache or chest pain.  No shortness of breath.  No vomiting or diarrhea.  No dysuria.  Patient states she would consider nursing home placement as well as assistance at home. Past Medical History:  Diagnosis Date   ALLERGIC RHINITIS    ANXIETY    ASTHMA    Atrial fibrillation (HCC)    BACK PAIN    Carotid stenosis 04/27/2012   Mild left on community screening  - Nov 2013   CATARACT NOS    DEPRESSION    GLAUCOMA NOS    HTN (hypertension)    Hypercalcemia    MITRAL VALVE PROLAPSE    OSTEOPOROSIS    PERIPHERAL NEUROPATHY    POSITIONAL VERTIGO    Rosacea     Patient Active Problem List   Diagnosis Date Noted   Recurrent falls while walking 06/17/2018   Urinary incontinence 06/17/2018   Chronic low back pain 06/17/2018   Thoracic spine pain 04/05/2018   Hyperglycemia 04/05/2018   Falls frequently 03/27/2018   Other fatigue 03/10/2018   Degenerative arthritis of left knee 06/25/2017   Peripheral vascular disease (HCC) 05/21/2017   Rib pain 04/24/2017   Chest pain 03/16/2017   Callus of foot 05/26/2016   Right  foot pain 03/21/2016   Allergic conjunctivitis 03/21/2016   Insomnia 07/28/2013   Arthritis of sacroiliac joint 03/30/2013   Leg length discrepancy 03/30/2013   N&V (nausea and vomiting) 01/27/2013   Atrial fibrillation (HCC) 11/01/2012   Chronic pain of left knee 10/08/2012   Conjunctivitis 07/21/2012   Carotid stenosis 04/27/2012   Chronic lower back pain 04/27/2012   Idiopathic scoliosis 04/27/2012   Black stools 10/25/2011   HTN (hypertension) 08/23/2010   Preventative health care 07/25/2010   Peripheral edema 07/25/2010   Hypercalcemia 11/09/2008   POSITIONAL VERTIGO 11/09/2008   BACK PAIN 11/09/2008   Peripheral neuropathic pain 06/01/2007   Allergic rhinitis 06/01/2007   SHOULDER PAIN, BILATERAL 06/01/2007   FATIGUE 06/01/2007   Hyperlipidemia 10/18/2006   Anxiety state 10/18/2006   Depression 10/18/2006   GLAUCOMA NOS 10/15/2006   CATARACT NOS 10/15/2006   Mitral valve disorder 10/15/2006   Asthma 10/15/2006   Rosacea 10/15/2006   Osteoporosis 10/15/2006    Past Surgical History:  Procedure Laterality Date    ankle surgury     left ankle   CATARACT EXTRACTION, BILATERAL     HEMORRHOID SURGERY     posterior chamber interocular lens implant       OB History   No obstetric history on file.  Home Medications    Prior to Admission medications   Medication Sig Start Date End Date Taking? Authorizing Provider  ALPRAZolam (XANAX) 0.5 MG tablet 1/2 - 1 tab by mouth at bedtime as needed Patient taking differently: Take 0.25-0.5 mg by mouth daily as needed for anxiety or sleep.  03/16/17   Corwin Levins, MD  b complex vitamins tablet Take 1 tablet by mouth daily.    [provider]  Biotin 1 MG CAPS Take 1 tablet by mouth daily.    [provider]  Calcium Carbonate-Vitamin D (CALCIUM + D PO) Take 1 tablet by mouth daily.     [provider]  cephALEXin (KEFLEX) 500 MG capsule Take 1 capsule (500  mg total) by mouth 3 (three) times daily for 10 days. 06/17/18 06/27/18  Corwin Levins, MD  Coenzyme Q10 (COQ-10) 100 MG CAPS Take 100 mg by mouth daily.     [provider]  furosemide (LASIX) 20 MG tablet TAKE 1 TABLET BY MOUTH ONCE DAILY AS NEEDED FOR SWELLING 03/29/18   Lewayne Bunting, MD  IRON, FERROUS GLUCONATE, PO Take 1 tablet by mouth every Monday, Wednesday, and Friday.     [provider]  lidocaine (LIDODERM) 5 % Place 1 patch onto the skin daily. Remove & Discard patch within 12 hours or as directed by MD 10/05/17   Corwin Levins, MD  losartan (COZAAR) 50 MG tablet Take 1 tablet (50 mg total) by mouth daily. 03/29/18   Lewayne Bunting, MD  LUTEIN PO Take 1 tablet by mouth daily.    [provider]  Magnesium Citrate 100 MG TABS Take 100 mg by mouth daily.     [provider]  Multiple Vitamins-Minerals (MACULAR VITAMIN BENEFIT PO) Take 2 tablets by mouth daily.    [provider]  Multiple Vitamins-Minerals (MULTIVITAMIN & MINERAL PO) Take 1 tablet by mouth daily.    [provider]  Rivaroxaban (XARELTO) 15 MG TABS tablet TAKE 1 TABLET BY MOUTH ONCE DAILY WITH SUPPER 12/28/17   Lewayne Bunting, MD  Thiamine HCl (B-1 PO) Take 1 tablet by mouth daily.     [provider]  timolol (BETIMOL) 0.5 % ophthalmic solution Place 1 drop into both eyes 2 (two) times daily.     [provider]  traMADol (ULTRAM) 50 MG tablet Take 1 tablet (50 mg total) by mouth every 6 (six) hours as needed. 06/17/18   Corwin Levins, MD  vitamin B-12 (CYANOCOBALAMIN) 1000 MCG tablet Take 1,000 mcg by mouth every other day.     [provider]    Family History Family History  Problem Relation Age of Onset   CAD Mother        MI at age 16   Cancer Sister        colon   Cancer Brother        prostate cancer    Social History Social History   Tobacco Use   Smoking status: Never Smoker   Smokeless tobacco: Never Used    Substance Use Topics   Alcohol use: No   Drug use: No     Allergies   Cymbalta [duloxetine hcl]   Review of Systems Review of Systems  All other systems reviewed and are negative.    Physical Exam Updated Vital Signs BP (!) 143/93    Pulse (!) 106    Temp 97.7 F (36.5 C) (Oral)    Resp 17  Ht 1.575 m (5\' 2" )    Wt 55.3 kg    SpO2 99%    BMI 22.31 kg/m   Physical Exam Vitals signs and nursing note reviewed.  Constitutional:      General: She is not in acute distress.    Appearance: She is well-developed.     Comments: Elderly, frail  HENT:     Head: Normocephalic and atraumatic.     Right Ear: External ear normal.     Left Ear: External ear normal.  Eyes:     General: No scleral icterus.       Right eye: No discharge.        Left eye: No discharge.     Conjunctiva/sclera: Conjunctivae normal.  Neck:     Musculoskeletal: Neck supple.     Trachea: No tracheal deviation.  Cardiovascular:     Rate and Rhythm: Normal rate and regular rhythm.  Pulmonary:     Effort: Pulmonary effort is normal. No respiratory distress.     Breath sounds: Normal breath sounds. No stridor. No wheezing or rales.  Abdominal:     General: Bowel sounds are normal. There is no distension.     Palpations: Abdomen is soft.     Tenderness: There is no abdominal tenderness. There is no guarding or rebound.  Musculoskeletal:        General: No tenderness.  Skin:    General: Skin is warm and dry.     Findings: No rash.  Neurological:     Cranial Nerves: No cranial nerve deficit (no facial droop, extraocular movements intact, no slurred speech).     Sensory: No sensory deficit.     Motor: No abnormal muscle tone or seizure activity.     Coordination: Coordination normal.     Comments: Patient has no facial droop,, normal speech, she is able to lift both arms off the bed without pronator drift, able to lift both legs off the bed normal sensation throughout      ED Treatments / Results   Labs (all labs ordered are listed, but only abnormal results are displayed) Labs Reviewed  COMPREHENSIVE METABOLIC PANEL - Abnormal; Notable for the following components:      Result Value   CO2 21 (*)    Glucose, Bld 103 (*)    Albumin 3.4 (*)    All other components within normal limits  URINALYSIS, ROUTINE W REFLEX MICROSCOPIC - Abnormal; Notable for the following components:   Ketones, ur 20 (*)    All other components within normal limits  ETHANOL  PROTIME-INR  APTT  CBC  DIFFERENTIAL  CK    EKG EKG Interpretation  Date/Time:  Tuesday June 22 2018 17:01:49 EDT Ventricular Rate:  94 PR Interval:    QRS Duration: 80 QT Interval:  360 QTC Calculation: 451 R Axis:   2 Text Interpretation:  Atrial fibrillation No significant change since last tracing Confirmed by Linwood DibblesKnapp, Marquez Ceesay [phone]([id number]) on 06/22/2018 5:12:06 PM   Radiology Ct Head Wo Contrast  Result Date: 06/22/2018 CLINICAL DATA:  Slipped out of a chair at home and could not get up, history hypertension, atrial fibrillation EXAM: CT HEAD WITHOUT CONTRAST TECHNIQUE: Contiguous axial images were obtained from the base of the skull through the vertex without intravenous contrast. Sagittal and coronal MPR images reconstructed from axial data set. COMPARISON:  06/11/2018 FINDINGS: Brain: Generalized atrophy. Normal ventricular morphology. No midline shift or mass effect. Small vessel chronic ischemic changes of deep cerebral white matter. No intracranial hemorrhage,  mass lesion, evidence of acute infarction, or extra-axial fluid collection. Vascular: Atherosclerotic calcifications within the internal carotid and vertebral arteries at skull base. 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 Skull: Intact Sinuses/Orbits: Clear Other: N/A IMPRESSION: Atrophy with small vessel chronic ischemic changes of deep cerebral white matter. No acute intracranial abnormalities. 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 Electronically Signed   By: Ulyses Southward M.D.   On: 06/22/2018 18:22   Dg Chest Portable 1 View  Result Date: 06/22/2018 CLINICAL DATA:  Weakness EXAM: PORTABLE CHEST 1 VIEW COMPARISON:  06/17/2018 FINDINGS: Heart size mildly enlarged. Negative for heart failure. Lungs are well aerated and clear. No acute abnormality no change from the prior study. IMPRESSION: No active disease. Electronically Signed   By: Marlan Palau M.D.   On: 06/22/2018 18:27    Procedures Procedures (including critical care time)  Medications Ordered in ED Medications  losartan (COZAAR) tablet 50 mg (50 mg Oral Given 06/22/18 1921)  Rivaroxaban (XARELTO) tablet 15 mg (has no administration in time range)     Initial Impression / Assessment and Plan / ED Course  I have reviewed the triage vital signs and the nursing notes.  Pertinent labs & imaging results that were available during my care of the patient were reviewed by me and considered in my medical decision making (see chart for details).  Clinical Course as of Jun 22 5  Tue Jun 22, 2018  2022 Patient was able to ambulate and bear weight.  Her laboratory tests have been reviewed.  No abnormalities noted.  X-ray and CT scan without acute findings.   [JK]  2022 Social worker has been contacted regarding possible placement   [JK]  2102 Reviewed CSW notes. Anticipate dc with home care assistance.  Stroke unlikely but with her balance issues and likely going home will proceed with MRI to rule out occult stroke   [JK]    Clinical Course User Index [JK] Linwood Dibbles, MD     Patient presented with recurrent falls.  Patient at her advanced age does live at home alone.  Her ED work-up is reassuring.  No cute abnormalities on her CT scan.  Labs are otherwise unremarkable.  No signs of acute infection.  MRI has been ordered and that is still pending.  If that is negative plan is for discharge home.  We will make sure the patient has   home health assistance before that is done.  Dr. Wilkie Aye will follow up on the MRI  Final Clinical Impressions(s) / ED Diagnoses   Final diagnoses:  Fall, initial encounter  Weakness    ED Discharge Orders         Ordered    Home Health     06/22/18 1955    Face-to-face encounter (required for Medicare/Medicaid patients)    Comments:  I Linwood Dibbles certify that this patient is under my care and that I, or a nurse practitioner or physician's assistant working with me, had a face-to-face encounter that meets the physician face-to-face encounter requirements with this patient on 06/22/2018. The encounter with the patient was in whole, or in part for the following medical condition(s) which is the primary reason for home health care (List medical condition): weakness, frequent falls   06/22/18 1955           Linwood Dibbles, MD 06/23/18 0007

## 2018-06-22 NOTE — ED Notes (Signed)
Pt eating dinner at this time

## 2018-06-22 NOTE — ED Notes (Signed)
Pure wick placed.

## 2018-06-23 ENCOUNTER — Telehealth: Payer: Self-pay | Admitting: Internal Medicine

## 2018-06-23 ENCOUNTER — Telehealth: Payer: Self-pay | Admitting: *Deleted

## 2018-06-23 DIAGNOSIS — R270 Ataxia, unspecified: Secondary | ICD-10-CM | POA: Diagnosis not present

## 2018-06-23 DIAGNOSIS — R296 Repeated falls: Secondary | ICD-10-CM

## 2018-06-23 DIAGNOSIS — W19XXXA Unspecified fall, initial encounter: Secondary | ICD-10-CM | POA: Diagnosis not present

## 2018-06-23 DIAGNOSIS — Z7401 Bed confinement status: Secondary | ICD-10-CM | POA: Diagnosis not present

## 2018-06-23 DIAGNOSIS — R5381 Other malaise: Secondary | ICD-10-CM | POA: Diagnosis not present

## 2018-06-23 DIAGNOSIS — M2550 Pain in unspecified joint: Secondary | ICD-10-CM | POA: Diagnosis not present

## 2018-06-23 NOTE — Telephone Encounter (Signed)
Ok, this is done 

## 2018-06-23 NOTE — Telephone Encounter (Signed)
Copied from CRM #[id number]. Topic: General - Other °>> Jun 23, 2018 12:41 PM Moore, Debra W wrote: °Reason for CRM: pt's great nephew Britt is calling and stated pt need an order for [facility] b/c pt had a fall and need some help. Please fax to [phone]. °

## 2018-06-23 NOTE — Discharge Instructions (Addendum)
Follow up with home health as described. It appears that you may have had a small stroke recently.  It is important that you continue her anticoagulant and follow-up with neurology.

## 2018-06-23 NOTE — ED Notes (Signed)
Ordered breakfast 

## 2018-06-23 NOTE — Telephone Encounter (Signed)
Copied from CRM [id number]. Topic: General - Other >> Jun 23, 2018 12:41 PM Doreatha Massed wrote: Reason for CRM: pt's great nephew Moshe Cipro is calling and stated pt need an order for [facility] b/c pt had a fall and need some help. Please fax to [phone].

## 2018-06-23 NOTE — ED Notes (Signed)
Nurse navigator spoke patient who wanted me to call her sister she is being discharged and transported home by [facility].

## 2018-06-23 NOTE — ED Notes (Signed)
[facility] called for pt 

## 2018-06-23 NOTE — ED Provider Notes (Signed)
Patient signed out pending MRI.  MRI does show a possible subacute stroke that is very small.  Patient is on Xarelto and reports compliance with medications.  This was discussed with neurology, Dr. Amada Jupiter.  Given that she is without signs and symptoms of stroke, this is likely not the cause of her frequent falls.  She is on appropriate anticoagulation and would likely not benefit from an admission for stroke work-up.  She does need to continue her anticoagulation and follow-up with neurology.  I discussed this with the patient.  She was arranged to have home health.  She lives by herself.  She does not feel comfortable going home in the middle of the night by herself.  We will hold until the morning.  7:25 AM Patient discharged to home.  Neurology follow-up provided.  Instructed to continue her anticoagulant.   Shon Baton, MD 06/23/18 [phone]

## 2018-06-23 NOTE — Telephone Encounter (Signed)
[facility] consulted regarding private care duty.  EDCM contacted Anibal Henderson, RN of [facility] to set up pt for services.

## 2018-06-23 NOTE — Addendum Note (Signed)
Addended by: Corwin Levins on: 06/23/2018 02:48 PM   Modules accepted: Orders

## 2018-06-24 NOTE — Telephone Encounter (Signed)
Faxed order to [facility].Marland KitchenRaechel Chute

## 2018-06-29 ENCOUNTER — Telehealth: Payer: Self-pay | Admitting: Internal Medicine

## 2018-06-29 NOTE — Telephone Encounter (Signed)
Copied from CRM [id number]. Topic: Quick Communication - See Telephone Encounter >> Jun 29, 2018  8:11 AM Louie Bun, Rosey Bath D wrote: CRM for notification. See Telephone encounter for: 06/29/18. Chales Abrahams with [facility] called and would like a primary diagnosis of the cause of patient falling and her weakness. Please call her back at [phone].

## 2018-06-29 NOTE — Telephone Encounter (Signed)
Chronic low back pain, and peripheral vascular disease

## 2018-06-29 NOTE — Telephone Encounter (Signed)
Dx given to Selena Batten at [facility].

## 2018-07-02 ENCOUNTER — Emergency Department (HOSPITAL_COMMUNITY): Payer: PPO

## 2018-07-02 ENCOUNTER — Encounter (HOSPITAL_COMMUNITY): Payer: Self-pay | Admitting: Emergency Medicine

## 2018-07-02 ENCOUNTER — Other Ambulatory Visit: Payer: Self-pay

## 2018-07-02 ENCOUNTER — Observation Stay (HOSPITAL_COMMUNITY)
Admission: EM | Admit: 2018-07-02 | Discharge: 2018-07-04 | Disposition: A | Discharge status: Home / Self Care | Payer: PPO | Attending: Internal Medicine | Admitting: Internal Medicine

## 2018-07-02 DIAGNOSIS — R739 Hyperglycemia, unspecified: Secondary | ICD-10-CM | POA: Insufficient documentation

## 2018-07-02 DIAGNOSIS — Z79899 Other long term (current) drug therapy: Secondary | ICD-10-CM | POA: Diagnosis not present

## 2018-07-02 DIAGNOSIS — J45909 Unspecified asthma, uncomplicated: Secondary | ICD-10-CM | POA: Insufficient documentation

## 2018-07-02 DIAGNOSIS — Z9181 History of falling: Secondary | ICD-10-CM | POA: Diagnosis not present

## 2018-07-02 DIAGNOSIS — R079 Chest pain, unspecified: Secondary | ICD-10-CM | POA: Diagnosis not present

## 2018-07-02 DIAGNOSIS — Z03818 Encounter for observation for suspected exposure to other biological agents ruled out: Secondary | ICD-10-CM | POA: Diagnosis not present

## 2018-07-02 DIAGNOSIS — E876 Hypokalemia: Secondary | ICD-10-CM | POA: Insufficient documentation

## 2018-07-02 DIAGNOSIS — H409 Unspecified glaucoma: Secondary | ICD-10-CM | POA: Diagnosis not present

## 2018-07-02 DIAGNOSIS — Z7901 Long term (current) use of anticoagulants: Secondary | ICD-10-CM | POA: Diagnosis not present

## 2018-07-02 DIAGNOSIS — I4891 Unspecified atrial fibrillation: Secondary | ICD-10-CM | POA: Diagnosis not present

## 2018-07-02 DIAGNOSIS — R296 Repeated falls: Secondary | ICD-10-CM

## 2018-07-02 DIAGNOSIS — I10 Essential (primary) hypertension: Secondary | ICD-10-CM | POA: Diagnosis present

## 2018-07-02 DIAGNOSIS — I4892 Unspecified atrial flutter: Secondary | ICD-10-CM | POA: Diagnosis not present

## 2018-07-02 DIAGNOSIS — Z1159 Encounter for screening for other viral diseases: Secondary | ICD-10-CM | POA: Insufficient documentation

## 2018-07-02 DIAGNOSIS — R000 Tachycardia, unspecified: Secondary | ICD-10-CM | POA: Diagnosis not present

## 2018-07-02 LAB — COMPREHENSIVE METABOLIC PANEL
ALT: 61 U/L — ABNORMAL HIGH (ref 0–44)
AST: 87 U/L — ABNORMAL HIGH (ref 15–41)
Albumin: 3 g/dL — ABNORMAL LOW (ref 3.5–5.0)
Alkaline Phosphatase: 124 U/L (ref 38–126)
Anion gap: 11 (ref 5–15)
BUN: 11 mg/dL (ref 8–23)
CO2: 24 mmol/L (ref 22–32)
Calcium: 8.8 mg/dL — ABNORMAL LOW (ref 8.9–10.3)
Chloride: 102 mmol/L (ref 98–111)
Creatinine, Ser: 0.86 mg/dL (ref 0.44–1.00)
GFR calc Af Amer: >60 mL/min (ref >60)
GFR calc non Af Amer: 59 mL/min — ABNORMAL LOW (ref >60)
Glucose, Bld: 122 mg/dL — ABNORMAL HIGH (ref 70–99)
Potassium: 3 mmol/L — ABNORMAL LOW (ref 3.5–5.1)
Sodium: 137 mmol/L (ref 135–145)
Total Bilirubin: 1.6 mg/dL — ABNORMAL HIGH (ref 0.3–1.2)
Total Protein: 6 g/dL — ABNORMAL LOW (ref 6.5–8.1)

## 2018-07-02 LAB — CBC
HCT: 35.6 % — ABNORMAL LOW (ref 36.0–46.0)
Hemoglobin: 11.8 g/dL — ABNORMAL LOW (ref 12.0–15.0)
MCH: 31.6 pg (ref 26.0–34.0)
MCHC: 33.1 g/dL (ref 30.0–36.0)
MCV: 95.2 fL (ref 80.0–100.0)
Platelets: 169 10*3/uL (ref 150–400)
RBC: 3.74 MIL/uL — ABNORMAL LOW (ref 3.87–5.11)
RDW: 15 % (ref 11.5–15.5)
WBC: 8.9 10*3/uL (ref 4.0–10.5)
nRBC: 0 % (ref 0.0–0.2)

## 2018-07-02 LAB — I-STAT TROPONIN, ED: Troponin i, poc: 0.01 ng/mL (ref 0.00–0.08)

## 2018-07-02 LAB — TROPONIN I
Troponin I: <0.03 ng/mL (ref <0.03)
Troponin I: <0.03 ng/mL (ref <0.03)

## 2018-07-02 LAB — MAGNESIUM: Magnesium: 1.9 mg/dL (ref 1.7–2.4)

## 2018-07-02 MED ORDER — FENTANYL CITRATE (PF) 100 MCG/2ML IJ SOLN
25.0000 ug | Freq: Once | INTRAMUSCULAR | Status: AC
  Administered 2018-07-02: 25 ug via INTRAVENOUS
  Filled 2018-07-02: qty 2

## 2018-07-02 MED ORDER — TIMOLOL HEMIHYDRATE 0.5 % OP SOLN: 1.0000 [drp] | Freq: Two times a day (BID) | OPHTHALMIC | Status: DC

## 2018-07-02 MED ORDER — DILTIAZEM LOAD VIA INFUSION
10.0000 mg | Freq: Once | INTRAVENOUS | Status: AC
  Administered 2018-07-02: 10 mg via INTRAVENOUS
  Filled 2018-07-02: qty 10

## 2018-07-02 MED ORDER — TRAMADOL HCL 50 MG PO TABS: 50.0000 mg | ORAL_TABLET | Freq: Four times a day (QID) | ORAL | Status: DC | PRN

## 2018-07-02 MED ORDER — ONDANSETRON HCL 4 MG/2ML IJ SOLN: 4.0000 mg | Freq: Four times a day (QID) | INTRAMUSCULAR | Status: DC | PRN

## 2018-07-02 MED ORDER — POTASSIUM CHLORIDE CRYS ER 20 MEQ PO TBCR
40.0000 meq | EXTENDED_RELEASE_TABLET | Freq: Once | ORAL | Status: AC
  Administered 2018-07-02: 40 meq via ORAL
  Filled 2018-07-02: qty 2

## 2018-07-02 MED ORDER — ACETAMINOPHEN 500 MG PO TABS
1000.0000 mg | ORAL_TABLET | Freq: Once | ORAL | Status: AC
  Administered 2018-07-02: 1000 mg via ORAL
  Filled 2018-07-02: qty 2

## 2018-07-02 MED ORDER — DILTIAZEM HCL-DEXTROSE 100-5 MG/100ML-% IV SOLN (PREMIX)
5.0000 mg/h | INTRAVENOUS | Status: DC
  Administered 2018-07-02: 7.5 mg/h via INTRAVENOUS
  Filled 2018-07-02 (×2): qty 100

## 2018-07-02 MED ORDER — ASPIRIN EC 81 MG PO TBEC
81.0000 mg | DELAYED_RELEASE_TABLET | Freq: Every day | ORAL | Status: DC
  Administered 2018-07-02 – 2018-07-04 (×3): 81 mg via ORAL
  Filled 2018-07-02 (×3): qty 1

## 2018-07-02 MED ORDER — RIVAROXABAN 15 MG PO TABS
15.0000 mg | ORAL_TABLET | Freq: Every day | ORAL | Status: DC
  Administered 2018-07-02 – 2018-07-04 (×3): 15 mg via ORAL
  Filled 2018-07-02 (×4): qty 1

## 2018-07-02 MED ORDER — DILTIAZEM HCL-DEXTROSE 100-5 MG/100ML-% IV SOLN (PREMIX)
5.0000 mg/h | INTRAVENOUS | Status: DC
  Administered 2018-07-02: 08:00:00 5 mg/h via INTRAVENOUS
  Filled 2018-07-02: qty 100

## 2018-07-02 MED ORDER — ALPRAZOLAM 0.25 MG PO TABS: 0.2500 mg | ORAL_TABLET | Freq: Every day | ORAL | Status: DC | PRN

## 2018-07-02 MED ORDER — ACETAMINOPHEN 325 MG PO TABS: 650.0000 mg | ORAL_TABLET | ORAL | Status: DC | PRN

## 2018-07-02 MED ORDER — TIMOLOL MALEATE 0.5 % OP SOLN
1.0000 [drp] | Freq: Two times a day (BID) | OPHTHALMIC | Status: DC
  Administered 2018-07-02 – 2018-07-04 (×5): 1 [drp] via OPHTHALMIC
  Filled 2018-07-02: qty 5

## 2018-07-02 MED ORDER — POTASSIUM CHLORIDE 10 MEQ/100ML IV SOLN
10.0000 meq | Freq: Once | INTRAVENOUS | Status: AC
  Administered 2018-07-02: 10 meq via INTRAVENOUS
  Filled 2018-07-02: qty 100

## 2018-07-02 MED ORDER — ALUM & MAG HYDROXIDE-SIMETH 200-200-20 MG/5ML PO SUSP
15.0000 mL | Freq: Four times a day (QID) | ORAL | Status: DC | PRN
  Administered 2018-07-02: 15 mL via ORAL
  Filled 2018-07-02: qty 30

## 2018-07-02 MED ORDER — SODIUM CHLORIDE 0.9 % IV BOLUS
1000.0000 mL | Freq: Once | INTRAVENOUS | Status: AC
  Administered 2018-07-02: 1000 mL via INTRAVENOUS

## 2018-07-02 NOTE — ED Provider Notes (Addendum)
[facility] EMERGENCY DEPARTMENT Provider Note  CSN: [id number] Arrival date & time: 07/02/18 0540  Chief Complaint(s) Chest Pain  HPI Ann Booth is a 85 y.o. female with extensive past medical history listed below including A. fib on Xarelto, recurrent falls, who presents to the emergency department for 8 hours of gradually worsening right-sided chest pain described as stabbing radiating to and from the back.  Initially began as mild pain and gradually worsened.  Exacerbated with movement and palpation.  Alleviated by certain positions.  Patient reported that she tried taking some antacids which did not help.  Denies any recent fevers or infections.  No coughing or congestion.  No nausea vomiting.  No associated diaphoresis or shortness of breath.  No abdominal pain.  Denies any other physical complaints at this time.  HPI  Past Medical History Past Medical History:  Diagnosis Date  . ALLERGIC RHINITIS   . ANXIETY   . ASTHMA   . Atrial fibrillation (HCC)   . BACK PAIN   . Carotid stenosis 04/27/2012   Mild left on community screening  - Nov 2013  . CATARACT NOS   . DEPRESSION   . GLAUCOMA NOS   . HTN (hypertension)   . Hypercalcemia   . MITRAL VALVE PROLAPSE   . OSTEOPOROSIS   . PERIPHERAL NEUROPATHY   . POSITIONAL VERTIGO   . Rosacea    Patient Active Problem List   Diagnosis Date Noted  . Recurrent falls while walking 06/17/2018  . Urinary incontinence 06/17/2018  . Chronic low back pain 06/17/2018  . Thoracic spine pain 04/05/2018  . Hyperglycemia 04/05/2018  . Falls frequently 03/27/2018  . Other fatigue 03/10/2018  . Degenerative arthritis of left knee 06/25/2017  . Peripheral vascular disease (HCC) 05/21/2017  . Rib pain 04/24/2017  . Chest pain 03/16/2017  . Callus of foot 05/26/2016  . Right foot pain 03/21/2016  . Allergic conjunctivitis 03/21/2016  . Insomnia 07/28/2013  . Arthritis of sacroiliac joint 03/30/2013  . Leg length  discrepancy 03/30/2013  . N&V (nausea and vomiting) 01/27/2013  . Atrial fibrillation (HCC) 11/01/2012  . Chronic pain of left knee 10/08/2012  . Conjunctivitis 07/21/2012  . Carotid stenosis 04/27/2012  . Chronic lower back pain 04/27/2012  . Idiopathic scoliosis 04/27/2012  . Black stools 10/25/2011  . HTN (hypertension) 08/23/2010  . Preventative health care 07/25/2010  . Peripheral edema 07/25/2010  . Hypercalcemia 11/09/2008  . POSITIONAL VERTIGO 11/09/2008  . BACK PAIN 11/09/2008  . Peripheral neuropathic pain 06/01/2007  . Allergic rhinitis 06/01/2007  . SHOULDER PAIN, BILATERAL 06/01/2007  . FATIGUE 06/01/2007  . Hyperlipidemia 10/18/2006  . Anxiety state 10/18/2006  . Depression 10/18/2006  . GLAUCOMA NOS 10/15/2006  . CATARACT NOS 10/15/2006  . Mitral valve disorder 10/15/2006  . Asthma 10/15/2006  . Rosacea 10/15/2006  . Osteoporosis 10/15/2006   Home Medication(s) Prior to Admission medications   Medication Sig Start Date End Date Taking? Authorizing Provider  ALPRAZolam (XANAX) 0.5 MG tablet 1/2 - 1 tab by mouth at bedtime as needed Patient taking differently: Take 0.25-0.5 mg by mouth daily as needed for anxiety or sleep.  03/16/17  Yes Corwin Levins, MD  furosemide (LASIX) 20 MG tablet TAKE 1 TABLET BY MOUTH ONCE DAILY AS NEEDED FOR SWELLING Patient taking differently: Take 20 mg by mouth daily as needed for fluid.  03/29/18  Yes Lewayne Bunting, MD  lidocaine (LIDODERM) 5 % Place 1 patch onto the skin daily. Remove & Discard patch  within 12 hours or as directed by MD Patient taking differently: Place 1 patch onto the skin daily as needed (pain). Remove & Discard patch within 12 hours or as directed by MD 10/05/17  Yes Corwin Levins, MD  losartan (COZAAR) 50 MG tablet Take 1 tablet (50 mg total) by mouth daily. 03/29/18  Yes Lewayne Bunting, MD  Rivaroxaban (XARELTO) 15 MG TABS tablet TAKE 1 TABLET BY MOUTH ONCE DAILY WITH SUPPER Patient taking differently: Take  15 mg by mouth daily.  12/28/17  Yes Lewayne Bunting, MD  timolol (BETIMOL) 0.5 % ophthalmic solution Place 1 drop into both eyes 2 (two) times daily.    Yes [provider]  traMADol (ULTRAM) 50 MG tablet Take 1 tablet (50 mg total) by mouth every 6 (six) hours as needed. Patient taking differently: Take 50 mg by mouth every 6 (six) hours as needed for moderate pain.  06/17/18  Yes Corwin Levins, MD                                                                                                                                    Past Surgical History Past Surgical History:  Procedure Laterality Date  .  ankle surgury     left ankle  . CATARACT EXTRACTION, BILATERAL    . HEMORRHOID SURGERY    . posterior chamber interocular lens implant     Family History Family History  Problem Relation Age of Onset  . CAD Mother        MI at age 73  . Cancer Sister        colon  . Cancer Brother        prostate cancer    Social History Social History   Tobacco Use  . Smoking status: Never Smoker  . Smokeless tobacco: Never Used  Substance Use Topics  . Alcohol use: No  . Drug use: No   Allergies Cymbalta [duloxetine hcl]  Review of Systems Review of Systems All other systems are reviewed and are negative for acute change except as noted in the HPI  Physical Exam Vital Signs  I have reviewed the triage vital signs BP (!) 132/105   Pulse (!) 109   Temp 98.7 F (37.1 C) (Oral)   Resp (!) 23   Ht 5\' 2"  (1.575 m)   Wt 55.3 kg   SpO2 98%   BMI 22.31 kg/m   Physical Exam Vitals signs reviewed.  Constitutional:      General: She is not in acute distress.    Appearance: She is well-developed. She is not diaphoretic.  HENT:     Head: Normocephalic and atraumatic.     Nose: Nose normal.  Eyes:     General: No scleral icterus.       Right eye: No discharge.        Left eye: No discharge.     Conjunctiva/sclera: Conjunctivae normal.  Pupils: Pupils are equal,  round, and reactive to light.  Neck:     Musculoskeletal: Normal range of motion and neck supple.  Cardiovascular:     Rate and Rhythm: Tachycardia present. Rhythm irregularly irregular.     Heart sounds: No murmur. No friction rub. No gallop.   Pulmonary:     Effort: Pulmonary effort is normal. No respiratory distress.     Breath sounds: Normal breath sounds. No stridor. No rales.    Chest:     Chest wall: Tenderness present.    Abdominal:     General: There is no distension.     Palpations: Abdomen is soft.     Tenderness: There is no abdominal tenderness.  Musculoskeletal:        General: No tenderness.  Skin:    General: Skin is warm and dry.     Findings: No erythema or rash.  Neurological:     Mental Status: She is alert and oriented to person, place, and time.     ED Results and Treatments Labs (all labs ordered are listed, but only abnormal results are displayed) Labs Reviewed  CBC - Abnormal; Notable for the following components:      Result Value   RBC 3.74 (*)    Hemoglobin 11.8 (*)    HCT 35.6 (*)    All other components within normal limits  COMPREHENSIVE METABOLIC PANEL - Abnormal; Notable for the following components:   Potassium 3.0 (*)    Glucose, Bld 122 (*)    Calcium 8.8 (*)    Total Protein 6.0 (*)    Albumin 3.0 (*)    AST 87 (*)    ALT 61 (*)    Total Bilirubin 1.6 (*)    GFR calc non Af Amer 59 (*)    All other components within normal limits  MAGNESIUM  I-STAT TROPONIN, ED                                                                                                                         EKG  EKG Interpretation  Date/Time:  Friday Jul 02 2018 05:52:36 EDT Ventricular Rate:  122 PR Interval:    QRS Duration: 83 QT Interval:  342 QTC Calculation: 488 R Axis:   -11 Text Interpretation:  Atrial fibrillation Ventricular premature complex Probable anteroseptal infarct, old Confirmed by Drema Pry [phone]) on 07/02/2018 5:55:12 AM       Radiology Dg Chest 2 View  Result Date: 07/02/2018 CLINICAL DATA:  Chest pain EXAM: CHEST - 2 VIEW COMPARISON:  06/22/2018 FINDINGS: Mild cardiac enlargement. Stable mediastinal contours. Interstitial coarsening without Kerley lines. No effusion or pneumothorax. IMPRESSION: Cardiomegaly and vascular congestion. Electronically Signed   By: Marnee Spring M.D.   On: 07/02/2018 06:39   Pertinent labs & imaging results that were available during my care of the patient were reviewed by me and considered in my medical decision making (see chart for details).  Medications Ordered in ED Medications  diltiazem (CARDIZEM) 1 mg/mL load via infusion 10 mg (has no administration in time range)    And  diltiazem (CARDIZEM) 100 mg in dextrose 5% 100mL (1 mg/mL) infusion (has no administration in time range)  fentaNYL (SUBLIMAZE) injection 25 mcg (has no administration in time range)  potassium chloride 10 mEq in 100 mL IVPB (has no administration in time range)  acetaminophen (TYLENOL) tablet 1,000 mg (1,000 mg Oral Given 07/02/18 0645)  sodium chloride 0.9 % bolus 1,000 mL (1,000 mLs Intravenous New Bag/Given 07/02/18 0646)                                                                                                                                    Procedures .Critical Care Performed by: Nira Connardama, Ukiah Trawick Eduardo, MD Authorized by: Nira Connardama, Isis Costanza Eduardo, MD     CRITICAL CARE Performed by: Amadeo GarnetPedro Eduardo Gulianna Hornsby Total critical care time: 30 minutes Critical care time was exclusive of separately billable procedures and treating other patients. Critical care was necessary to treat or prevent imminent or life-threatening deterioration. Critical care was time spent personally by me on the following activities: development of treatment plan with patient and/or surrogate as well as nursing, discussions with consultants, evaluation of patient's response to treatment, examination of patient, obtaining  history from patient or surrogate, ordering and performing treatments and interventions, ordering and review of laboratory studies, ordering and review of radiographic studies, pulse oximetry and re-evaluation of patient's condition.  (including critical care time)  Medical Decision Making / ED Course I have reviewed the nursing notes for this encounter and the patient's prior records (if available in EHR or on provided paperwork).    Patient presents with right-sided chest and shoulder girdle muscle pain which is extremely tender to palpation.  Pain was improved by repositioning.  Most suspicious for muscular strain/spasm of the right shoulder girdle muscles.  Will provide Tylenol for pain.  Low suspicion for ACS.  Doubt pulmonary embolism.  Doubt dissection.  Chest x-ray without evidence suggestive of pneumonia, pneumothorax, pneumomediastinum.  No abnormal contour of the mediastinum to suggest dissection. No evidence of acute injuries.  Patient noted to be in A. fib with RVR with rates from 110 to 140s. Trop negative. K+ at 3.0.  Patient is not on any rate control medicine.  Will start on Dilt. Will discuss admission with hospiralist    Final Clinical Impression(s) / ED Diagnoses Final diagnoses:  Chest pain  Atrial fibrillation with RVR (HCC)  Hypokalemia      This chart was dictated using voice recognition software.  Despite best efforts to proofread,  errors can occur which can change the documentation meaning.     Nira Connardama, Tomeka Kantner Eduardo, MD 07/02/18 [id number]

## 2018-07-02 NOTE — ED Triage Notes (Signed)
Pt coming by EMS after chest pain that developed yesterday evening. Pain radiates to back. 170/100 BP initally. Pt given 2 nitro for pain which helped some and now BP WDL. All other vitals WDL. Pt has hx of a fib and takes Eliquis for it

## 2018-07-02 NOTE — ED Notes (Signed)
ED TO INPATIENT HANDOFF REPORT  ED Nurse Name and Phone #: Idalia Needle, RN [phone]  S Name/Age/Gender Ann Booth 85 y.o. female Room/Bed: 020C/020C  Code Status   Code Status: Not on file  Home/SNF/Other Home Patient oriented to: self, place, time and situation Is this baseline? Yes   Triage Complete: Triage complete  Chief Complaint cp  Triage Note Pt coming by EMS after chest pain that developed yesterday evening. Pain radiates to back. 170/100 BP initally. Pt given 2 nitro for pain which helped some and now BP WDL. All other vitals WDL. Pt has hx of a fib and takes Eliquis for it   Allergies Allergies  Allergen Reactions  . Cymbalta [Duloxetine Hcl] Other (See Comments)    Level of Care/Admitting Diagnosis ED Disposition    ED Disposition Condition Comment   Admit  Hospital Area: [facility] [[id number]]  Level of Care: Progressive [102]  I expect the patient will be discharged within 24 hours: No (not a candidate for 5C-Observation unit)  Covid Evaluation: N/A  Diagnosis: Atrial fibrillation with RVR [facility]) [449201]  Admitting Physician: Jonah Blue [[id number]]  Attending Physician: Jonah Blue [[id number]]  PT Class (Do Not Modify): Observation [104]  PT Acc Code (Do Not Modify): Observation [10022]       B Medical/Surgery History Past Medical History:  Diagnosis Date  . ALLERGIC RHINITIS   . ANXIETY   . ASTHMA   . Atrial fibrillation (HCC)   . BACK PAIN   . Carotid stenosis 04/27/2012   Mild left on community screening  - Nov 2013  . CATARACT NOS   . DEPRESSION   . GLAUCOMA NOS   . HTN (hypertension)   . Hypercalcemia   . MITRAL VALVE PROLAPSE   . OSTEOPOROSIS   . PERIPHERAL NEUROPATHY   . POSITIONAL VERTIGO   . Rosacea    Past Surgical History:  Procedure Laterality Date  .  ankle surgury     left ankle  . CATARACT EXTRACTION, BILATERAL    . HEMORRHOID SURGERY    . posterior chamber interocular lens implant       A IV  Location/Drains/Wounds Patient Lines/Drains/Airways Status   Active Line/Drains/Airways    Name:   Placement date:   Placement time:   Site:   Days:   Peripheral IV 07/02/18 Right Wrist   07/02/18    -    Wrist   less than 1          Intake/Output Last 24 hours No intake or output data in the 24 hours ending 07/02/18 0846  Labs/Imaging Results for orders placed or performed during the hospital encounter of 07/02/18 (from the past 48 hour(s))  CBC     Status: Abnormal   Collection Time: 07/02/18  6:07 AM  Result Value Ref Range   WBC 8.9 4.0 - 10.5 K/uL   RBC 3.74 (L) 3.87 - 5.11 MIL/uL   Hemoglobin 11.8 (L) 12.0 - 15.0 g/dL   HCT 00.7 (L) 12.1 - 97.5 %   MCV 95.2 80.0 - 100.0 fL   MCH 31.6 26.0 - 34.0 pg   MCHC 33.1 30.0 - 36.0 g/dL   RDW 88.3 25.4 - 98.2 %   Platelets 169 150 - 400 K/uL   nRBC 0.0 0.0 - 0.2 %    Comment: Performed at [facility], [address] [place], [place] [address]  Comprehensive metabolic panel     Status: Abnormal   Collection Time: 07/02/18  6:07 AM  Result Value Ref Range   Sodium 137 135 - 145 mmol/L   Potassium 3.0 (L) 3.5 - 5.1 mmol/L   Chloride 102 98 - 111 mmol/L   CO2 24 22 - 32 mmol/L   Glucose, Bld 122 (H) 70 - 99 mg/dL   BUN 11 8 - 23 mg/dL   Creatinine, Ser 2.81 0.44 - 1.00 mg/dL   Calcium 8.8 (L) 8.9 - 10.3 mg/dL   Total Protein 6.0 (L) 6.5 - 8.1 g/dL   Albumin 3.0 (L) 3.5 - 5.0 g/dL   AST 87 (H) 15 - 41 U/L   ALT 61 (H) 0 - 44 U/L   Alkaline Phosphatase 124 38 - 126 U/L   Total Bilirubin 1.6 (H) 0.3 - 1.2 mg/dL   GFR calc non Af Amer 59 (L) >60 mL/min   GFR calc Af Amer >60 >60 mL/min   Anion gap 11 5 - 15    Comment: Performed at [facility], [address], [place], [place] [address]  I-stat troponin, ED     Status: None   Collection Time: 07/02/18  6:07 AM  Result Value Ref Range   Troponin i, poc 0.01 0.00 - 0.08 ng/mL   Comment 3            Comment: Due to the release kinetics of cTnI, a negative  result within the first hours of the onset of symptoms does not rule out myocardial infarction with certainty. If myocardial infarction is still suspected, repeat the test at appropriate intervals.   Magnesium     Status: None   Collection Time: 07/02/18  6:07 AM  Result Value Ref Range   Magnesium 1.9 1.7 - 2.4 mg/dL    Comment: Performed at [facility], [address] [place], [place] [address]   Dg Chest 2 View  Result Date: 07/02/2018 CLINICAL DATA:  Chest pain EXAM: CHEST - 2 VIEW COMPARISON:  06/22/2018 FINDINGS: Mild cardiac enlargement. Stable mediastinal contours. Interstitial coarsening without Kerley lines. No effusion or pneumothorax. IMPRESSION: Cardiomegaly and vascular congestion. Electronically Signed   By: Marnee Spring M.D.   On: 07/02/2018 06:39    Pending Labs Unresulted Labs (From admission, onward)   None      Vitals/Pain Today's Vitals   07/02/18 0800 07/02/18 0815 07/02/18 0830 07/02/18 0833  BP: (!) 130/103 (!) 138/95 129/86   Pulse: (!) 45 68 (!) 102   Resp: (!) 25 20 (!) 29   Temp:      TempSrc:      SpO2: 98% 98% 96%   Weight:      Height:      PainSc:    3     Isolation Precautions No active isolations  Medications Medications  diltiazem (CARDIZEM) 1 mg/mL load via infusion 10 mg (10 mg Intravenous Bolus from Bag 07/02/18 0829)    And  diltiazem (CARDIZEM) 100 mg in dextrose 5% (1 mg/mL) infusion (7.5 mg/hr Intravenous Rate/Dose Change 07/02/18 0843)  potassium chloride 10 mEq in 100 mL IVPB (10 mEq Intravenous New Bag/Given 07/02/18 0820)  acetaminophen (TYLENOL) tablet 1,000 mg (1,000 mg Oral Given 07/02/18 0645)  sodium chloride 0.9 % bolus 1,000 mL (0 mLs Intravenous Stopped 07/02/18 0823)  fentaNYL (SUBLIMAZE) injection 25 mcg (25 mcg Intravenous Given 07/02/18 0830)    Mobility walks with person assist Low fall risk   Focused Assessments Cardiac Assessment Handoff:  Cardiac Rhythm: Atrial fibrillation Lab Results   Component Value Date   CKTOTAL 159  06/22/2018   TROPONINI <0.03 10/23/2017   No results found for: DDIMER Does the Patient currently have chest pain? No     R Recommendations: See Admitting Provider Note  Report given to: 6E  Additional Notes:

## 2018-07-02 NOTE — H&P (Signed)
History and Physical    Ann Booth [id number]:[id number] DOB: [birth year] DOA: 07/02/2018  PCP: Corwin Levins, MD Consultants:  Jens Som - cardiology Patient coming from:  Home - lives with caregivers; NOK: Sister, [phone]  Chief Complaint: chest pain  HPI: Ann Booth is a 85 y.o. female with medical history significant of afib on Xarelto; HTN; hyperglycemia; and frequent falls presenting with chest pain.  She was sitting with her caregiver last night when she developed a pain in her right chest.  She took an antacid without improvement.  The pain just wouldn't go away.  It is still present somewhat.  It seems to be pleuritic in nature.  It radiates into her back.  +SOB chronically but currently not.  Medicine in the ER might have helped her chest pain.  She can feel her heart racing.     ED Course:  Afib with RVR.  Has a h/o intermittent afib, on Xarelto.  Presented with right-sided CP and shoulder pain - MSK-related.  She has frequent falls without significant injuries to date.  EMS gave NTG which improved BP but not CP.  HR uncontrolled so started on Dilt drip.  Review of Systems: As per HPI; otherwise review of systems reviewed and negative.   Ambulatory Status:  Ambulates with a cane  Past Medical History:  Diagnosis Date  . ALLERGIC RHINITIS   . ANXIETY   . ASTHMA   . Atrial fibrillation (HCC)   . BACK PAIN   . Carotid stenosis 04/27/2012   Mild left on community screening  - Nov 2013  . CATARACT NOS   . DEPRESSION   . GLAUCOMA NOS   . HTN (hypertension)   . Hypercalcemia   . MITRAL VALVE PROLAPSE   . OSTEOPOROSIS   . PERIPHERAL NEUROPATHY   . POSITIONAL VERTIGO   . Rosacea     Past Surgical History:  Procedure Laterality Date  .  ankle surgury     left ankle  . CATARACT EXTRACTION, BILATERAL    . HEMORRHOID SURGERY    . posterior chamber interocular lens implant      Social History   Socioeconomic History  . Marital status: Widowed    Spouse name: Not  on file  . Number of children: 0  . Years of education: Not on file  . Highest education level: Not on file  Occupational History  . Occupation: retired Control and instrumentation engineer: RETIRED  Social Needs  . Financial resource strain: Not hard at all  . Food insecurity:    Worry: Never true    Inability: Never true  . Transportation needs:    Medical: No    Non-medical: No  Tobacco Use  . Smoking status: Never Smoker  . Smokeless tobacco: Never Used  Substance and Sexual Activity  . Alcohol use: No  . Drug use: No  . Sexual activity: Never  Lifestyle  . Physical activity:    Days per week: 0 days    Minutes per session: 0 min  . Stress: Only a little  Relationships  . Social connections:    Talks on phone: More than three times a week    Gets together: More than three times a week    Attends religious service: More than 4 times per year    Active member of club or organization: Yes    Attends meetings of clubs or organizations: More than 4 times per year    Relationship status: Widowed  . Intimate  partner violence:    Fear of current or ex partner: Not on file    Emotionally abused: Not on file    Physically abused: Not on file    Forced sexual activity: Not on file  Other Topics Concern  . Not on file  Social History Narrative  . Not on file    Allergies  Allergen Reactions  . Cymbalta [Duloxetine Hcl] Other (See Comments)    Family History  Problem Relation Age of Onset  . CAD Mother        MI at age 90  . Cancer Sister        colon  . Cancer Brother        prostate cancer    Prior to Admission medications   Medication Sig Start Date End Date Taking? Authorizing Provider  ALPRAZolam (XANAX) 0.5 MG tablet 1/2 - 1 tab by mouth at bedtime as needed Patient taking differently: Take 0.25-0.5 mg by mouth daily as needed for anxiety or sleep.  03/16/17  Yes Corwin Levins, MD  furosemide (LASIX) 20 MG tablet TAKE 1 TABLET BY MOUTH ONCE DAILY AS NEEDED FOR SWELLING  Patient taking differently: Take 20 mg by mouth daily as needed for fluid.  03/29/18  Yes Lewayne Bunting, MD  lidocaine (LIDODERM) 5 % Place 1 patch onto the skin daily. Remove & Discard patch within 12 hours or as directed by MD Patient taking differently: Place 1 patch onto the skin daily as needed (pain). Remove & Discard patch within 12 hours or as directed by MD 10/05/17  Yes Corwin Levins, MD  losartan (COZAAR) 50 MG tablet Take 1 tablet (50 mg total) by mouth daily. 03/29/18  Yes Lewayne Bunting, MD  Rivaroxaban (XARELTO) 15 MG TABS tablet TAKE 1 TABLET BY MOUTH ONCE DAILY WITH SUPPER Patient taking differently: Take 15 mg by mouth daily.  12/28/17  Yes Lewayne Bunting, MD  timolol (BETIMOL) 0.5 % ophthalmic solution Place 1 drop into both eyes 2 (two) times daily.    Yes [provider]  traMADol (ULTRAM) 50 MG tablet Take 1 tablet (50 mg total) by mouth every 6 (six) hours as needed. Patient taking differently: Take 50 mg by mouth every 6 (six) hours as needed for moderate pain.  06/17/18  Yes Corwin Levins, MD    Physical Exam: Vitals:   07/02/18 0745 07/02/18 0800 07/02/18 0815 07/02/18 0830  BP: (!) 134/108 (!) 130/103 (!) 138/95 129/86  Pulse: (!) 52 (!) 45 68 (!) 102  Resp: 15 (!) 25 20 (!) 29  Temp:      TempSrc:      SpO2: 95% 98% 98% 96%  Weight:      Height:         . General:  Appears calm and comfortable and is NAD, mildly somnolent . Eyes:  PERRL, EOMI, normal lids, iris . ENT:  grossly normal hearing, lips & tongue, mmm . Neck:  no LAD, masses or thyromegaly . Cardiovascular:  Irregularly irregular with tachycardia to 120s, no m/r/g. No LE edema. +right-sided point tenderness to palpation. Marland Kitchen Respiratory:   CTA bilaterally with no wheezes/rales/rhonchi.  Normal respiratory effort. . Abdomen:  soft, NT, ND, NABS . Back:   normal alignment, no CVAT . Skin:  no rash or induration seen on limited exam . Musculoskeletal:  grossly normal tone BUE/BLE, good  ROM, no bony abnormality . Psychiatric:  grossly normal mood and affect, speech fluent and appropriate, AOx3 . Neurologic:  CN 2-12 grossly intact, moves all extremities in coordinated fashion, sensation intact    Radiological Exams on Admission: Dg Chest 2 View  Result Date: 07/02/2018 CLINICAL DATA:  Chest pain EXAM: CHEST - 2 VIEW COMPARISON:  06/22/2018 FINDINGS: Mild cardiac enlargement. Stable mediastinal contours. Interstitial coarsening without Kerley lines. No effusion or pneumothorax. IMPRESSION: Cardiomegaly and vascular congestion. Electronically Signed   By: Marnee Spring M.D.   On: 07/02/2018 06:39    EKG: Independently reviewed.  Afib with rate 122; nonspecific ST changes with no evidence of acute ischemia   Labs on Admission: I have personally reviewed the available labs and imaging studies at the time of the admission.  Pertinent labs:   K+ 3.0 Glucose 122 Albumin 3.0 AST 87/ALT 61/Bili 1.6 Troponin 0.01 UA: 20 ketones  Assessment/Plan Principal Problem:   Atrial fibrillation with RVR (HCC) Active Problems:   Unspecified glaucoma   HTN (hypertension)   Chest pain at rest   Falls frequently   Hyperglycemia   Afib with RVR   -Patient with known afib, presenting with chest pain (see below) and found to be in RVR.  -Will admit to SDU for Diltiazem drip as per protocol with plan to transition to PO Diltiazem once heart rate is controlled.   -Troponin q6h x 3 -Will give ASA 81 mg PO daily.   -Treat chest pain with NTG prn.   -Patient is on Xarelto at home, although she is at high risk for falls and may not be good candidate for anticoagulation.   Chest pain -Patient with right-sided pleuritic CP with TTP. -Initial cardiac troponin negative.  -EKG not indicative of acute ischemia.   -Low suspicion for ischemic disease currently. -Will plan to place in observation status on telemetry to rule out ACS by overnight observation.  -cycle troponin q6h x 3 and  repeat EKG in AM -Start ASA 81 mg  daily  HTN -Takes Cozaar monotherapy at home -Will hold Cozaar since she is currently on a Cardizem drip.  Hyperglycemia -Recent A1c was 6.0 -There is no indication to start medication  -She is unlikely to experience significant long-term effects of uncontrolled hyperglycemia  Falls frequently -Continue cane for assistance with ambulation -Consider whether fall risk is too considerable to safely allow for [facility] -Her HAS-BLED score appears to be 3, with a 3.74%/year bleeding risk  Hypokalemia -Likely related to the prn Lasix dose she reports taking for swelling  -She has no edema at this time, but has ketonuria (given 1L IVF in the ER) and hypokalemia as a result -Replete K+ - she was given 10 mEq IV in the ER and will give an additional 40 mEq PO x 1 now -Consider d/c of Lasix  Glaucoma -Continue Timolol    Note: This patient will been tested as a screening test only for the novel coronavirus COVID-19.   DVT prophylaxis:  Xarelto Code Status:  DNR - confirmed with patient Family Communication: None present Disposition Plan:  Home once clinically improved Consults called: None  Admission status: It is my clinical opinion that referral for OBSERVATION is reasonable and necessary in this patient based on the above information provided. The aforementioned taken together are felt to place the patient at high risk for further clinical deterioration. However it is anticipated that the patient may be medically stable for discharge from the hospital within 24 to 48 hours.   Jonah Blue MD [facility]   How to contact the [facility] Attending or Consulting provider 7A - 7P or  covering provider during after hours 7P -7A, for this patient?  1. Check the care team in [facility] and look for a) attending/consulting [facility] provider listed and b) the [facility] team listed 2. Log into [email or web address] and use [facility] universal password to access. If you do not have  the password, please contact the hospital operator. 3. Locate the [facility] provider you are looking for under [facility] and page to a number that you can be directly reached. 4. If you still have difficulty reaching the provider, please page the [facility] (Director on Call) for the Hospitalists listed on amion for assistance.   07/02/2018, 8:54 AM

## 2018-07-03 DIAGNOSIS — I10 Essential (primary) hypertension: Secondary | ICD-10-CM

## 2018-07-03 DIAGNOSIS — I4891 Unspecified atrial fibrillation: Secondary | ICD-10-CM | POA: Diagnosis not present

## 2018-07-03 DIAGNOSIS — R079 Chest pain, unspecified: Secondary | ICD-10-CM

## 2018-07-03 DIAGNOSIS — R296 Repeated falls: Secondary | ICD-10-CM | POA: Diagnosis not present

## 2018-07-03 LAB — CBC
HCT: 38.1 % (ref 36.0–46.0)
Hemoglobin: 12.6 g/dL (ref 12.0–15.0)
MCH: 30.9 pg (ref 26.0–34.0)
MCHC: 33.1 g/dL (ref 30.0–36.0)
MCV: 93.4 fL (ref 80.0–100.0)
Platelets: 201 10*3/uL (ref 150–400)
RBC: 4.08 MIL/uL (ref 3.87–5.11)
RDW: 15.3 % (ref 11.5–15.5)
WBC: 9.4 10*3/uL (ref 4.0–10.5)
nRBC: 0 % (ref 0.0–0.2)

## 2018-07-03 LAB — TROPONIN I: Troponin I: <0.03 ng/mL (ref <0.03)

## 2018-07-03 LAB — BASIC METABOLIC PANEL
Anion gap: 9 (ref 5–15)
BUN: 12 mg/dL (ref 8–23)
CO2: 23 mmol/L (ref 22–32)
Calcium: 9 mg/dL (ref 8.9–10.3)
Chloride: 105 mmol/L (ref 98–111)
Creatinine, Ser: 0.75 mg/dL (ref 0.44–1.00)
GFR calc Af Amer: >60 mL/min (ref >60)
GFR calc non Af Amer: >60 mL/min (ref >60)
Glucose, Bld: 110 mg/dL — ABNORMAL HIGH (ref 70–99)
Potassium: 3.8 mmol/L (ref 3.5–5.1)
Sodium: 137 mmol/L (ref 135–145)

## 2018-07-03 MED ORDER — SODIUM CHLORIDE 0.9% FLUSH
3.0000 mL | Freq: Two times a day (BID) | INTRAVENOUS | Status: DC
  Administered 2018-07-03: 3 mL via INTRAVENOUS

## 2018-07-03 MED ORDER — DILTIAZEM HCL 60 MG PO TABS
60.0000 mg | ORAL_TABLET | Freq: Three times a day (TID) | ORAL | Status: DC
  Administered 2018-07-03 – 2018-07-04 (×5): 60 mg via ORAL
  Filled 2018-07-03 (×5): qty 1

## 2018-07-03 MED ORDER — GUAIFENESIN 100 MG/5ML PO SOLN
10.0000 mL | ORAL | Status: DC | PRN
  Administered 2018-07-03: 200 mg via ORAL
  Filled 2018-07-03: qty 5

## 2018-07-03 NOTE — Care Management Obs Status (Signed)
MEDICARE OBSERVATION STATUS NOTIFICATION   Patient Details  Name: Ann Booth MRN: [id number] Date of Birth: 01/06/1927   Medicare Observation Status Notification Given:  Yes  Pt confused.  Discussed with sister, Katha Cabal, and mailed.   Deveron Furlong, RN 07/03/2018, 5:25 PM

## 2018-07-03 NOTE — Progress Notes (Addendum)
PROGRESS NOTE    Ann Booth  WNU:[id number] DOB: August 13, 1926 DOA: 07/02/2018 PCP: Corwin Levins, MD    Brief Narrative:   85 y.o. female with medical history significant of afib on Xarelto; HTN; hyperglycemia; and frequent falls presenting with chest pain.  She was sitting with her caregiver last night when she developed a pain in her right chest.  She took an antacid without improvement.  The pain just wouldn't go away.  It is still present somewhat.  It seems to be pleuritic in nature.  It radiates into her back.  +SOB chronically but currently not.  Medicine in the ER might have helped her chest pain.  She can feel her heart racing.   Assessment & Plan:   Principal Problem:   Atrial fibrillation with RVR (HCC) Active Problems:   Unspecified glaucoma   HTN (hypertension)   Chest pain at rest   Falls frequently   Hyperglycemia    Afib with RVR   -Patient with known afib, presenting with chest pain (see below) and found to be in RVR.  -continued overnight on cardizem gtt. Now rate controlled -Transition to PO cardizem -Troponin q6h x 3 negative -Treat chest pain with NTG prn.   -Patient is on Xarelto at home, although she is at high risk for falls and may not be good candidate for anticoagulation.   Chest pain -Patient with right-sided pleuritic CP with TTP. -Initial cardiac troponin negative. -EKG not indicative of acute ischemia.  -Low suspicion for ischemic disease currently. -trop neg x 3  HTN -Takes Cozaar monotherapy at home -Holding Cozaar since she is currently on Cardizem -BP stable  Hyperglycemia -Recent A1c was 6.0 -There is no indication to start medication  -She is unlikely to experience significant long-term effects of uncontrolled hyperglycemia -stable  Falls frequently -Continue cane for assistance with ambulation -Consider whether fall risk is too considerable to safely allow for [facility] -PT consulted  Hypokalemia -Likely related to the prn  Lasix dose she reports taking for swelling  -replaced -repeat bmet in AM  Glaucoma -Continue Timolol  DVT prophylaxis: Xarelto Code Status: DNR Family Communication: Pt in room, family not at bedside, updated patient's sister over phone Disposition Plan: Uncertain at this time  Consultants:     Procedures:     Antimicrobials: Anti-infectives (From admission, onward)   None       Subjective: Feeling tired this AM  Objective: Vitals:   07/02/18 2352 07/03/18 0500 07/03/18 0547 07/03/18 1354  BP: 126/80   119/78  Pulse:   81 84  Resp: 12   15  Temp:   97.9 F (36.6 C) (!) 97.4 F (36.3 C)  TempSrc:   Oral Oral  SpO2:   94% 95%  Weight:  56 kg    Height:        Intake/Output Summary (Last 24 hours) at 07/03/2018 1525 Last data filed at 07/03/2018 0913 Gross per 24 hour  Intake 0 ml  Output 400 ml  Net -400 ml   Filed Weights   07/02/18 0546 07/02/18 1108 07/03/18 0500  Weight: 55.3 kg 56.5 kg 56 kg    Examination:  General exam: Appears calm and comfortable  Respiratory system: Clear to auscultation. Respiratory effort normal. Cardiovascular system: S1 & S2 heard, RRR Gastrointestinal system: Abdomen is nondistended, soft and nontender. No organomegaly or masses felt. Normal bowel sounds heard. Central nervous system: arousable. No focal neurological deficits. Extremities: Symmetric 5 x 5 power. Skin: No rashes, lesions  Psychiatry:seems mildly  confused  Data Reviewed: I have personally reviewed following labs and imaging studies  CBC: Recent Labs  Lab 07/02/18 0607 07/03/18 0328  WBC 8.9 9.4  HGB 11.8* 12.6  HCT 35.6* 38.1  MCV 95.2 93.4  PLT 169 201   Basic Metabolic Panel: Recent Labs  Lab 07/02/18 0607 07/03/18 0328  NA 137 137  K 3.0* 3.8  CL 102 105  CO2 24 23  GLUCOSE 122* 110*  BUN 11 12  CREATININE 0.86 0.75  CALCIUM 8.8* 9.0  MG 1.9  --    GFR: Estimated Creatinine Clearance: 37 mL/min (by C-G formula based on SCr of  0.75 mg/dL). Liver Function Tests: Recent Labs  Lab 07/02/18 0607  AST 87*  ALT 61*  ALKPHOS 124  BILITOT 1.6*  PROT 6.0*  ALBUMIN 3.0*   No results for input(s): LIPASE, AMYLASE in the last 168 hours. No results for input(s): AMMONIA in the last 168 hours. Coagulation Profile: No results for input(s): INR, PROTIME in the last 168 hours. Cardiac Enzymes: Recent Labs  Lab 07/02/18 1105 07/02/18 1750 07/02/18 2327  TROPONINI <0.03 <0.03 <0.03   BNP (last 3 results) No results for input(s): PROBNP in the last 8760 hours. HbA1C: No results for input(s): HGBA1C in the last 72 hours. CBG: No results for input(s): GLUCAP in the last 168 hours. Lipid Profile: No results for input(s): CHOL, HDL, LDLCALC, TRIG, CHOLHDL, LDLDIRECT in the last 72 hours. Thyroid Function Tests: No results for input(s): TSH, T4TOTAL, FREET4, T3FREE, THYROIDAB in the last 72 hours. Anemia Panel: No results for input(s): VITAMINB12, FOLATE, FERRITIN, TIBC, IRON, RETICCTPCT in the last 72 hours. Sepsis Labs: No results for input(s): PROCALCITON, LATICACIDVEN in the last 168 hours.  No results found for this or any previous visit (from the past 240 hour(s)).   Radiology Studies: Dg Chest 2 View  Result Date: 07/02/2018 CLINICAL DATA:  Chest pain EXAM: CHEST - 2 VIEW COMPARISON:  06/22/2018 FINDINGS: Mild cardiac enlargement. Stable mediastinal contours. Interstitial coarsening without Kerley lines. No effusion or pneumothorax. IMPRESSION: Cardiomegaly and vascular congestion. Electronically Signed   By: Marnee Spring M.D.   On: 07/02/2018 06:39    Scheduled Meds: . aspirin EC  81 mg Oral Daily  . diltiazem  60 mg Oral Q8H  . Rivaroxaban  15 mg Oral Daily  . timolol  1 drop Both Eyes BID   Continuous Infusions: . diltiazem (CARDIZEM) infusion 7.5 mg/hr (07/03/18 0913)     LOS: 0 days   Rickey Barbara, MD [facility] Pager On Amion  If 7PM-7AM, please contact night-coverage 07/03/2018,  3:25 PM

## 2018-07-04 DIAGNOSIS — I10 Essential (primary) hypertension: Secondary | ICD-10-CM | POA: Diagnosis not present

## 2018-07-04 DIAGNOSIS — R079 Chest pain, unspecified: Secondary | ICD-10-CM | POA: Diagnosis not present

## 2018-07-04 DIAGNOSIS — R296 Repeated falls: Secondary | ICD-10-CM | POA: Diagnosis not present

## 2018-07-04 DIAGNOSIS — I4891 Unspecified atrial fibrillation: Secondary | ICD-10-CM | POA: Diagnosis not present

## 2018-07-04 LAB — SARS CORONAVIRUS 2 BY RT PCR (HOSPITAL ORDER, PERFORMED IN ~~LOC~~ HOSPITAL LAB): SARS Coronavirus 2: NEGATIVE

## 2018-07-04 MED ORDER — DILTIAZEM HCL 60 MG PO TABS: 60.0000 mg | ORAL_TABLET | Freq: Three times a day (TID) | ORAL | 0 refills | Status: DC

## 2018-07-04 NOTE — Discharge Summary (Signed)
Physician Discharge Summary  Ann Booth WUJ:[id number] DOB: Sep 04, 1926 DOA: 07/02/2018  PCP: Corwin Levins, MD  Admit date: 07/02/2018 Discharge date: 07/04/2018  Admitted From: ALF Disposition:  ALF  Recommendations for Outpatient Follow-up:  1. Follow up with PCP in 2-3 weeks 2. Follow up with Cardiology as scheduled  Home Health:PT/OT/RN/Aide  Equipment/Devices:Walker    Discharge Condition:Improved CODE STATUS:DNR Diet recommendation: Heart healthy   Brief/Interim Summary: 85 y.o.femalewith medical history significant ofafib on Xarelto; HTN; hyperglycemia; and frequent falls presenting with chest pain.She was sitting with her caregiver last night when she developed a pain in her right chest. She took an antacid without improvement. The pain just wouldn't go away. It is still present somewhat. It seems to be pleuritic in nature. It radiates into her back. +SOB chronically but currently not. Medicine in the ER might have helped her chest pain. She can feel her heart racing at time of presentation  Discharge Diagnoses:  Principal Problem:   Atrial fibrillation with RVR (HCC) Active Problems:   Unspecified glaucoma   HTN (hypertension)   Chest pain at rest   Falls frequently   Hyperglycemia  Afib with RVR -Patientwith known afib, presenting with chest pain (see below) and found to be in RVR. -continued initially on cardizem gtt. Now rate controlled -Transitioned to PO cardizem -Troponin q6h x 3 negative -Treat chest pain with NTG prn.  -Patient is onXareltoat home, although she is athigh risk for falls. Outpatient Cardiology notes reviewed. Per recent phone conversation with Cardiology in 3/20, patient noted to be forgetting to take Xarelto. Pt was recommended to resume anticoagulation at that time  Chest pain -Patient withright-sided pleuritic CP with TTP. -Initial cardiac troponin negative. -EKG not indicative of acute ischemia. -Low  suspicion for ischemic disease currently. -trop neg x 3  HTN -TakesCozaarmonotherapy at home -Holding Cozaar since she is currently on Cardizem -BP stable and well controlled  Hyperglycemia -Recent A1c was 6.0 -There is no indication to start medication -She is unlikely to experience significant long-term effects of uncontrolled hyperglycemia -stable  Falls frequently -Continue cane for assistance with ambulation -PT consulted with recommendation for home health PT. Orders placed  Hypokalemia -Likely related to the prn Lasix dose she reports taking for swelling  -replaced  Glaucoma -Continue Timolol   Discharge Instructions   Allergies as of 07/04/2018      Reactions   Cymbalta [duloxetine Hcl] Other (See Comments)      Medication List    STOP taking these medications   losartan 50 MG tablet Commonly known as:  COZAAR     TAKE these medications   ALPRAZolam 0.5 MG tablet Commonly known as:  Xanax 1/2 - 1 tab by mouth at bedtime as needed What changed:    how much to take  how to take this  when to take this  reasons to take this  additional instructions   diltiazem 60 MG tablet Commonly known as:  CARDIZEM Take 1 tablet (60 mg total) by mouth every 8 (eight) hours for 30 days.   furosemide 20 MG tablet Commonly known as:  LASIX TAKE 1 TABLET BY MOUTH ONCE DAILY AS NEEDED FOR SWELLING What changed:    how much to take  how to take this  when to take this  reasons to take this  additional instructions   lidocaine 5 % Commonly known as:  Lidoderm Place 1 patch onto the skin daily. Remove & Discard patch within 12 hours or as directed by MD What  changed:    when to take this  reasons to take this   Rivaroxaban 15 MG Tabs tablet Commonly known as:  Xarelto TAKE 1 TABLET BY MOUTH ONCE DAILY WITH SUPPER What changed:    how much to take  how to take this  when to take this  additional instructions   timolol 0.5 %  ophthalmic solution Commonly known as:  BETIMOL Place 1 drop into both eyes 2 (two) times daily.   traMADol 50 MG tablet Commonly known as:  ULTRAM Take 1 tablet (50 mg total) by mouth every 6 (six) hours as needed. What changed:  reasons to take this      Follow-up Information    Corwin Levins, MD. Schedule an appointment as soon as possible for a visit in 2 week(s).   Specialties:  Internal Medicine, Radiology Contact information: [address] [facility] [place] [address] [phone]        Lewayne Bunting, MD. Schedule an appointment as soon as possible for a visit.   Specialty:  Cardiology Contact information: [address] [place] [address] [phone]          Allergies  Allergen Reactions  . Cymbalta [Duloxetine Hcl] Other (See Comments)    Procedures/Studies: Dg Chest 2 View  Result Date: 07/02/2018 CLINICAL DATA:  Chest pain EXAM: CHEST - 2 VIEW COMPARISON:  06/22/2018 FINDINGS: Mild cardiac enlargement. Stable mediastinal contours. Interstitial coarsening without Kerley lines. No effusion or pneumothorax. IMPRESSION: Cardiomegaly and vascular congestion. Electronically Signed   By: Marnee Spring M.D.   On: 07/02/2018 06:39   Dg Chest 2 View  Result Date: 06/17/2018 CLINICAL DATA:  Hypertension. EXAM: CHEST - 2 VIEW COMPARISON:  Radiographs of December 15, 2017. FINDINGS: Stable cardiomegaly. No pneumothorax or pleural effusion is noted. Atherosclerosis of thoracic aorta is noted. Both lungs are clear. The visualized skeletal structures are unremarkable. IMPRESSION: No active cardiopulmonary disease. Aortic Atherosclerosis (ICD10-I70.0). Electronically Signed   By: Lupita Raider M.D.   On: 06/17/2018 16:49   Ct Head Booth Contrast  Result Date: 06/22/2018 CLINICAL DATA:  Slipped out of a chair at home and could not get up, history hypertension, atrial fibrillation EXAM: CT HEAD WITHOUT CONTRAST TECHNIQUE: Contiguous axial images were  obtained from the base of the skull through the vertex without intravenous contrast. Sagittal and coronal MPR images reconstructed from axial data set. COMPARISON:  06/11/2018 FINDINGS: Brain: Generalized atrophy. Normal ventricular morphology. No midline shift or mass effect. Small vessel chronic ischemic changes of deep cerebral white matter. No intracranial hemorrhage, mass lesion, evidence of acute infarction, or extra-axial fluid collection. Vascular: Atherosclerotic calcifications within the internal carotid and vertebral arteries at skull base. 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 Skull: Intact Sinuses/Orbits: Clear Other: N/A IMPRESSION: Atrophy with small vessel chronic ischemic changes of deep cerebral white matter. No acute intracranial abnormalities. 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 Electronically Signed   By: Ulyses Southward M.D.   On: 06/22/2018 18:22   Ct Head Booth Contrast  Result Date: 06/11/2018 CLINICAL DATA:  Fall. EXAM: CT HEAD WITHOUT CONTRAST CT CERVICAL SPINE WITHOUT CONTRAST TECHNIQUE: Multidetector CT imaging of the head and cervical spine was performed following the standard protocol without intravenous contrast. Multiplanar CT image reconstructions of the cervical spine were also generated. COMPARISON:  03/27/2018 head and cervical spine CTs. 04/21/2018 thoracic spine MRI. FINDINGS: CT HEAD FINDINGS Brain: There is no evidence of  acute infarct, intracranial hemorrhage, mass, midline shift, or extra-axial fluid collection. Cerebral atrophy is not greater than expected for age. Patchy cerebral white matter hypodensities are nonspecific but compatible with mild-to-moderate chronic small vessel ischemic disease. Vascular: Calcified atherosclerosis at the skull base. No hyperdense vessel. Skull: No fracture suspicious osseous lesion. Sinuses/Orbits: Mild posterior left ethmoid air cell mucosal thickening. Clear mastoid air  cells. Bilateral cataract extraction. Other: Small posterior scalp hematoma. CT CERVICAL SPINE FINDINGS Alignment: Unchanged trace anterolisthesis of C7 on T1. Skull base and vertebrae: T3 compression fracture with severe anterior vertebral body height loss, similar to the prior MRI. Nondisplaced posterior element fracture at T2 involving the inferior articular processes and lamina bilaterally, also present previously and with evidence of some interval healing although the fracture line remains visible. No acute fracture or suspicious osseous lesion. Soft tissues and spinal canal: No prevertebral fluid or swelling. No visible canal hematoma. Disc levels:  Moderate cervical disc and facet degeneration. Upper chest: Biapical pleuroparenchymal scarring. Other: Small bilateral thyroid nodules. IMPRESSION: 1. No evidence of acute intracranial abnormality. 2. Small posterior scalp hematoma. 3. No acute cervical spine fracture. 4. Subacute T3 compression fracture and posterior element fractures at T2. Electronically Signed   By: Sebastian Ache M.D.   On: 06/11/2018 18:34   Ct Cervical Spine Booth Contrast  Result Date: 06/11/2018 CLINICAL DATA:  Fall. EXAM: CT HEAD WITHOUT CONTRAST CT CERVICAL SPINE WITHOUT CONTRAST TECHNIQUE: Multidetector CT imaging of the head and cervical spine was performed following the standard protocol without intravenous contrast. Multiplanar CT image reconstructions of the cervical spine were also generated. COMPARISON:  03/27/2018 head and cervical spine CTs. 04/21/2018 thoracic spine MRI. FINDINGS: CT HEAD FINDINGS Brain: There is no evidence of acute infarct, intracranial hemorrhage, mass, midline shift, or extra-axial fluid collection. Cerebral atrophy is not greater than expected for age. Patchy cerebral white matter hypodensities are nonspecific but compatible with mild-to-moderate chronic small vessel ischemic disease. Vascular: Calcified atherosclerosis at the skull base. No hyperdense  vessel. Skull: No fracture suspicious osseous lesion. Sinuses/Orbits: Mild posterior left ethmoid air cell mucosal thickening. Clear mastoid air cells. Bilateral cataract extraction. Other: Small posterior scalp hematoma. CT CERVICAL SPINE FINDINGS Alignment: Unchanged trace anterolisthesis of C7 on T1. Skull base and vertebrae: T3 compression fracture with severe anterior vertebral body height loss, similar to the prior MRI. Nondisplaced posterior element fracture at T2 involving the inferior articular processes and lamina bilaterally, also present previously and with evidence of some interval healing although the fracture line remains visible. No acute fracture or suspicious osseous lesion. Soft tissues and spinal canal: No prevertebral fluid or swelling. No visible canal hematoma. Disc levels:  Moderate cervical disc and facet degeneration. Upper chest: Biapical pleuroparenchymal scarring. Other: Small bilateral thyroid nodules. IMPRESSION: 1. No evidence of acute intracranial abnormality. 2. Small posterior scalp hematoma. 3. No acute cervical spine fracture. 4. Subacute T3 compression fracture and posterior element fractures at T2. Electronically Signed   By: Sebastian Ache M.D.   On: 06/11/2018 18:34   Ann Booth Contrast  Result Date: 06/23/2018 CLINICAL DATA:  Initial evaluation for generalized weakness, ataxia. EXAM: MRI HEAD WITHOUT CONTRAST TECHNIQUE: Multiplanar, multiecho pulse sequences of the brain and surrounding structures were obtained without intravenous contrast. COMPARISON:  Comparison made with prior CT from 06/22/2018 FINDINGS: Brain: Diffuse prominence of the CSF containing spaces compatible with generalized age-related cerebral atrophy. Patchy and confluent T2/FLAIR hyperintensity within the periventricular and deep white matter both cerebral hemispheres most consistent with chronic small vessel  ischemic disease, moderate in nature. Subtle 4 mm focus of mild diffusion abnormality noted  within the right periatrial white matter (series 5, image 51), likely a small focus of subacute small vessel ischemia. No associated hemorrhage. No other diffusion abnormality to suggest acute or subacute infarct. Gray-white matter differentiation otherwise maintained. No encephalomalacia to suggest chronic cortical infarction. Single punctate focus of chronic microhemorrhage noted within the left cerebellum, of doubtful significance in isolation. No other evidence for acute or chronic intracranial hemorrhage. No mass lesion, midline shift or mass effect. Mild ventricular prominence related to global parenchymal volume loss without hydrocephalus. No extra-axial fluid collection. Pituitary gland suprasellar region normal. Midline structures intact. Vascular: Major intracranial vascular flow voids are well maintained. Strongly dominant left vertebral artery noted. Hypoplastic right vertebral artery likely terminates in PICA. Skull and upper cervical spine: Craniocervical junction within normal limits. Upper cervical spine normal. Bone marrow signal intensity within normal limits. No scalp soft tissue abnormality. Sinuses/Orbits: Patient status post bilateral ocular lens replacement. Globes and orbital soft tissues demonstrate no acute finding. Mild chronic mucosal thickening within the ethmoidal air cells and right sphenoid sinus. Paranasal sinuses are otherwise clear. Small left mastoid effusion, of doubtful significance. Inner ear structures grossly normal. Other: None. IMPRESSION: 1. Subtle 4 mm focus of mild diffusion abnormality involving the right periatrial white matter, likely a subacute small vessel type ischemic nonhemorrhagic infarct. 2. No other acute intracranial abnormality. 3. Age-related cerebral atrophy with moderate chronic small vessel ischemic disease. Electronically Signed   By: Rise Mu M.D.   On: 06/23/2018 00:22   Dg Chest Portable 1 View  Result Date: 06/22/2018 CLINICAL DATA:   Weakness EXAM: PORTABLE CHEST 1 VIEW COMPARISON:  06/17/2018 FINDINGS: Heart size mildly enlarged. Negative for heart failure. Lungs are well aerated and clear. No acute abnormality no change from the prior study. IMPRESSION: No active disease. Electronically Signed   By: Marlan Palau M.D.   On: 06/22/2018 18:27     Subjective: Eager to go home  Discharge Exam: Vitals:   07/04/18 0013 07/04/18 0641  BP: 109/60 110/72  Pulse:  75  Resp:  14  Temp:  98.9 F (37.2 C)  SpO2:  96%   Vitals:   07/04/18 0007 07/04/18 0013 07/04/18 0641 07/04/18 0653  BP: (!) 95/55 109/60 110/72   Pulse: 65  75   Resp: 18  14   Temp: 99.3 F (37.4 C)  98.9 F (37.2 C)   TempSrc: Axillary  Oral   SpO2: 98%  96%   Weight:    55.2 kg  Height:        General: Pt is alert, awake, not in acute distress Cardiovascular: RRR, S1/S2 +, no rubs, no gallops Respiratory: CTA bilaterally, no wheezing, no rhonchi Abdominal: Soft, NT, ND, bowel sounds + Extremities: no edema, no cyanosis   The results of significant diagnostics from this hospitalization (including imaging, microbiology, ancillary and laboratory) are listed below for reference.     Microbiology: Recent Results (from the past 240 hour(s))  SARS Coronavirus 2 (CEPHEID - Performed in [facility] hospital lab), Hosp Order     Status: None   Collection Time: 07/04/18  6:58 AM  Result Value Ref Range Status   SARS Coronavirus 2 NEGATIVE NEGATIVE Final    Comment: (NOTE) If result is NEGATIVE SARS-CoV-2 target nucleic acids are NOT DETECTED. The SARS-CoV-2 RNA is generally detectable in upper and lower  respiratory specimens during the acute phase of infection. The lowest  concentration of  SARS-CoV-2 viral copies this assay can detect is 250  copies / mL. A negative result does not preclude SARS-CoV-2 infection  and should not be used as the sole basis for treatment or other  patient management decisions.  A negative result may occur with   improper specimen collection / handling, submission of specimen other  than nasopharyngeal swab, presence of viral mutation(s) within the  areas targeted by this assay, and inadequate number of viral copies  (<250 copies / mL). A negative result must be combined with clinical  observations, patient history, and epidemiological information. If result is POSITIVE SARS-CoV-2 target nucleic acids are DETECTED. The SARS-CoV-2 RNA is generally detectable in upper and lower  respiratory specimens dur ing the acute phase of infection.  Positive  results are indicative of active infection with SARS-CoV-2.  Clinical  correlation with patient history and other diagnostic information is  necessary to determine patient infection status.  Positive results do  not rule out bacterial infection or co-infection with other viruses. If result is PRESUMPTIVE POSTIVE SARS-CoV-2 nucleic acids MAY BE PRESENT.   A presumptive positive result was obtained on the submitted specimen  and confirmed on repeat testing.  While 2019 novel coronavirus  (SARS-CoV-2) nucleic acids may be present in the submitted sample  additional confirmatory testing may be necessary for epidemiological  and / or clinical management purposes  to differentiate between  SARS-CoV-2 and other Sarbecovirus currently known to infect humans.  If clinically indicated additional testing with an alternate test  methodology [phone]) is advised. The SARS-CoV-2 RNA is generally  detectable in upper and lower respiratory sp ecimens during the acute  phase of infection. The expected result is Negative. Fact Sheet for Patients:  BoilerBrush.com.cy Fact Sheet for Healthcare Providers: https://pope.com/ This test is not yet approved or cleared by the [place] FDA and has been authorized for detection and/or diagnosis of SARS-CoV-2 by FDA under an Emergency Use Authorization (EUA).  This EUA will  remain in effect (meaning this test can be used) for the duration of the COVID-19 declaration under Section 564(b)(1) of the Act, 21 U.S.C. section 360bbb-3(b)(1), unless the authorization is terminated or revoked sooner. Performed at [facility], [address], [place], [place] [address]      Labs: BNP (last 3 results) No results for input(s): BNP in the last 8760 hours. Basic Metabolic Panel: Recent Labs  Lab 07/02/18 0607 07/03/18 0328  NA 137 137  K 3.0* 3.8  CL 102 105  CO2 24 23  GLUCOSE 122* 110*  BUN 11 12  CREATININE 0.86 0.75  CALCIUM 8.8* 9.0  MG 1.9  --    Liver Function Tests: Recent Labs  Lab 07/02/18 0607  AST 87*  ALT 61*  ALKPHOS 124  BILITOT 1.6*  PROT 6.0*  ALBUMIN 3.0*   No results for input(s): LIPASE, AMYLASE in the last 168 hours. No results for input(s): AMMONIA in the last 168 hours. CBC: Recent Labs  Lab 07/02/18 0607 07/03/18 0328  WBC 8.9 9.4  HGB 11.8* 12.6  HCT 35.6* 38.1  MCV 95.2 93.4  PLT 169 201   Cardiac Enzymes: Recent Labs  Lab 07/02/18 1105 07/02/18 1750 07/02/18 2327  TROPONINI <0.03 <0.03 <0.03   BNP: Invalid input(s): POCBNP CBG: No results for input(s): GLUCAP in the last 168 hours. D-Dimer No results for input(s): DDIMER in the last 72 hours. Hgb A1c No results for input(s): HGBA1C in the last 72 hours. Lipid Profile No results for input(s):  CHOL, HDL, LDLCALC, TRIG, CHOLHDL, LDLDIRECT in the last 72 hours. Thyroid function studies No results for input(s): TSH, T4TOTAL, T3FREE, THYROIDAB in the last 72 hours.  Invalid input(s): FREET3 Anemia work up No results for input(s): VITAMINB12, FOLATE, FERRITIN, TIBC, IRON, RETICCTPCT in the last 72 hours. Urinalysis    Component Value Date/Time   COLORURINE YELLOW 06/22/2018 1955   APPEARANCEUR CLEAR 06/22/2018 1955   LABSPEC 1.013 06/22/2018 1955   PHURINE 7.0 06/22/2018 1955   GLUCOSEU NEGATIVE 06/22/2018 1955   GLUCOSEU NEGATIVE  06/17/2018 1457   HGBUR NEGATIVE 06/22/2018 1955   BILIRUBINUR NEGATIVE 06/22/2018 1955   KETONESUR 20 (A) 06/22/2018 1955   PROTEINUR NEGATIVE 06/22/2018 1955   UROBILINOGEN 0.2 06/17/2018 1457   NITRITE NEGATIVE 06/22/2018 1955   LEUKOCYTESUR NEGATIVE 06/22/2018 1955   Sepsis Labs Invalid input(s): PROCALCITONIN,  WBC,  LACTICIDVEN Microbiology Recent Results (from the past 240 hour(s))  SARS Coronavirus 2 (CEPHEID - Performed in [facility] hospital lab), Hosp Order     Status: None   Collection Time: 07/04/18  6:58 AM  Result Value Ref Range Status   SARS Coronavirus 2 NEGATIVE NEGATIVE Final    Comment: (NOTE) If result is NEGATIVE SARS-CoV-2 target nucleic acids are NOT DETECTED. The SARS-CoV-2 RNA is generally detectable in upper and lower  respiratory specimens during the acute phase of infection. The lowest  concentration of SARS-CoV-2 viral copies this assay can detect is 250  copies / mL. A negative result does not preclude SARS-CoV-2 infection  and should not be used as the sole basis for treatment or other  patient management decisions.  A negative result may occur with  improper specimen collection / handling, submission of specimen other  than nasopharyngeal swab, presence of viral mutation(s) within the  areas targeted by this assay, and inadequate number of viral copies  (<250 copies / mL). A negative result must be combined with clinical  observations, patient history, and epidemiological information. If result is POSITIVE SARS-CoV-2 target nucleic acids are DETECTED. The SARS-CoV-2 RNA is generally detectable in upper and lower  respiratory specimens dur ing the acute phase of infection.  Positive  results are indicative of active infection with SARS-CoV-2.  Clinical  correlation with patient history and other diagnostic information is  necessary to determine patient infection status.  Positive results do  not rule out bacterial infection or co-infection with  other viruses. If result is PRESUMPTIVE POSTIVE SARS-CoV-2 nucleic acids MAY BE PRESENT.   A presumptive positive result was obtained on the submitted specimen  and confirmed on repeat testing.  While 2019 novel coronavirus  (SARS-CoV-2) nucleic acids may be present in the submitted sample  additional confirmatory testing may be necessary for epidemiological  and / or clinical management purposes  to differentiate between  SARS-CoV-2 and other Sarbecovirus currently known to infect humans.  If clinically indicated additional testing with an alternate test  methodology [phone]) is advised. The SARS-CoV-2 RNA is generally  detectable in upper and lower respiratory sp ecimens during the acute  phase of infection. The expected result is Negative. Fact Sheet for Patients:  BoilerBrush.com.cy Fact Sheet for Healthcare Providers: https://pope.com/ This test is not yet approved or cleared by the [place] FDA and has been authorized for detection and/or diagnosis of SARS-CoV-2 by FDA under an Emergency Use Authorization (EUA).  This EUA will remain in effect (meaning this test can be used) for the duration of the COVID-19 declaration under Section 564(b)(1) of the Act, 21 U.S.C. section 360bbb-3(b)(1),  unless the authorization is terminated or revoked sooner. Performed at [facility], [address] [place], [place] [address]    Time spent: 30 min  SIGNED:   Rickey Barbara, MD  [facility] 07/04/2018, 10:57 AM  If 7PM-7AM, please contact night-coverage

## 2018-07-04 NOTE — TOC Transition Note (Signed)
Transition of Care [facility]) - CM/SW Discharge Note   Patient Details  Name: Ann Booth MRN: [id number] Date of Birth: 03/12/1926  Transition of Care [facility]) CM/SW Contact:  Deveron Furlong, RN Phone Number: 07/04/2018, 11:26 AM   Clinical Narrative:    Pt to d/c home today.  Pt lives alone and has sister to assist.  Patient has 24/7 privately paid caregivers to assist patient that was arranged in the last month.  Pt has used home health in the past and thinks it was [facility].  Reviewed Medicare list with pt's sister.  She would like to use the agency previously used.  Pt has all necessary DME.   Referral called to [facility] with [facility]   Pt's caregiver will pick her up to take pt home.   Final next level of care: Home w Home Health Services Barriers to Discharge: No Barriers Identified   Patient Goals and CMS Choice Patient states their goals for this hospitalization and ongoing recovery are:: to go home CMS Medicare.gov Compare Post Acute Care list provided to:: Patient Represenative (must comment)(sister) Choice offered to / list presented to : Sibling  Discharge Plan and Services             DME Arranged: N/A DME Agency: NA       HH Arranged: RN, PT, OT, Nurse's Aide HH Agency: [facility] Date HH Agency Contacted: 07/04/18 Time HH Agency Contacted: 1125 Representative spoke with at [facility] Agency: Feliberto Gottron    Readmission Risk Interventions No flowsheet data found.

## 2018-07-04 NOTE — Evaluation (Signed)
Physical Therapy Evaluation Patient Details Name: Ann Booth MRN: [id number] DOB: 1926-08-01 Today's Date: 07/04/2018   History of Present Illness  Pt is a 85 y.o. F with significant PMH of afib on Xarelto, HTN, and frequent falls who presents with chest pain.  Clinical Impression  Pt admitted with above. Prior to admission, pt lives alone in a house with a ramped entrance; pt sister available to assist intermittently. She is independent with mobility using a cane, ADL's, and IADL's. Does have history of falls. On PT evaluation, pt ambulating 200 feet with walker and supervision. Denies chest pain, peak HR 96 bpm. Pt states she is fairly close to her baseline. Presents with decreased mobility secondary to postural abnormalities, weakness, and balance impairments. Does present as increased fall risk based on history of falls and decreased gait speed. Recommend HHPT to maximize functional independence.     Follow Up Recommendations Home health PT;Supervision for mobility/OOB    Equipment Recommendations  None recommended by PT (has walker)   Recommendations for Other Services OT consult     Precautions / Restrictions Precautions Precautions: Fall Restrictions Weight Bearing Restrictions: No      Mobility  Bed Mobility Overal bed mobility: Needs Assistance Bed Mobility: Supine to Sit     Supine to sit: Supervision     General bed mobility comments: Increased time  Transfers Overall transfer level: Needs assistance Equipment used: None Transfers: Sit to/from Stand Sit to Stand: Min assist         General transfer comment: light min assist due to initial posterior lean  Ambulation/Gait Ambulation/Gait assistance: Supervision Gait Distance (Feet): 200 Feet Assistive device: Rolling walker (2 wheeled) Gait Pattern/deviations: Step-through pattern;Trendelenburg;Decreased stride length;Trunk flexed Gait velocity: decreased Gait velocity interpretation: <1.8 ft/sec,  indicate of risk for recurrent falls General Gait Details: Trendelenberg gait noted with left lateral shift. Cues for walker proximity   Stairs            Wheelchair Mobility    Modified Rankin (Stroke Patients Only)       Balance Overall balance assessment: Needs assistance Sitting-balance support: Feet supported Sitting balance-Leahy Scale: Good     Standing balance support: No upper extremity supported;During functional activity Standing balance-Leahy Scale: Poor Standing balance comment: reliant on external support                             Pertinent Vitals/Pain Pain Assessment: No/denies pain    Home Living Family/patient expects to be discharged to:: Private residence Living Arrangements: Alone Available Help at Discharge: Family;Available PRN/intermittently(sister) Type of Home: House Home Access: Ramped entrance     Home Layout: One level Home Equipment: Cane - single point;Walker - 2 wheels      Prior Function Level of Independence: Independent with assistive device(s)         Comments: uses cane PTA. performs ADLs/IADLs without assist     Hand Dominance        Extremity/Trunk Assessment   Upper Extremity Assessment Upper Extremity Assessment: Generalized weakness    Lower Extremity Assessment Lower Extremity Assessment: Generalized weakness    Cervical / Trunk Assessment Cervical / Trunk Assessment: Other exceptions Cervical / Trunk Exceptions: scoliosis with left lateral shift  Communication   Communication: No difficulties  Cognition Arousal/Alertness: Awake/alert Behavior During Therapy: WFL for tasks assessed/performed Overall Cognitive Status: Impaired/Different from baseline Area of Impairment: Memory  Memory: Decreased short-term memory         General Comments: STM deficits noted      General Comments      Exercises     Assessment/Plan    PT Assessment Patient needs  continued PT services  PT Problem List Decreased strength;Decreased balance;Decreased mobility       PT Treatment Interventions DME instruction;Gait training;Functional mobility training;Therapeutic activities;Therapeutic exercise;Balance training;Patient/family education    PT Goals (Current goals can be found in the Care Plan section)  Acute Rehab PT Goals Patient Stated Goal: none stated; agreeable to home therapy PT Goal Formulation: With patient Time For Goal Achievement: 07/18/18 Potential to Achieve Goals: Good    Frequency Min 3X/week   Barriers to discharge        Co-evaluation               AM-PAC PT "6 Clicks" Mobility  Outcome Measure Help needed turning from your back to your side while in a flat bed without using bedrails?: None Help needed moving from lying on your back to sitting on the side of a flat bed without using bedrails?: None Help needed moving to and from a bed to a chair (including a wheelchair)?: None Help needed standing up from a chair using your arms (e.g., wheelchair or bedside chair)?: A Little Help needed to walk in hospital room?: None Help needed climbing 3-5 steps with a railing? : A Lot 6 Click Score: 21    End of Session Equipment Utilized During Treatment: Gait belt Activity Tolerance: Patient tolerated treatment well Patient left: in chair;with call bell/phone within reach;with chair alarm set Nurse Communication: Mobility status PT Visit Diagnosis: Unsteadiness on feet (R26.81);History of falling (Z91.81);Muscle weakness (generalized) (M62.81)    Time: 1779-3903 PT Time Calculation (min) (ACUTE ONLY): 27 min   Charges:   PT Evaluation $PT Eval Moderate Complexity: 1 Mod PT Treatments $Gait Training: 8-22 mins       Laurina Bustle, PT, DPT Acute Rehabilitation Services Pager [phone] Office [phone]   Vanetta Mulders 07/04/2018, 9:02 AM

## 2018-07-05 ENCOUNTER — Telehealth: Payer: Self-pay | Admitting: Gynecology

## 2018-07-05 NOTE — Telephone Encounter (Signed)
New Message:   Pt was discharged on yesterday. Her discharge summary said to follow up with Dr Jens Som sap. Pt is scheduled for this Friday, does it need to be sooner?

## 2018-07-05 NOTE — Telephone Encounter (Signed)
Spoke with pt's caregiver who report pt was discharged from the hospital yesterday and was instructed to follow up with Cardiologist as soon a possible. Pt has an appointment scheduled for this Friday 5/15. Caregiver questioning if pt need to make a sooner appointment than Friday and whether it need to be virtual or in office.   Will route to MD

## 2018-07-06 ENCOUNTER — Telehealth: Payer: Self-pay | Admitting: Internal Medicine

## 2018-07-06 DIAGNOSIS — R296 Repeated falls: Secondary | ICD-10-CM | POA: Diagnosis not present

## 2018-07-06 DIAGNOSIS — H409 Unspecified glaucoma: Secondary | ICD-10-CM | POA: Diagnosis not present

## 2018-07-06 DIAGNOSIS — F329 Major depressive disorder, single episode, unspecified: Secondary | ICD-10-CM | POA: Diagnosis not present

## 2018-07-06 DIAGNOSIS — Z7901 Long term (current) use of anticoagulants: Secondary | ICD-10-CM | POA: Diagnosis not present

## 2018-07-06 DIAGNOSIS — I6522 Occlusion and stenosis of left carotid artery: Secondary | ICD-10-CM | POA: Diagnosis not present

## 2018-07-06 DIAGNOSIS — J45909 Unspecified asthma, uncomplicated: Secondary | ICD-10-CM | POA: Diagnosis not present

## 2018-07-06 DIAGNOSIS — F419 Anxiety disorder, unspecified: Secondary | ICD-10-CM | POA: Diagnosis not present

## 2018-07-06 DIAGNOSIS — M810 Age-related osteoporosis without current pathological fracture: Secondary | ICD-10-CM | POA: Diagnosis not present

## 2018-07-06 DIAGNOSIS — I700 Atherosclerosis of aorta: Secondary | ICD-10-CM | POA: Diagnosis not present

## 2018-07-06 DIAGNOSIS — R0602 Shortness of breath: Secondary | ICD-10-CM | POA: Diagnosis not present

## 2018-07-06 DIAGNOSIS — M5020 Other cervical disc displacement, unspecified cervical region: Secondary | ICD-10-CM | POA: Diagnosis not present

## 2018-07-06 DIAGNOSIS — S22028D Other fracture of second thoracic vertebra, subsequent encounter for fracture with routine healing: Secondary | ICD-10-CM | POA: Diagnosis not present

## 2018-07-06 DIAGNOSIS — G629 Polyneuropathy, unspecified: Secondary | ICD-10-CM | POA: Diagnosis not present

## 2018-07-06 DIAGNOSIS — I341 Nonrheumatic mitral (valve) prolapse: Secondary | ICD-10-CM | POA: Diagnosis not present

## 2018-07-06 DIAGNOSIS — R739 Hyperglycemia, unspecified: Secondary | ICD-10-CM | POA: Diagnosis not present

## 2018-07-06 DIAGNOSIS — I726 Aneurysm of vertebral artery: Secondary | ICD-10-CM | POA: Diagnosis not present

## 2018-07-06 DIAGNOSIS — I4891 Unspecified atrial fibrillation: Secondary | ICD-10-CM | POA: Diagnosis not present

## 2018-07-06 DIAGNOSIS — R071 Chest pain on breathing: Secondary | ICD-10-CM | POA: Diagnosis not present

## 2018-07-06 DIAGNOSIS — S22030D Wedge compression fracture of third thoracic vertebra, subsequent encounter for fracture with routine healing: Secondary | ICD-10-CM | POA: Diagnosis not present

## 2018-07-06 DIAGNOSIS — I119 Hypertensive heart disease without heart failure: Secondary | ICD-10-CM | POA: Diagnosis not present

## 2018-07-06 NOTE — Telephone Encounter (Signed)
Spoke with pt caregiver, Aware of dr Ludwig Clarks recommendations. Visit changed to video.

## 2018-07-06 NOTE — Telephone Encounter (Signed)
Friday okay for virtual office visit. Ann Booth

## 2018-07-06 NOTE — Telephone Encounter (Signed)
Ideally, we need In person OV, but virtual would be ok; I would not ask for phone f/u in this case

## 2018-07-06 NOTE — Telephone Encounter (Signed)
Patient's sister called to schedule a Hospital Follow Up for the patient.  Would you like this in office or scheduled as a virtual or phone call visit?

## 2018-07-07 ENCOUNTER — Telehealth: Payer: Self-pay

## 2018-07-07 ENCOUNTER — Telehealth: Payer: Self-pay | Admitting: Internal Medicine

## 2018-07-07 ENCOUNTER — Ambulatory Visit (INDEPENDENT_AMBULATORY_CARE_PROVIDER_SITE_OTHER): Payer: PPO | Admitting: Internal Medicine

## 2018-07-07 DIAGNOSIS — F411 Generalized anxiety disorder: Secondary | ICD-10-CM

## 2018-07-07 DIAGNOSIS — I4891 Unspecified atrial fibrillation: Secondary | ICD-10-CM

## 2018-07-07 DIAGNOSIS — I10 Essential (primary) hypertension: Secondary | ICD-10-CM | POA: Diagnosis not present

## 2018-07-07 NOTE — Telephone Encounter (Signed)
Ann Booth has been given verbal orders.    Copied from CRM [id number]. Topic: Quick Communication - Home Health Verbal Orders >> Jul 07, 2018  8:48 AM Percival Spanish wrote: [name] with [facility]   Callback Number [phone]  Requesting verbal OT                           Frequency 2 x 4

## 2018-07-07 NOTE — Telephone Encounter (Signed)
Noted  

## 2018-07-07 NOTE — Telephone Encounter (Signed)
Copied from CRM [id number]. Topic: Appointment Scheduling - Scheduling Inquiry for Clinic >> Jul 06, 2018  4:37 PM Jay Schlichter wrote: Reason for CRM: sister called, she is wanting to set up hosp follow up. Please call [phone]

## 2018-07-07 NOTE — Telephone Encounter (Signed)
Virtual visit scheduled. The patient's caregiver in the home will help her get on and set up for the virtual visit.

## 2018-07-07 NOTE — Patient Instructions (Addendum)
OK to change the xanax to taking 1/2 pill (0.25 mg) twice per day as needed for nerves  OK to change the diltiazem 60 mh every 8 hrs to 1/2 pill (30 mg) four times per day until used up, then take Diltiazem XL 120 mg only once per day  Please consider purchase of BP monitor and O2 sat monitor for home use  Please continue all other medications as before, and refills have been done if requested.  Please have the pharmacy call with any other refills you may need  Please keep your appointments with your specialists as you may have planned  Please return in 3 months, or sooner if needed

## 2018-07-07 NOTE — Telephone Encounter (Signed)
Left vm to call back

## 2018-07-07 NOTE — Progress Notes (Addendum)
Patient ID: Ann Booth, female   DOB: [birth year], 85 y.o.   MRN: [id number]  Virtual Visit via Video Note but phone only due to failed video  Total time 19 minutes  I connected with Josem Kaufmann on 07/07/18 at  1:00 PM EDT by a video enabled telemedicine application and verified that I am speaking with the correct person using two identifiers.  Location: Patient: at home Provider: at office   I discussed the limitations of evaluation and management by telemedicine and the availability of in person appointments. The patient expressed understanding and agreed to proceed.  History of Present Illness: Here for f/u 3 days post hospn 5/8 - 5/10 with afib/RVR, HTN, anxiety now back to ALF, c/o generalized weakness and BP 108/66 this am, slow and walks with cane, no falls.  Pt denies chest pain, increased sob or doe, wheezing, orthopnea, PND, increased LE swelling, palpitations, dizziness or syncope.  Was d/c on difficult to take diltiazem 60 qid.  HH has already been present with RN and to start PT very soon.   Pt denies fever, wt loss, night sweats, loss of appetite, or other constitutional symptoms  Denies worsening depressive symptoms, suicidal ideation, or panic; has ongoing anxiety, some worse recently, asking for increased tx.   Past Medical History:  Diagnosis Date  . ALLERGIC RHINITIS   . ANXIETY   . ASTHMA   . Atrial fibrillation (HCC)   . BACK PAIN   . Carotid stenosis 04/27/2012   Mild left on community screening  - Nov 2013  . CATARACT NOS   . DEPRESSION   . GLAUCOMA NOS   . HTN (hypertension)   . Hypercalcemia   . MITRAL VALVE PROLAPSE   . OSTEOPOROSIS   . PERIPHERAL NEUROPATHY   . POSITIONAL VERTIGO   . Rosacea    Past Surgical History:  Procedure Laterality Date  .  ankle surgury     left ankle  . CATARACT EXTRACTION, BILATERAL    . HEMORRHOID SURGERY    . posterior chamber interocular lens implant      reports that she has never smoked. She has never used  smokeless tobacco. She reports that she does not drink alcohol or use drugs. family history includes CAD in her mother; Cancer in her brother and sister. Allergies  Allergen Reactions  . Cymbalta [Duloxetine Hcl] Other (See Comments)   Current Outpatient Medications on File Prior to Visit  Medication Sig Dispense Refill  . ALPRAZolam (XANAX) 0.5 MG tablet 1/2 - 1 tab by mouth at bedtime as needed (Patient taking differently: Take 0.25-0.5 mg by mouth 2 (two) times daily. ) 90 tablet 1  . diltiazem (CARDIZEM) 60 MG tablet Take 1 tablet (60 mg total) by mouth every 8 (eight) hours for 30 days. (Patient taking differently: Take 60 mg by mouth every 8 (eight) hours. Pt was ordered to break tab in half and take 4 times a day) 90 tablet 0  . furosemide (LASIX) 20 MG tablet TAKE 1 TABLET BY MOUTH ONCE DAILY AS NEEDED FOR SWELLING (Patient taking differently: Take 20 mg by mouth daily as needed for fluid. ) 90 tablet 3  . lidocaine (LIDODERM) 5 % Place 1 patch onto the skin daily. Remove & Discard patch within 12 hours or as directed by MD (Patient not taking: Reported on 07/09/2018) 60 patch 1  . Rivaroxaban (XARELTO) 15 MG TABS tablet TAKE 1 TABLET BY MOUTH ONCE DAILY WITH SUPPER (Patient taking differently: Take 15 mg by mouth  daily. ) 90 tablet 1  . timolol (BETIMOL) 0.5 % ophthalmic solution Place 1 drop into both eyes 2 (two) times daily.     . traMADol (ULTRAM) 50 MG tablet Take 1 tablet (50 mg total) by mouth every 6 (six) hours as needed. (Patient not taking: Reported on 07/09/2018) 120 tablet 2   No current facility-administered medications on file prior to visit.     Observations/Objective: Unable due to phone only Lab Results  Component Value Date   WBC 9.4 07/03/2018   HGB 12.6 07/03/2018   HCT 38.1 07/03/2018   PLT 201 07/03/2018   GLUCOSE 110 (H) 07/03/2018   CHOL 181 03/16/2017   TRIG 122.0 03/16/2017   HDL 78.90 03/16/2017   LDLCALC 77 03/16/2017   ALT 61 (H) 07/02/2018   AST  87 (H) 07/02/2018   NA 137 07/03/2018   K 3.8 07/03/2018   CL 105 07/03/2018   CREATININE 0.75 07/03/2018   BUN 12 07/03/2018   CO2 23 07/03/2018   TSH 0.55 06/17/2018   INR 1.2 06/22/2018   HGBA1C 6.0 06/17/2018   Assessment and Plan: See notes  Follow Up Instructions: See notes   I discussed the assessment and treatment plan with the patient. The patient was provided an opportunity to ask questions and all were answered. The patient agreed with the plan and demonstrated an understanding of the instructions.   The patient was advised to call back or seek an in-person evaluation if the symptoms worsen or if the condition fails to improve as anticipated.   Oliver Barre, MD

## 2018-07-07 NOTE — Telephone Encounter (Deleted)
Copied from CRM [id number]. Topic: Quick Communication - Home Health Verbal Orders >> Jul 07, 2018  8:48 AM Percival Spanish wrote: Caller/Agency: *** Callback Number: *** Requesting OT/PT/Skilled Nursing/Social Work/Speech Therapy: *** Frequency: ***

## 2018-07-08 ENCOUNTER — Telehealth: Payer: Self-pay | Admitting: Cardiology

## 2018-07-08 ENCOUNTER — Telehealth: Payer: Self-pay

## 2018-07-08 NOTE — Telephone Encounter (Signed)
Mychart pending, smartphone, consent, pre reg complete 07/08/18 AF

## 2018-07-08 NOTE — Telephone Encounter (Signed)
Verbal orders given to [place].    Copied from CRM [id number]. Topic: Quick Communication - Home Health Verbal Orders >> Jul 08, 2018  9:25 AM Reggie Pile, [place] wrote: Caller/Agency: Marylynn Pearson Lucas/ [facility]  Callback Number: [phone] and it is okay to leave a VM Requesting OT/PT/Skilled Nursing/Social Work/Speech Therapy: PT Frequency: one week one, one every other week two, one week two, one every other week four.

## 2018-07-08 NOTE — Progress Notes (Signed)
Virtual Visit via Video Note   This visit type was conducted due to national recommendations for restrictions regarding the COVID-19 Pandemic (e.g. social distancing) in an effort to limit this patient's exposure and mitigate transmission in our community.  Due to her co-morbid illnesses, this patient is at least at moderate risk for complications without adequate follow up.  This format is felt to be most appropriate for this patient at this time.  All issues noted in this document were discussed and addressed.  A limited physical exam was performed with this format.  Please refer to the patient's chart for her consent to telehealth for [facility].   Date:  07/09/2018   ID:  Ann Booth, DOB 1926-10-21, MRN [id number]  Patient Location: Home Provider Location: Home  PCP:  Corwin Levins, MD  Cardiologist:  Dr Jens Som  Evaluation Performed:  Follow-Up Visit  Chief Complaint:  FU atrial fibrillation  History of Present Illness:    FU atrial fibrillation. Monitor in August of 2014 showed PAF. Toprol DCed previously due to bradycardia. Last echocardiogram November 2019 showed normal LV function, prolapse of anterior mitral valve leaflet with moderate mitral regurgitation, severe left atrial enlargement, moderate to severe tricuspid regurgitation.   Patient has had multiple falls recently.  She was seen in the emergency room May 2020 with chest pain that was atypical.  Enzymes negative.  Brain MRI April 2020 showed subtle 4 mm focus of mild diffusion abnormality in the right periatrial white matter likely subacute small ischemic infarct.  Since I last saw her,she has some orthopnea and cough with lying flat.  No chest pain, palpitations or syncope.  Some pedal edema which is improving with low-dose Lasix.  The patient does not have symptoms concerning for COVID-19 infection (fever, chills, cough, or new shortness of breath).    Past Medical History:  Diagnosis Date  . ALLERGIC  RHINITIS   . ANXIETY   . ASTHMA   . Atrial fibrillation (HCC)   . BACK PAIN   . Carotid stenosis 04/27/2012   Mild left on community screening  - Nov 2013  . CATARACT NOS   . DEPRESSION   . GLAUCOMA NOS   . HTN (hypertension)   . Hypercalcemia   . MITRAL VALVE PROLAPSE   . OSTEOPOROSIS   . PERIPHERAL NEUROPATHY   . POSITIONAL VERTIGO   . Rosacea    Past Surgical History:  Procedure Laterality Date  .  ankle surgury     left ankle  . CATARACT EXTRACTION, BILATERAL    . HEMORRHOID SURGERY    . posterior chamber interocular lens implant       Current Meds  Medication Sig  . ALPRAZolam (XANAX) 0.5 MG tablet 1/2 - 1 tab by mouth at bedtime as needed (Patient taking differently: Take 0.25-0.5 mg by mouth 2 (two) times daily. )  . diltiazem (CARDIZEM) 60 MG tablet Take 1 tablet (60 mg total) by mouth every 8 (eight) hours for 30 days. (Patient taking differently: Take 60 mg by mouth every 8 (eight) hours. Pt was ordered to break tab in half and take 4 times a day)  . furosemide (LASIX) 20 MG tablet TAKE 1 TABLET BY MOUTH ONCE DAILY AS NEEDED FOR SWELLING (Patient taking differently: Take 20 mg by mouth daily as needed for fluid. )  . Rivaroxaban (XARELTO) 15 MG TABS tablet TAKE 1 TABLET BY MOUTH ONCE DAILY WITH SUPPER (Patient taking differently: Take 15 mg by mouth daily. )  . timolol (  BETIMOL) 0.5 % ophthalmic solution Place 1 drop into both eyes 2 (two) times daily.      Allergies:   Cymbalta [duloxetine hcl]   Social History   Tobacco Use  . Smoking status: Never Smoker  . Smokeless tobacco: Never Used  Substance Use Topics  . Alcohol use: No  . Drug use: No     Family Hx: The patient's family history includes CAD in her mother; Cancer in her brother and sister.  ROS:   Please see the history of present illness.    No fevers, chills or productive cough. All other systems reviewed and are negative.  Recent Labs: 06/17/2018: TSH 0.55 07/02/2018: ALT 61; Magnesium 1.9  07/03/2018: BUN 12; Creatinine, Ser 0.75; Hemoglobin 12.6; Platelets 201; Potassium 3.8; Sodium 137   Recent Lipid Panel Lab Results  Component Value Date/Time   CHOL 181 03/16/2017 03:30 PM   TRIG 122.0 03/16/2017 03:30 PM   TRIG 107 03/04/2006 03:20 PM   HDL 78.90 03/16/2017 03:30 PM   CHOLHDL 2 03/16/2017 03:30 PM   LDLCALC 77 03/16/2017 03:30 PM    Wt Readings from Last 3 Encounters:  07/09/18 121 lb (54.9 kg)  07/04/18 121 lb 12.8 oz (55.2 kg)  06/22/18 122 lb (55.3 kg)     Objective:    Vital Signs:  BP 115/80   Pulse 91   Temp 98.5 F (36.9 C)   Wt 121 lb (54.9 kg)   SpO2 97%   BMI 22.13 kg/m    VITAL SIGNS:  reviewed  No acute distress Answers questions appropriately Normal affect Remainder of physical examination not performed (telehealth visit; coronavirus pandemic)  ASSESSMENT & PLAN:    1. Permanent atrial fibrillation-continue Cardizem for rate control.  Difficult situation concerning anticoagulation.  She has fallen in the past.  She also has a small CVA on recent MRI.  We will plan to continue for now.  I asked her to use her walker. 2. Moderate mitral regurgitation/moderate to severe tricuspid regurgitation-we are being conservative given patient's age and overall medical condition. 3. Hypertension-patient's blood pressure is controlled.  Continue present medications and follow. 4. Chronic diastolic congestive heart failure-she has excess volume based on history.  We have increased her Lasix to 40 mg daily for 3 days then resume 20 mg daily.  Check potassium and renal function in 1 week.  We discussed fluid restriction. 5.  COVID-19 Education: The importance of social distancing was discussed today.  Time:   Today, I have spent 12 minutes with the patient with telehealth technology discussing the above problems.     Medication Adjustments/Labs and Tests Ordered: Current medicines are reviewed at length with the patient today.  Concerns regarding  medicines are outlined above.   Tests Ordered: No orders of the defined types were placed in this encounter.   Medication Changes: No orders of the defined types were placed in this encounter.   Disposition:  Follow up in 6 month(s)  Signed, Olga Millers, MD  07/09/2018 2:13 PM    [facility]

## 2018-07-09 ENCOUNTER — Telehealth (INDEPENDENT_AMBULATORY_CARE_PROVIDER_SITE_OTHER): Payer: PPO | Admitting: Cardiology

## 2018-07-09 ENCOUNTER — Encounter: Payer: Self-pay | Admitting: Cardiology

## 2018-07-09 VITALS — BP 115/80 | HR 91 | Temp 98.5°F | Wt 121.0 lb

## 2018-07-09 DIAGNOSIS — I5032 Chronic diastolic (congestive) heart failure: Secondary | ICD-10-CM

## 2018-07-09 DIAGNOSIS — G8929 Other chronic pain: Secondary | ICD-10-CM | POA: Diagnosis not present

## 2018-07-09 DIAGNOSIS — M545 Low back pain, unspecified: Secondary | ICD-10-CM

## 2018-07-09 NOTE — Patient Instructions (Signed)
Medication Instructions:  TAKE 40 MG OF FUROSEMIDE ONCE DAILY X 3 DAYS AND THEN DECREASE BACK TO 20 MG ONCE DAILY If you need a refill on your cardiac medications before your next appointment, please call your pharmacy.   Lab work: Your physician recommends that you return for lab work in: ONE WEEK If you have labs (blood work) drawn today and your tests are completely normal, you will receive your results only by: Marland Kitchen MyChart Message (if you have MyChart) OR . A paper copy in the mail If you have any lab test that is abnormal or we need to change your treatment, we will call you to review the results.  Follow-Up: At [facility], you and your health needs are our priority.  As part of our continuing mission to provide you with exceptional heart care, we have created designated Provider Care Teams.  These Care Teams include your primary Cardiologist (physician) and Advanced Practice Providers (APPs -  Physician Assistants and Nurse Practitioners) who all work together to provide you with the care you need, when you need it. You will need a follow up appointment in 6 months.  Please call our office 2 months in advance to schedule this appointment.  You may see Olga Millers MD or one of the following Advanced Practice Providers on your designated Care Team:   Corine Shelter, PA-C Judy Pimple, [place] . Marjie Skiff, PA-C

## 2018-07-11 ENCOUNTER — Encounter: Payer: Self-pay | Admitting: Internal Medicine

## 2018-07-11 NOTE — Assessment & Plan Note (Signed)
For change diltiazem as above, continue to monitor for borderline low BP's with change, but also for decreased rate control due to overall dose decrease

## 2018-07-11 NOTE — Assessment & Plan Note (Signed)
Mild to mod, for change xanax  0.5 mg to half bid prn, to f/u any worsening symptoms or concerns

## 2018-07-11 NOTE — Assessment & Plan Note (Signed)
stable overall by history and exam, recent data reviewed with pt, and pt to continue medical treatment as before,  to f/u any worsening symptoms or concerns, to change the diltiazem to ER 120 qd

## 2018-07-13 ENCOUNTER — Telehealth: Payer: Self-pay | Admitting: Internal Medicine

## 2018-07-13 NOTE — Telephone Encounter (Signed)
Copied from CRM [id number]. Topic: Quick Communication - Home Health Verbal Orders >> Jul 13, 2018  1:31 PM Lorayne Bender wrote: Caller/Agency: Corrie Dandy from [facility] Callback Number: [phone], OK to leave a message Requesting OT/PT/Skilled Nursing/Social Work/Speech Therapy: skilled nursing - medication education Frequency: 1x a week for 2 weeks, 1x a week every other week times two.

## 2018-07-13 NOTE — Telephone Encounter (Signed)
Ok for verbals 

## 2018-07-14 ENCOUNTER — Ambulatory Visit: Payer: PPO | Attending: Internal Medicine | Admitting: Physical Therapy

## 2018-07-14 NOTE — Telephone Encounter (Signed)
Notified Shon Hale w/MD response.Marland KitchenRaechel Chute

## 2018-07-20 ENCOUNTER — Telehealth: Payer: Self-pay | Admitting: Licensed Clinical Social Worker

## 2018-07-20 ENCOUNTER — Telehealth: Payer: Self-pay | Admitting: Cardiology

## 2018-07-20 NOTE — Telephone Encounter (Signed)
Spoke with Silvio Pate and provided her the number to [facility] Social Services at  [phone] to report incident. Former caregiver voiced understanding.

## 2018-07-20 NOTE — Telephone Encounter (Signed)
Left message to call back  

## 2018-07-20 NOTE — Telephone Encounter (Signed)
F/ u Message             Patient missed call would like a call back

## 2018-07-20 NOTE — Telephone Encounter (Signed)
CSW received request to follow up with patient and family regarding safety issues at home with patient. CSW attempted to reach patient and then her sister although no answer at either number. CSW contacted patient's nephew Ray who reports patient is "doing well and getting around without any problems". Nephew reports the family initially post hospitalization had 24/7 care per the recommendations from the hospital although she has improved and she "doesn't like people in her house and having to always have someone walk with her". Nephew states they manage all her shopping, food and medications and she is independent now with ambulation. He said they had her out to [facility] yesterday. He stated that the family  " wanted to respect her wishes of remaining home and independent." They had a medic alert system put in place as well and deny any concerns or needs at this time.   Patient then returned CSW call and confirmed all of the above. She also mentioned that her sister was at her house and they were on their way out to get some shopping done. Patient states she plans to remain independent and denies any concerns at this time.   CSW provided number for future contact to both patient and her nephew. No further needs at this time. CSW available if needed in the future. Lasandra Beech, LCSW, CCSW-MCS [phone]

## 2018-07-20 NOTE — Telephone Encounter (Signed)
New Message   Former caretaker Velna Hatchet calling stating the patient has 24hr care and the patients brother and sister has released all the caretakers so now the patient is home by herself and unable to walk, take medication, and she wanted to report the neglect.

## 2018-07-22 DIAGNOSIS — H409 Unspecified glaucoma: Secondary | ICD-10-CM

## 2018-07-22 DIAGNOSIS — G629 Polyneuropathy, unspecified: Secondary | ICD-10-CM

## 2018-07-22 DIAGNOSIS — I6522 Occlusion and stenosis of left carotid artery: Secondary | ICD-10-CM | POA: Diagnosis not present

## 2018-07-22 DIAGNOSIS — M5020 Other cervical disc displacement, unspecified cervical region: Secondary | ICD-10-CM

## 2018-07-22 DIAGNOSIS — S22028D Other fracture of second thoracic vertebra, subsequent encounter for fracture with routine healing: Secondary | ICD-10-CM | POA: Diagnosis not present

## 2018-07-22 DIAGNOSIS — F419 Anxiety disorder, unspecified: Secondary | ICD-10-CM

## 2018-07-22 DIAGNOSIS — R0602 Shortness of breath: Secondary | ICD-10-CM | POA: Diagnosis not present

## 2018-07-22 DIAGNOSIS — F329 Major depressive disorder, single episode, unspecified: Secondary | ICD-10-CM

## 2018-07-22 DIAGNOSIS — S22030D Wedge compression fracture of third thoracic vertebra, subsequent encounter for fracture with routine healing: Secondary | ICD-10-CM

## 2018-07-22 DIAGNOSIS — I700 Atherosclerosis of aorta: Secondary | ICD-10-CM | POA: Diagnosis not present

## 2018-07-22 DIAGNOSIS — J45909 Unspecified asthma, uncomplicated: Secondary | ICD-10-CM

## 2018-07-22 DIAGNOSIS — I4891 Unspecified atrial fibrillation: Secondary | ICD-10-CM | POA: Diagnosis not present

## 2018-07-22 DIAGNOSIS — I726 Aneurysm of vertebral artery: Secondary | ICD-10-CM

## 2018-07-22 DIAGNOSIS — R296 Repeated falls: Secondary | ICD-10-CM

## 2018-07-22 DIAGNOSIS — I341 Nonrheumatic mitral (valve) prolapse: Secondary | ICD-10-CM

## 2018-07-22 DIAGNOSIS — I119 Hypertensive heart disease without heart failure: Secondary | ICD-10-CM

## 2018-07-22 DIAGNOSIS — M810 Age-related osteoporosis without current pathological fracture: Secondary | ICD-10-CM

## 2018-07-22 DIAGNOSIS — R739 Hyperglycemia, unspecified: Secondary | ICD-10-CM

## 2018-07-22 DIAGNOSIS — R071 Chest pain on breathing: Secondary | ICD-10-CM | POA: Diagnosis not present

## 2018-07-22 DIAGNOSIS — Z7901 Long term (current) use of anticoagulants: Secondary | ICD-10-CM

## 2018-08-09 ENCOUNTER — Ambulatory Visit: Payer: Self-pay | Admitting: *Deleted

## 2018-08-09 NOTE — Telephone Encounter (Signed)
  Reason for Disposition . New or worsened shortness of breath with activity (dyspnea on exertion)  Protocols used: HEART RATE AND HEARTBEAT QUESTIONS-A-AH

## 2018-08-09 NOTE — Telephone Encounter (Signed)
Message from Pauline Good sent at 08/09/2018 4:57 PM EDT  Ann Booth/Physical Therapist is at pt's home and called because pt is having SOB and BP was 135/102 HR 104-114. Pt cant locate her losartan for her bp, Was on hold waiting on triage for 20 mins. Several time someone pick up but their reception wouldn't allow them to pick up call. Ann Booth hung up. Please call    Called Ann Booth and spoke her and  Ann Booth (physical therapist) from [place] regarding her b/p and HR.  She stated that she had been without her b/p medication for a while and that is why her b/p is elevated. The PT denies fever. She stated that she had a hard time getting her to walk because of the shortness of breath, which she gets that way with minimal exertion. While the patient is sitting her b/p is 130/80 but heart rate remains tachy and irregular. Pt voice that hear is racing. She also c/o that her abd feels tight and Ann Booth stated she has 2+ edema in her ankles. Pt also voiced to me that she has people staying with her that does not know who they are. She told Ann Booth that they come in at night. And she would like for these people to leave. They are not there right now. Per pt she does not have family close by. She needs assistance with her medication, Ann Booth stated her pill box was empty. So not sure what medication she has not taken. She also needs assistance with ADL's. Because of her shortness of breath, swelling in her ankles and being tachy to call 911. She also c/o some chest tightness. Ann Booth voiced understanding. Routing to LB at [facility] for review and recommendation.

## 2018-08-10 NOTE — Telephone Encounter (Signed)
Noted. Routing to PCP.

## 2018-08-23 ENCOUNTER — Telehealth: Payer: Self-pay

## 2018-08-23 ENCOUNTER — Other Ambulatory Visit: Payer: Self-pay | Admitting: Internal Medicine

## 2018-08-23 NOTE — Telephone Encounter (Signed)
I dont see how to help except to offer an OV to address her health concerns (with family present) and continue the ADS efforts   Also I will refer to [facility] as I think she qualifies

## 2018-08-23 NOTE — Telephone Encounter (Signed)
Ok to ask [name] to relay any specific questions or concerns, and any suggestions to improve her situation, thanks

## 2018-08-23 NOTE — Telephone Encounter (Signed)
Ann Booth stated that she is worried about the pt. She stated that she placed a call to adult protective services due to family support seems questionable and stated protective services have had a hard time trying to reach out to the family or pt. She stated that the pt is experiencing really severe dementia. She recalled a time that PT was out at the home providing services and established vitals that showed the pt being in a-fib, EMS was called, however pt declined being transported to the ED.  She also stated that the pt has been having falls that has been seen on cameras installed by the family to monitor the pt. Pt also has life alert in the home but is not compliment with taking medications. Ann Booth also stated that she is worried about the lack of food in the home for the patient from looking in the fridge. She would like to know how PCP would like to proceed?

## 2018-08-23 NOTE — Telephone Encounter (Signed)
Copied from [place] [phone]. Topic: General - Other >> Aug 23, 2018  9:35 AM Rainey Pines A wrote: Mary home health nurse would like to speak with Dr. Jenny Reichmann about patients severe dementia and not properly taking medications.

## 2018-08-24 ENCOUNTER — Other Ambulatory Visit: Payer: Self-pay

## 2018-08-24 NOTE — Patient Outreach (Signed)
[facility]) Care Management  08/24/2018  Ann Booth 30-Sep-1926 [id number]   Telephone Screen  Referral Date: 08/23/2018 Referral Source: MD Office Referral Reason: "dementia, low family support, falls, A-fib, noncompliant, high risk" Insurance: HTA   Outreach attempt # 1 to patient. No answer. RN CM left HIPAA compliant voicemail message along with contact info.    Plan: RN CM will make outreach attempt to patient within 3-4 business days. RN CM will send unsuccessful outreach letter to patient.  Enzo Montgomery, RN,BSN,CCM [place] Management Telephonic Care Management Coordinator Direct Phone: [phone] Toll Free: [phone] Fax: [phone]

## 2018-08-24 NOTE — Telephone Encounter (Signed)
Ann Booth has been informed of PCP's response. She will continue her efforts with adult protect services. She did mention during this conversation that she had had two of the patients neighbors approach her during her visits concerned about the patient. One neighbor brings her mail and the other one kind of looks out for her. I informed Ann Booth that we have placed the [facility] referral.

## 2018-08-26 ENCOUNTER — Other Ambulatory Visit: Payer: Self-pay

## 2018-08-26 NOTE — Patient Outreach (Signed)
[facility] [facility]) Care Management  08/26/2018  Ann Booth 12/22/1926 [id number]   Telephone Screen  Referral Date: 08/23/2018 Referral Source: MD Office Referral Reason: "dementia, low family support, falls, A-fib, noncompliant, high risk" Insurance: HTA    Outreach attempt #2 to patient. No answer at present. RN CM left HIPAA compliant voicemail message.      Plan: RN CM will make outreach attempt to patient within 3-4 business days.   Enzo Montgomery, RN,BSN,CCM [place] Management Telephonic Care Management Coordinator Direct Phone: [phone] Toll Free: [phone] Fax: [phone]

## 2018-08-30 ENCOUNTER — Other Ambulatory Visit: Payer: Self-pay

## 2018-08-30 NOTE — Patient Outreach (Signed)
[place] [facility]) Care Management  08/30/2018  SIEARA BREMER Nov 13, 1926 [id number]   Telephone Screen  Referral Date:08/23/2018 Referral Source:MD Office Referral Reason:"dementia, low family support, falls, A-fib, noncompliant, high risk" Insurance:HTA   Outreach attempt #3 to patient. No answer at present. RN CM left HIPAA compliant voicemail message along with contact info.     Plan: RN CM will close case if no response from letter mailed to patient.    Enzo Montgomery, RN,BSN,CCM [place] Management Telephonic Care Management Coordinator Direct Phone: [phone] Toll Free: [phone] Fax: [phone]

## 2018-09-03 ENCOUNTER — Other Ambulatory Visit: Payer: Self-pay

## 2018-09-03 NOTE — Patient Outreach (Signed)
[facility]) Care Management  09/03/2018  SHYLIN KEIZER 1926/04/26 [id number]   Telephone Screen  Referral Date:08/23/2018 Referral Source:MD Office Referral Reason:"dementia, low family support, falls, A-fib, noncompliant, high risk" Insurance:HTA    Multiple attempts to establish contact with patient without success. No response from letter mailed to patient. Case is being closed at this time.     Plan: RN CM will close case at this time. RN CM will send MD case closure letter.  Enzo Montgomery, RN,BSN,CCM [place] Management Telephonic Care Management Coordinator Direct Phone: [phone] Toll Free: [phone] Fax: [phone]

## 2018-09-17 ENCOUNTER — Ambulatory Visit: Payer: PPO | Admitting: Internal Medicine

## 2018-10-06 ENCOUNTER — Ambulatory Visit: Payer: PPO | Admitting: Internal Medicine

## 2018-10-12 ENCOUNTER — Telehealth: Payer: Self-pay | Admitting: Internal Medicine

## 2018-10-12 NOTE — Telephone Encounter (Signed)
Noted  

## 2018-10-12 NOTE — Telephone Encounter (Signed)
Ann Booth from [place] called to report that services began yesterday for home health.

## 2018-11-03 ENCOUNTER — Other Ambulatory Visit: Payer: Self-pay | Admitting: Cardiology

## 2018-11-03 NOTE — Telephone Encounter (Signed)
Refill request

## 2018-11-08 ENCOUNTER — Ambulatory Visit: Payer: PPO | Admitting: Internal Medicine

## 2018-11-15 DIAGNOSIS — H401112 Primary open-angle glaucoma, right eye, moderate stage: Secondary | ICD-10-CM | POA: Diagnosis not present

## 2018-11-15 DIAGNOSIS — H401123 Primary open-angle glaucoma, left eye, severe stage: Secondary | ICD-10-CM | POA: Diagnosis not present

## 2019-03-22 ENCOUNTER — Other Ambulatory Visit: Payer: Self-pay

## 2019-03-22 ENCOUNTER — Encounter: Payer: Self-pay | Admitting: Internal Medicine

## 2019-03-22 ENCOUNTER — Ambulatory Visit (INDEPENDENT_AMBULATORY_CARE_PROVIDER_SITE_OTHER): Payer: PPO | Admitting: Internal Medicine

## 2019-03-22 ENCOUNTER — Ambulatory Visit: Payer: PPO

## 2019-03-22 VITALS — BP 140/89 | HR 83 | Temp 98.5°F | Ht 62.0 in | Wt 119.0 lb

## 2019-03-22 DIAGNOSIS — F22 Delusional disorders: Secondary | ICD-10-CM

## 2019-03-22 DIAGNOSIS — Z0000 Encounter for general adult medical examination without abnormal findings: Secondary | ICD-10-CM | POA: Diagnosis not present

## 2019-03-22 DIAGNOSIS — R413 Other amnesia: Secondary | ICD-10-CM | POA: Diagnosis not present

## 2019-03-22 DIAGNOSIS — F329 Major depressive disorder, single episode, unspecified: Secondary | ICD-10-CM

## 2019-03-22 DIAGNOSIS — F32A Depression, unspecified: Secondary | ICD-10-CM

## 2019-03-22 DIAGNOSIS — R739 Hyperglycemia, unspecified: Secondary | ICD-10-CM

## 2019-03-22 LAB — LIPID PANEL
Cholesterol: 147 mg/dL (ref 0–200)
HDL: 83.6 mg/dL (ref >39.00)
LDL Cholesterol: 47 mg/dL (ref 0–99)
NonHDL: 63.76
Total CHOL/HDL Ratio: 2
Triglycerides: 85 mg/dL (ref 0.0–149.0)
VLDL: 17 mg/dL (ref 0.0–40.0)

## 2019-03-22 LAB — CBC WITH DIFFERENTIAL/PLATELET
Basophils Absolute: 0.1 10*3/uL (ref 0.0–0.1)
Basophils Relative: 1.1 % (ref 0.0–3.0)
Eosinophils Absolute: 0.2 10*3/uL (ref 0.0–0.7)
Eosinophils Relative: 3.8 % (ref 0.0–5.0)
HCT: 40 % (ref 36.0–46.0)
Hemoglobin: 13.4 g/dL (ref 12.0–15.0)
Lymphocytes Relative: 27.4 % (ref 12.0–46.0)
Lymphs Abs: 1.3 10*3/uL (ref 0.7–4.0)
MCHC: 33.5 g/dL (ref 30.0–36.0)
MCV: 97.5 fl (ref 78.0–100.0)
Monocytes Absolute: 0.7 10*3/uL (ref 0.1–1.0)
Monocytes Relative: 13.8 % — ABNORMAL HIGH (ref 3.0–12.0)
Neutro Abs: 2.7 10*3/uL (ref 1.4–7.7)
Neutrophils Relative %: 53.9 % (ref 43.0–77.0)
Platelets: 164 10*3/uL (ref 150.0–400.0)
RBC: 4.1 Mil/uL (ref 3.87–5.11)
RDW: 13.9 % (ref 11.5–15.5)
WBC: 4.9 10*3/uL (ref 4.0–10.5)

## 2019-03-22 LAB — TSH: TSH: 0.35 u[IU]/mL (ref 0.35–4.50)

## 2019-03-22 LAB — HEPATIC FUNCTION PANEL
ALT: 21 U/L (ref 0–35)
AST: 30 U/L (ref 0–37)
Albumin: 3.9 g/dL (ref 3.5–5.2)
Alkaline Phosphatase: 88 U/L (ref 39–117)
Bilirubin, Direct: 0.1 mg/dL (ref 0.0–0.3)
Total Bilirubin: 0.7 mg/dL (ref 0.2–1.2)
Total Protein: 7.1 g/dL (ref 6.0–8.3)

## 2019-03-22 LAB — BASIC METABOLIC PANEL
BUN: 21 mg/dL (ref 6–23)
CO2: 33 mEq/L — ABNORMAL HIGH (ref 19–32)
Calcium: 10.4 mg/dL (ref 8.4–10.5)
Chloride: 101 mEq/L (ref 96–112)
Creatinine, Ser: 0.74 mg/dL (ref 0.40–1.20)
GFR: 73.55 mL/min (ref >60.00)
Glucose, Bld: 105 mg/dL — ABNORMAL HIGH (ref 70–99)
Potassium: 4.2 mEq/L (ref 3.5–5.1)
Sodium: 141 mEq/L (ref 135–145)

## 2019-03-22 LAB — HEMOGLOBIN A1C: Hgb A1c MFr Bld: 5.7 % (ref 4.6–6.5)

## 2019-03-22 MED ORDER — DONEPEZIL HCL 5 MG PO TABS: 5.0000 mg | ORAL_TABLET | Freq: Every day | ORAL | 5 refills | Status: DC

## 2019-03-22 MED ORDER — CITALOPRAM HYDROBROMIDE 10 MG PO TABS: 10.0000 mg | ORAL_TABLET | Freq: Every day | ORAL | 3 refills | Status: AC

## 2019-03-22 NOTE — Assessment & Plan Note (Signed)
stable overall by history and exam, recent data reviewed with pt, and pt to continue medical treatment as before,  to f/u any worsening symptoms or concerns  

## 2019-03-22 NOTE — Assessment & Plan Note (Signed)
New recent onset, for tx as above, follow

## 2019-03-22 NOTE — Progress Notes (Signed)
Subjective:    Patient ID: Ann Booth, female    DOB: 09-Aug-1926, 86 y.o.   MRN: [id number]  HPI  Here for wellness and f/u;  Overall doing ok;  Pt denies Chest pain, worsening SOB, DOE, wheezing, orthopnea, PND, worsening LE edema, palpitations, dizziness or syncope.  Pt denies neurological change such as new headache, facial or extremity weakness.  Pt denies polydipsia, polyuria, or low sugar symptoms. Pt states overall good compliance with treatment and medications, good tolerability, and has been trying to follow appropriate diet. . No fever, night sweats, wt loss, loss of appetite, or other constitutional symptoms.  Pt states good ability with ADL's, has low fall risk, home safety reviewed and adequate, no other significant changes in hearing or vision, and only occasionally active with exercise. Also c/o worsening depression in last 2-3 mo, memory changes including ST memory, and new delusional thoughts about people in the house leaving though she always finds the doors locked from the inside.   Past Medical History:  Diagnosis Date  . ALLERGIC RHINITIS   . ANXIETY   . ASTHMA   . Atrial fibrillation ([place])   . BACK PAIN   . Carotid stenosis 04/27/2012   Mild left on community screening  - Nov 2013  . CATARACT NOS   . DEPRESSION   . GLAUCOMA NOS   . HTN (hypertension)   . Hypercalcemia   . MITRAL VALVE PROLAPSE   . OSTEOPOROSIS   . PERIPHERAL NEUROPATHY   . POSITIONAL VERTIGO   . Rosacea    Past Surgical History:  Procedure Laterality Date  .  ankle surgury     left ankle  . CATARACT EXTRACTION, BILATERAL    . HEMORRHOID SURGERY    . posterior chamber interocular lens implant      reports that she has never smoked. She has never used smokeless tobacco. She reports that she does not drink alcohol or use drugs. family history includes CAD in her mother; Cancer in her brother and sister. Allergies  Allergen Reactions  . Cymbalta [Duloxetine Hcl] Other (See Comments)    Current Outpatient Medications on File Prior to Visit  Medication Sig Dispense Refill  . ALPRAZolam (XANAX) 0.5 MG tablet 1/2 - 1 tab by mouth at bedtime as needed (Patient taking differently: Take 0.25-0.5 mg by mouth 2 (two) times daily. ) 90 tablet 1  . furosemide (LASIX) 20 MG tablet TAKE 1 TABLET BY MOUTH ONCE DAILY AS NEEDED FOR SWELLING (Patient taking differently: Take 20 mg by mouth daily as needed for fluid. ) 90 tablet 3  . Rivaroxaban (XARELTO) 15 MG TABS tablet Take 1 tablet (15 mg total) by mouth daily with supper. 90 tablet 1  . timolol (BETIMOL) 0.5 % ophthalmic solution Place 1 drop into both eyes 2 (two) times daily.     Marland Kitchen diltiazem (CARDIZEM) 60 MG tablet Take 1 tablet (60 mg total) by mouth every 8 (eight) hours for 30 days. (Patient taking differently: Take 60 mg by mouth every 8 (eight) hours. Pt was ordered to break tab in half and take 4 times a day) 90 tablet 0  . FLUZONE HIGH-DOSE QUADRIVALENT 0.7 ML SUSY     . lidocaine (LIDODERM) 5 % Place 1 patch onto the skin daily. Remove & Discard patch within 12 hours or as directed by MD (Patient not taking: Reported on 07/09/2018) 60 patch 1  . losartan (COZAAR) 50 MG tablet Take 50 mg by mouth daily.    . traMADol Veatrice Bourbon)  50 MG tablet Take 1 tablet (50 mg total) by mouth every 6 (six) hours as needed. (Patient not taking: Reported on 07/09/2018) 120 tablet 2   No current facility-administered medications on file prior to visit.   Review of Systems  Constitutional: Negative for other unusual diaphoresis or sweats HENT: Negative for ear discharge or swelling Eyes: Negative for other worsening visual disturbances Respiratory: Negative for stridor or other swelling  Gastrointestinal: Negative for worsening distension or other blood Genitourinary: Negative for retention or other urinary change Musculoskeletal: Negative for other MSK pain or swelling Skin: Negative for color change or other new lesions Neurological: Negative for  worsening tremors and other numbness  Psychiatric/Behavioral: Negative for worsening agitation or other fatigue All otherwise neg per pt     Objective:   Physical Exam BP 140/89 (BP Location: Left Arm, Patient Position: Sitting, Cuff Size: Normal)   Pulse 83   Temp 98.5 F (36.9 C) (Oral)   Ht 5\' 2"  (1.575 m)   Wt 119 lb (54 kg)   SpO2 99%   BMI 21.77 kg/m  VS noted,  Constitutional: Pt appears in NAD HENT: Head: NCAT.  Right Ear: External ear normal.  Left Ear: External ear normal.  Eyes: . Pupils are equal, round, and reactive to light. Conjunctivae and EOM are normal Nose: without d/c or deformity Neck: Neck supple. Gross normal ROM Cardiovascular: Normal rate and regular rhythm.   Pulmonary/Chest: Effort normal and breath sounds without rales or wheezing.  Abd:  Soft, NT, ND, + BS, no organomegaly Neurological: Pt is alert. At baseline orientation, motor grossly intact Skin: Skin is warm. No rashes, other new lesions, no LE edema Psychiatric: Pt behavior is normal without agitation  All otherwise neg per pt Lab Results  Component Value Date   WBC 4.9 03/22/2019   HGB 13.4 03/22/2019   HCT 40.0 03/22/2019   PLT 164.0 03/22/2019   GLUCOSE 105 (H) 03/22/2019   CHOL 147 03/22/2019   TRIG 85.0 03/22/2019   HDL 83.60 03/22/2019   LDLCALC 47 03/22/2019   ALT 21 03/22/2019   AST 30 03/22/2019   NA 141 03/22/2019   K 4.2 03/22/2019   CL 101 03/22/2019   CREATININE 0.74 03/22/2019   BUN 21 03/22/2019   CO2 33 (H) 03/22/2019   TSH 0.35 03/22/2019   INR 1.2 06/22/2018   HGBA1C 5.7 03/22/2019      Assessment & Plan:

## 2019-03-22 NOTE — Patient Instructions (Signed)
Please take all new medication as prescribed - the celexa 10 mg for depression, and aricept 5 mg for memory  Please continue all other medications as before, and refills have been done if requested.  Please have the pharmacy call with any other refills you may need.  Please continue your efforts at being more active, low cholesterol diet, and weight control.  You are otherwise up to date with prevention measures today.  Please keep your appointments with your specialists as you may have planned  Please go to the LAB at the blood drawing area for the tests to be done  You will be contacted by phone if any changes need to be made immediately.  Otherwise, you will receive a letter about your results with an explanation, but please check with MyChart first.  Please remember to sign up for MyChart if you have not done so, as this will be important to you in the future with finding out test results, communicating by private email, and scheduling acute appointments online when needed.  Please make an Appointment to return in 6 months, or sooner if needed

## 2019-03-22 NOTE — Assessment & Plan Note (Signed)
For celexa 10 qd 

## 2019-03-22 NOTE — Assessment & Plan Note (Signed)

## 2019-03-22 NOTE — Assessment & Plan Note (Signed)
For aricept 5 qd, delcines mri or neurology referral

## 2019-04-08 ENCOUNTER — Other Ambulatory Visit: Payer: Self-pay

## 2019-04-08 ENCOUNTER — Emergency Department (HOSPITAL_COMMUNITY): Payer: PPO

## 2019-04-08 ENCOUNTER — Inpatient Hospital Stay (HOSPITAL_COMMUNITY): Payer: PPO

## 2019-04-08 ENCOUNTER — Encounter (HOSPITAL_COMMUNITY): Payer: Self-pay

## 2019-04-08 ENCOUNTER — Inpatient Hospital Stay (HOSPITAL_COMMUNITY)
Admission: EM | Admit: 2019-04-08 | Discharge: 2019-04-12 | DRG: 280 | Disposition: A | Discharge status: Skilled Nursing Facility | Payer: PPO | Attending: Interventional Cardiology | Admitting: Interventional Cardiology

## 2019-04-08 DIAGNOSIS — J45909 Unspecified asthma, uncomplicated: Secondary | ICD-10-CM | POA: Diagnosis present

## 2019-04-08 DIAGNOSIS — M810 Age-related osteoporosis without current pathological fracture: Secondary | ICD-10-CM | POA: Diagnosis present

## 2019-04-08 DIAGNOSIS — I2109 ST elevation (STEMI) myocardial infarction involving other coronary artery of anterior wall: Secondary | ICD-10-CM | POA: Diagnosis not present

## 2019-04-08 DIAGNOSIS — R279 Unspecified lack of coordination: Secondary | ICD-10-CM | POA: Diagnosis not present

## 2019-04-08 DIAGNOSIS — I5043 Acute on chronic combined systolic (congestive) and diastolic (congestive) heart failure: Secondary | ICD-10-CM | POA: Diagnosis present

## 2019-04-08 DIAGNOSIS — I4891 Unspecified atrial fibrillation: Secondary | ICD-10-CM | POA: Diagnosis not present

## 2019-04-08 DIAGNOSIS — I10 Essential (primary) hypertension: Secondary | ICD-10-CM | POA: Diagnosis present

## 2019-04-08 DIAGNOSIS — Z7901 Long term (current) use of anticoagulants: Secondary | ICD-10-CM

## 2019-04-08 DIAGNOSIS — R41841 Cognitive communication deficit: Secondary | ICD-10-CM | POA: Diagnosis not present

## 2019-04-08 DIAGNOSIS — Z9181 History of falling: Secondary | ICD-10-CM

## 2019-04-08 DIAGNOSIS — R404 Transient alteration of awareness: Secondary | ICD-10-CM | POA: Diagnosis not present

## 2019-04-08 DIAGNOSIS — Z743 Need for continuous supervision: Secondary | ICD-10-CM | POA: Diagnosis not present

## 2019-04-08 DIAGNOSIS — Z809 Family history of malignant neoplasm, unspecified: Secondary | ICD-10-CM

## 2019-04-08 DIAGNOSIS — H409 Unspecified glaucoma: Secondary | ICD-10-CM | POA: Diagnosis not present

## 2019-04-08 DIAGNOSIS — Z20822 Contact with and (suspected) exposure to covid-19: Secondary | ICD-10-CM | POA: Diagnosis not present

## 2019-04-08 DIAGNOSIS — I482 Chronic atrial fibrillation, unspecified: Secondary | ICD-10-CM

## 2019-04-08 DIAGNOSIS — F411 Generalized anxiety disorder: Secondary | ICD-10-CM | POA: Diagnosis not present

## 2019-04-08 DIAGNOSIS — Z8249 Family history of ischemic heart disease and other diseases of the circulatory system: Secondary | ICD-10-CM | POA: Diagnosis not present

## 2019-04-08 DIAGNOSIS — Z7189 Other specified counseling: Secondary | ICD-10-CM | POA: Diagnosis not present

## 2019-04-08 DIAGNOSIS — R627 Adult failure to thrive: Secondary | ICD-10-CM | POA: Diagnosis not present

## 2019-04-08 DIAGNOSIS — I499 Cardiac arrhythmia, unspecified: Secondary | ICD-10-CM | POA: Diagnosis not present

## 2019-04-08 DIAGNOSIS — I340 Nonrheumatic mitral (valve) insufficiency: Secondary | ICD-10-CM

## 2019-04-08 DIAGNOSIS — I361 Nonrheumatic tricuspid (valve) insufficiency: Secondary | ICD-10-CM

## 2019-04-08 DIAGNOSIS — I083 Combined rheumatic disorders of mitral, aortic and tricuspid valves: Secondary | ICD-10-CM | POA: Diagnosis not present

## 2019-04-08 DIAGNOSIS — Z9842 Cataract extraction status, left eye: Secondary | ICD-10-CM

## 2019-04-08 DIAGNOSIS — I213 ST elevation (STEMI) myocardial infarction of unspecified site: Secondary | ICD-10-CM

## 2019-04-08 DIAGNOSIS — R296 Repeated falls: Secondary | ICD-10-CM | POA: Diagnosis present

## 2019-04-08 DIAGNOSIS — R0789 Other chest pain: Secondary | ICD-10-CM | POA: Diagnosis not present

## 2019-04-08 DIAGNOSIS — I214 Non-ST elevation (NSTEMI) myocardial infarction: Secondary | ICD-10-CM | POA: Diagnosis not present

## 2019-04-08 DIAGNOSIS — I5021 Acute systolic (congestive) heart failure: Secondary | ICD-10-CM

## 2019-04-08 DIAGNOSIS — S42002A Fracture of unspecified part of left clavicle, initial encounter for closed fracture: Secondary | ICD-10-CM | POA: Diagnosis not present

## 2019-04-08 DIAGNOSIS — I4821 Permanent atrial fibrillation: Secondary | ICD-10-CM | POA: Diagnosis not present

## 2019-04-08 DIAGNOSIS — Z9841 Cataract extraction status, right eye: Secondary | ICD-10-CM | POA: Diagnosis not present

## 2019-04-08 DIAGNOSIS — E785 Hyperlipidemia, unspecified: Secondary | ICD-10-CM | POA: Diagnosis not present

## 2019-04-08 DIAGNOSIS — I200 Unstable angina: Secondary | ICD-10-CM

## 2019-04-08 DIAGNOSIS — Z66 Do not resuscitate: Secondary | ICD-10-CM | POA: Diagnosis present

## 2019-04-08 DIAGNOSIS — I110 Hypertensive heart disease with heart failure: Secondary | ICD-10-CM | POA: Diagnosis present

## 2019-04-08 DIAGNOSIS — R2689 Other abnormalities of gait and mobility: Secondary | ICD-10-CM | POA: Diagnosis not present

## 2019-04-08 DIAGNOSIS — Z515 Encounter for palliative care: Secondary | ICD-10-CM | POA: Diagnosis not present

## 2019-04-08 DIAGNOSIS — E782 Mixed hyperlipidemia: Secondary | ICD-10-CM | POA: Diagnosis not present

## 2019-04-08 DIAGNOSIS — I4819 Other persistent atrial fibrillation: Secondary | ICD-10-CM | POA: Diagnosis not present

## 2019-04-08 DIAGNOSIS — R079 Chest pain, unspecified: Secondary | ICD-10-CM | POA: Diagnosis not present

## 2019-04-08 DIAGNOSIS — R0902 Hypoxemia: Secondary | ICD-10-CM | POA: Diagnosis not present

## 2019-04-08 DIAGNOSIS — Z79899 Other long term (current) drug therapy: Secondary | ICD-10-CM | POA: Diagnosis not present

## 2019-04-08 DIAGNOSIS — M6281 Muscle weakness (generalized): Secondary | ICD-10-CM | POA: Diagnosis not present

## 2019-04-08 DIAGNOSIS — R2681 Unsteadiness on feet: Secondary | ICD-10-CM | POA: Diagnosis not present

## 2019-04-08 DIAGNOSIS — R278 Other lack of coordination: Secondary | ICD-10-CM | POA: Diagnosis not present

## 2019-04-08 LAB — CBC WITH DIFFERENTIAL/PLATELET
Abs Immature Granulocytes: 0.02 10*3/uL (ref 0.00–0.07)
Basophils Absolute: 0 10*3/uL (ref 0.0–0.1)
Basophils Relative: 1 %
Eosinophils Absolute: 0 10*3/uL (ref 0.0–0.5)
Eosinophils Relative: 1 %
HCT: 41.2 % (ref 36.0–46.0)
Hemoglobin: 13.7 g/dL (ref 12.0–15.0)
Immature Granulocytes: 0 %
Lymphocytes Relative: 14 %
Lymphs Abs: 0.8 10*3/uL (ref 0.7–4.0)
MCH: 32.2 pg (ref 26.0–34.0)
MCHC: 33.3 g/dL (ref 30.0–36.0)
MCV: 96.7 fL (ref 80.0–100.0)
Monocytes Absolute: 0.4 10*3/uL (ref 0.1–1.0)
Monocytes Relative: 6 %
Neutro Abs: 4.8 10*3/uL (ref 1.7–7.7)
Neutrophils Relative %: 78 %
Platelets: 167 10*3/uL (ref 150–400)
RBC: 4.26 MIL/uL (ref 3.87–5.11)
RDW: 13.3 % (ref 11.5–15.5)
WBC: 6.1 10*3/uL (ref 4.0–10.5)
nRBC: 0 % (ref 0.0–0.2)

## 2019-04-08 LAB — I-STAT CHEM 8, ED
BUN: 17 mg/dL (ref 8–23)
Calcium, Ion: 1.14 mmol/L — ABNORMAL LOW (ref 1.15–1.40)
Chloride: 104 mmol/L (ref 98–111)
Creatinine, Ser: 0.6 mg/dL (ref 0.44–1.00)
Glucose, Bld: 147 mg/dL — ABNORMAL HIGH (ref 70–99)
HCT: 42 % (ref 36.0–46.0)
Hemoglobin: 14.3 g/dL (ref 12.0–15.0)
Potassium: 3.5 mmol/L (ref 3.5–5.1)
Sodium: 139 mmol/L (ref 135–145)
TCO2: 27 mmol/L (ref 22–32)

## 2019-04-08 LAB — ECHOCARDIOGRAM COMPLETE

## 2019-04-08 LAB — TROPONIN I (HIGH SENSITIVITY)
Troponin I (High Sensitivity): 23357 ng/L (ref <18)
Troponin I (High Sensitivity): >27000 ng/L (ref <18)

## 2019-04-08 LAB — D-DIMER, QUANTITATIVE: D-Dimer, Quant: 0.63 ug/mL-FEU — ABNORMAL HIGH (ref 0.00–0.50)

## 2019-04-08 LAB — BRAIN NATRIURETIC PEPTIDE: B Natriuretic Peptide: 476.7 pg/mL — ABNORMAL HIGH (ref 0.0–100.0)

## 2019-04-08 LAB — MAGNESIUM: Magnesium: 2 mg/dL (ref 1.7–2.4)

## 2019-04-08 LAB — RESPIRATORY PANEL BY RT PCR (FLU A&B, COVID)
Influenza A by PCR: NEGATIVE
Influenza B by PCR: NEGATIVE
SARS Coronavirus 2 by RT PCR: NEGATIVE

## 2019-04-08 LAB — HEPARIN LEVEL (UNFRACTIONATED): Heparin Unfractionated: >2.2 IU/mL — ABNORMAL HIGH (ref 0.30–0.70)

## 2019-04-08 LAB — APTT: aPTT: 33 seconds (ref 24–36)

## 2019-04-08 MED ORDER — SODIUM CHLORIDE 0.9 % IV SOLN: 250.0000 mL | INTRAVENOUS | Status: DC | PRN

## 2019-04-08 MED ORDER — SODIUM CHLORIDE 0.9% FLUSH: 3.0000 mL | INTRAVENOUS | Status: DC | PRN

## 2019-04-08 MED ORDER — HEPARIN (PORCINE) 25000 UT/250ML-% IV SOLN
650.0000 [IU]/h | INTRAVENOUS | Status: AC
  Administered 2019-04-08 – 2019-04-10 (×2): 650 [IU]/h via INTRAVENOUS
  Filled 2019-04-08 (×2): qty 250

## 2019-04-08 MED ORDER — ONDANSETRON HCL 4 MG/2ML IJ SOLN: 4.0000 mg | Freq: Four times a day (QID) | INTRAMUSCULAR | Status: DC | PRN

## 2019-04-08 MED ORDER — NITROGLYCERIN 2 % TD OINT
0.5000 [in_us] | TOPICAL_OINTMENT | Freq: Once | TRANSDERMAL | Status: AC
  Administered 2019-04-08: 0.5 [in_us] via TOPICAL
  Filled 2019-04-08: qty 1

## 2019-04-08 MED ORDER — ACETAMINOPHEN 325 MG PO TABS: 650.0000 mg | ORAL_TABLET | ORAL | Status: DC | PRN

## 2019-04-08 MED ORDER — ASPIRIN 300 MG RE SUPP: 300.0000 mg | RECTAL | Status: AC

## 2019-04-08 MED ORDER — FUROSEMIDE 10 MG/ML IJ SOLN
40.0000 mg | Freq: Once | INTRAMUSCULAR | Status: AC
  Administered 2019-04-08: 40 mg via INTRAVENOUS
  Filled 2019-04-08: qty 4

## 2019-04-08 MED ORDER — NITROGLYCERIN IN D5W 200-5 MCG/ML-% IV SOLN
0.0000 ug/min | INTRAVENOUS | Status: DC
  Administered 2019-04-09: 10 ug/min via INTRAVENOUS
  Filled 2019-04-08: qty 250

## 2019-04-08 MED ORDER — METOPROLOL TARTRATE 25 MG PO TABS
25.0000 mg | ORAL_TABLET | Freq: Four times a day (QID) | ORAL | Status: DC
  Administered 2019-04-08 – 2019-04-10 (×6): 25 mg via ORAL
  Filled 2019-04-08 (×7): qty 1

## 2019-04-08 MED ORDER — CITALOPRAM HYDROBROMIDE 10 MG PO TABS
10.0000 mg | ORAL_TABLET | Freq: Every day | ORAL | Status: DC
  Administered 2019-04-08 – 2019-04-12 (×5): 10 mg via ORAL
  Filled 2019-04-08 (×5): qty 1

## 2019-04-08 MED ORDER — ALPRAZOLAM 0.25 MG PO TABS: 0.2500 mg | ORAL_TABLET | Freq: Two times a day (BID) | ORAL | Status: DC | PRN

## 2019-04-08 MED ORDER — DONEPEZIL HCL 5 MG PO TABS
5.0000 mg | ORAL_TABLET | Freq: Every day | ORAL | Status: DC
  Administered 2019-04-08 – 2019-04-11 (×4): 5 mg via ORAL
  Filled 2019-04-08 (×4): qty 1

## 2019-04-08 MED ORDER — ASPIRIN 81 MG PO CHEW
324.0000 mg | CHEWABLE_TABLET | ORAL | Status: AC
  Filled 2019-04-08: qty 4

## 2019-04-08 MED ORDER — SODIUM CHLORIDE 0.9% FLUSH
3.0000 mL | Freq: Two times a day (BID) | INTRAVENOUS | Status: DC
  Administered 2019-04-08 – 2019-04-10 (×3): 3 mL via INTRAVENOUS

## 2019-04-08 MED ORDER — MORPHINE SULFATE (PF) 2 MG/ML IV SOLN
2.0000 mg | Freq: Once | INTRAVENOUS | Status: AC
  Administered 2019-04-08: 2 mg via INTRAVENOUS
  Filled 2019-04-08: qty 1

## 2019-04-08 MED ORDER — ASPIRIN EC 81 MG PO TBEC
81.0000 mg | DELAYED_RELEASE_TABLET | Freq: Every day | ORAL | Status: DC
  Administered 2019-04-09 – 2019-04-12 (×4): 81 mg via ORAL
  Filled 2019-04-08 (×4): qty 1

## 2019-04-08 MED ORDER — ZOLPIDEM TARTRATE 5 MG PO TABS: 5.0000 mg | ORAL_TABLET | Freq: Every evening | ORAL | Status: DC | PRN

## 2019-04-08 MED ORDER — NITROGLYCERIN 0.4 MG SL SUBL: 0.4000 mg | SUBLINGUAL_TABLET | SUBLINGUAL | Status: DC | PRN

## 2019-04-08 MED ORDER — METOPROLOL TARTRATE 5 MG/5ML IV SOLN
5.0000 mg | Freq: Once | INTRAVENOUS | Status: AC
  Administered 2019-04-08: 5 mg via INTRAVENOUS
  Filled 2019-04-08: qty 5

## 2019-04-08 NOTE — ED Triage Notes (Signed)
Pt from home with ems for acute onset left sided chest pain around 8am this morning, pain radiates to her left shoulder and back. Describes as a sharp pressure. Pt given 324 ASA and 1 Nitro en route without relief of pain. Pt c.o some shortness of breath, rr 25. Pt in a fib RVR with rate ranging from 100-140bpm. Pt a.o

## 2019-04-08 NOTE — Progress Notes (Addendum)
ANTICOAGULATION CONSULT NOTE - Initial Consult  Pharmacy Consult for heparin IV Indication: chest pain/ACS  Allergies  Allergen Reactions  . Cymbalta [Duloxetine Hcl] Other (See Comments)    Patient Measurements:   Heparin Dosing Weight: 54 kg  Vital Signs: Temp: 97.9 F (36.6 C) (02/12 1858) Temp Source: Axillary (02/12 1858) BP: 123/99 (02/12 1858) Pulse Rate: 99 (02/12 1858)  Labs: Recent Labs    04/08/19 1424 04/08/19 1446 04/08/19 1540 04/08/19 1653  HGB 13.7 14.3  --   --   HCT 41.2 42.0  --   --   PLT 167  --   --   --   APTT  --   --  33  --   HEPARINUNFRC  --   --  >2.20*  --   CREATININE  --  0.60  --   --   TROPONINIHS 23,357*  --   --  >27,000.0*    CrCl cannot be calculated (Unknown ideal weight.).   Medical History: Past Medical History:  Diagnosis Date  . ALLERGIC RHINITIS   . ANXIETY   . ASTHMA   . Atrial fibrillation ([facility])   . BACK PAIN   . Carotid stenosis 04/27/2012   Mild left on community screening  - Nov 2013  . CATARACT NOS   . DEPRESSION   . GLAUCOMA NOS   . HTN (hypertension)   . Hypercalcemia   . MITRAL VALVE PROLAPSE   . OSTEOPOROSIS   . PERIPHERAL NEUROPATHY   . POSITIONAL VERTIGO   . Rosacea     Medications:  Medications Prior to Admission  Medication Sig Dispense Refill Last Dose  . citalopram (CELEXA) 10 MG tablet Take 1 tablet (10 mg total) by mouth daily. 90 tablet 3 04/07/2019 at Unknown time  . donepezil (ARICEPT) 5 MG tablet Take 1 tablet (5 mg total) by mouth at bedtime. 90 tablet 5 04/07/2019 at Unknown time  . furosemide (LASIX) 20 MG tablet TAKE 1 TABLET BY MOUTH ONCE DAILY AS NEEDED FOR SWELLING (Patient taking differently: Take 20 mg by mouth daily as needed for fluid. ) 90 tablet 3 Past Week at Unknown time  . lidocaine (LIDODERM) 5 % Place 1 patch onto the skin daily. Remove & Discard patch within 12 hours or as directed by MD 60 patch 1 unknown at unknown  . losartan (COZAAR) 50 MG tablet Take 50 mg by mouth  daily.   04/07/2019 at Unknown time  . Rivaroxaban (XARELTO) 15 MG TABS tablet Take 1 tablet (15 mg total) by mouth daily with supper. 90 tablet 1 04/07/2019 at 2000  . timolol (BETIMOL) 0.5 % ophthalmic solution Place 1 drop into both eyes 2 (two) times daily.    unknown at Unknown time  . ALPRAZolam (XANAX) 0.5 MG tablet 1/2 - 1 tab by mouth at bedtime as needed (Patient not taking: Reported on 04/08/2019) 90 tablet 1 Not Taking at Unknown time  . diltiazem (CARDIZEM) 60 MG tablet Take 1 tablet (60 mg total) by mouth every 8 (eight) hours for 30 days. (Patient taking differently: Take 60 mg by mouth every 8 (eight) hours. Pt was ordered to break tab in half and take 4 times a day) 90 tablet 0   . traMADol (ULTRAM) 50 MG tablet Take 1 tablet (50 mg total) by mouth every 6 (six) hours as needed. (Patient not taking: Reported on 07/09/2018) 120 tablet 2 Not Taking at Unknown time   Scheduled:  . aspirin  324 mg Oral NOW   Or  .  aspirin  300 mg Rectal NOW  . [START ON 04/09/2019] aspirin EC  81 mg Oral Daily  . citalopram  10 mg Oral Daily  . donepezil  5 mg Oral QHS  . furosemide  40 mg Intravenous Once  . metoprolol tartrate  25 mg Oral Q6H  . sodium chloride flush  3 mL Intravenous Q12H    Assessment: 86 y/o female on Xarelto prior to admission for atrial fibrillation. Pharmacy has now been consulted to transition the patient from Xarelto to heparin IV infusion for ACS. Patient's last dose of Xarelto was yesterday evening.   Hgb and Plt normal at 14.3 and 167, respectively. Baseline anti-Xa level and aPTT do not correlate, therefore pharmacy will monitor by aPTT for now. Heparin IV infusion will be started approximately 24 hours after last Xarelto dose as appropriate. Will not give a heparin IV bolus to reduce risk of bleeding. No bleeding noted at this time.   Goal of Therapy:  Heparin level 0.3-0.7 units/ml aPTT 66-102 seconds Monitor platelets by anticoagulation protocol: Yes   Plan:   Start heparin infusion at 650 units/hr  Check aPTT in ~8 hours Monitor daily HL and aPTT until levels correlate and daily CBC Monitor for s/sx of bleeding  Agnes Lawrence, PharmD PGY1 Pharmacy Resident

## 2019-04-08 NOTE — Progress Notes (Signed)
  Echocardiogram 2D Echocardiogram has been performed.  Ann Booth 04/08/2019, 6:19 PM

## 2019-04-08 NOTE — Plan of Care (Signed)
Care plan initiated.

## 2019-04-08 NOTE — ED Notes (Signed)
Pt family updated

## 2019-04-08 NOTE — ED Provider Notes (Signed)
[facility] EMERGENCY DEPARTMENT Provider Note   CSN: [id number] Arrival date & time: 04/08/19  1333     History Chief Complaint  Patient presents with  . Chest Pain    Ann Booth is a 86 y.o. female.  HPI   This patient is a 86 year old female with a known history of atrial fibrillation, also has a history of hypertension, hypercalcemia and history of anxiety.  She currently takes medications including alprazolam, diltiazem, Aricept, furosemide, losartan and rivaroxaban.  She presents to the hospital by ambulance today after complaining of chest heaviness which started earlier in the day.  She cannot tell me exactly when it started but it has been persistent, heavy feeling associated with shortness of breath and not associated with nausea or diaphoresis.  She denies coughing, fever, nausea or vomiting or diarrhea, denies any abdominal pain back pain or swelling of the legs and has not had a headache or sore throat or exposure to anybody with infection or coronavirus.  She denies having any prior cardiac history other than atrial fibrillation, does not recall having any formal cardiac testing  My review of the medical record shows that the patient was last seen in the cardiology office in February 2020.  Noted to have permanent A. fib on lifelong Xarelto, diastolic congestive heart failure, appears euvolemic at that time, noted to have moderate mitral regurgitation and moderate to severe tricuspid regurgitation conservative management recommended  Past Medical History:  Diagnosis Date  . ALLERGIC RHINITIS   . ANXIETY   . ASTHMA   . Atrial fibrillation (HCC)   . BACK PAIN   . Carotid stenosis 04/27/2012   Mild left on community screening  - Nov 2013  . CATARACT NOS   . DEPRESSION   . GLAUCOMA NOS   . HTN (hypertension)   . Hypercalcemia   . MITRAL VALVE PROLAPSE   . OSTEOPOROSIS   . PERIPHERAL NEUROPATHY   . POSITIONAL VERTIGO   . Rosacea      Patient Active Problem List   Diagnosis Date Noted  . Memory changes 03/22/2019  . Delusional thoughts (HCC) 03/22/2019  . Atrial fibrillation with RVR (HCC) 07/02/2018  . Recurrent falls while walking 06/17/2018  . Urinary incontinence 06/17/2018  . Thoracic spine pain 04/05/2018  . Hyperglycemia 04/05/2018  . Falls frequently 03/27/2018  . Other fatigue 03/10/2018  . Degenerative arthritis of left knee 06/25/2017  . Peripheral vascular disease (HCC) 05/21/2017  . Rib pain 04/24/2017  . Chest pain at rest 03/16/2017  . Callus of foot 05/26/2016  . Right foot pain 03/21/2016  . Allergic conjunctivitis 03/21/2016  . Insomnia 07/28/2013  . Arthritis of sacroiliac joint 03/30/2013  . Leg length discrepancy 03/30/2013  . N&V (nausea and vomiting) 01/27/2013  . Atrial fibrillation (HCC) 11/01/2012  . Conjunctivitis 07/21/2012  . Carotid stenosis 04/27/2012  . Chronic lower back pain 04/27/2012  . Idiopathic scoliosis 04/27/2012  . Black stools 10/25/2011  . HTN (hypertension) 08/23/2010  . Preventative health care 07/25/2010  . Peripheral edema 07/25/2010  . Hypercalcemia 11/09/2008  . POSITIONAL VERTIGO 11/09/2008  . BACK PAIN 11/09/2008  . Peripheral neuropathic pain 06/01/2007  . Allergic rhinitis 06/01/2007  . SHOULDER PAIN, BILATERAL 06/01/2007  . FATIGUE 06/01/2007  . Hyperlipidemia 10/18/2006  . Anxiety state 10/18/2006  . Depression 10/18/2006  . Unspecified glaucoma 10/15/2006  . CATARACT NOS 10/15/2006  . Mitral valve disorder 10/15/2006  . Asthma 10/15/2006  . Rosacea 10/15/2006  . Osteoporosis 10/15/2006  Past Surgical History:  Procedure Laterality Date  .  ankle surgury     left ankle  . CATARACT EXTRACTION, BILATERAL    . HEMORRHOID SURGERY    . posterior chamber interocular lens implant       OB History   No obstetric history on file.     Family History  Problem Relation Age of Onset  . CAD Mother        MI at age 36  . Cancer  Sister        colon  . Cancer Brother        prostate cancer    Social History   Tobacco Use  . Smoking status: Never Smoker  . Smokeless tobacco: Never Used  Substance Use Topics  . Alcohol use: No  . Drug use: No    Home Medications Prior to Admission medications   Medication Sig Start Date End Date Taking? Authorizing Provider  ALPRAZolam (XANAX) 0.5 MG tablet 1/2 - 1 tab by mouth at bedtime as needed Patient taking differently: Take 0.25-0.5 mg by mouth 2 (two) times daily.  03/16/17   Biagio Borg, MD  citalopram (CELEXA) 10 MG tablet Take 1 tablet (10 mg total) by mouth daily. 03/22/19 03/21/20  Biagio Borg, MD  diltiazem (CARDIZEM) 60 MG tablet Take 1 tablet (60 mg total) by mouth every 8 (eight) hours for 30 days. Patient taking differently: Take 60 mg by mouth every 8 (eight) hours. Pt was ordered to break tab in half and take 4 times a day 07/04/18 08/03/18  Donne Hazel, MD  donepezil (ARICEPT) 5 MG tablet Take 1 tablet (5 mg total) by mouth at bedtime. 03/22/19   Biagio Borg, MD  FLUZONE HIGH-DOSE QUADRIVALENT 0.7 ML SUSY  11/17/18   [provider]  furosemide (LASIX) 20 MG tablet TAKE 1 TABLET BY MOUTH ONCE DAILY AS NEEDED FOR SWELLING Patient taking differently: Take 20 mg by mouth daily as needed for fluid.  03/29/18   Lelon Perla, MD  lidocaine (LIDODERM) 5 % Place 1 patch onto the skin daily. Remove & Discard patch within 12 hours or as directed by MD Patient not taking: Reported on 07/09/2018 10/05/17   Biagio Borg, MD  losartan (COZAAR) 50 MG tablet Take 50 mg by mouth daily. 03/10/19   [provider]  Rivaroxaban (XARELTO) 15 MG TABS tablet Take 1 tablet (15 mg total) by mouth daily with supper. 11/03/18   Lelon Perla, MD  timolol (BETIMOL) 0.5 % ophthalmic solution Place 1 drop into both eyes 2 (two) times daily.     [provider]  traMADol (ULTRAM) 50 MG tablet Take 1 tablet (50 mg total) by mouth every 6 (six) hours as  needed. Patient not taking: Reported on 07/09/2018 06/17/18   Biagio Borg, MD    Allergies    Cymbalta [duloxetine hcl]  Review of Systems   Review of Systems  All other systems reviewed and are negative.   Physical Exam Updated Vital Signs BP (!) 150/118 (BP Location: Right Arm)   Pulse (!) 112   Temp 97.8 F (36.6 C) (Oral)   Resp (!) 25   SpO2 96%   Physical Exam Vitals and nursing note reviewed.  Constitutional:      General: She is not in acute distress.    Appearance: She is well-developed.  HENT:     Head: Normocephalic and atraumatic.     Mouth/Throat:     Pharynx: No  oropharyngeal exudate.  Eyes:     General: No scleral icterus.       Right eye: No discharge.        Left eye: No discharge.     Conjunctiva/sclera: Conjunctivae normal.     Pupils: Pupils are equal, round, and reactive to light.  Neck:     Thyroid: No thyromegaly.     Vascular: No JVD.  Cardiovascular:     Rate and Rhythm: Tachycardia present. Rhythm irregular.     Heart sounds: Normal heart sounds. No murmur. No friction rub. No gallop.   Pulmonary:     Effort: Pulmonary effort is normal. No respiratory distress.     Breath sounds: Normal breath sounds. No wheezing or rales.  Abdominal:     General: Bowel sounds are normal. There is no distension.     Palpations: Abdomen is soft. There is no mass.     Tenderness: There is no abdominal tenderness.  Musculoskeletal:        General: No tenderness. Normal range of motion.     Cervical back: Normal range of motion and neck supple.  Lymphadenopathy:     Cervical: No cervical adenopathy.  Skin:    General: Skin is warm and dry.     Findings: No erythema or rash.     Comments: Abrasion over the right knee  Neurological:     Mental Status: She is alert.     Coordination: Coordination normal.  Psychiatric:        Behavior: Behavior normal.     ED Results / Procedures / Treatments   Labs (all labs ordered are listed, but only abnormal  results are displayed) Labs Reviewed  HEPARIN LEVEL (UNFRACTIONATED) - Abnormal; Notable for the following components:      Result Value   Heparin Unfractionated >2.20 (*)    All other components within normal limits  BRAIN NATRIURETIC PEPTIDE - Abnormal; Notable for the following components:   B Natriuretic Peptide 476.7 (*)    All other components within normal limits  D-DIMER, QUANTITATIVE (NOT AT [facility]) - Abnormal; Notable for the following components:   D-Dimer, Quant 0.63 (*)    All other components within normal limits  APTT - Abnormal; Notable for the following components:   aPTT 66 (*)    All other components within normal limits  HEPARIN LEVEL (UNFRACTIONATED) - Abnormal; Notable for the following components:   Heparin Unfractionated 1.58 (*)    All other components within normal limits  BASIC METABOLIC PANEL - Abnormal; Notable for the following components:   Potassium 3.2 (*)    Glucose, Bld 139 (*)    GFR calc non Af Amer 55 (*)    All other components within normal limits  BASIC METABOLIC PANEL - Abnormal; Notable for the following components:   Glucose, Bld 130 (*)    GFR calc non Af Amer 57 (*)    All other components within normal limits  APTT - Abnormal; Notable for the following components:   aPTT 78 (*)    All other components within normal limits  CBC - Abnormal; Notable for the following components:   WBC 10.7 (*)    All other components within normal limits  BASIC METABOLIC PANEL - Abnormal; Notable for the following components:   Glucose, Bld 121 (*)    BUN 28 (*)    GFR calc non Af Amer 57 (*)    All other components within normal limits  APTT - Abnormal; Notable for the following  components:   aPTT 66 (*)    All other components within normal limits  BASIC METABOLIC PANEL - Abnormal; Notable for the following components:   Potassium 3.4 (*)    Glucose, Bld 108 (*)    BUN 24 (*)    GFR calc non Af Amer 59 (*)    All other components within normal  limits  CBC - Abnormal; Notable for the following components:   RBC 3.73 (*)    nRBC 0.3 (*)    All other components within normal limits  I-STAT CHEM 8, ED - Abnormal; Notable for the following components:   Glucose, Bld 147 (*)    Calcium, Ion 1.14 (*)    All other components within normal limits  TROPONIN I (HIGH SENSITIVITY) - Abnormal; Notable for the following components:   Troponin I (High Sensitivity) 23,357 (*)    All other components within normal limits  TROPONIN I (HIGH SENSITIVITY) - Abnormal; Notable for the following components:   Troponin I (High Sensitivity) >27,000.0 (*)    All other components within normal limits  RESPIRATORY PANEL BY RT PCR (FLU A&B, COVID)  SARS CORONAVIRUS 2 (TAT 6-24 HRS)  CBC WITH DIFFERENTIAL/PLATELET  APTT  MAGNESIUM  LIPID PANEL  CBC  HEPARIN LEVEL (UNFRACTIONATED)  CBC  HEPARIN LEVEL (UNFRACTIONATED)    EKG EKG Interpretation  Date/Time:  Friday April 08 2019 13:35:11 EST Ventricular Rate:  101 PR Interval:    QRS Duration: 87 QT Interval:  323 QTC Calculation: 419 R Axis:   -30 Text Interpretation: Atrial fibrillation Left axis deviation Confirmed by Benjiman Core [phone]) on 04/08/2019 1:38:53 PM   Radiology No results found.  Procedures .Critical Care Performed by: Eber Hong, MD Authorized by: Eber Hong, MD   Critical care provider statement:    Critical care time (minutes):  35   Critical care time was exclusive of:  Separately billable procedures and treating other patients and teaching time   Critical care was necessary to treat or prevent imminent or life-threatening deterioration of the following conditions:  Cardiac failure   Critical care was time spent personally by me on the following activities:  Blood draw for specimens, development of treatment plan with patient or surrogate, discussions with consultants, evaluation of patient's response to treatment, examination of patient, obtaining history  from patient or surrogate, ordering and performing treatments and interventions, ordering and review of laboratory studies, ordering and review of radiographic studies, pulse oximetry, re-evaluation of patient's condition and review of old charts   (including critical care time)  Medications Ordered in ED Medications - No data to display  ED Course  I have reviewed the triage vital signs and the nursing notes.  Pertinent labs & imaging results that were available during my care of the patient were reviewed by me and considered in my medical decision making (see chart for details).    MDM Rules/Calculators/A&P                      This patient has what appears to be atrial fibrillation on her EKG.  Pulses ranged between 101 120.  I do see some abnormal ST segments in leads V3 and V4, on old EKGs there was not abnormalities in the same leads.  I have consulted cardiology pager at 201  The patient's primary cardiologist is Dr. Jens Som who sees her for the atrial fibrillation.  The patient has had a full complement of aspirin prehospital, she will get some Nitropaste, she is  hypertensive at 150/118  Having ongoing mild pain, discussed with cardiology who will see the patient in the ED.  Labs pending  This patient has a troponin that was significantly elevated in the tens of thousands.  Ultimately this appeared to be a non-ST elevation MI.  The patient is critically ill and admitted to the hospital.  Final Clinical Impression(s) / ED Diagnoses Final diagnoses:  NSTEMI      Eber Hong, MD 04/12/19 [phone]

## 2019-04-08 NOTE — ED Notes (Signed)
Pt requesting oxygen "to help get a better breath" 1L [place] placed for comfort

## 2019-04-08 NOTE — ED Notes (Signed)
ECHO at bedside.

## 2019-04-08 NOTE — ED Notes (Signed)
Main lab to add on APTT and heparin level

## 2019-04-08 NOTE — H&P (Addendum)
Cardiology Consultation:   Patient ID: Ann Booth MRN: [id number]; DOB: November 17, 1926  Admit date: 04/08/2019 Date of Consult: 04/08/2019  Primary Care Provider: Corwin Levins, MD Primary Cardiologist: No primary care provider on file.  Primary Electrophysiologist:  None   Chief complaint: chest pain  Patient Profile:   Ann Booth is a 86 y.o. female with a hx of persistent atrial fibrillation on Xarelto, moderate MR, moderate to severe TR, chronic diastolic CHF (preserved EF 2019), hypertension, hyperlipidemia, and  anxiety who is being seen today for the evaluation of chest pain at the request of Dr. Hyacinth Meeker.  History of Present Illness:   Ms. Hagg followed by Dr. Jens Som.  Monitor in August 2014 showed paroxysmal atrial fibrillation.  Toprol previously discontinued due to bradycardia.  Is currently rate controlled with Cardizem. Echo November 2019 showed normal LV, prolapse of anterior mitral valve leaflet with moderate mitral regurg, severe left atrial enlargement, moderate severe tricuspid regurgitation. She has reported falls over the last couple years. Was seen in the ER in May 2020 with chest pain that was atypical. Enzymes negative.  An MRI April 2020 showed possible subacute small ischemic infarct.  The patient was last seen 07/09/2018 by Dr. Jens Som reported orthopnea and cough while lying flat. Pedal edema noted on exam. Her Lasix was increased a couple days.   Patient presented to the ED 04/08/2019 by ambulance for chest pain.  She woke up this morning and did she had chest pain.  It is centralized and pressure-like. 8 /10 and radiating to the right side of her chest and into her back.  She has associated shortness of breath.  No nausea or vomiting or diaphoresis.  pain lasted for at least 3 hours before EMS was called.  Her family member who was a Engineer, civil (consulting) called EMS.  No recent fever, chills, illnesses.  Denies recent leg swelling or orthopnea.  She takes Lasix as needed for  volume control.   In the ED blood pressure 150/118, pulse 112, afebrile, respiratory rate 25, 96% O2.  EKG noted A. fib rate of 101 bpm.  Labs showed potassium 3.5, glucose 147, creatinine 0.60.  WBC 6.1, hemoglobin 13.7.  Covid negative.  Chest x-ray showed acute edema or inflammation cannot be excluded. EKG showed Afib with ST elevations in V3 and V4  Heart Pathway Score:  HEAR Score: 6  Past Medical History:  Diagnosis Date  . ALLERGIC RHINITIS   . ANXIETY   . ASTHMA   . Atrial fibrillation (HCC)   . BACK PAIN   . Carotid stenosis 04/27/2012   Mild left on community screening  - Nov 2013  . CATARACT NOS   . DEPRESSION   . GLAUCOMA NOS   . HTN (hypertension)   . Hypercalcemia   . MITRAL VALVE PROLAPSE   . OSTEOPOROSIS   . PERIPHERAL NEUROPATHY   . POSITIONAL VERTIGO   . Rosacea     Past Surgical History:  Procedure Laterality Date  .  ankle surgury     left ankle  . CATARACT EXTRACTION, BILATERAL    . HEMORRHOID SURGERY    . posterior chamber interocular lens implant       Home Medications:  Prior to Admission medications   Medication Sig Start Date End Date Taking? Authorizing Provider  ALPRAZolam (XANAX) 0.5 MG tablet 1/2 - 1 tab by mouth at bedtime as needed Patient taking differently: Take 0.25-0.5 mg by mouth 2 (two) times daily.  03/16/17   Corwin Levins, MD  citalopram (CELEXA) 10 MG tablet Take 1 tablet (10 mg total) by mouth daily. 03/22/19 03/21/20  Biagio Borg, MD  diltiazem (CARDIZEM) 60 MG tablet Take 1 tablet (60 mg total) by mouth every 8 (eight) hours for 30 days. Patient taking differently: Take 60 mg by mouth every 8 (eight) hours. Pt was ordered to break tab in half and take 4 times a day 07/04/18 08/03/18  Donne Hazel, MD  donepezil (ARICEPT) 5 MG tablet Take 1 tablet (5 mg total) by mouth at bedtime. 03/22/19   Biagio Borg, MD  FLUZONE HIGH-DOSE QUADRIVALENT 0.7 ML SUSY  11/17/18   [provider]  furosemide (LASIX) 20 MG tablet TAKE 1  TABLET BY MOUTH ONCE DAILY AS NEEDED FOR SWELLING Patient taking differently: Take 20 mg by mouth daily as needed for fluid.  03/29/18   Lelon Perla, MD  lidocaine (LIDODERM) 5 % Place 1 patch onto the skin daily. Remove & Discard patch within 12 hours or as directed by MD Patient not taking: Reported on 07/09/2018 10/05/17   Biagio Borg, MD  losartan (COZAAR) 50 MG tablet Take 50 mg by mouth daily. 03/10/19   [provider]  Rivaroxaban (XARELTO) 15 MG TABS tablet Take 1 tablet (15 mg total) by mouth daily with supper. 11/03/18   Lelon Perla, MD  timolol (BETIMOL) 0.5 % ophthalmic solution Place 1 drop into both eyes 2 (two) times daily.     [provider]  traMADol (ULTRAM) 50 MG tablet Take 1 tablet (50 mg total) by mouth every 6 (six) hours as needed. Patient not taking: Reported on 07/09/2018 06/17/18   Biagio Borg, MD    Inpatient Medications: Scheduled Meds:  Continuous Infusions:  PRN Meds:   Allergies:    Allergies  Allergen Reactions  . Cymbalta [Duloxetine Hcl] Other (See Comments)    Social History:   Social History   Socioeconomic History  . Marital status: Widowed    Spouse name: Not on file  . Number of children: 0  . Years of education: Not on file  . Highest education level: Not on file  Occupational History  . Occupation: retired Designer, industrial/product: RETIRED  Tobacco Use  . Smoking status: Never Smoker  . Smokeless tobacco: Never Used  Substance and Sexual Activity  . Alcohol use: No  . Drug use: No  . Sexual activity: Never  Other Topics Concern  . Not on file  Social History Narrative  . Not on file   Social Determinants of Health   Financial Resource Strain:   . Difficulty of Paying Living Expenses: Not on file  Food Insecurity:   . Worried About Charity fundraiser in the Last Year: Not on file  . Ran Out of Food in the Last Year: Not on file  Transportation Needs:   . Lack of Transportation (Medical): Not on  file  . Lack of Transportation (Non-Medical): Not on file  Physical Activity:   . Days of Exercise per Week: Not on file  . Minutes of Exercise per Session: Not on file  Stress:   . Feeling of Stress : Not on file  Social Connections:   . Frequency of Communication with Friends and Family: Not on file  . Frequency of Social Gatherings with Friends and Family: Not on file  . Attends Religious Services: Not on file  . Active Member of Clubs or Organizations: Not on file  . Attends Club or  Organization Meetings: Not on file  . Marital Status: Not on file  Intimate Partner Violence:   . Fear of Current or Ex-Partner: Not on file  . Emotionally Abused: Not on file  . Physically Abused: Not on file  . Sexually Abused: Not on file    Family History:   Family History  Problem Relation Age of Onset  . CAD Mother        MI at age 71  . Cancer Sister        colon  . Cancer Brother        prostate cancer     ROS:  Please see the history of present illness.  All other ROS reviewed and negative.     Physical Exam/Data:   Vitals:   04/08/19 1515 04/08/19 1530 04/08/19 1600 04/08/19 1615  BP: (!) 145/112 (!) 135/107 (!) 140/99 (!) 139/94  Pulse: 77 61 90 99  Resp: 20 12 (!) 23 16  Temp:      TempSrc:      SpO2: 99% 95% 98% 100%   No intake or output data in the 24 hours ending 04/08/19 1644 Last 3 Weights 03/22/2019 07/09/2018 07/04/2018  Weight (lbs) 119 lb 121 lb 121 lb 12.8 oz  Weight (kg) 53.978 kg 54.885 kg 55.248 kg     There is no height or weight on file to calculate BMI.  General:  Well nourished, well developed, in no acute distress HEENT: normal Lymph: no adenopathy Neck: no JVD Endocrine:  No thryomegaly Vascular: No carotid bruits; FA pulses 2+ bilaterally without bruits  Cardiac:  normal S1, S2; RRR; systolic murmur  Lungs:  Crackles at bases  Abd: soft, nontender, no hepatomegaly  Ext: no edema Musculoskeletal:  No deformities, BUE and BLE strength normal and  equal Skin: warm and dry  Neuro:  CNs 2-12 intact, no focal abnormalities noted Psych:  Normal affect   EKG:  The EKG was personally reviewed and demonstrates: A. fib 101 bpm, STE V3 and V4   Relevant CV Studies:  Echo 12/2017  - Left ventricle: The cavity size was normal. Wall thickness was  increased in a pattern of mild LVH. Systolic function was normal.  The estimated ejection fraction was in the range of 55% to 60%.  Wall motion was normal; there were no regional wall motion  abnormalities.  - Mitral valve: Mild prolapse, involving the anterior leaflet.  There was moderate regurgitation.  - Left atrium: The atrium was severely dilated.  - Tricuspid valve: There was moderate-severe regurgitation.  - Pulmonary arteries: Systolic pressure was mildly increased. PA  peak pressure: 37 mm Hg (S).   Laboratory Data:  High Sensitivity Troponin:   Recent Labs  Lab 04/08/19 1424  TROPONINIHS 23,357*     Chemistry Recent Labs  Lab 04/08/19 1446  NA 139  K 3.5  CL 104  GLUCOSE 147*  BUN 17  CREATININE 0.60    No results for input(s): PROT, ALBUMIN, AST, ALT, ALKPHOS, BILITOT in the last 168 hours. Hematology Recent Labs  Lab 04/08/19 1424 04/08/19 1446  WBC 6.1  --   RBC 4.26  --   HGB 13.7 14.3  HCT 41.2 42.0  MCV 96.7  --   MCH 32.2  --   MCHC 33.3  --   RDW 13.3  --   PLT 167  --    BNPNo results for input(s): BNP, PROBNP in the last 168 hours.  DDimer No results for input(s): DDIMER in the last  168 hours.   Radiology/Studies:  [facility] Chest Port 1 View  Result Date: 04/08/2019 CLINICAL DATA:  Chest pain. EXAM: PORTABLE CHEST 1 VIEW COMPARISON:  Jul 02, 2018. FINDINGS: Stable cardiomediastinal silhouette. No pneumothorax or pleural effusion is noted. Stable interstitial densities are noted most consistent with scarring. Acute superimposed edema or inflammation cannot be excluded. Bony thorax is unremarkable. IMPRESSION: Stable interstitial densities  are noted most consistent with scarring, but acute superimposed edema or inflammation cannot be excluded. Electronically Signed   By: Lupita Raider M.D.   On: 04/08/2019 14:59    TIMI Risk Score for Unstable Angina or Non-ST Elevation MI:   The patient's TIMI risk score is 5, which indicates a 26% risk of all cause mortality, new or recurrent myocardial infarction or need for urgent revascularization in the next 14 days.   Assessment and Plan:   Likely STEMI Presented to the ED for chest pain upon awakening 8 out of 10 pressure-like with associated shortness of breath, at times radiating into her back. HS troponin 23,357. EKG shows afib with ST elevations in V3 and V4. CXR with possible edema vs inflammation. The patient is still having chest pain on my exam. -Admit to cardiology -IV heparin and IV NTG - continue to trend troponin -She is on Xarelto for A. Fib. Last dose was last night -Not on a statin baseline. Check lipids -Start Metoprolol 25mg  q6 hours for rate control of AF -Repeat echo -IV lasix 40 mg  Chronic diastolic heart failure -EF preserved in 2019 - repeat echo -She uses Lasix 20 daily at home as needed -Chest x-ray with possible acute edema -Check BNP -Euvolemic on exam  Persistent Paroxysmal A. Fib -Xarelto for stroke prophylaxis>>covered with heparin -Rate controlled with diltiazem>>hold and start BB  Hypertension -She takes Cardizem 60 every 8 hours in losartan 50 daily>>hold given systolic dysfunction - Pressure mildly elevated - BB as above  Hyperlipidemia -Not on medication at baseline  Moderate MR/moderate to severe TR -Per echo 2019 -Plan for conservative management given patient's age and medical condition   For questions or updates, please contact [facility] Please consult [email or web address] for contact info under    Signed, Cadence 2020, PA-C  04/08/2019 4:44 PM    Patient seen and examined.  Agree with above documentation.  Ms Dawe  is a 86 year old female with chronic atrial fibrillation on Xarelto, moderate MR, moderate to severe TR, HFpEF, HTN, HLD who presents with chest pain.  Reports chest pain started when she awoke around 8AM this morning.  Describes substernal chest pressure.  Reports pain did not improve, prompting her to come to ED.  In the ED, initial EKG shows Q-waves in V1-3 with ~36mm ST elevation in V3/4.  Initial troponin returned at 23,357.  Cardiology was called for evaluation at this point.  On review of initial EKG, I was concerned for STEMI and spoke with interventional cardiology about proceeding with cath.  Given late onset from symptoms and considering age and patient took Xarelto last night, recommended against catheterization at this time and instead pursuing medical management.  Second troponin >27,000.  TTE personally reviewed, EF 30-35%, anterior akinesis.  On exam, patient is alert and oriented, tachycardic, irregular rhythm, no murmurs, lungs with bibasilar crackles, no LE edema, +JVD.  No beds in 2H/2C, admitted to 6E.  Started patient on heparin gtt.  Given IV morphine and will start nitro gtt and titrate until chest pain free.  Appears hypervolemic, will diurese with IV  lasix.  For her atrial fibrillation, will try to slow down rates as may help with ischemia.  Will start metoprolol 25 mg every 6 hours.  Given patient's age and comorbidities and presentation with large anterior MI, there is a poor prognosis.  Discussed code status with patient and she confirmed that she is DNR. I did discuss patient with on call physician so can be monitored closely overnight.  Little Ishikawa, MD

## 2019-04-08 NOTE — Progress Notes (Signed)
Spoke With Cards fellow regarding Nitro infusion. Pt is currently chest pain free and BP has improved. Will hold Nitro infusion for now.

## 2019-04-09 DIAGNOSIS — I214 Non-ST elevation (NSTEMI) myocardial infarction: Secondary | ICD-10-CM

## 2019-04-09 LAB — CBC
HCT: 41.5 % (ref 36.0–46.0)
Hemoglobin: 13.9 g/dL (ref 12.0–15.0)
MCH: 32.5 pg (ref 26.0–34.0)
MCHC: 33.5 g/dL (ref 30.0–36.0)
MCV: 97 fL (ref 80.0–100.0)
Platelets: 173 10*3/uL (ref 150–400)
RBC: 4.28 MIL/uL (ref 3.87–5.11)
RDW: 13.5 % (ref 11.5–15.5)
WBC: 7.6 10*3/uL (ref 4.0–10.5)
nRBC: 0 % (ref 0.0–0.2)

## 2019-04-09 LAB — BASIC METABOLIC PANEL
Anion gap: 13 (ref 5–15)
BUN: 16 mg/dL (ref 8–23)
CO2: 25 mmol/L (ref 22–32)
Calcium: 9.2 mg/dL (ref 8.9–10.3)
Chloride: 102 mmol/L (ref 98–111)
Creatinine, Ser: 0.91 mg/dL (ref 0.44–1.00)
GFR calc Af Amer: >60 mL/min (ref >60)
GFR calc non Af Amer: 55 mL/min — ABNORMAL LOW (ref >60)
Glucose, Bld: 139 mg/dL — ABNORMAL HIGH (ref 70–99)
Potassium: 3.2 mmol/L — ABNORMAL LOW (ref 3.5–5.1)
Sodium: 140 mmol/L (ref 135–145)

## 2019-04-09 LAB — LIPID PANEL
Cholesterol: 150 mg/dL (ref 0–200)
HDL: 79 mg/dL (ref >40)
LDL Cholesterol: 63 mg/dL (ref 0–99)
Total CHOL/HDL Ratio: 1.9 RATIO
Triglycerides: 40 mg/dL (ref <150)
VLDL: 8 mg/dL (ref 0–40)

## 2019-04-09 LAB — HEPARIN LEVEL (UNFRACTIONATED): Heparin Unfractionated: 1.58 IU/mL — ABNORMAL HIGH (ref 0.30–0.70)

## 2019-04-09 LAB — APTT: aPTT: 66 seconds — ABNORMAL HIGH (ref 24–36)

## 2019-04-09 MED ORDER — POTASSIUM CHLORIDE CRYS ER 20 MEQ PO TBCR
40.0000 meq | EXTENDED_RELEASE_TABLET | Freq: Once | ORAL | Status: AC
  Administered 2019-04-09: 09:00:00 40 meq via ORAL
  Filled 2019-04-09: qty 2

## 2019-04-09 MED ORDER — ATORVASTATIN CALCIUM 40 MG PO TABS
40.0000 mg | ORAL_TABLET | Freq: Every day | ORAL | Status: DC
  Administered 2019-04-09 – 2019-04-12 (×4): 40 mg via ORAL
  Filled 2019-04-09 (×4): qty 1

## 2019-04-09 MED ORDER — POTASSIUM CHLORIDE CRYS ER 20 MEQ PO TBCR
40.0000 meq | EXTENDED_RELEASE_TABLET | Freq: Two times a day (BID) | ORAL | Status: DC
  Administered 2019-04-09 (×2): 40 meq via ORAL
  Filled 2019-04-09 (×2): qty 2

## 2019-04-09 MED ORDER — FUROSEMIDE 10 MG/ML IJ SOLN
40.0000 mg | Freq: Two times a day (BID) | INTRAMUSCULAR | Status: DC
  Administered 2019-04-09 (×2): 40 mg via INTRAVENOUS
  Filled 2019-04-09 (×2): qty 4

## 2019-04-09 NOTE — Progress Notes (Addendum)
Progress Note  Patient Name: Ann Booth Date of Encounter: 04/09/2019  Primary Cardiologist:  Olga Millers, MD  Subjective   Pt currently denies CP, +SOB  Inpatient Medications    Scheduled Meds:  aspirin  324 mg Oral NOW   Or   aspirin  300 mg Rectal NOW   aspirin EC  81 mg Oral Daily   citalopram  10 mg Oral Daily   donepezil  5 mg Oral QHS   metoprolol tartrate  25 mg Oral Q6H   sodium chloride flush  3 mL Intravenous Q12H   Continuous Infusions:  sodium chloride     heparin 650 Units/hr (04/08/19 2053)   nitroGLYCERIN 10 mcg/min (04/09/19 0812)   PRN Meds: sodium chloride, acetaminophen, ALPRAZolam, nitroGLYCERIN, ondansetron (ZOFRAN) IV, sodium chloride flush, zolpidem   Vital Signs    Vitals:   04/08/19 1858 04/08/19 2147 04/08/19 2241 04/09/19 0557  BP: (!) 123/99 130/87  (!) 134/97  Pulse: 99 (!) 108  81  Resp:  16  16  Temp: 97.9 F (36.6 C) 98 F (36.7 C)  98 F (36.7 C)  TempSrc: Axillary Axillary  Oral  SpO2: 99% 99%  98%  Weight:   53.9 kg 52 kg  Height:   5\' 2"  (1.575 m)     Intake/Output Summary (Last 24 hours) at 04/09/2019 04/11/2019 Last data filed at 04/09/2019 0500 Gross per 24 hour  Intake 288.86 ml  Output --  Net 288.86 ml   Filed Weights   04/08/19 2241 04/09/19 0557  Weight: 53.9 kg 52 kg   Last Weight  Most recent update: 04/09/2019  5:58 AM    Weight  52 kg (114 lb 10.2 oz)            Weight change:    Telemetry    Atrial fib - Personally Reviewed  ECG    02/13 ECG is atrial fib, HR 84, +ST elevation V 4-5, worse than 02/12 ECGs - Personally Reviewed  Physical Exam   General: Well developed, frail, elderly, female appearing in no acute distress. Head: Normocephalic, atraumatic.  Neck: Supple without bruits, JVD to jaw. Lungs:  Resp regular and unlabored, bilateral rales. Heart: Irreg R&R, S1, S2, no S3, S4, or murmur; no rub. Abdomen: Soft, non-tender, non-distended with normoactive bowel sounds. No  hepatomegaly. No rebound/guarding. No obvious abdominal masses. Extremities: No clubbing, cyanosis, no edema. Distal pedal pulses are 2+ bilaterally. Neuro: Alert and oriented X 3. Moves all extremities spontaneously. Psych: Normal affect.  Labs    Hematology Recent Labs  Lab 04/08/19 1424 04/08/19 1446 04/09/19 0424  WBC 6.1  --  7.6  RBC 4.26  --  4.28  HGB 13.7 14.3 13.9  HCT 41.2 42.0 41.5  MCV 96.7  --  97.0  MCH 32.2  --  32.5  MCHC 33.3  --  33.5  RDW 13.3  --  13.5  PLT 167  --  173    Chemistry Recent Labs  Lab 04/08/19 1446 04/09/19 0424  NA 139 140  K 3.5 3.2*  CL 104 102  CO2  --  25  GLUCOSE 147* 139*  BUN 17 16  CREATININE 0.60 0.91  CALCIUM  --  9.2  GFRNONAA  --  55*  GFRAA  --  >60  ANIONGAP  --  13     High Sensitivity Troponin:   Recent Labs  Lab 04/08/19 1424 04/08/19 1653  TROPONINIHS 23,357* >27,000.0*      BNP Recent Labs  Lab 04/08/19 1653  BNP 476.7*     DDimer  Recent Labs  Lab 04/08/19 1653  DDIMER 0.63*    Lab Results  Component Value Date   CHOL 150 04/09/2019   HDL 79 04/09/2019   LDLCALC 63 04/09/2019   TRIG 40 04/09/2019   CHOLHDL 1.9 04/09/2019    Radiology    DG Chest Port 1 View  Result Date: 04/08/2019 CLINICAL DATA:  Chest pain. EXAM: PORTABLE CHEST 1 VIEW COMPARISON:  Jul 02, 2018. FINDINGS: Stable cardiomediastinal silhouette. No pneumothorax or pleural effusion is noted. Stable interstitial densities are noted most consistent with scarring. Acute superimposed edema or inflammation cannot be excluded. Bony thorax is unremarkable. IMPRESSION: Stable interstitial densities are noted most consistent with scarring, but acute superimposed edema or inflammation cannot be excluded. Electronically Signed   By: Lupita Raider M.D.   On: 04/08/2019 14:59   ECHOCARDIOGRAM COMPLETE  Result Date: 04/08/2019    ECHOCARDIOGRAM REPORT   Patient Name:   Ann Booth Date of Exam: 04/08/2019 Medical Rec #:   [id number]        Height:       62.0 in Accession #:    [id number]       Weight:       119.0 lb Date of Birth:  1926-06-28         BSA:          1.53 m Patient Age:    86 years         BP:           144/114 mmHg Patient Gender: F                HR:           103 bpm. Exam Location:  Inpatient Procedure: 2D Echo Indications:    NSTEMI I21.4  History:        Patient has prior history of Echocardiogram examinations, most                 recent 12/31/2017. CHF, Mitral Valve Disease, Arrythmias:Atrial                 Fibrillation; Risk Factors:Dyslipidemia and Hypertension.  Sonographer:    Ross Ludwig RDCS (AE) Referring Phys: [id number] CHRISTOPHER L SCHUMANN IMPRESSIONS  1. Left ventricular ejection fraction, by estimation, is 30 to 35%. The left ventricle has moderate to severely decreased function. The left ventricle demonstrates regional wall motion abnormalities (see scoring diagram/findings for description). There is mildly increased left ventricular hypertrophy. Left ventricular diastolic parameters are indeterminate.  2. Right ventricular systolic function is moderately reduced. The right ventricular size is normal. There is severely elevated pulmonary artery systolic pressure. The estimated right ventricular systolic pressure is 73.7 mmHg.  3. Left atrial size was severely dilated.  4. Right atrial size was severely dilated.  5. The mitral valve is degenerative. Moderate to severe mitral valve regurgitation.  6. The tricuspid valve is abnormal. Tricuspid valve regurgitation is moderate to severe.  7. The aortic valve is tricuspid. Aortic valve regurgitation is not visualized. Mild to moderate aortic valve sclerosis/calcification is present, without any evidence of aortic stenosis.  8. The inferior vena cava is dilated in size with <50% respiratory variability, suggesting right atrial pressure of 15 mmHg. FINDINGS  Left Ventricle: Left ventricular ejection fraction, by estimation, is 30 to 35%. The left ventricle has  moderate to severely decreased function. The left ventricle demonstrates regional wall motion abnormalities. There is mildly  increased left ventricular hypertrophy. Left ventricular diastolic parameters are indeterminate.  LV Wall Scoring: The mid and distal anterior wall, mid and distal anterior septum, mid and distal inferior wall, mid inferoseptal segment, and apex are akinetic. The basal anteroseptal segment and basal anterior segment are hypokinetic. The entire lateral wall, basal inferior segment, and basal inferoseptal segment are normal. Right Ventricle: The right ventricular size is normal. No increase in right ventricular wall thickness. Right ventricular systolic function is moderately reduced. There is severely elevated pulmonary artery systolic pressure. The tricuspid regurgitant velocity is 3.83 m/s, and with an assumed right atrial pressure of 15 mmHg, the estimated right ventricular systolic pressure is 73.7 mmHg. Left Atrium: Left atrial size was severely dilated. Right Atrium: Right atrial size was severely dilated. Pericardium: There is no evidence of pericardial effusion. Mitral Valve: The mitral valve is degenerative in appearance. Moderate to severe mitral valve regurgitation. Tricuspid Valve: The tricuspid valve is abnormal. Tricuspid valve regurgitation is moderate to severe. Aortic Valve: The aortic valve is tricuspid. Aortic valve regurgitation is not visualized. Mild to moderate aortic valve sclerosis/calcification is present, without any evidence of aortic stenosis. Pulmonic Valve: The pulmonic valve was not well visualized. Pulmonic valve regurgitation is not visualized. Aorta: The aortic root is normal in size and structure. Venous: The inferior vena cava is dilated in size with less than 50% respiratory variability, suggesting right atrial pressure of 15 mmHg. IAS/Shunts: The interatrial septum was not well visualized.  LEFT VENTRICLE PLAX 2D LVIDd:         3.64 cm LVIDs:         2.53  cm LV PW:         1.22 cm LV IVS:        1.21 cm LVOT diam:     1.80 cm LV SV:         23.51 ml LV SV Index:   21.36 LVOT Area:     2.54 cm  RIGHT VENTRICLE            IVC RV Basal diam:  2.79 cm    IVC diam: 1.95 cm RV S prime:     7.83 cm/s TAPSE (M-mode): 1.2 cm LEFT ATRIUM             Index       RIGHT ATRIUM           Index LA diam:        4.10 cm 2.67 cm/m  RA Area:     22.10 cm LA Vol (A2C):   87.2 ml 56.88 ml/m RA Volume:   69.20 ml  45.14 ml/m LA Vol (A4C):   75.9 ml 49.51 ml/m LA Biplane Vol: 85.2 ml 55.57 ml/m  AORTIC VALVE LVOT Vmax:   60.96 cm/s LVOT Vmean:  40.500 cm/s LVOT VTI:    0.092 m  AORTA Ao Root diam: 2.60 cm Ao Asc diam:  3.00 cm MR Peak grad:    96.8 mmHg   TRICUSPID VALVE MR Mean grad:    66.0 mmHg   TR Peak grad:   58.7 mmHg MR Vmax:         492.00 cm/s TR Vmax:        383.00 cm/s MR Vmean:        390.0 cm/s MR PISA:         5.09 cm    SHUNTS MR PISA Eff ROA: 33 mm      Systemic VTI:  0.09 m MR PISA Radius:  0.90 cm     Systemic Diam: 1.80 cm Oswaldo Milian MD Electronically signed by Oswaldo Milian MD Signature Date/Time: 04/08/2019/8:57:24 PM    Final      Cardiac Studies   ECHO:  02/12  1. Left ventricular ejection fraction, by estimation, is 30 to 35%. The  left ventricle has moderate to severely decreased function. The left  ventricle demonstrates regional wall motion abnormalities (see scoring  diagram/findings for description). There  is mildly increased left ventricular hypertrophy. Left ventricular  diastolic parameters are indeterminate.   2. Right ventricular systolic function is moderately reduced. The right  ventricular size is normal. There is severely elevated pulmonary artery  systolic pressure. The estimated right ventricular systolic pressure is  24.2 mmHg.   3. Left atrial size was severely dilated.   4. Right atrial size was severely dilated.   5. The mitral valve is degenerative. Moderate to severe mitral valve  regurgitation.    6. The tricuspid valve is abnormal. Tricuspid valve regurgitation is  moderate to severe.   7. The aortic valve is tricuspid. Aortic valve regurgitation is not  visualized. Mild to moderate aortic valve sclerosis/calcification is  present, without any evidence of aortic stenosis.   8. The inferior vena cava is dilated in size with <50% respiratory  variability, suggesting right atrial pressure of 15 mmHg.   Patient Profile     86 y.o. female w/ hx persistent atrial fibrillation on Xarelto, moderate MR, moderate to severe TR, chronic diastolic CHF (preserved EF 2019), hypertension, hyperlipidemia, and  anxiety who was admitted 02/12 for chest pain   Assessment & Plan    1. NSTEMI - ECG today may meet STEMI criteria, but decision has been made for med rx - increase IV Nitro>done - add Lipitor 40 mg, continue BB, ASA - monitor for pain - because of Xarelto for Afib, not on DAPT  2. Acute systolic CHF - EF 35-36%, RA pressure 15 mmHg, BNP elevated - +volume overload by exam - pt got Lasix 40 mg IV yesterday, continue this bid for now - supp K+, give 40 meq bid w/ one extra dose today  3. Perm Afib - pta on Cardizem, d/c'd - now on metoprolol 25 mg q 6 hr - HR generally controlled - Xarelto held, currently on heparin   Active Problems:   NSTEMI (non-ST elevated myocardial infarction) ([place])    Signed, Rosaria Ferries , PA-C 8:26 AM 04/09/2019 Pager: [phone]  Patient examined chart reviewed No angina. Afib rate a bit fast on lopressor cardizem d/c due to MI continue heparin and resume xarelto ? 48 hours Has had anterior MI ECG this am with persistent ST elevation Decision made to not cath given age No signs CHF no murmur on exam OOB to chair  Jenkins Rouge MD [facility]

## 2019-04-09 NOTE — Progress Notes (Signed)
Pt observed dangling her legs in bed multiple times. Had also noted that her toes were mottled with her right toes more purplish than her left toes. She did say on questioning that they don't hurt but " feels like they are on fire ".  Toes are cool to touch but not tender.Will update MD accordingly.

## 2019-04-09 NOTE — Progress Notes (Signed)
ANTICOAGULATION CONSULT NOTE  Pharmacy Consult for heparin Indication: chest pain/ACS  Allergies  Allergen Reactions  . Cymbalta [Duloxetine Hcl] Other (See Comments)    Patient Measurements: Height: 5\' 2"  (157.5 cm) Weight: 118 lb 13.3 oz (53.9 kg) IBW/kg (Calculated) : 50.1 Heparin Dosing Weight: 54 kg  Vital Signs: Temp: 98 F (36.7 C) (02/12 2147) Temp Source: Axillary (02/12 2147) BP: 130/87 (02/12 2147) Pulse Rate: 108 (02/12 2147)  Labs: Recent Labs    04/08/19 1424 04/08/19 1446 04/08/19 1540 04/08/19 1653 04/09/19 0215  HGB 13.7 14.3  --   --   --   HCT 41.2 42.0  --   --   --   PLT 167  --   --   --   --   APTT  --   --  33  --  66*  HEPARINUNFRC  --   --  >2.20*  --   --   CREATININE  --  0.60  --   --   --   TROPONINIHS 23,357*  --   --  >27,000.0*  --     Estimated Creatinine Clearance: 36.2 mL/min (by C-G formula based on SCr of 0.6 mg/dL).  Assessment: 86 y.o. female with h/o Afib, Xarelto on hold, for heparin  Goal of Therapy:  Heparin level 0.3-0.7 units/ml aPTT 66-102 seconds Monitor platelets by anticoagulation protocol: Yes   Plan:  Continue Heparin at current rate   82, PharmD, BCPS

## 2019-04-10 LAB — BASIC METABOLIC PANEL
Anion gap: 13 (ref 5–15)
BUN: 21 mg/dL (ref 8–23)
CO2: 27 mmol/L (ref 22–32)
Calcium: 9.5 mg/dL (ref 8.9–10.3)
Chloride: 102 mmol/L (ref 98–111)
Creatinine, Ser: 0.89 mg/dL (ref 0.44–1.00)
GFR calc Af Amer: >60 mL/min (ref >60)
GFR calc non Af Amer: 57 mL/min — ABNORMAL LOW (ref >60)
Glucose, Bld: 130 mg/dL — ABNORMAL HIGH (ref 70–99)
Potassium: 4.8 mmol/L (ref 3.5–5.1)
Sodium: 142 mmol/L (ref 135–145)

## 2019-04-10 LAB — CBC
HCT: 40.1 % (ref 36.0–46.0)
Hemoglobin: 13.1 g/dL (ref 12.0–15.0)
MCH: 32.3 pg (ref 26.0–34.0)
MCHC: 32.7 g/dL (ref 30.0–36.0)
MCV: 98.8 fL (ref 80.0–100.0)
Platelets: 156 10*3/uL (ref 150–400)
RBC: 4.06 MIL/uL (ref 3.87–5.11)
RDW: 13.8 % (ref 11.5–15.5)
WBC: 10.7 10*3/uL — ABNORMAL HIGH (ref 4.0–10.5)
nRBC: 0 % (ref 0.0–0.2)

## 2019-04-10 LAB — APTT: aPTT: 78 seconds — ABNORMAL HIGH (ref 24–36)

## 2019-04-10 LAB — HEPARIN LEVEL (UNFRACTIONATED): Heparin Unfractionated: 0.65 IU/mL (ref 0.30–0.70)

## 2019-04-10 MED ORDER — FUROSEMIDE 20 MG PO TABS
20.0000 mg | ORAL_TABLET | Freq: Every day | ORAL | Status: DC
  Administered 2019-04-10 – 2019-04-12 (×3): 20 mg via ORAL
  Filled 2019-04-10 (×3): qty 1

## 2019-04-10 MED ORDER — ISOSORBIDE DINITRATE 10 MG PO TABS
10.0000 mg | ORAL_TABLET | Freq: Two times a day (BID) | ORAL | Status: DC
  Administered 2019-04-10 – 2019-04-12 (×5): 10 mg via ORAL
  Filled 2019-04-10 (×5): qty 1

## 2019-04-10 MED ORDER — METOPROLOL TARTRATE 50 MG PO TABS
50.0000 mg | ORAL_TABLET | Freq: Two times a day (BID) | ORAL | Status: DC
  Administered 2019-04-10 – 2019-04-12 (×5): 50 mg via ORAL
  Filled 2019-04-10 (×5): qty 1

## 2019-04-10 NOTE — Progress Notes (Signed)
Cardiology [facility] Overnight Note  Received page from the patient's nurse that the daily EKG was completed and had a critical result with acute MI/STEMI.  On review of the chart, there has been concern for ST elevation and this ECG appears consistent with evolution of an anterior MI.  On my evaluation, the patient was sleeping but arousable.  She was able to state her first name but thought that she was at home.  Per the nursing staff, this is similar to her mental status throughout the evening and on prior overnight shifts, felt to be consistent with sundowning.  Discussed the patient's EKG changes with Dr. Okey Dupre, the interventional cardiologist on-call.  Discussions over the weekend with the team since her presentation on Friday have led to the assessment that an urgent/emergent heart catheterization in this frail, DNAR, elderly woman who had had a somewhat delayed presentation with likely STEMI was of greater risk than benefit.  Given this, we will continue medical management for the patient's myocardial infarction.  She is comfortable and remains hemodynamically stable.  Morning labs are pending for further evaluation.  Starlyn Skeans, MD

## 2019-04-10 NOTE — Progress Notes (Signed)
ANTICOAGULATION CONSULT NOTE  Pharmacy Consult for heparin Indication: chest pain/ACS  Allergies  Allergen Reactions  . Cymbalta [Duloxetine Hcl] Other (See Comments)    Patient Measurements: Height: 5\' 2"  (157.5 cm) Weight: 113 lb 8.6 oz (51.5 kg) IBW/kg (Calculated) : 50.1 Heparin Dosing Weight: 54 kg  Vital Signs: Temp: 97.6 F (36.4 C) (02/14 0350) Temp Source: Axillary (02/14 0350) BP: 100/70 (02/14 0816) Pulse Rate: 96 (02/14 0955)  Labs: Recent Labs    04/08/19 1424 04/08/19 1424 04/08/19 1446 04/08/19 1446 04/08/19 1540 04/08/19 1653 04/09/19 0215 04/09/19 0424 04/10/19 0359  HGB 13.7   < > 14.3   < >  --   --   --  13.9 13.1  HCT 41.2   < > 42.0  --   --   --   --  41.5 40.1  PLT 167  --   --   --   --   --   --  173 156  APTT  --   --   --   --  33  --  66*  --  78*  HEPARINUNFRC  --   --   --   --  >2.20*  --   --  1.58* 0.65  CREATININE  --   --  0.60  --   --   --   --  0.91 0.89  TROPONINIHS 23,357*  --   --   --   --  >27,000.0*  --   --   --    < > = values in this interval not displayed.    Estimated Creatinine Clearance: 32.6 mL/min (by C-G formula based on SCr of 0.89 mg/dL).  Assessment: 86 y.o. female with h/o Afib, Xarelto on hold on heparin for NSTEMI. Plans noted for medical management -heparin level at goal, CBC stable   Goal of Therapy:  Heparin level 0.3-0.7 units/ml aPTT 66-102 seconds Monitor platelets by anticoagulation protocol: Yes   Plan:  -No heparin changes needed -Will follow plans for resuming oral anticoagulation  82, PharmD Clinical Pharmacist **Pharmacist phone directory can now be found on [email or web address] ([email or web address]).  Listed under [facility].

## 2019-04-10 NOTE — Progress Notes (Signed)
Spoke to Pt niece by phone and updated her on pt condition.   Pt Nephew is her HCPOA.  They are very worried about her going home to live alone.   They say she is confused regularly, does not eat or take meds consistently.  She has fallen several times and is very weak.    Advised I would get PT consult, if she does not need rehab after her MI (unlikely), will discuss other options then.  Theodore Demark, PA-C 04/10/2019 8:54 AM

## 2019-04-10 NOTE — Progress Notes (Signed)
Heparin continues to infuse at 6.32ml/hr: 650 units/hr. Safety checks completed.

## 2019-04-10 NOTE — Evaluation (Addendum)
Physical Therapy Evaluation Patient Details Name: Ann Booth MRN: [id number] DOB: 13-Jul-1926 Today's Date: 04/10/2019   History of Present Illness  86 y.o. female w/ hx persistent atrial fibrillation on Xarelto, moderate MR, moderate to severe TR, chronic diastolic CHF (preserved EF 2019), hypertension, hyperlipidemia, and  anxiety who was admitted 02/12 for chest pain. ECG 04/09/19 may meet STEMI criteria, but decision has been made for medical management.   Clinical Impression  Prior to admission, pt lives alone, uses a cane for mobility, and is independent with ADL's. On PT evaluation, pt presents with significant debility/deconditioning, balance impairments, cognitive deficits, decreased cardiovascular endurance. Requiring maximal assist for bed mobility, moderate assist for transfers and taking side steps at edge of bed. SpO2 96-98% on RA, HR peak 107 bpm. Pt very drowsy throughout session and requiring increased time for task initiation and execution. Pt presents as a high fall risk based on deficits listed above. Recommending SNF at discharge.     Follow Up Recommendations SNF;Supervision/Assistance - 24 hour    Equipment Recommendations  3in1 (PT);Wheelchair (measurements PT)    Recommendations for Other Services       Precautions / Restrictions Precautions Precautions: Fall Restrictions Weight Bearing Restrictions: No      Mobility  Bed Mobility Overal bed mobility: Needs Assistance Bed Mobility: Supine to Sit;Sit to Supine     Supine to sit: Max assist Sit to supine: Max assist   General bed mobility comments: MaxA provided for supine <> sit due to decreased initiation  Transfers Overall transfer level: Needs assistance Equipment used: Rolling walker (2 wheeled) Transfers: Sit to/from Stand Sit to Stand: Mod assist         General transfer comment: Pt able to stand from edge of bed with modA to boost, took side steps at edge of bed with increased time, cues  for stepping initiation. Demonstrates posterior lean  Ambulation/Gait                Stairs            Wheelchair Mobility    Modified Rankin (Stroke Patients Only)       Balance Overall balance assessment: Needs assistance Sitting-balance support: Feet unsupported;Bilateral upper extremity supported Sitting balance-Leahy Scale: Poor Sitting balance - Comments: Posterior lean requiring frequent modA to correct Postural control: Posterior lean   Standing balance-Leahy Scale: Poor Standing balance comment: reliant on external support, retropulsion                             Pertinent Vitals/Pain Pain Assessment: No/denies pain    Home Living Family/patient expects to be discharged to:: Private residence Living Arrangements: Alone Available Help at Discharge: Family;Available PRN/intermittently(sister) Type of Home: House Home Access: Ramped entrance     Home Layout: One level Home Equipment: Cane - single point;Walker - 2 wheels      Prior Function Level of Independence: Needs assistance   Gait / Transfers Assistance Needed: uses cane for mobility  ADL's / Homemaking Assistance Needed: pt sister assists with IADL's        Hand Dominance        Extremity/Trunk Assessment   Upper Extremity Assessment Upper Extremity Assessment: Generalized weakness    Lower Extremity Assessment Lower Extremity Assessment: Generalized weakness       Communication   Communication: Other (comment)(soft spoken)  Cognition Arousal/Alertness: Lethargic Behavior During Therapy: WFL for tasks assessed/performed Overall Cognitive Status: Impaired/Different from baseline Area of Impairment:  Orientation;Attention;Following commands;Awareness;Problem solving                 Orientation Level: Disoriented to;Time;Situation Current Attention Level: Focused   Following Commands: Follows one step commands with increased time;Follows one step  commands inconsistently   Awareness: Intellectual Problem Solving: Slow processing;Decreased initiation;Requires verbal cues General Comments: Pt very drowsy, follows 1 step commands with repetition and increased time. Oriented to place, year when provided options, when asked why she was here, pt stated, "probably because I fell." Slow speech, increased time to respond and for processing. Cues provided for task initiation i.e. drinking, eating       General Comments      Exercises     Assessment/Plan    PT Assessment Patient needs continued PT services  PT Problem List Decreased strength;Decreased activity tolerance;Decreased balance;Decreased mobility;Decreased cognition       PT Treatment Interventions DME instruction;Gait training;Functional mobility training;Therapeutic activities;Therapeutic exercise;Balance training;Patient/family education    PT Goals (Current goals can be found in the Care Plan section)  Acute Rehab PT Goals Patient Stated Goal: "not feel lazy." PT Goal Formulation: With patient Time For Goal Achievement: 04/24/19 Potential to Achieve Goals: Fair    Frequency Min 3X/week   Barriers to discharge        Co-evaluation               AM-PAC PT "6 Clicks" Mobility  Outcome Measure Help needed turning from your back to your side while in a flat bed without using bedrails?: A Lot Help needed moving from lying on your back to sitting on the side of a flat bed without using bedrails?: Total Help needed moving to and from a bed to a chair (including a wheelchair)?: A Lot Help needed standing up from a chair using your arms (e.g., wheelchair or bedside chair)?: A Lot Help needed to walk in hospital room?: A Lot Help needed climbing 3-5 steps with a railing? : Total 6 Click Score: 10    End of Session Equipment Utilized During Treatment: Gait belt Activity Tolerance: Patient tolerated treatment well Patient left: in bed;with bed alarm set Nurse  Communication: Mobility status;Other (comment)(O2) PT Visit Diagnosis: Unsteadiness on feet (R26.81);Muscle weakness (generalized) (M62.81);History of falling (Z91.81);Difficulty in walking, not elsewhere classified (R26.2)    Time: 9390-3009 PT Time Calculation (min) (ACUTE ONLY): 21 min   Charges:   PT Evaluation $PT Eval Moderate Complexity: 1 Mod            Lillia Pauls, PT, DPT Acute Rehabilitation Services Pager [phone] Office [phone]   Norval Morton 04/10/2019, 10:58 AM

## 2019-04-10 NOTE — Progress Notes (Addendum)
Progress Note  Patient Name: CHRISOULA ZEGARRA Date of Encounter: 04/10/2019  Primary Cardiologist:  Olga Millers, MD  Subjective   Oriented to name, not really awake yet, denies CP/SOB  Inpatient Medications    Scheduled Meds:  aspirin EC  81 mg Oral Daily   atorvastatin  40 mg Oral q1800   citalopram  10 mg Oral Daily   donepezil  5 mg Oral QHS   furosemide  40 mg Intravenous BID   metoprolol tartrate  25 mg Oral Q6H   potassium chloride  40 mEq Oral BID   sodium chloride flush  3 mL Intravenous Q12H   Continuous Infusions:  sodium chloride     heparin 650 Units/hr (04/10/19 0732)   nitroGLYCERIN 20 mcg/min (04/10/19 0400)   PRN Meds: sodium chloride, acetaminophen, ALPRAZolam, nitroGLYCERIN, ondansetron (ZOFRAN) IV, sodium chloride flush, zolpidem   Vital Signs    Vitals:   04/10/19 0648 04/10/19 0800 04/10/19 0816 04/10/19 0817  BP: 118/77  100/70   Pulse: (!) 103 94 (!) 44 84  Resp:      Temp:      TempSrc:      SpO2:  99% 97% 100%  Weight:      Height:        Intake/Output Summary (Last 24 hours) at 04/10/2019 0830 Last data filed at 04/10/2019 0400 Gross per 24 hour  Intake 425.71 ml  Output 1050 ml  Net -624.29 ml   Filed Weights   04/08/19 2241 04/09/19 0557 04/10/19 0350  Weight: 53.9 kg 52 kg 51.5 kg   Last Weight  Most recent update: 04/10/2019  3:50 AM    Weight  51.5 kg (113 lb 8.6 oz)            Weight change: -2.4 kg   Telemetry    Atrial fib, generally 90-110 - Personally Reviewed  ECG    02/1 ECG is atrial fib, HR 85, +ST elevation V 3-4, inverted lateral T waves, evolving MI changes - Personally Reviewed  Physical Exam   General: Well developed, frail, elderly, female in no acute distress Head: Eyes PERRLA, Head normocephalic and atraumatic Lungs: clear bilaterally to auscultation. Heart: Irreg R&R S1 S2, without rub or gallop. No murmur. Upper extremity pulses are 2+ & equal. No JVD. R popliteal 1-2+, not able to  palpate R DP/PT, cap refill delayed. L PT 1-2+, cap refill WNL Abdomen: Bowel sounds are present, abdomen soft and non-tender without masses or  hernias noted. Msk: Weak strength and tone for age. Extremities: No clubbing, cyanosis or edema.    Skin:  No rashes or lesions noted. Neuro: Alert and oriented X 1 Psych:  Good affect, responds appropriately  Labs    Hematology Recent Labs  Lab 04/08/19 1424 04/08/19 1424 04/08/19 1446 04/09/19 0424 04/10/19 0359  WBC 6.1  --   --  7.6 10.7*  RBC 4.26  --   --  4.28 4.06  HGB 13.7   < > 14.3 13.9 13.1  HCT 41.2   < > 42.0 41.5 40.1  MCV 96.7  --   --  97.0 98.8  MCH 32.2  --   --  32.5 32.3  MCHC 33.3  --   --  33.5 32.7  RDW 13.3  --   --  13.5 13.8  PLT 167  --   --  173 156   < > = values in this interval not displayed.    Chemistry Recent Labs  Lab 04/08/19 1446  04/09/19 0424 04/10/19 0359  NA 139 140 142  K 3.5 3.2* 4.8  CL 104 102 102  CO2  --  25 27  GLUCOSE 147* 139* 130*  BUN 17 16 21   CREATININE 0.60 0.91 0.89  CALCIUM  --  9.2 9.5  GFRNONAA  --  55* 57*  GFRAA  --  >60 >60  ANIONGAP  --  13 13     High Sensitivity Troponin:   Recent Labs  Lab 04/08/19 1424 04/08/19 1653  TROPONINIHS 23,357* >27,000.0*      BNP Recent Labs  Lab 04/08/19 1653  BNP 476.7*     DDimer  Recent Labs  Lab 04/08/19 1653  DDIMER 0.63*    Lab Results  Component Value Date   CHOL 150 04/09/2019   HDL 79 04/09/2019   LDLCALC 63 04/09/2019   TRIG 40 04/09/2019   CHOLHDL 1.9 04/09/2019    Radiology    DG Chest Port 1 View  Result Date: 04/08/2019 CLINICAL DATA:  Chest pain. EXAM: PORTABLE CHEST 1 VIEW COMPARISON:  Jul 02, 2018. FINDINGS: Stable cardiomediastinal silhouette. No pneumothorax or pleural effusion is noted. Stable interstitial densities are noted most consistent with scarring. Acute superimposed edema or inflammation cannot be excluded. Bony thorax is unremarkable. IMPRESSION: Stable interstitial  densities are noted most consistent with scarring, but acute superimposed edema or inflammation cannot be excluded. Electronically Signed   By: Marijo Conception M.D.   On: 04/08/2019 14:59   ECHOCARDIOGRAM COMPLETE  Result Date: 04/08/2019    ECHOCARDIOGRAM REPORT   Patient Name:   AYRABELLA LABOMBARD Date of Exam: 04/08/2019 Medical Rec #:  [id number]        Height:       62.0 in Accession #:    [id number]       Weight:       119.0 lb Date of Birth:  12/01/1926         BSA:          1.53 m Patient Age:    86 years         BP:           144/114 mmHg Patient Gender: F                HR:           103 bpm. Exam Location:  Inpatient Procedure: 2D Echo Indications:    NSTEMI I21.4  History:        Patient has prior history of Echocardiogram examinations, most                 recent 12/31/2017. CHF, Mitral Valve Disease, Arrythmias:Atrial                 Fibrillation; Risk Factors:Dyslipidemia and Hypertension.  Sonographer:    Clayton Lefort RDCS (AE) Referring Phys: [id number] [place]  1. Left ventricular ejection fraction, by estimation, is 30 to 35%. The left ventricle has moderate to severely decreased function. The left ventricle demonstrates regional wall motion abnormalities (see scoring diagram/findings for description). There is mildly increased left ventricular hypertrophy. Left ventricular diastolic parameters are indeterminate.  2. Right ventricular systolic function is moderately reduced. The right ventricular size is normal. There is severely elevated pulmonary artery systolic pressure. The estimated right ventricular systolic pressure is 12.4 mmHg.  3. Left atrial size was severely dilated.  4. Right atrial size was severely dilated.  5. The mitral valve is degenerative. Moderate to severe mitral  valve regurgitation.  6. The tricuspid valve is abnormal. Tricuspid valve regurgitation is moderate to severe.  7. The aortic valve is tricuspid. Aortic valve regurgitation is not visualized. Mild  to moderate aortic valve sclerosis/calcification is present, without any evidence of aortic stenosis.  8. The inferior vena cava is dilated in size with <50% respiratory variability, suggesting right atrial pressure of 15 mmHg. FINDINGS  Left Ventricle: Left ventricular ejection fraction, by estimation, is 30 to 35%. The left ventricle has moderate to severely decreased function. The left ventricle demonstrates regional wall motion abnormalities. There is mildly increased left ventricular hypertrophy. Left ventricular diastolic parameters are indeterminate.  LV Wall Scoring: The mid and distal anterior wall, mid and distal anterior septum, mid and distal inferior wall, mid inferoseptal segment, and apex are akinetic. The basal anteroseptal segment and basal anterior segment are hypokinetic. The entire lateral wall, basal inferior segment, and basal inferoseptal segment are normal. Right Ventricle: The right ventricular size is normal. No increase in right ventricular wall thickness. Right ventricular systolic function is moderately reduced. There is severely elevated pulmonary artery systolic pressure. The tricuspid regurgitant velocity is 3.83 m/s, and with an assumed right atrial pressure of 15 mmHg, the estimated right ventricular systolic pressure is 73.7 mmHg. Left Atrium: Left atrial size was severely dilated. Right Atrium: Right atrial size was severely dilated. Pericardium: There is no evidence of pericardial effusion. Mitral Valve: The mitral valve is degenerative in appearance. Moderate to severe mitral valve regurgitation. Tricuspid Valve: The tricuspid valve is abnormal. Tricuspid valve regurgitation is moderate to severe. Aortic Valve: The aortic valve is tricuspid. Aortic valve regurgitation is not visualized. Mild to moderate aortic valve sclerosis/calcification is present, without any evidence of aortic stenosis. Pulmonic Valve: The pulmonic valve was not well visualized. Pulmonic valve regurgitation  is not visualized. Aorta: The aortic root is normal in size and structure. Venous: The inferior vena cava is dilated in size with less than 50% respiratory variability, suggesting right atrial pressure of 15 mmHg. IAS/Shunts: The interatrial septum was not well visualized.  LEFT VENTRICLE PLAX 2D LVIDd:         3.64 cm LVIDs:         2.53 cm LV PW:         1.22 cm LV IVS:        1.21 cm LVOT diam:     1.80 cm LV SV:         23.51 ml LV SV Index:   21.36 LVOT Area:     2.54 cm  RIGHT VENTRICLE            IVC RV Basal diam:  2.79 cm    IVC diam: 1.95 cm RV S prime:     7.83 cm/s TAPSE (M-mode): 1.2 cm LEFT ATRIUM             Index       RIGHT ATRIUM           Index LA diam:        4.10 cm 2.67 cm/m  RA Area:     22.10 cm LA Vol (A2C):   87.2 ml 56.88 ml/m RA Volume:   69.20 ml  45.14 ml/m LA Vol (A4C):   75.9 ml 49.51 ml/m LA Biplane Vol: 85.2 ml 55.57 ml/m  AORTIC VALVE LVOT Vmax:   60.96 cm/s LVOT Vmean:  40.500 cm/s LVOT VTI:    0.092 m  AORTA Ao Root diam: 2.60 cm Ao Asc diam:  3.00 cm  MR Peak grad:    96.8 mmHg   TRICUSPID VALVE MR Mean grad:    66.0 mmHg   TR Peak grad:   58.7 mmHg MR Vmax:         492.00 cm/s TR Vmax:        383.00 cm/s MR Vmean:        390.0 cm/s MR PISA:         5.09 cm    SHUNTS MR PISA Eff ROA: 33 mm      Systemic VTI:  0.09 m MR PISA Radius:  0.90 cm     Systemic Diam: 1.80 cm Epifanio Lesches MD Electronically signed by Epifanio Lesches MD Signature Date/Time: 04/08/2019/8:57:24 PM    Final      Cardiac Studies   ECHO:  02/12  1. Left ventricular ejection fraction, by estimation, is 30 to 35%. The  left ventricle has moderate to severely decreased function. The left  ventricle demonstrates regional wall motion abnormalities (see scoring  diagram/findings for description). There  is mildly increased left ventricular hypertrophy. Left ventricular  diastolic parameters are indeterminate.   2. Right ventricular systolic function is moderately reduced. The right    ventricular size is normal. There is severely elevated pulmonary artery  systolic pressure. The estimated right ventricular systolic pressure is  73.7 mmHg.   3. Left atrial size was severely dilated.   4. Right atrial size was severely dilated.   5. The mitral valve is degenerative. Moderate to severe mitral valve  regurgitation.   6. The tricuspid valve is abnormal. Tricuspid valve regurgitation is  moderate to severe.   7. The aortic valve is tricuspid. Aortic valve regurgitation is not  visualized. Mild to moderate aortic valve sclerosis/calcification is  present, without any evidence of aortic stenosis.   8. The inferior vena cava is dilated in size with <50% respiratory  variability, suggesting right atrial pressure of 15 mmHg.   Patient Profile     86 y.o. female w/ hx persistent atrial fibrillation on Xarelto, moderate MR, moderate to severe TR, chronic diastolic CHF (preserved EF 2019), hypertension, hyperlipidemia, and  anxiety who was admitted 02/12 for chest pain   Assessment & Plan    1. NSTEMI - ECG 04/09/19 may meet STEMI criteria, but decision has been made for med rx - change to oral nitrates today  - on Lipitor 40 mg, BB, ASA - monitor for pain - because of Xarelto for Afib, not on DAPT  2. Acute systolic CHF - EF 30-35%, RA pressure 15 mmHg, BNP elevated - change to PO diuretics today  - d/c K+ supp   3. Perm Afib - now on metoprolol 25 mg q 6 hr consolidate to 50 bid  - HR generally controlled - Xarelto held, currently on heparin   Active Problems:   NSTEMI (non-ST elevated myocardial infarction) (HCC)   Charlton Haws MD [facility]

## 2019-04-10 NOTE — Progress Notes (Signed)
Patient's ECG resulted as abnormal at 0342 on 04/10/19 showing Acute MI / STEMI. Cardiology was paged, patient was assessed by on-call MD, no change in plan of care at this time. Will continue to monitor.   Bari Edward, RN

## 2019-04-11 DIAGNOSIS — Z7901 Long term (current) use of anticoagulants: Secondary | ICD-10-CM

## 2019-04-11 DIAGNOSIS — I4821 Permanent atrial fibrillation: Secondary | ICD-10-CM

## 2019-04-11 LAB — BASIC METABOLIC PANEL
Anion gap: 13 (ref 5–15)
BUN: 28 mg/dL — ABNORMAL HIGH (ref 8–23)
CO2: 24 mmol/L (ref 22–32)
Calcium: 9.1 mg/dL (ref 8.9–10.3)
Chloride: 101 mmol/L (ref 98–111)
Creatinine, Ser: 0.89 mg/dL (ref 0.44–1.00)
GFR calc Af Amer: >60 mL/min (ref >60)
GFR calc non Af Amer: 57 mL/min — ABNORMAL LOW (ref >60)
Glucose, Bld: 121 mg/dL — ABNORMAL HIGH (ref 70–99)
Potassium: 4 mmol/L (ref 3.5–5.1)
Sodium: 138 mmol/L (ref 135–145)

## 2019-04-11 LAB — CBC
HCT: 38.8 % (ref 36.0–46.0)
Hemoglobin: 12.9 g/dL (ref 12.0–15.0)
MCH: 32.6 pg (ref 26.0–34.0)
MCHC: 33.2 g/dL (ref 30.0–36.0)
MCV: 98 fL (ref 80.0–100.0)
Platelets: 153 10*3/uL (ref 150–400)
RBC: 3.96 MIL/uL (ref 3.87–5.11)
RDW: 13.7 % (ref 11.5–15.5)
WBC: 8.5 10*3/uL (ref 4.0–10.5)
nRBC: 0 % (ref 0.0–0.2)

## 2019-04-11 LAB — APTT: aPTT: 66 seconds — ABNORMAL HIGH (ref 24–36)

## 2019-04-11 LAB — HEPARIN LEVEL (UNFRACTIONATED): Heparin Unfractionated: 0.35 IU/mL (ref 0.30–0.70)

## 2019-04-11 MED ORDER — ATORVASTATIN CALCIUM 40 MG PO TABS: 40.0000 mg | ORAL_TABLET | Freq: Every day | ORAL | Status: DC

## 2019-04-11 MED ORDER — RIVAROXABAN 15 MG PO TABS
15.0000 mg | ORAL_TABLET | Freq: Every day | ORAL | Status: DC
  Administered 2019-04-11 – 2019-04-12 (×2): 15 mg via ORAL
  Filled 2019-04-11 (×2): qty 1

## 2019-04-11 NOTE — Progress Notes (Signed)
Progress Note  Patient Name: Ann Booth Date of Encounter: 04/11/2019  Primary Cardiologist: Olga Millers, MD   Subjective   No chest pain, no shortness of breath  Inpatient Medications    Scheduled Meds: . aspirin EC  81 mg Oral Daily  . atorvastatin  40 mg Oral q1800  . citalopram  10 mg Oral Daily  . donepezil  5 mg Oral QHS  . furosemide  20 mg Oral Daily  . isosorbide dinitrate  10 mg Oral BID  . metoprolol tartrate  50 mg Oral BID  . sodium chloride flush  3 mL Intravenous Q12H   Continuous Infusions: . sodium chloride    . heparin Stopped (04/11/19 0839)   PRN Meds: sodium chloride, acetaminophen, ALPRAZolam, nitroGLYCERIN, ondansetron (ZOFRAN) IV, sodium chloride flush, zolpidem   Vital Signs    Vitals:   04/11/19 0019 04/11/19 0317 04/11/19 0400 04/11/19 0842  BP:  (!) 112/92  122/85  Pulse: 73 (!) 102 88 (!) 102  Resp:  20    Temp:  98.2 F (36.8 C)    TempSrc:  Oral    SpO2: 95% 98% (!) 86%   Weight:  51.7 kg    Height:        Intake/Output Summary (Last 24 hours) at 04/11/2019 0913 Last data filed at 04/11/2019 0400 Gross per 24 hour  Intake 328.57 ml  Output 200 ml  Net 128.57 ml   Last 3 Weights 04/11/2019 04/10/2019 04/09/2019  Weight (lbs) 113 lb 15.7 oz 113 lb 8.6 oz 114 lb 10.2 oz  Weight (kg) 51.7 kg 51.5 kg 52 kg      Telemetry    Atrial fibrillation with variable ventricular response- Personally Reviewed  ECG    Atrial fibrillation, anterior ST changes consistent with evolving anterior MI- Personally Reviewed  Physical Exam   GEN: No acute distress.  Frail, sleepy Neck: No JVD Cardiac:  Irregular rhythm,.  Respiratory:  No shortness of breath while lying flat GI: Soft, nontender, non-distended  MS: No edema; No deformity. Neuro:  Nonfocal  Psych: Flat affect   Labs    High Sensitivity Troponin:   Recent Labs  Lab 04/08/19 1424 04/08/19 1653  TROPONINIHS 23,357* >27,000.0*      Chemistry Recent Labs  Lab  04/09/19 0424 04/10/19 0359 04/11/19 0337  NA 140 142 138  K 3.2* 4.8 4.0  CL 102 102 101  CO2 25 27 24   GLUCOSE 139* 130* 121*  BUN 16 21 28*  CREATININE 0.91 0.89 0.89  CALCIUM 9.2 9.5 9.1  GFRNONAA 55* 57* 57*  GFRAA >60 >60 >60  ANIONGAP 13 13 13      Hematology Recent Labs  Lab 04/09/19 0424 04/10/19 0359 04/11/19 0337  WBC 7.6 10.7* 8.5  RBC 4.28 4.06 3.96  HGB 13.9 13.1 12.9  HCT 41.5 40.1 38.8  MCV 97.0 98.8 98.0  MCH 32.5 32.3 32.6  MCHC 33.5 32.7 33.2  RDW 13.5 13.8 13.7  PLT 173 156 153    BNP Recent Labs  Lab 04/08/19 1653  BNP 476.7*     DDimer  Recent Labs  Lab 04/08/19 1653  DDIMER 0.63*     Radiology    No results found.  Cardiac Studies   EF 30 to 35%  Patient Profile     86 y.o. female with anterior MI, late presenting  Assessment & Plan    #1 acute anterior MI: Continue with aggressive secondary prevention.  No angina at this time.  No signs  of heart failure.  I had a long discussion with the patient's nephew, Eual Fines.  He states that the patient had some intermittent confusion at home and has had some falls.  There are cameras in the house so that she can be monitored, but she was living by herself.  I explained that she is going to need to get stronger.  We will consult with palliative care to help with goals of care.  At this point, there are no plans for any invasive testing.  We want to treat any shortness of breath or angina if it were to occur.  He was agreeable to this plan.  She may need transfer to a rehab facility or less likely, home health PT.  #2 atrial fibrillation: Borderline rate control.  She had been on Xarelto in the past.  She removed her IV today so heparin was held.  Given that there are no plans for invasive testing, could restart Xarelto.  Would have to hold off on aspirin given her anticoagulation.  #3 given her MI, statin would be indicated.  We will see how well she is taking her pills.  Start  atorvastatin 40 mg daily.  She is somewhat sleepy today.      For questions or updates, please contact [place] Please consult [email or web address] for contact info under        Signed, Larae Grooms, MD  04/11/2019, 9:13 AM

## 2019-04-11 NOTE — Progress Notes (Signed)
ANTICOAGULATION CONSULT NOTE  Pharmacy Consult for heparin Indication: chest pain/ACS  Allergies  Allergen Reactions  . Cymbalta [Duloxetine Hcl] Other (See Comments)    Patient Measurements: Height: 5\' 2"  (157.5 cm) Weight: 113 lb 15.7 oz (51.7 kg) IBW/kg (Calculated) : 50.1 Heparin Dosing Weight: 54 kg  Vital Signs: Temp: 98.2 F (36.8 C) (02/15 0317) Temp Source: Oral (02/15 0317) BP: 122/85 (02/15 0842) Pulse Rate: 102 (02/15 0842)  Labs: Recent Labs    04/08/19 1424 04/08/19 1424 04/08/19 1446 04/08/19 1540 04/08/19 1653 04/09/19 0215 04/09/19 0424 04/09/19 0424 04/10/19 0359 04/11/19 0337  HGB 13.7   < >   < >  --   --   --  13.9   < > 13.1 12.9  HCT 41.2   < >   < >  --   --   --  41.5  --  40.1 38.8  PLT 167   < >  --   --   --   --  173  --  156 153  APTT  --   --    < >   < >  --  66*  --   --  78* 66*  HEPARINUNFRC  --    < >   < >  --   --   --  1.58*  --  0.65 0.35  CREATININE  --    < >   < >  --   --   --  0.91  --  0.89 0.89  TROPONINIHS 23,357*  --   --   --  >27,000.0*  --   --   --   --   --    < > = values in this interval not displayed.    Estimated Creatinine Clearance: 32.6 mL/min (by C-G formula based on SCr of 0.89 mg/dL).  Assessment: 86 y.o. female with h/o Afib, Xarelto on hold on heparin for NSTEMI. Plans noted for medical management -heparin level at goal, CBC stable   Spoke to Dr. 82 and will restart Xarelto tonight.   Goal of Therapy:  Heparin level 0.3-0.7 units/ml aPTT 66-102 seconds Monitor platelets by anticoagulation protocol: Yes   Plan:  -Stopp heparin at 5pm and restart Xarelto 15mg  po daily  Eldridge Dace, PharmD Clinical Pharmacist **Pharmacist phone directory can now be found on [email or web address] ([email or web address]).  Listed under [facility].

## 2019-04-11 NOTE — NC FL2 (Addendum)
[place] LEVEL OF CARE SCREENING TOOL     IDENTIFICATION  Patient Name: Ann Booth Birthdate: 09/14/1926 Sex: female Admission Date (Current Location): 04/08/2019  [facility] and [place] Number:  Herbalist and Address:  [facility]. [facility], [place] [address], [place], [place] [address]      Provider Number: [id number]  Attending Physician Name and Address:  Donato Heinz,*  Relative Name and Phone Number:  Eual Fines [phone]    Current Level of Care: Hospital Recommended Level of Care: [place] Prior Approval Number:    Date Approved/Denied:   PASRR Number:   [id number]  Discharge Plan: SNF    Current Diagnoses: Patient Active Problem List   Diagnosis Date Noted  . NSTEMI (non-ST elevated myocardial infarction) ([place]) 04/08/2019  . Memory changes 03/22/2019  . Delusional thoughts ([place]) 03/22/2019  . Atrial fibrillation with RVR ([place]) 07/02/2018  . Recurrent falls while walking 06/17/2018  . Urinary incontinence 06/17/2018  . Thoracic spine pain 04/05/2018  . Hyperglycemia 04/05/2018  . Falls frequently 03/27/2018  . Other fatigue 03/10/2018  . Degenerative arthritis of left knee 06/25/2017  . Peripheral vascular disease ([place]) 05/21/2017  . Rib pain 04/24/2017  . Chest pain at rest 03/16/2017  . Callus of foot 05/26/2016  . Right foot pain 03/21/2016  . Allergic conjunctivitis 03/21/2016  . Insomnia 07/28/2013  . Arthritis of sacroiliac joint 03/30/2013  . Leg length discrepancy 03/30/2013  . N&V (nausea and vomiting) 01/27/2013  . Atrial fibrillation ([place]) 11/01/2012  . Conjunctivitis 07/21/2012  . Carotid stenosis 04/27/2012  . Chronic lower back pain 04/27/2012  . Idiopathic scoliosis 04/27/2012  . Black stools 10/25/2011  . HTN (hypertension) 08/23/2010  . Preventative health care 07/25/2010  . Peripheral edema 07/25/2010  . Hypercalcemia 11/09/2008  . POSITIONAL VERTIGO 11/09/2008   . BACK PAIN 11/09/2008  . Peripheral neuropathic pain 06/01/2007  . Allergic rhinitis 06/01/2007  . SHOULDER PAIN, BILATERAL 06/01/2007  . FATIGUE 06/01/2007  . Hyperlipidemia 10/18/2006  . Anxiety state 10/18/2006  . Depression 10/18/2006  . Unspecified glaucoma 10/15/2006  . CATARACT NOS 10/15/2006  . Mitral valve disorder 10/15/2006  . Asthma 10/15/2006  . Rosacea 10/15/2006  . Osteoporosis 10/15/2006    Orientation RESPIRATION BLADDER Height & Weight     Self  Normal Incontinent, External catheter Weight: 113 lb 15.7 oz (51.7 kg) Height:  5\' 2"  (157.5 cm)  BEHAVIORAL SYMPTOMS/MOOD NEUROLOGICAL BOWEL NUTRITION STATUS      Continent Diet(see d/c summary)  AMBULATORY STATUS COMMUNICATION OF NEEDS Skin   Extensive Assist Verbally Normal                       Personal Care Assistance Level of Assistance  Bathing, Dressing, Feeding Bathing Assistance: Maximum assistance Feeding assistance: Maximum assistance Dressing Assistance: Maximum assistance     Functional Limitations Info             SPECIAL CARE FACTORS FREQUENCY  PT (By licensed PT), OT (By licensed OT)     PT Frequency: 5x a week OT Frequency: 5x a week            Contractures Contractures Info: Not present    Additional Factors Info  Code Status, Allergies Code Status Info: DNR Allergies Info: Cymbalta (duloxetine Hcl)           Current Medications (04/11/2019):  This is the current hospital active medication list Current Facility-Administered Medications  Medication Dose Route Frequency  Provider Last Rate Last Admin  . 0.9 %  sodium chloride infusion  250 mL Intravenous PRN Furth, Cadence H, PA-C      . acetaminophen (TYLENOL) tablet 650 mg  650 mg Oral Q4H PRN Furth, Cadence H, PA-C      . ALPRAZolam Prudy Feeler) tablet 0.25 mg  0.25 mg Oral BID PRN Furth, Cadence H, PA-C      . aspirin EC tablet 81 mg  81 mg Oral Daily Furth, Cadence H, PA-C   81 mg at 04/11/19 0842  . atorvastatin  (LIPITOR) tablet 40 mg  40 mg Oral q1800 Barrett, Rhonda G, PA-C   40 mg at 04/10/19 1756  . citalopram (CELEXA) tablet 10 mg  10 mg Oral Daily Furth, Cadence H, PA-C   10 mg at 04/11/19 0842  . donepezil (ARICEPT) tablet 5 mg  5 mg Oral QHS Furth, Cadence H, PA-C   5 mg at 04/10/19 2217  . furosemide (LASIX) tablet 20 mg  20 mg Oral Daily Wendall Stade, MD   20 mg at 04/11/19 0842  . heparin ADULT infusion 100 units/mL (25000 units/221mL sodium chloride 0.45%)  650 Units/hr Intravenous Continuous Corky Crafts, MD 6.5 mL/hr at 04/11/19 1049 650 Units/hr at 04/11/19 1049  . isosorbide dinitrate (ISORDIL) tablet 10 mg  10 mg Oral BID Wendall Stade, MD   10 mg at 04/11/19 0842  . metoprolol tartrate (LOPRESSOR) tablet 50 mg  50 mg Oral BID Wendall Stade, MD   50 mg at 04/11/19 0842  . nitroGLYCERIN (NITROSTAT) SL tablet 0.4 mg  0.4 mg Sublingual Q5 Min x 3 PRN Furth, Cadence H, PA-C      . ondansetron (ZOFRAN) injection 4 mg  4 mg Intravenous Q6H PRN Furth, Cadence H, PA-C      . Rivaroxaban (XARELTO) tablet 15 mg  15 mg Oral Q supper Lance Muss S, MD      . sodium chloride flush (NS) 0.9 % injection 3 mL  3 mL Intravenous Q12H Furth, Cadence H, PA-C   3 mL at 04/10/19 2200  . sodium chloride flush (NS) 0.9 % injection 3 mL  3 mL Intravenous PRN Furth, Cadence H, PA-C      . zolpidem (AMBIEN) tablet 5 mg  5 mg Oral QHS PRN Fransico Michael, Cadence H, PA-C         Discharge Medications: Please see discharge summary for a list of discharge medications.  Relevant Imaging Results:  Relevant Lab Results:   Additional Information SSN [id number]  Dannette Barbara Riddick, LCSW

## 2019-04-11 NOTE — Plan of Care (Signed)
  Problem: Coping: Goal: Level of anxiety will decrease Outcome: Progressing   Problem: Pain Managment: Goal: General experience of comfort will improve Outcome: Progressing   Problem: Safety: Goal: Ability to remain free from injury will improve Outcome: Progressing   Problem: Nutrition: Goal: Adequate nutrition will be maintained Outcome: Not Progressing Pt has little to no appetite. Pt offered breakfast this morning however would only eat a couple bites of applesauce and a few sips of water. Pt encouraged to eat but refuses at this time.    Problem: Education: Goal: Knowledge of General Education information will improve Description: Including pain rating scale, medication(s)/side effects and non-pharmacologic comfort measures Outcome: Not Met (add Reason)  Pt continues to be confused. Pt able to tell me her name and DOB this morning.

## 2019-04-11 NOTE — TOC Progression Note (Signed)
Transition of Care [facility]) - Progression Note    Patient Details  Name: Ann Booth MRN: [id number] Date of Birth: 27-Apr-1926  Transition of Care [facility]) CM/SW Contact  Nonda Lou, [place] Phone Number: 04/11/2019, 2:05 PM  Clinical Narrative:    CSW contacted Janie at [facility] [phone] to check on bed availability for tomorrow. CSW informed a bed can not be offered today and to follow-up tomorrow morning.    Expected Discharge Plan: Skilled Nursing Facility Barriers to Discharge: No Barriers Identified  Expected Discharge Plan and Services Expected Discharge Plan: Skilled Nursing Facility       Living arrangements for the past 2 months: Single Family Home                                       Social Determinants of Health (SDOH) Interventions    Readmission Risk Interventions No flowsheet data found.

## 2019-04-11 NOTE — Progress Notes (Addendum)
Progress Note  Patient Name: Ann Booth Date of Encounter: 04/11/2019  Primary Cardiologist: Kirk Ruths, MD   Subjective   Pt awake, NT bathing. She denies chest pain. Supine and breathing well.  Inpatient Medications    Scheduled Meds: . aspirin EC  81 mg Oral Daily  . atorvastatin  40 mg Oral q1800  . citalopram  10 mg Oral Daily  . donepezil  5 mg Oral QHS  . furosemide  20 mg Oral Daily  . isosorbide dinitrate  10 mg Oral BID  . metoprolol tartrate  50 mg Oral BID  . sodium chloride flush  3 mL Intravenous Q12H   Continuous Infusions: . sodium chloride    . heparin 650 Units/hr (04/10/19 1500)   PRN Meds: sodium chloride, acetaminophen, ALPRAZolam, nitroGLYCERIN, ondansetron (ZOFRAN) IV, sodium chloride flush, zolpidem   Vital Signs    Vitals:   04/10/19 2016 04/11/19 0019 04/11/19 0317 04/11/19 0400  BP:   (!) 112/92   Pulse: 82 73 (!) 102 88  Resp:   20   Temp:   98.2 F (36.8 C)   TempSrc:   Oral   SpO2: 98% 95% 98% (!) 86%  Weight:   51.7 kg   Height:        Intake/Output Summary (Last 24 hours) at 04/11/2019 0815 Last data filed at 04/11/2019 0400 Gross per 24 hour  Intake 446.57 ml  Output 200 ml  Net 246.57 ml   Last 3 Weights 04/11/2019 04/10/2019 04/09/2019  Weight (lbs) 113 lb 15.7 oz 113 lb 8.6 oz 114 lb 10.2 oz  Weight (kg) 51.7 kg 51.5 kg 52 kg      Telemetry    Afib with ventricular rate in the 90-100s - Personally Reviewed  ECG    No new tracings - Personally Reviewed  Physical Exam   GEN: elderly female, No acute distress.   Neck: No JVD Cardiac: iRRR, no murmurs, rubs, or gallops.  Respiratory: Clear to auscultation bilaterally. GI: Soft, nontender, non-distended  MS: No edema; No deformity. Neuro:  Nonfocal  Psych: Normal affect   Labs    High Sensitivity Troponin:   Recent Labs  Lab 04/08/19 1424 04/08/19 1653  TROPONINIHS 23,357* >27,000.0*      Chemistry Recent Labs  Lab 04/09/19 0424  04/10/19 0359 04/11/19 0337  NA 140 142 138  K 3.2* 4.8 4.0  CL 102 102 101  CO2 25 27 24   GLUCOSE 139* 130* 121*  BUN 16 21 28*  CREATININE 0.91 0.89 0.89  CALCIUM 9.2 9.5 9.1  GFRNONAA 55* 57* 57*  GFRAA >60 >60 >60  ANIONGAP 13 13 13      Hematology Recent Labs  Lab 04/09/19 0424 04/10/19 0359 04/11/19 0337  WBC 7.6 10.7* 8.5  RBC 4.28 4.06 3.96  HGB 13.9 13.1 12.9  HCT 41.5 40.1 38.8  MCV 97.0 98.8 98.0  MCH 32.5 32.3 32.6  MCHC 33.5 32.7 33.2  RDW 13.5 13.8 13.7  PLT 173 156 153    BNP Recent Labs  Lab 04/08/19 1653  BNP 476.7*     DDimer  Recent Labs  Lab 04/08/19 1653  DDIMER 0.63*     Radiology    No results found.  Cardiac Studies   Echo 04/08/19: 1. Left ventricular ejection fraction, by estimation, is 30 to 35%. The  left ventricle has moderate to severely decreased function. The left  ventricle demonstrates regional wall motion abnormalities (see scoring  diagram/findings for description). There  is mildly increased  left ventricular hypertrophy. Left ventricular  diastolic parameters are indeterminate.  2. Right ventricular systolic function is moderately reduced. The right  ventricular size is normal. There is severely elevated pulmonary artery  systolic pressure. The estimated right ventricular systolic pressure is  73.7 mmHg.  3. Left atrial size was severely dilated.  4. Right atrial size was severely dilated.  5. The mitral valve is degenerative. Moderate to severe mitral valve  regurgitation.  6. The tricuspid valve is abnormal. Tricuspid valve regurgitation is  moderate to severe.  7. The aortic valve is tricuspid. Aortic valve regurgitation is not  visualized. Mild to moderate aortic valve sclerosis/calcification is  present, without any evidence of aortic stenosis.  8. The inferior vena cava is dilated in size with <50% respiratory  variability, suggesting right atrial pressure of 15 mmHg.   Patient Profile     86  y.o. female  with a hx of persistent atrial fibrillation on Xarelto, moderate MR, moderate to severe TR, chronic diastolic CHF (preserved EF 2019), hypertension, hyperlipidemia, and  anxiety who is being followed for the evaluation of chest pain.  Assessment & Plan    1. MI - EKG on 04/09/19 - may meet STEMI criteria - decision was made to treat medically - heparin for 48 hr - isordil 10 mg BID - lopressor 50 mg BID - lipitor 40 mg daily   2. Persistent atrial fibrillation - borderline rate controlled - 90-110s - consider increasing lopressor to 75 mg BID - heparin running   3. Acute systolic heart failure - pt presented with MI - reduced EF may be ischemic - on 20 mg lasix - sCr 0.89 with L 4.0 - no potassium supplement - overall 90 L net negative, I&Os may not be completed charted at this time   Disposition I have consulted palliative care medicine for at least goals of care discussion - may need hospice. She lives alone but has had recent trouble eating and taking medications regularly. Also, several falls with increased weakness.   PT has seen and recommends SNF        For questions or updates, please contact [facility] Please consult [email or web address] for contact info under        Signed, Marcelino Duster, PA  04/11/2019, 8:15 AM    I have examined the patient and reviewed assessment and plan and discussed with patient.  Agree with above as stated.  Please see my note as well from today.  Lance Muss

## 2019-04-11 NOTE — TOC Initial Note (Signed)
Transition of Care [facility]) - Initial/Assessment Note    Patient Details  Name: Ann Booth MRN: [id number] Date of Birth: 07/17/1926  Transition of Care [facility]) CM/SW Contact:    Arvella Merles, LCSW Phone Number: 04/11/2019, 11:09 AM  Clinical Narrative:                 CSW received consult for possible SNF placement at time of discharge. CSW spoke with patient's sister Edrick Kins [phone] regarding PT recommendation of SNF placement at time of discharge. Patient's sister expressed understanding of PT recommendation and is agreeable to SNF placement at time of discharge. She stated patient's nephew Jeanell Sparrow [phone] would be the best contact to discuss the discharge plan.  CSW contacted Ray and discussed the recommendation. Ray stated he agrees with the recommendation. Patient's nephew reports preference for [facility], [facility] or [facility]. CSW discussed insurance authorization process and provided Medicare SNF ratings list. No further questions reported at this time. CSW to continue to follow and assist with discharge planning needs.   Expected Discharge Plan: Skilled Nursing Facility Barriers to Discharge: No Barriers Identified   Patient Goals and CMS Choice   CMS Medicare.gov Compare Post Acute Care list provided to:: Patient Represenative (must comment)(Ray, nephew) Choice offered to / list presented to : Adult Children(Ray, nephew)  Expected Discharge Plan and Services Expected Discharge Plan: [place] arrangements for the past 2 months: Single Family Home                                      Prior Living Arrangements/Services Living arrangements for the past 2 months: Single Family Home Lives with:: Self Patient language and need for interpreter reviewed:: Yes Do you feel safe going back to the place where you live?: Yes      Need for Family Participation in Patient Care: Yes (Comment) Care giver support system in place?:  Yes (comment)   Criminal Activity/Legal Involvement Pertinent to Current Situation/Hospitalization: No - Comment as needed  Activities of Daily Living Home Assistive Devices/Equipment: Cane (specify quad or straight) ADL Screening (condition at time of admission) Patient's cognitive ability adequate to safely complete daily activities?: Yes Is the patient deaf or have difficulty hearing?: No Does the patient have difficulty seeing, even when wearing glasses/contacts?: No Does the patient have difficulty concentrating, remembering, or making decisions?: No Patient able to express need for assistance with ADLs?: Yes Does the patient have difficulty dressing or bathing?: No Independently performs ADLs?: Yes (appropriate for developmental age) Does the patient have difficulty walking or climbing stairs?: Yes Weakness of Legs: Both Weakness of Arms/Hands: None  Permission Sought/Granted Permission sought to share information with : Facility Sport and exercise psychologist, Family Supports    Share Information with NAME: Ray  Permission granted to share info w AGENCY: SNF  Permission granted to share info w Relationship: Nephew  Permission granted to share info w Contact Information: [phone]  Emotional Assessment   Attitude/Demeanor/Rapport: Unable to Assess Affect (typically observed): Unable to Assess Orientation: : Oriented to Self Alcohol / Substance Use: Not Applicable Psych Involvement: No (comment)  Admission diagnosis:  NSTEMI (non-ST elevated myocardial infarction) [facility]) [I21.4] Patient Active Problem List   Diagnosis Date Noted  . NSTEMI (non-ST elevated myocardial infarction) ([place]) 04/08/2019  . Memory changes 03/22/2019  . Delusional thoughts ([place]) 03/22/2019  . Atrial fibrillation with RVR ([place]) 07/02/2018  .  Recurrent falls while walking 06/17/2018  . Urinary incontinence 06/17/2018  . Thoracic spine pain 04/05/2018  . Hyperglycemia 04/05/2018  . Falls frequently  03/27/2018  . Other fatigue 03/10/2018  . Degenerative arthritis of left knee 06/25/2017  . Peripheral vascular disease (HCC) 05/21/2017  . Rib pain 04/24/2017  . Chest pain at rest 03/16/2017  . Callus of foot 05/26/2016  . Right foot pain 03/21/2016  . Allergic conjunctivitis 03/21/2016  . Insomnia 07/28/2013  . Arthritis of sacroiliac joint 03/30/2013  . Leg length discrepancy 03/30/2013  . N&V (nausea and vomiting) 01/27/2013  . Atrial fibrillation (HCC) 11/01/2012  . Conjunctivitis 07/21/2012  . Carotid stenosis 04/27/2012  . Chronic lower back pain 04/27/2012  . Idiopathic scoliosis 04/27/2012  . Black stools 10/25/2011  . HTN (hypertension) 08/23/2010  . Preventative health care 07/25/2010  . Peripheral edema 07/25/2010  . Hypercalcemia 11/09/2008  . POSITIONAL VERTIGO 11/09/2008  . BACK PAIN 11/09/2008  . Peripheral neuropathic pain 06/01/2007  . Allergic rhinitis 06/01/2007  . SHOULDER PAIN, BILATERAL 06/01/2007  . FATIGUE 06/01/2007  . Hyperlipidemia 10/18/2006  . Anxiety state 10/18/2006  . Depression 10/18/2006  . Unspecified glaucoma 10/15/2006  . CATARACT NOS 10/15/2006  . Mitral valve disorder 10/15/2006  . Asthma 10/15/2006  . Rosacea 10/15/2006  . Osteoporosis 10/15/2006   PCP:  Corwin Levins, MD Pharmacy:   [facility] [address] [place]), [place] - [address] [place] [address] Phone: [phone] Fax: [phone]     Social Determinants of Health (SDOH) Interventions    Readmission Risk Interventions No flowsheet data found.

## 2019-04-11 NOTE — Progress Notes (Signed)
Patient's left pupil is 4 mm, round, brisk; this was a noted change from previous assessment of 3 mm, round, brisk. Right pupil 3 mm, round, brisk. Patient was very lethargic and unable to follow simple commands. Will continue to monitor.  Bari Edward, RN

## 2019-04-12 DIAGNOSIS — D649 Anemia, unspecified: Secondary | ICD-10-CM | POA: Diagnosis not present

## 2019-04-12 DIAGNOSIS — M6281 Muscle weakness (generalized): Secondary | ICD-10-CM | POA: Diagnosis not present

## 2019-04-12 DIAGNOSIS — I4819 Other persistent atrial fibrillation: Secondary | ICD-10-CM | POA: Diagnosis not present

## 2019-04-12 DIAGNOSIS — I4821 Permanent atrial fibrillation: Secondary | ICD-10-CM | POA: Diagnosis not present

## 2019-04-12 DIAGNOSIS — Z743 Need for continuous supervision: Secondary | ICD-10-CM | POA: Diagnosis not present

## 2019-04-12 DIAGNOSIS — I509 Heart failure, unspecified: Secondary | ICD-10-CM | POA: Diagnosis not present

## 2019-04-12 DIAGNOSIS — R278 Other lack of coordination: Secondary | ICD-10-CM | POA: Diagnosis not present

## 2019-04-12 DIAGNOSIS — R6889 Other general symptoms and signs: Secondary | ICD-10-CM | POA: Diagnosis not present

## 2019-04-12 DIAGNOSIS — F411 Generalized anxiety disorder: Secondary | ICD-10-CM | POA: Diagnosis not present

## 2019-04-12 DIAGNOSIS — R296 Repeated falls: Secondary | ICD-10-CM | POA: Diagnosis not present

## 2019-04-12 DIAGNOSIS — I10 Essential (primary) hypertension: Secondary | ICD-10-CM | POA: Diagnosis not present

## 2019-04-12 DIAGNOSIS — Z515 Encounter for palliative care: Secondary | ICD-10-CM

## 2019-04-12 DIAGNOSIS — Z7189 Other specified counseling: Secondary | ICD-10-CM

## 2019-04-12 DIAGNOSIS — I5032 Chronic diastolic (congestive) heart failure: Secondary | ICD-10-CM | POA: Diagnosis not present

## 2019-04-12 DIAGNOSIS — E785 Hyperlipidemia, unspecified: Secondary | ICD-10-CM | POA: Diagnosis not present

## 2019-04-12 DIAGNOSIS — R627 Adult failure to thrive: Secondary | ICD-10-CM | POA: Diagnosis not present

## 2019-04-12 DIAGNOSIS — I213 ST elevation (STEMI) myocardial infarction of unspecified site: Secondary | ICD-10-CM | POA: Diagnosis not present

## 2019-04-12 DIAGNOSIS — F0390 Unspecified dementia without behavioral disturbance: Secondary | ICD-10-CM | POA: Diagnosis not present

## 2019-04-12 DIAGNOSIS — R279 Unspecified lack of coordination: Secondary | ICD-10-CM | POA: Diagnosis not present

## 2019-04-12 DIAGNOSIS — Z7901 Long term (current) use of anticoagulants: Secondary | ICD-10-CM | POA: Diagnosis not present

## 2019-04-12 DIAGNOSIS — R2681 Unsteadiness on feet: Secondary | ICD-10-CM | POA: Diagnosis not present

## 2019-04-12 DIAGNOSIS — Z20828 Contact with and (suspected) exposure to other viral communicable diseases: Secondary | ICD-10-CM | POA: Diagnosis not present

## 2019-04-12 DIAGNOSIS — R41841 Cognitive communication deficit: Secondary | ICD-10-CM | POA: Diagnosis not present

## 2019-04-12 DIAGNOSIS — I5021 Acute systolic (congestive) heart failure: Secondary | ICD-10-CM | POA: Diagnosis not present

## 2019-04-12 DIAGNOSIS — R2689 Other abnormalities of gait and mobility: Secondary | ICD-10-CM | POA: Diagnosis not present

## 2019-04-12 DIAGNOSIS — I4891 Unspecified atrial fibrillation: Secondary | ICD-10-CM | POA: Diagnosis not present

## 2019-04-12 DIAGNOSIS — I340 Nonrheumatic mitral (valve) insufficiency: Secondary | ICD-10-CM | POA: Diagnosis not present

## 2019-04-12 DIAGNOSIS — J189 Pneumonia, unspecified organism: Secondary | ICD-10-CM | POA: Diagnosis not present

## 2019-04-12 DIAGNOSIS — E039 Hypothyroidism, unspecified: Secondary | ICD-10-CM | POA: Diagnosis not present

## 2019-04-12 DIAGNOSIS — S42002A Fracture of unspecified part of left clavicle, initial encounter for closed fracture: Secondary | ICD-10-CM | POA: Diagnosis not present

## 2019-04-12 DIAGNOSIS — R404 Transient alteration of awareness: Secondary | ICD-10-CM | POA: Diagnosis not present

## 2019-04-12 DIAGNOSIS — M25552 Pain in left hip: Secondary | ICD-10-CM | POA: Diagnosis not present

## 2019-04-12 DIAGNOSIS — E782 Mixed hyperlipidemia: Secondary | ICD-10-CM | POA: Diagnosis not present

## 2019-04-12 DIAGNOSIS — R05 Cough: Secondary | ICD-10-CM | POA: Diagnosis not present

## 2019-04-12 DIAGNOSIS — I219 Acute myocardial infarction, unspecified: Secondary | ICD-10-CM | POA: Diagnosis not present

## 2019-04-12 DIAGNOSIS — I214 Non-ST elevation (NSTEMI) myocardial infarction: Secondary | ICD-10-CM | POA: Diagnosis not present

## 2019-04-12 DIAGNOSIS — E559 Vitamin D deficiency, unspecified: Secondary | ICD-10-CM | POA: Diagnosis not present

## 2019-04-12 DIAGNOSIS — R0602 Shortness of breath: Secondary | ICD-10-CM | POA: Diagnosis not present

## 2019-04-12 LAB — CBC
HCT: 37.1 % (ref 36.0–46.0)
Hemoglobin: 12.2 g/dL (ref 12.0–15.0)
MCH: 32.7 pg (ref 26.0–34.0)
MCHC: 32.9 g/dL (ref 30.0–36.0)
MCV: 99.5 fL (ref 80.0–100.0)
Platelets: 166 10*3/uL (ref 150–400)
RBC: 3.73 MIL/uL — ABNORMAL LOW (ref 3.87–5.11)
RDW: 13.8 % (ref 11.5–15.5)
WBC: 7.1 10*3/uL (ref 4.0–10.5)
nRBC: 0.3 % — ABNORMAL HIGH (ref 0.0–0.2)

## 2019-04-12 LAB — SARS CORONAVIRUS 2 (TAT 6-24 HRS): SARS Coronavirus 2: NEGATIVE

## 2019-04-12 LAB — BASIC METABOLIC PANEL
Anion gap: 14 (ref 5–15)
BUN: 24 mg/dL — ABNORMAL HIGH (ref 8–23)
CO2: 24 mmol/L (ref 22–32)
Calcium: 9.1 mg/dL (ref 8.9–10.3)
Chloride: 100 mmol/L (ref 98–111)
Creatinine, Ser: 0.86 mg/dL (ref 0.44–1.00)
GFR calc Af Amer: >60 mL/min (ref >60)
GFR calc non Af Amer: 59 mL/min — ABNORMAL LOW (ref >60)
Glucose, Bld: 108 mg/dL — ABNORMAL HIGH (ref 70–99)
Potassium: 3.4 mmol/L — ABNORMAL LOW (ref 3.5–5.1)
Sodium: 138 mmol/L (ref 135–145)

## 2019-04-12 MED ORDER — NITROGLYCERIN 0.4 MG SL SUBL: 0.4000 mg | SUBLINGUAL_TABLET | SUBLINGUAL | 12 refills | Status: AC | PRN

## 2019-04-12 MED ORDER — ATORVASTATIN CALCIUM 40 MG PO TABS: 40.0000 mg | ORAL_TABLET | Freq: Every day | ORAL | Status: AC

## 2019-04-12 MED ORDER — METOPROLOL TARTRATE 50 MG PO TABS: 50.0000 mg | ORAL_TABLET | Freq: Two times a day (BID) | ORAL | Status: AC

## 2019-04-12 MED ORDER — ISOSORBIDE DINITRATE 10 MG PO TABS: 10.0000 mg | ORAL_TABLET | Freq: Two times a day (BID) | ORAL | Status: DC

## 2019-04-12 MED ORDER — ASPIRIN 81 MG PO TBEC: 81.0000 mg | DELAYED_RELEASE_TABLET | Freq: Every day | ORAL | Status: DC

## 2019-04-12 NOTE — Consult Note (Signed)
Consultation Note Date: 04/12/2019   Patient Name: Ann Booth  DOB: 12-06-1926  MRN: [id number]  Age / Sex: 86 y.o., female  PCP: Biagio Borg, MD Referring Physician: Donato Heinz  Reason for Consultation: Establishing goals of care  HPI/Patient Profile: 86 y.o. female  with past medical history of a fib on xarelto, moderate MR, severe TR, chronic diastolic CHF, HTN, HLD, and anxiety admitted on 04/08/2019 with chest pain. Diagnosed with acute anterior MI. No heart failure symptoms. No invasive testing planned. PMT consulted for [place].   Clinical Assessment and Goals of Care: I have reviewed medical records including EPIC notes, labs and imaging, received report from RN, assessed the patient and then spoke with patient's nephew (ray) and his spouse Nurse, children's)  to discuss diagnosis prognosis, GOC, EOL wishes, disposition and options.  When I met with patient she was sitting up in the chair eating - pleasant and appeared comfortable. She answers my questions appropriately. She does tell me that she is sleepy. She is oriented to place and situation. Understands that the plan is to go to rehab and she is agreeable to this as well. She gives me permission to speak to her nephew regarding goals of care.   Ray was unable to participate in entire conversation - he gave permission to speak with his wife Henri - Ardath Sax is a Marine scientist and very involved in medical decisions for [name].   I introduced Palliative Medicine as specialized medical care for people living with serious illness. It focuses on providing relief from the symptoms and stress of a serious illness. The goal is to improve quality of life for both the patient and the family.  We discussed a brief life review of the patient. Ms Pilkenton has lived home alone for the past 6-7 years after her husband passed away. She does not have children. Supportive family of siblings (in their 23s)  and her nephew and his wife.   As far as functional and nutritional status, Henri tells me the patient has been getting weaker at home and had a few falls. She was still able to care for herself - toileting and bathing independently. She is having more frequent periods of confusion. Has recently stopped taking several meds - did continue her anticoagulation and statin.    We discussed her current illness and what it means in the larger context of her on-going co-morbidities.  Natural disease trajectory and expectations at EOL were discussed. Henri has good understanding of medical condition - we reviewed her diagnoses and all questions were answered.   I attempted to elicit values and goals of care important to the patient.  Henri notes that the patient would very much like to be at home; however, at this time that is not feasible as there is no one available to provide the care she needs currently.   The difference between aggressive medical intervention and comfort care was considered in light of the patient's goals of care. I encouraged Henri to discuss with Ray continued goals of care for Ms. Raether - are repeated hospitalizations in her best interest or should care shift to focus more on comfort and relief of suffering? Henri tells me they had already been speaking about these issues and will continues these conversations. Ray is not available today to continue these conversations or complete a MOST form - we discussed referral to outpatient palliative to assist in goals of care conversations to which Ardath Sax is agreeable.   Advance directives, concepts  specific to code status, artifical feeding and hydration, and rehospitalization were considered and discussed. Confirmed DNR status.   Hospice and Palliative Care services outpatient were explained and offered. Discussed hospice care - discussed that [name] is likely eligible for their services - will see how she does in/after rehab. Will refer  to outpatient palliative to see while she is in rehab - Henri agrees. Discussed hospice care at home vs hospice in LTC.   Questions and concerns were addressed. The family was encouraged to call with questions or concerns.    Primary Decision Maker HCPOA - nephew Eual Fines joined by his wife, Henri    SUMMARY OF RECOMMENDATIONS   - SNF with palliative to follow outpatient - GOC addressed - family considering continued aggressive care/repeated hospitalizations vs shift to comfort focused care - HCPOA not available to complete MOST - can be addressed with outpatient palliative - discussed hospice care - will see how patient does with rehab  Code Status/Advance Care Planning:  DNR  Psycho-social/Spiritual:   Desire for further Chaplaincy support:no  Additional Recommendations: Education on Hospice  Prognosis:   Unable to determine  Discharge Planning: [place] for rehab with Palliative care service follow-up      Primary Diagnoses: Present on Admission: . NSTEMI (non-ST elevated myocardial infarction) ([place])   I have reviewed the medical record, interviewed the patient and family, and examined the patient. The following aspects are pertinent.  Past Medical History:  Diagnosis Date  . ALLERGIC RHINITIS   . ANXIETY   . ASTHMA   . Atrial fibrillation ([place])   . BACK PAIN   . Carotid stenosis 04/27/2012   Mild left on community screening  - Nov 2013  . CATARACT NOS   . DEPRESSION   . GLAUCOMA NOS   . HTN (hypertension)   . Hypercalcemia   . MITRAL VALVE PROLAPSE   . OSTEOPOROSIS   . PERIPHERAL NEUROPATHY   . POSITIONAL VERTIGO   . Rosacea    Social History   Socioeconomic History  . Marital status: Widowed    Spouse name: Not on file  . Number of children: 0  . Years of education: Not on file  . Highest education level: Not on file  Occupational History  . Occupation: retired Designer, industrial/product: RETIRED  Tobacco Use  . Smoking status:  Never Smoker  . Smokeless tobacco: Never Used  Substance and Sexual Activity  . Alcohol use: No  . Drug use: No  . Sexual activity: Never  Other Topics Concern  . Not on file  Social History Narrative  . Not on file   Social Determinants of Health   Financial Resource Strain:   . Difficulty of Paying Living Expenses: Not on file  Food Insecurity:   . Worried About Charity fundraiser in the Last Year: Not on file  . Ran Out of Food in the Last Year: Not on file  Transportation Needs:   . Lack of Transportation (Medical): Not on file  . Lack of Transportation (Non-Medical): Not on file  Physical Activity:   . Days of Exercise per Week: Not on file  . Minutes of Exercise per Session: Not on file  Stress:   . Feeling of Stress : Not on file  Social Connections:   . Frequency of Communication with Friends and Family: Not on file  . Frequency of Social Gatherings with Friends and Family: Not on file  . Attends Religious Services: Not on  file  . Active Member of Clubs or Organizations: Not on file  . Attends Archivist Meetings: Not on file  . Marital Status: Not on file   Family History  Problem Relation Age of Onset  . CAD Mother        MI at age 45  . Cancer Sister        colon  . Cancer Brother        prostate cancer   Scheduled Meds: . aspirin EC  81 mg Oral Daily  . atorvastatin  40 mg Oral q1800  . citalopram  10 mg Oral Daily  . donepezil  5 mg Oral QHS  . furosemide  20 mg Oral Daily  . isosorbide dinitrate  10 mg Oral BID  . metoprolol tartrate  50 mg Oral BID  . rivaroxaban  15 mg Oral Q supper  . sodium chloride flush  3 mL Intravenous Q12H   Continuous Infusions: . sodium chloride     PRN Meds:.sodium chloride, acetaminophen, ALPRAZolam, nitroGLYCERIN, ondansetron (ZOFRAN) IV, sodium chloride flush, zolpidem Allergies  Allergen Reactions  . Cymbalta [Duloxetine Hcl] Other (See Comments)   Review of Systems  Constitutional: Positive for  activity change and fatigue.  Cardiovascular: Negative for chest pain and leg swelling.    Physical Exam Constitutional:      General: She is not in acute distress. HENT:     Head: Normocephalic and atraumatic.  Cardiovascular:     Rate and Rhythm: Rhythm irregular.  Pulmonary:     Effort: Pulmonary effort is normal.     Breath sounds: Normal breath sounds.  Abdominal:     Palpations: Abdomen is soft.  Musculoskeletal:     Right lower leg: No edema.     Left lower leg: No edema.  Skin:    General: Skin is warm and dry.  Neurological:     Mental Status: She is alert and oriented to person, place, and time.  Psychiatric:        Mood and Affect: Mood normal.        Behavior: Behavior normal.     Vital Signs: BP 115/88   Pulse (!) 107   Temp 97.8 F (36.6 C) (Oral)   Resp 20   Ht _0  (1.575 m)   Wt 51.7 kg   SpO2 100%   BMI 20.85 kg/m  Pain Scale: 0-10   Pain Score: Asleep   SpO2: SpO2: 100 % O2 Device:SpO2: 100 % O2 Flow Rate: .O2 Flow Rate (L/min): 2 L/min  IO: Intake/output summary:   Intake/Output Summary (Last 24 hours) at 04/12/2019 1335 Last data filed at 04/12/2019 1016 Gross per 24 hour  Intake 690 ml  Output --  Net 690 ml    LBM: Last BM Date: 04/11/19 Baseline Weight: Weight: 53.9 kg Most recent weight: Weight: 51.7 kg     Palliative Assessment/Data: PPS 50%    Time Total: 85 minutes Greater than 50%  of this time was spent counseling and coordinating care related to the above assessment and plan.  Juel Burrow, DNP, AGNP-C Palliative Medicine Team [phone] Pager: [phone]

## 2019-04-12 NOTE — TOC Progression Note (Signed)
Transition of Care [facility]) - Progression Note    Patient Details  Name: Ann Booth MRN: [id number] Date of Birth: 01/11/1927  Transition of Care [facility]) CM/SW Contact  Nonda Lou, [place] Phone Number: 04/12/2019, 11:06 AM  Clinical Narrative:    CSW received call from with [employer] with [facility] [phone]. Confirmed bed availability for patient. Janie asked if family could complete paperwork at 1:30pm. CSW informed [employer] authorization is still pending and she will be updated once approved.  CSW contacted patient's nephew Rosalia Hammers [phone] and informed him of bed availability. Ray confirmed he can complete paperwork at 1:30pm. CSW informed patient nephew insurance authorization is still pending and he will be updated on changes.   CSW contact Janie with [facility] and confirmed patient's nephew Rosalia Hammers will complete paperwork at 1:30p. CSW agreed to provide update once insurance authorization received.  CSW will continue to follow and assist with discharge needs.  Expected Discharge Plan: Skilled Nursing Facility Barriers to Discharge: No Barriers Identified  Expected Discharge Plan and Services Expected Discharge Plan: Skilled Nursing Facility       Living arrangements for the past 2 months: Single Family Home                                       Social Determinants of Health (SDOH) Interventions    Readmission Risk Interventions No flowsheet data found.

## 2019-04-12 NOTE — TOC Progression Note (Signed)
Transition of Care [facility]) - Progression Note    Patient Details  Name: Ann Booth MRN: [id number] Date of Birth: 1926/04/03  Transition of Care [facility]) CM/SW Contact  Nonda Lou, [place] Phone Number: [phone] 04/12/2019, 3:55 PM  Clinical Narrative:    CSW received [employer] insurance authorization. [facility] SNF authorization [phone], ambulance authorization [phone]. CSW informed doctor and Wille Celeste with [facility] of authorization. DNR on chart, needs to be signed by doctor.   Expected Discharge Plan: Skilled Nursing Facility Barriers to Discharge: No Barriers Identified  Expected Discharge Plan and Services Expected Discharge Plan: Skilled Nursing Facility       Living arrangements for the past 2 months: Single Family Home                                       Social Determinants of Health (SDOH) Interventions    Readmission Risk Interventions No flowsheet data found.

## 2019-04-12 NOTE — Progress Notes (Signed)
Progress Note  Patient Name: Ann Booth Date of Encounter: 04/12/2019  Primary Cardiologist: Kirk Ruths, MD   Subjective   No complaints; difficult to wake.   Inpatient Medications    Scheduled Meds: . aspirin EC  81 mg Oral Daily  . atorvastatin  40 mg Oral q1800  . citalopram  10 mg Oral Daily  . donepezil  5 mg Oral QHS  . furosemide  20 mg Oral Daily  . isosorbide dinitrate  10 mg Oral BID  . metoprolol tartrate  50 mg Oral BID  . rivaroxaban  15 mg Oral Q supper  . sodium chloride flush  3 mL Intravenous Q12H   Continuous Infusions: . sodium chloride     PRN Meds: sodium chloride, acetaminophen, ALPRAZolam, nitroGLYCERIN, ondansetron (ZOFRAN) IV, sodium chloride flush, zolpidem   Vital Signs    Vitals:   04/11/19 2101 04/11/19 2159 04/11/19 2203 04/12/19 0955  BP: 108/79 116/83  115/88  Pulse:   81 (!) 107  Resp:      Temp:  97.8 F (36.6 C)    TempSrc:  Oral    SpO2:      Weight:      Height:        Intake/Output Summary (Last 24 hours) at 04/12/2019 1109 Last data filed at 04/12/2019 1016 Gross per 24 hour  Intake 690 ml  Output --  Net 690 ml   Last 3 Weights 04/11/2019 04/10/2019 04/09/2019  Weight (lbs) 113 lb 15.7 oz 113 lb 8.6 oz 114 lb 10.2 oz  Weight (kg) 51.7 kg 51.5 kg 52 kg      Telemetry    AFib - Personally Reviewed  ECG      Physical Exam   GEN: No acute distress.  Difficult to arouse Neck: No JVD Cardiac: irregularly irregular, no murmurs, rubs, or gallops.  Respiratory: Clear to auscultation bilaterally.  Comfortable while lying flat GI: Soft, nontender, non-distended  MS: No edema; No deformity. Neuro:  Nonfocal  Psych: Unable to assess  Labs    High Sensitivity Troponin:   Recent Labs  Lab 04/08/19 1424 04/08/19 1653  TROPONINIHS 23,357* >27,000.0*      Chemistry Recent Labs  Lab 04/10/19 0359 04/11/19 0337 04/12/19 0456  NA 142 138 138  K 4.8 4.0 3.4*  CL 102 101 100  CO2 27 24 24   GLUCOSE  130* 121* 108*  BUN 21 28* 24*  CREATININE 0.89 0.89 0.86  CALCIUM 9.5 9.1 9.1  GFRNONAA 57* 57* 59*  GFRAA >60 >60 >60  ANIONGAP 13 13 14      Hematology Recent Labs  Lab 04/10/19 0359 04/11/19 0337 04/12/19 0456  WBC 10.7* 8.5 7.1  RBC 4.06 3.96 3.73*  HGB 13.1 12.9 12.2  HCT 40.1 38.8 37.1  MCV 98.8 98.0 99.5  MCH 32.3 32.6 32.7  MCHC 32.7 33.2 32.9  RDW 13.8 13.7 13.8  PLT 156 153 166    BNP Recent Labs  Lab 04/08/19 1653  BNP 476.7*     DDimer  Recent Labs  Lab 04/08/19 1653  DDIMER 0.63*     Radiology    No results found.  Cardiac Studies   None new  Patient Profile     86 y.o. female recent MI  Assessment & Plan    Recent anterior MI: She appears euvolemic.  She is not experiencing any heart failure symptoms.  No angina reported.  Discussed with family previously.  Planning for symptom management.  No invasive testing planned.  Biggest issue at this point is disposition.  Palliative care consult pending.  Also social work is looking for a [facility] [employer] facility.  Could consider hospice as well.  AFib. Rate controlled. Back on oral anticoagulation.    For questions or updates, please contact [facility] Please consult [email or web address] for contact info under        Signed, Lance Muss, MD  04/12/2019, 11:09 AM

## 2019-04-12 NOTE — TOC Transition Note (Signed)
Transition of Care [facility]) - CM/SW Discharge Note   Patient Details  Name: Ann Booth MRN: [id number] Date of Birth: [birth year]  Transition of Care [facility]) CM/SW Contact:  Nonda Lou, [place] Phone Number: [phone] 04/12/2019, 4:54 PM   Clinical Narrative:    Patient will DC to: [facility]  Anticipated DC date: 04/12/19 Family notified: Nephew Ray [phone], left vm.  Transport by: Sharin Mons   Per MD patient ready for DC to [facility]. RN, patient, patient's family, and facility notified of DC. Discharge Summary and FL2 sent to facility. RN to call report prior to discharge [phone] room 3206). DC packet on chart. Ambulance transport requested for patient.   CSW will sign off for now as social work intervention is no longer needed. Please consult [place] again if new needs arise.    Final next level of care: Skilled Nursing Facility Barriers to Discharge: No Barriers Identified   Patient Goals and CMS Choice   CMS Medicare.gov Compare Post Acute Care list provided to:: Patient Represenative (must comment)(Ray, nephew) Choice offered to / list presented to : Adult Children  Discharge Placement                Patient to be transferred to facility by: [facility] Name of family member notified: Ray, nephew Patient and family notified of of transfer: 04/12/19  Discharge Plan and Services                                     Social Determinants of Health (SDOH) Interventions     Readmission Risk Interventions No flowsheet data found.

## 2019-04-12 NOTE — Plan of Care (Signed)
  Problem: Elimination: Goal: Will not experience complications related to bowel motility Outcome: Progressing Goal: Will not experience complications related to urinary retention Outcome: Progressing   Problem: Clinical Measurements: Goal: Will remain free from infection Outcome: Progressing Goal: Respiratory complications will improve Outcome: Progressing

## 2019-04-12 NOTE — Progress Notes (Signed)
Physical Therapy Treatment Patient Details Name: COREENA RUBALCAVA MRN: [id number] DOB: [birth year] Today's Date: 04/12/2019    History of Present Illness 86 y.o. female w/ hx persistent atrial fibrillation on Xarelto, moderate MR, moderate to severe TR, chronic diastolic CHF (preserved EF 2019), hypertension, hyperlipidemia, and  anxiety who was admitted 02/12 for chest pain. ECG 04/09/19 may meet STEMI criteria, but decision has been made for medical management.     PT Comments    Patient progressing with mobility this session able to ambulate with +2 A for safety with chair following.  Tolerated well while on RA with SpO2 97%.  Still confused and not aware of location or situation.  She remains appropriate for SNF level rehab at d/c.    Follow Up Recommendations  SNF;Supervision/Assistance - 24 hour     Equipment Recommendations  3in1 (PT);Wheelchair (measurements PT)    Recommendations for Other Services       Precautions / Restrictions Precautions Precautions: Fall    Mobility  Bed Mobility Overal bed mobility: Needs Assistance Bed Mobility: Supine to Sit     Supine to sit: Mod assist     General bed mobility comments: assist to initiate moving legs off bed and to lift trunk upright  Transfers Overall transfer level: Needs assistance Equipment used: Rolling walker (2 wheeled) Transfers: Sit to/from Stand Sit to Stand: Mod assist;+2 safety/equipment         General transfer comment: lifting help to stand from EOB  Ambulation/Gait Ambulation/Gait assistance: Min assist;Mod assist;+2 safety/equipment Gait Distance (Feet): 12 Feet Assistive device: Rolling walker (2 wheeled) Gait Pattern/deviations: Step-through pattern;Step-to pattern;Decreased stride length;Shuffle;Trunk flexed;Scissoring     General Gait Details: initial step crossing L leg over R and assist to reposition, then stepping with assist for lateral weight shift chair follow to door   Stairs              Wheelchair Mobility    Modified Rankin (Stroke Patients Only)       Balance Overall balance assessment: Needs assistance   Sitting balance-Leahy Scale: Fair       Standing balance-Leahy Scale: Poor Standing balance comment: reliant on external support                            Cognition Arousal/Alertness: Awake/alert Behavior During Therapy: WFL for tasks assessed/performed Overall Cognitive Status: Impaired/Different from baseline Area of Impairment: Orientation                 Orientation Level: Disoriented to;Place;Time;Situation Current Attention Level: Sustained   Following Commands: Follows one step commands consistently;Follows one step commands with increased time     Problem Solving: Slow processing;Decreased initiation;Requires verbal cues        Exercises      General Comments General comments (skin integrity, edema, etc.): SpO2 after ambulation on RA 97%, but replaced O2 at rest      Pertinent Vitals/Pain Pain Assessment: No/denies pain    Home Living                      Prior Function            PT Goals (current goals can now be found in the care plan section) Progress towards PT goals: Progressing toward goals    Frequency    Min 3X/week      PT Plan Current plan remains appropriate    Co-evaluation  AM-PAC PT "6 Clicks" Mobility   Outcome Measure  Help needed turning from your back to your side while in a flat bed without using bedrails?: A Lot Help needed moving from lying on your back to sitting on the side of a flat bed without using bedrails?: A Lot Help needed moving to and from a bed to a chair (including a wheelchair)?: A Lot Help needed standing up from a chair using your arms (e.g., wheelchair or bedside chair)?: A Lot Help needed to walk in hospital room?: A Lot Help needed climbing 3-5 steps with a railing? : Total 6 Click Score: 11    End of Session  Equipment Utilized During Treatment: Gait belt Activity Tolerance: Patient tolerated treatment well Patient left: in chair;with call bell/phone within reach;with chair alarm set Nurse Communication: Mobility status PT Visit Diagnosis: Other abnormalities of gait and mobility (R26.89);Muscle weakness (generalized) (M62.81);Other symptoms and signs involving the nervous system (R29.898)     Time: 1040-1106 PT Time Calculation (min) (ACUTE ONLY): 26 min  Charges:  $Gait Training: 8-22 mins $Therapeutic Activity: 8-22 mins                     Sheran Lawless, PT Acute Rehabilitation Services [phone] 04/12/2019    Elray Mcgregor 04/12/2019, 6:11 PM

## 2019-04-12 NOTE — Progress Notes (Signed)
CSW contacted [facility] [phone] to follow-up with patient insurance authorization. CSW informed insurance is still under review.  CSW informed Wille Celeste with [facility] the authorization is still pending. Admission paperwork has been completed by patient's nephew Ray.  Verlon Au, [place] [phone]

## 2019-04-12 NOTE — Discharge Summary (Addendum)
Discharge Summary    Patient ID: Ann Booth MRN: [id number]; DOB: 02-Jan-1927  Admit date: 04/08/2019 Discharge date: 04/12/2019  Primary Care Provider: Corwin Levins, MD  Primary Cardiologist: Olga Millers, MD  Primary Electrophysiologist:  None   Discharge Diagnoses    Principal Problem:   NSTEMI (non-ST elevated myocardial infarction) [facility]) Active Problems:   Hyperlipidemia   HTN (hypertension)   Atrial fibrillation (HCC)   Recurrent falls while walking   Goals of care, counseling/discussion   Palliative care by specialist    Diagnostic Studies/Procedures    Echo 04/08/19: 1. Left ventricular ejection fraction, by estimation, is 30 to 35%. The  left ventricle has moderate to severely decreased function. The left  ventricle demonstrates regional wall motion abnormalities (see scoring  diagram/findings for description). There  is mildly increased left ventricular hypertrophy. Left ventricular  diastolic parameters are indeterminate.   2. Right ventricular systolic function is moderately reduced. The right  ventricular size is normal. There is severely elevated pulmonary artery  systolic pressure. The estimated right ventricular systolic pressure is  73.7 mmHg.   3. Left atrial size was severely dilated.   4. Right atrial size was severely dilated.   5. The mitral valve is degenerative. Moderate to severe mitral valve  regurgitation.   6. The tricuspid valve is abnormal. Tricuspid valve regurgitation is  moderate to severe.   7. The aortic valve is tricuspid. Aortic valve regurgitation is not  visualized. Mild to moderate aortic valve sclerosis/calcification is  present, without any evidence of aortic stenosis.   8. The inferior vena cava is dilated in size with <50% respiratory  variability, suggesting right atrial pressure of 15 mmHg.  _____________   History of Present Illness     Ann Booth is a 86 y.o. female with a hx of persistent atrial  fibrillation on Xarelto, moderate MR, moderate to severe TR, chronic diastolic CHF (preserved EF 2019), hypertension, hyperlipidemia, and  anxiety who is being seen today for the evaluation of chest pain.   Ann Booth is followed by Dr. Jens Som.  Monitor in August 2014 showed paroxysmal atrial fibrillation.  Toprol previously discontinued due to bradycardia.  Is currently rate controlled with Cardizem. Echo November 2019 showed normal LV, prolapse of anterior mitral valve leaflet with moderate mitral regurg, severe left atrial enlargement, moderate severe tricuspid regurgitation. She has reported falls over the last couple years. Was seen in the ER in May 2020 with chest pain that was atypical. Enzymes negative.  An MRI April 2020 showed possible subacute small ischemic infarct.  The patient was last seen 07/09/2018 by Dr. Jens Som reported orthopnea and cough while lying flat. Pedal edema noted on exam. Her Lasix was increased a couple days.    Patient presented to the ED 04/08/2019 by ambulance for chest pain.  She woke up this morning and said she had chest pain.  It is centralized and pressure-like. 8 /10 and radiating to the right side of her chest and into her back.  She has associated shortness of breath.  No nausea or vomiting or diaphoresis.  pain lasted for at least 3 hours before EMS was called.  Her family member who was a Engineer, civil (consulting) called EMS.  No recent fever, chills, illnesses.  Denies recent leg swelling or orthopnea.  She takes Lasix as needed for volume control.    In the ED blood pressure 150/118, pulse 112, afebrile, respiratory rate 25, 96% O2.  EKG noted A. fib rate of 101 bpm.  Labs  showed potassium 3.5, glucose 147, creatinine 0.60.  WBC 6.1, hemoglobin 13.7.  Covid negative.  Chest x-ray showed acute edema or inflammation cannot be excluded. EKG showed Afib with ST elevations in V3 and V4. Cardiology was consulted for further management.  Hospital Course     Consultants:  Palliative  Acute anterior MI Questionable STEMI criteria. Given her late presentation, clinical status, and xarelto administration, decision made to treat medically. She received 48 hr of IV heparin. Lopressor 50 mg BID, isordil 10 mg BID, lasix 20 mg daily, 81 mg ASA, 40 mg lipitor.    Persistent Atrial fibrillation Rate control strategy pursued with BB. Anticoagulated with low dose xarelto 15 mg.    Acute systolic heart failure - new diagnosis Chronic diastolic heart failure EF this admission with reduced EF of 30-35%, previously normal in 2019. She is euvolemic and will discharge on 20 mg lasix PO daily.    Hypertension Continue isordil 10 mg TID and lopressor 50 mg BID.  Held home ARB. Restart at OP follow up as tolerated.   Hyperlipidemia 04/09/2019: Cholesterol 150; HDL 79; LDL Cholesterol 63; Triglycerides 40; VLDL 8 Started 40 mg lipitor.   Pt seen and examined by Dr. Irish Lack and deemed stable for discharge to SNF. Palliative care was consulted for goals of care discussion and will order OP palliative care. DNR order on file.    Did the patient have an acute coronary syndrome (MI, NSTEMI, STEMI, etc) this admission?:  Yes                               AHA/ACC Clinical Performance & Quality Measures: 1. Aspirin prescribed? - Yes 2. ADP Receptor Inhibitor (Plavix/Clopidogrel, Brilinta/Ticagrelor or Effient/Prasugrel) prescribed (includes medically managed patients)? - No - treated medically, no intervention 3. Beta Blocker prescribed? - Yes 4. High Intensity Statin (Lipitor 40-80mg  or Crestor 20-40mg ) prescribed? - Yes 5. EF assessed during THIS hospitalization? - Yes 6. For EF <40%, was ACEI/ARB prescribed? - No - Reason:  held for BP 7. For EF <40%, Aldosterone Antagonist (Spironolactone or Eplerenone) prescribed? - No - Reason:  BP. 8. Cardiac Rehab Phase II ordered (Included Medically managed Patients)? - No - SNF   _____________  Discharge Vitals Blood pressure  92/73, pulse 83, temperature (!) 97.5 F (36.4 C), temperature source Oral, resp. rate 20, height 5\' 2"  (1.575 m), weight 51.7 kg, SpO2 100 %.  Filed Weights   04/09/19 0557 04/10/19 0350 04/11/19 0317  Weight: 52 kg 51.5 kg 51.7 kg    Labs & Radiologic Studies    CBC Recent Labs    04/11/19 0337 04/12/19 0456  WBC 8.5 7.1  HGB 12.9 12.2  HCT 38.8 37.1  MCV 98.0 99.5  PLT 153 762   Basic Metabolic Panel Recent Labs    04/11/19 0337 04/12/19 0456  NA 138 138  K 4.0 3.4*  CL 101 100  CO2 24 24  GLUCOSE 121* 108*  BUN 28* 24*  CREATININE 0.89 0.86  CALCIUM 9.1 9.1   Liver Function Tests No results for input(s): AST, ALT, ALKPHOS, BILITOT, PROT, ALBUMIN in the last 72 hours. No results for input(s): LIPASE, AMYLASE in the last 72 hours. High Sensitivity Troponin:   Recent Labs  Lab 04/08/19 1424 04/08/19 1653  TROPONINIHS 23,357* >27,000.0*    BNP Invalid input(s): POCBNP D-Dimer No results for input(s): DDIMER in the last 72 hours. Hemoglobin A1C No results for input(s): HGBA1C in the  last 72 hours. Fasting Lipid Panel No results for input(s): CHOL, HDL, LDLCALC, TRIG, CHOLHDL, LDLDIRECT in the last 72 hours. Thyroid Function Tests No results for input(s): TSH, T4TOTAL, T3FREE, THYROIDAB in the last 72 hours.  Invalid input(s): FREET3 _____________  DG Chest Port 1 View  Result Date: 04/08/2019 CLINICAL DATA:  Chest pain. EXAM: PORTABLE CHEST 1 VIEW COMPARISON:  Jul 02, 2018. FINDINGS: Stable cardiomediastinal silhouette. No pneumothorax or pleural effusion is noted. Stable interstitial densities are noted most consistent with scarring. Acute superimposed edema or inflammation cannot be excluded. Bony thorax is unremarkable. IMPRESSION: Stable interstitial densities are noted most consistent with scarring, but acute superimposed edema or inflammation cannot be excluded. Electronically Signed   By: Lupita Raider M.D.   On: 04/08/2019 14:59   ECHOCARDIOGRAM  COMPLETE  Result Date: 04/08/2019    ECHOCARDIOGRAM REPORT   Patient Name:   Ann Booth Date of Exam: 04/08/2019 Medical Rec #:  [id number]        Height:       62.0 in Accession #:    [id number]       Weight:       119.0 lb Date of Birth:  1927/01/03         BSA:          1.53 m Patient Age:    86 years         BP:           144/114 mmHg Patient Gender: F                HR:           103 bpm. Exam Location:  Inpatient Procedure: 2D Echo Indications:    NSTEMI I21.4  History:        Patient has prior history of Echocardiogram examinations, most                 recent 12/31/2017. CHF, Mitral Valve Disease, Arrythmias:Atrial                 Fibrillation; Risk Factors:Dyslipidemia and Hypertension.  Sonographer:    Ross Ludwig RDCS (AE) Referring Phys: [id number] CHRISTOPHER L SCHUMANN IMPRESSIONS  1. Left ventricular ejection fraction, by estimation, is 30 to 35%. The left ventricle has moderate to severely decreased function. The left ventricle demonstrates regional wall motion abnormalities (see scoring diagram/findings for description). There is mildly increased left ventricular hypertrophy. Left ventricular diastolic parameters are indeterminate.  2. Right ventricular systolic function is moderately reduced. The right ventricular size is normal. There is severely elevated pulmonary artery systolic pressure. The estimated right ventricular systolic pressure is 73.7 mmHg.  3. Left atrial size was severely dilated.  4. Right atrial size was severely dilated.  5. The mitral valve is degenerative. Moderate to severe mitral valve regurgitation.  6. The tricuspid valve is abnormal. Tricuspid valve regurgitation is moderate to severe.  7. The aortic valve is tricuspid. Aortic valve regurgitation is not visualized. Mild to moderate aortic valve sclerosis/calcification is present, without any evidence of aortic stenosis.  8. The inferior vena cava is dilated in size with <50% respiratory variability, suggesting right atrial  pressure of 15 mmHg. FINDINGS  Left Ventricle: Left ventricular ejection fraction, by estimation, is 30 to 35%. The left ventricle has moderate to severely decreased function. The left ventricle demonstrates regional wall motion abnormalities. There is mildly increased left ventricular hypertrophy. Left ventricular diastolic parameters are indeterminate.  LV Wall Scoring: The mid and distal anterior  wall, mid and distal anterior septum, mid and distal inferior wall, mid inferoseptal segment, and apex are akinetic. The basal anteroseptal segment and basal anterior segment are hypokinetic. The entire lateral wall, basal inferior segment, and basal inferoseptal segment are normal. Right Ventricle: The right ventricular size is normal. No increase in right ventricular wall thickness. Right ventricular systolic function is moderately reduced. There is severely elevated pulmonary artery systolic pressure. The tricuspid regurgitant velocity is 3.83 m/s, and with an assumed right atrial pressure of 15 mmHg, the estimated right ventricular systolic pressure is 73.7 mmHg. Left Atrium: Left atrial size was severely dilated. Right Atrium: Right atrial size was severely dilated. Pericardium: There is no evidence of pericardial effusion. Mitral Valve: The mitral valve is degenerative in appearance. Moderate to severe mitral valve regurgitation. Tricuspid Valve: The tricuspid valve is abnormal. Tricuspid valve regurgitation is moderate to severe. Aortic Valve: The aortic valve is tricuspid. Aortic valve regurgitation is not visualized. Mild to moderate aortic valve sclerosis/calcification is present, without any evidence of aortic stenosis. Pulmonic Valve: The pulmonic valve was not well visualized. Pulmonic valve regurgitation is not visualized. Aorta: The aortic root is normal in size and structure. Venous: The inferior vena cava is dilated in size with less than 50% respiratory variability, suggesting right atrial pressure of  15 mmHg. IAS/Shunts: The interatrial septum was not well visualized.  LEFT VENTRICLE PLAX 2D LVIDd:         3.64 cm LVIDs:         2.53 cm LV PW:         1.22 cm LV IVS:        1.21 cm LVOT diam:     1.80 cm LV SV:         23.51 ml LV SV Index:   21.36 LVOT Area:     2.54 cm  RIGHT VENTRICLE            IVC RV Basal diam:  2.79 cm    IVC diam: 1.95 cm RV S prime:     7.83 cm/s TAPSE (M-mode): 1.2 cm LEFT ATRIUM             Index       RIGHT ATRIUM           Index LA diam:        4.10 cm 2.67 cm/m  RA Area:     22.10 cm LA Vol (A2C):   87.2 ml 56.88 ml/m RA Volume:   69.20 ml  45.14 ml/m LA Vol (A4C):   75.9 ml 49.51 ml/m LA Biplane Vol: 85.2 ml 55.57 ml/m  AORTIC VALVE LVOT Vmax:   60.96 cm/s LVOT Vmean:  40.500 cm/s LVOT VTI:    0.092 m  AORTA Ao Root diam: 2.60 cm Ao Asc diam:  3.00 cm MR Peak grad:    96.8 mmHg   TRICUSPID VALVE MR Mean grad:    66.0 mmHg   TR Peak grad:   58.7 mmHg MR Vmax:         492.00 cm/s TR Vmax:        383.00 cm/s MR Vmean:        390.0 cm/s MR PISA:         5.09 cm    SHUNTS MR PISA Eff ROA: 33 mm      Systemic VTI:  0.09 m MR PISA Radius:  0.90 cm     Systemic Diam: 1.80 cm Epifanio Lesches MD Electronically signed by Epifanio Lesches MD  Signature Date/Time: 04/08/2019/8:57:24 PM    Final    Disposition   Pt is being discharged home today in good condition.  Follow-up Plans & Appointments    Follow-up Information     Abelino Derrick, PA-C Follow up on 04/19/2019.   Specialties: Cardiology, Radiology Why: 9:45 - this is a virtual visit only Contact information: [address] [place] [address] [phone]           Discharge Instructions     Diet - low sodium heart healthy   Complete by: As directed    Increase activity slowly   Complete by: As directed        Discharge Medications   Allergies as of 04/12/2019       Reactions   Cymbalta [duloxetine Hcl] Other (See Comments)        Medication List     STOP taking  these medications    diltiazem 60 MG tablet Commonly known as: CARDIZEM   losartan 50 MG tablet Commonly known as: COZAAR       TAKE these medications    ALPRAZolam 0.5 MG tablet Commonly known as: Xanax 1/2 - 1 tab by mouth at bedtime as needed   aspirin 81 MG EC tablet Take 1 tablet (81 mg total) by mouth daily. Start taking on: April 13, 2019   atorvastatin 40 MG tablet Commonly known as: LIPITOR Take 1 tablet (40 mg total) by mouth daily at 6 PM.   citalopram 10 MG tablet Commonly known as: CeleXA Take 1 tablet (10 mg total) by mouth daily.   donepezil 5 MG tablet Commonly known as: Aricept Take 1 tablet (5 mg total) by mouth at bedtime.   furosemide 20 MG tablet Commonly known as: LASIX TAKE 1 TABLET BY MOUTH ONCE DAILY AS NEEDED FOR SWELLING What changed:   how much to take  how to take this  when to take this  reasons to take this  additional instructions   isosorbide dinitrate 10 MG tablet Commonly known as: ISORDIL Take 1 tablet (10 mg total) by mouth 2 (two) times daily.   lidocaine 5 % Commonly known as: Lidoderm Place 1 patch onto the skin daily. Remove & Discard patch within 12 hours or as directed by MD   metoprolol tartrate 50 MG tablet Commonly known as: LOPRESSOR Take 1 tablet (50 mg total) by mouth 2 (two) times daily.   nitroGLYCERIN 0.4 MG SL tablet Commonly known as: NITROSTAT Place 1 tablet (0.4 mg total) under the tongue every 5 (five) minutes x 3 doses as needed for chest pain.   Rivaroxaban 15 MG Tabs tablet Commonly known as: Xarelto Take 1 tablet (15 mg total) by mouth daily with supper.   timolol 0.5 % ophthalmic solution Commonly known as: BETIMOL Place 1 drop into both eyes 2 (two) times daily.   traMADol 50 MG tablet Commonly known as: ULTRAM Take 1 tablet (50 mg total) by mouth every 6 (six) hours as needed.           Outstanding Labs/Studies     Duration of Discharge Encounter   Greater than 30  minutes including physician time.  Signed, Roe Rutherford Duke, PA 04/12/2019, 4:50 PM  I have examined the patient and reviewed assessment and plan and discussed with patient.  Agree with above as stated.    Please see my note from earlier today.  Patient with AFib and MI.  Now with systolic heart failure, but euvolemic. AFib is rate controlled.  Xarelto for stroke prevention.   Palliative care assessed the patient.  THey may consider a move to hospice care based on how she does at rehab.   Lance Muss

## 2019-04-12 NOTE — Progress Notes (Signed)
Report called to Allena Katz at [facility].

## 2019-04-13 ENCOUNTER — Telehealth: Payer: Self-pay | Admitting: *Deleted

## 2019-04-13 DIAGNOSIS — R296 Repeated falls: Secondary | ICD-10-CM | POA: Diagnosis not present

## 2019-04-13 DIAGNOSIS — I5032 Chronic diastolic (congestive) heart failure: Secondary | ICD-10-CM | POA: Diagnosis not present

## 2019-04-13 DIAGNOSIS — I213 ST elevation (STEMI) myocardial infarction of unspecified site: Secondary | ICD-10-CM | POA: Diagnosis not present

## 2019-04-13 DIAGNOSIS — R627 Adult failure to thrive: Secondary | ICD-10-CM | POA: Diagnosis not present

## 2019-04-13 NOTE — Telephone Encounter (Signed)
Pt was on TCM report admitted 04/08/19 for evaluation ofchest pain. Chest x-ray showed acute edema or inflammation cannot be excluded.EKG showed Afib with ST elevations in V3 and V4. Cardiology was consulted for further management NSTEMI. Pt D/C 04/12/19 pt will follow=up w/cardiology   Abelino Derrick, PA-C Follow up on 04/19/2019

## 2019-04-15 DIAGNOSIS — I219 Acute myocardial infarction, unspecified: Secondary | ICD-10-CM | POA: Diagnosis not present

## 2019-04-15 DIAGNOSIS — I509 Heart failure, unspecified: Secondary | ICD-10-CM | POA: Diagnosis not present

## 2019-04-15 DIAGNOSIS — I10 Essential (primary) hypertension: Secondary | ICD-10-CM | POA: Diagnosis not present

## 2019-04-15 DIAGNOSIS — I4891 Unspecified atrial fibrillation: Secondary | ICD-10-CM | POA: Diagnosis not present

## 2019-04-17 DIAGNOSIS — I5021 Acute systolic (congestive) heart failure: Secondary | ICD-10-CM | POA: Diagnosis not present

## 2019-04-17 DIAGNOSIS — I10 Essential (primary) hypertension: Secondary | ICD-10-CM | POA: Diagnosis not present

## 2019-04-17 DIAGNOSIS — R296 Repeated falls: Secondary | ICD-10-CM | POA: Diagnosis not present

## 2019-04-17 DIAGNOSIS — I213 ST elevation (STEMI) myocardial infarction of unspecified site: Secondary | ICD-10-CM | POA: Diagnosis not present

## 2019-04-17 DIAGNOSIS — R627 Adult failure to thrive: Secondary | ICD-10-CM | POA: Diagnosis not present

## 2019-04-17 DIAGNOSIS — I5032 Chronic diastolic (congestive) heart failure: Secondary | ICD-10-CM | POA: Diagnosis not present

## 2019-04-17 DIAGNOSIS — I4819 Other persistent atrial fibrillation: Secondary | ICD-10-CM | POA: Diagnosis not present

## 2019-04-18 ENCOUNTER — Non-Acute Institutional Stay: Payer: PPO | Admitting: Internal Medicine

## 2019-04-18 ENCOUNTER — Other Ambulatory Visit: Payer: Self-pay

## 2019-04-18 ENCOUNTER — Encounter: Payer: Self-pay | Admitting: Internal Medicine

## 2019-04-18 VITALS — BP 100/84 | HR 76 | Wt 119.0 lb

## 2019-04-18 DIAGNOSIS — Z515 Encounter for palliative care: Secondary | ICD-10-CM

## 2019-04-18 DIAGNOSIS — Z7189 Other specified counseling: Secondary | ICD-10-CM

## 2019-04-18 NOTE — Progress Notes (Signed)
Feb 22nd, 2021 [facility] Consult Note Telephone: [phone]  Fax: [phone]  PATIENT NAME: Ann Booth DOB: 09-17-1926 MRN: [id number] f [place], [place]  PRIMARY CARE PROVIDER:   Biagio Borg, MD  REFERRING PROVIDER:  Biagio Borg, MD [place],  [place] [address]  Dr. Heron Sabins (cardiology) Followed by [place]:    *(nephew) Eual Fines [phone]. (niece/nurse) Henri Gales [phone]. (sister) [phone].  ASSESSMENT / RECOMMENDATIONS:  1. Advance Care Planning: A. Directives: MOST on facility chart/will upload to [facility] EMR:  B. Goals of Care: Nephew Ray reports, per patient's hospitalist, patient with poor prognosis (months). Wish to bring patient home.  2. Cognitive / Functional status: Alert. Needs 1 person assist to stand and guiding assist to ambulate. Needs verbal and hands on cueing to ambulate. Prior to hospital admission patient was reported to getting weaker at home. She was independent in her ADLs, but having more frequent periods of confusion. (03/22/2019) Weight 119lbs. At a height of 5'2" her BMI is 21.8 kg/m2.  3. Family Supports: I spoke to nephew Jeanell Sparrow, who shares with me that patient was living independently prior to her hospital admission. Patient has been widowed for about 7 yrs.  No children. 17 mom (patient's sister) and a neighbor stop by regularly to see patient. Patient states she has 4 siblings (in their 42's); 3 sisters one of whom is deceased, and 1 brother. Ray reports that the hospitalist shared that patient has a terminal diagnosis r/t her heart disease. That her prognosis is months. Their desire is to have patient return to home with supports; believes that they could provide 24/7 supervision. Ray mentions that anticipated rehab facility discharge is March 9th.   4. Follow up Palliative Care Visit: Thu 04/28/2019 @ 9am   I spent 60 minutes  providing this consultation from 1pm to 2pm. More than 50% of the time in this consultation was spent coordinating communication.   HISTORY OF PRESENT ILLNESS:  Ann Booth is a 86 y.o. female with h/o persistent atrial fibrillation (Xarelto, beta blocker), moderate MR, moderate to severe TR, CHF, HTN, HLD, and anxiety.  2/12-2/16/2021: Hospitalized with NSTEMI. Echo 30-35%. Treated medically d/t late presentation, clinical status, Xarelto) Palliative Care was asked to help address goals of care.   CODE STATUS:   PPS: weak 40%  HOSPICE ELIGIBILITY/DIAGNOSIS: TBD  PAST MEDICAL HISTORY:  Past Medical History:  Diagnosis Date  . ALLERGIC RHINITIS   . ANXIETY   . ASTHMA   . Atrial fibrillation ([place])   . BACK PAIN   . Carotid stenosis 04/27/2012   Mild left on community screening  - Nov 2013  . CATARACT NOS   . DEPRESSION   . GLAUCOMA NOS   . HTN (hypertension)   . Hypercalcemia   . MITRAL VALVE PROLAPSE   . OSTEOPOROSIS   . PERIPHERAL NEUROPATHY   . POSITIONAL VERTIGO   . Rosacea     SOCIAL HX:  Social History   Tobacco Use  . Smoking status: Never Smoker  . Smokeless tobacco: Never Used  Substance Use Topics  . Alcohol use: No    ALLERGIES:  Allergies  Allergen Reactions  . Cymbalta [Duloxetine Hcl] Other (See Comments)     PERTINENT MEDICATIONS:  Outpatient Encounter Medications as of 04/18/2019  Medication Sig  . ALPRAZolam (XANAX) 0.5 MG tablet 1/2 - 1 tab by mouth at bedtime as needed (Patient not taking: Reported  on 04/08/2019)  . aspirin EC 81 MG EC tablet Take 1 tablet (81 mg total) by mouth daily.  Marland Kitchen atorvastatin (LIPITOR) 40 MG tablet Take 1 tablet (40 mg total) by mouth daily at 6 PM.  . citalopram (CELEXA) 10 MG tablet Take 1 tablet (10 mg total) by mouth daily.  Marland Kitchen donepezil (ARICEPT) 5 MG tablet Take 1 tablet (5 mg total) by mouth at bedtime.  . furosemide (LASIX) 20 MG tablet TAKE 1 TABLET BY MOUTH ONCE DAILY AS NEEDED FOR SWELLING (Patient taking  differently: Take 20 mg by mouth daily as needed for fluid. )  . isosorbide dinitrate (ISORDIL) 10 MG tablet Take 1 tablet (10 mg total) by mouth 2 (two) times daily.  Marland Kitchen lidocaine (LIDODERM) 5 % Place 1 patch onto the skin daily. Remove & Discard patch within 12 hours or as directed by MD  . metoprolol tartrate (LOPRESSOR) 50 MG tablet Take 1 tablet (50 mg total) by mouth 2 (two) times daily.  . nitroGLYCERIN (NITROSTAT) 0.4 MG SL tablet Place 1 tablet (0.4 mg total) under the tongue every 5 (five) minutes x 3 doses as needed for chest pain.  . Rivaroxaban (XARELTO) 15 MG TABS tablet Take 1 tablet (15 mg total) by mouth daily with supper.  . timolol (BETIMOL) 0.5 % ophthalmic solution Place 1 drop into both eyes 2 (two) times daily.   . traMADol (ULTRAM) 50 MG tablet Take 1 tablet (50 mg total) by mouth every 6 (six) hours as needed. (Patient not taking: Reported on 07/09/2018)   No facility-administered encounter medications on file as of 04/18/2019.    PHYSICAL EXAM:   General: NAD, frail appearing, thin. Couldn't recollect reason for recent hospitalization. She is lying semi recumbent in bed. Needed 1 person assist to stand. Cardiovascular: irregular rate and rhythm without murmur, rub, gallop Pulmonary: clear ant/post fields Abdomen: soft, nontender, + bowel sounds GU: no suprapubic tenderness Extremities: no edema, no joint deformities Skin: no rashes Neurological: Weakness but otherwise non-focal  Anselm Lis, NP

## 2019-04-19 ENCOUNTER — Telehealth: Payer: PPO | Admitting: Cardiology

## 2019-04-19 DIAGNOSIS — I255 Ischemic cardiomyopathy: Secondary | ICD-10-CM | POA: Insufficient documentation

## 2019-04-20 ENCOUNTER — Encounter: Payer: Self-pay | Admitting: Cardiology

## 2019-04-20 ENCOUNTER — Telehealth: Payer: PPO | Admitting: Cardiology

## 2019-04-20 ENCOUNTER — Telehealth (INDEPENDENT_AMBULATORY_CARE_PROVIDER_SITE_OTHER): Payer: PPO | Admitting: Cardiology

## 2019-04-20 DIAGNOSIS — I5032 Chronic diastolic (congestive) heart failure: Secondary | ICD-10-CM | POA: Diagnosis not present

## 2019-04-20 DIAGNOSIS — Z5329 Procedure and treatment not carried out because of patient's decision for other reasons: Secondary | ICD-10-CM

## 2019-04-20 DIAGNOSIS — I213 ST elevation (STEMI) myocardial infarction of unspecified site: Secondary | ICD-10-CM | POA: Diagnosis not present

## 2019-04-20 DIAGNOSIS — R05 Cough: Secondary | ICD-10-CM | POA: Diagnosis not present

## 2019-04-20 DIAGNOSIS — I4819 Other persistent atrial fibrillation: Secondary | ICD-10-CM | POA: Diagnosis not present

## 2019-04-21 DIAGNOSIS — I213 ST elevation (STEMI) myocardial infarction of unspecified site: Secondary | ICD-10-CM | POA: Diagnosis not present

## 2019-04-21 DIAGNOSIS — I4819 Other persistent atrial fibrillation: Secondary | ICD-10-CM | POA: Diagnosis not present

## 2019-04-21 DIAGNOSIS — R05 Cough: Secondary | ICD-10-CM | POA: Diagnosis not present

## 2019-04-21 DIAGNOSIS — I5032 Chronic diastolic (congestive) heart failure: Secondary | ICD-10-CM | POA: Diagnosis not present

## 2019-04-21 NOTE — Progress Notes (Signed)
Pt was hospitalized.  Alonna Bartling PA-C 04/21/2019 8:00 AM

## 2019-04-27 ENCOUNTER — Encounter: Payer: PPO | Admitting: Internal Medicine

## 2019-04-28 ENCOUNTER — Encounter: Payer: PPO | Admitting: Internal Medicine

## 2019-04-28 ENCOUNTER — Other Ambulatory Visit: Payer: Self-pay

## 2019-05-03 ENCOUNTER — Encounter: Payer: Self-pay | Admitting: Internal Medicine

## 2019-05-03 ENCOUNTER — Other Ambulatory Visit: Payer: Self-pay

## 2019-05-03 ENCOUNTER — Non-Acute Institutional Stay: Payer: PPO | Admitting: Internal Medicine

## 2019-05-03 DIAGNOSIS — Z7189 Other specified counseling: Secondary | ICD-10-CM | POA: Diagnosis not present

## 2019-05-03 DIAGNOSIS — Z515 Encounter for palliative care: Secondary | ICD-10-CM

## 2019-05-03 NOTE — Progress Notes (Signed)
March 9th, 2021 [place] Consult Note Telephone: [phone]  Fax: [phone]   PATIENT NAME: Ann Booth DOB: 01-03-1927 MRN: [id number] f [place], [place]   PRIMARY CARE PROVIDER:   Biagio Borg, MD   REFERRING PROVIDER:  Biagio Borg, MD [place],  [place] [id number]   Dr. Heron Sabins (cardiology) Followed by [place]:    *(nephew) Eual Fines [phone]. (niece/nurse) Henri Gales [phone]. (sister) [phone].   ASSESSMENT / RECOMMENDATIONS:  1. Advance Care Planning: A. Directives: MOST on facility chart/will upload to [facility] EMR: DNR/DNI. Yes to Antibiotics and IVFs. Tube feeding option left blank. B. Goals of Care/Family Supports: Patient mentions she hopes to be going home. I spoke to patient's neice Henri; she reports that the family anticipate a meeting with the facility SW tomorrow in the hope of extending patient's rehab for a few more days. Thereafter, goal is for patient to return home, with family and hired caregiver support. Henri mentions they will have to see how things work out; may need to consider assisted living in the future. Patient's siblings are age 22 and 49. They would like patient to be home but are limited in how much assistance they can provide. Nephew Ray is currently caring for his elderly parents.   2. Cognitive / Functional status: Alert. A & O x 3. Conversant and answers questions appropriately, but staff report this waxes and wanes. Progressing with PT. Needs 1 person assist to stand (independent laying to sit) watchful eye when ambulating. She fell yesterday; patient able to report she was reaching for her rolling bedside table and it rolled out from under her. Sustained a bruise size 50 cent piece upper outer aspect of left eyebrow. Abrasion on her extremity Prior to hospital admission patient was reported was independent in her ADLs, but having  more frequent periods of confusion. (03/22/2019) Weight 119lbs. Yesterday (05/02/2019) weight was 124.8lbs.  At a height of 5'2" her BMI is 22.8 kg/m2. Staff report consuming 100% of meals; independent in feeding. Continues Oxygen at @ 2 liters [place]. Staff report dyspnea off O2. Continent of bowel and bladder.    3. Follow up Palliative Care Visit: pnd discharge to home. PC can continue f/u at the home.    I spent 60 minutes providing this consultation from 1pm to 2pm. More than 50% of the time in this consultation was spent coordinating communication.    HISTORY OF PRESENT ILLNESS:  Ann Booth is a 86 y.o. female with h/o persistent atrial fibrillation (Xarelto, beta blocker), moderate MR, moderate to severe TR, CHF, HTN, HLD, and anxiety.   2/12-2/16/2021: Hospitalized with NSTEMI. Echo 30-35%. Treated medically d/t late presentation, clinical status, Xarelto)  This is a f/u Palliative Care visit form 04/18/2019.    CODE STATUS: DNR   PPS: weak 40%   HOSPICE ELIGIBILITY/DIAGNOSIS: TBD PAST MEDICAL HISTORY:  Past Medical History:  Diagnosis Date  . ALLERGIC RHINITIS   . ANXIETY   . ASTHMA   . Atrial fibrillation ([place])   . BACK PAIN   . Carotid stenosis 04/27/2012   Mild left on community screening  - Nov 2013  . CATARACT NOS   . DEPRESSION   . GLAUCOMA NOS   . HTN (hypertension)   . Hypercalcemia   . MITRAL VALVE PROLAPSE   . OSTEOPOROSIS   . PERIPHERAL NEUROPATHY   . POSITIONAL VERTIGO   . Rosacea  SOCIAL HX:  Social History   Tobacco Use  . Smoking status: Never Smoker  . Smokeless tobacco: Never Used  Substance Use Topics  . Alcohol use: No    ALLERGIES:  Allergies  Allergen Reactions  . Cymbalta [Duloxetine Hcl] Other (See Comments)     PERTINENT MEDICATIONS:  Outpatient Encounter Medications as of 05/03/2019  Medication Sig  . ALPRAZolam (XANAX) 0.5 MG tablet 1/2 - 1 tab by mouth at bedtime as needed (Patient not taking: Reported on 04/08/2019)  . aspirin  EC 81 MG EC tablet Take 1 tablet (81 mg total) by mouth daily.  Marland Kitchen atorvastatin (LIPITOR) 40 MG tablet Take 1 tablet (40 mg total) by mouth daily at 6 PM.  . citalopram (CELEXA) 10 MG tablet Take 1 tablet (10 mg total) by mouth daily.  Marland Kitchen donepezil (ARICEPT) 5 MG tablet Take 1 tablet (5 mg total) by mouth at bedtime.  . furosemide (LASIX) 20 MG tablet TAKE 1 TABLET BY MOUTH ONCE DAILY AS NEEDED FOR SWELLING (Patient taking differently: Take 20 mg by mouth daily as needed for fluid. )  . isosorbide dinitrate (ISORDIL) 10 MG tablet Take 1 tablet (10 mg total) by mouth 2 (two) times daily.  Marland Kitchen lidocaine (LIDODERM) 5 % Place 1 patch onto the skin daily. Remove & Discard patch within 12 hours or as directed by MD  . metoprolol tartrate (LOPRESSOR) 50 MG tablet Take 1 tablet (50 mg total) by mouth 2 (two) times daily.  . nitroGLYCERIN (NITROSTAT) 0.4 MG SL tablet Place 1 tablet (0.4 mg total) under the tongue every 5 (five) minutes x 3 doses as needed for chest pain.  . Rivaroxaban (XARELTO) 15 MG TABS tablet Take 1 tablet (15 mg total) by mouth daily with supper.  . timolol (BETIMOL) 0.5 % ophthalmic solution Place 1 drop into both eyes 2 (two) times daily.   . traMADol (ULTRAM) 50 MG tablet Take 1 tablet (50 mg total) by mouth every 6 (six) hours as needed.   No facility-administered encounter medications on file as of 05/03/2019.    PHYSICAL EXAM:   BP 110/84, HR 88, RR 16   General: NAD, sleepy, thin, lying recumbent in bed. Sleeping but awoke easily to voice. Able to tell me name of facility. Improved cognition form when I last saw her 2 weeks earlier.  Cardiovascular: irregular rate and rhythm without murmur, rub, gallop Pulmonary: clear ant/post fields Abdomen: soft, nontender, + bowel sounds GU: no suprapubic tenderness Extremities: mild bilateral LE swelling R>L, no joint deformities Skin: no rashes; scattered petechia LEs Neurological: Weakness but otherwise non-focal Anselm Lis,  NP

## 2019-05-04 DIAGNOSIS — M25552 Pain in left hip: Secondary | ICD-10-CM | POA: Diagnosis not present

## 2019-05-05 DIAGNOSIS — R0602 Shortness of breath: Secondary | ICD-10-CM | POA: Diagnosis not present

## 2019-05-06 DIAGNOSIS — J189 Pneumonia, unspecified organism: Secondary | ICD-10-CM | POA: Diagnosis not present

## 2019-05-06 DIAGNOSIS — I509 Heart failure, unspecified: Secondary | ICD-10-CM | POA: Diagnosis not present

## 2019-05-06 DIAGNOSIS — F0390 Unspecified dementia without behavioral disturbance: Secondary | ICD-10-CM | POA: Diagnosis not present

## 2019-05-06 DIAGNOSIS — I4891 Unspecified atrial fibrillation: Secondary | ICD-10-CM | POA: Diagnosis not present

## 2019-05-12 DIAGNOSIS — I5032 Chronic diastolic (congestive) heart failure: Secondary | ICD-10-CM | POA: Diagnosis not present

## 2019-05-12 DIAGNOSIS — I340 Nonrheumatic mitral (valve) insufficiency: Secondary | ICD-10-CM | POA: Diagnosis not present

## 2019-05-12 DIAGNOSIS — I213 ST elevation (STEMI) myocardial infarction of unspecified site: Secondary | ICD-10-CM | POA: Diagnosis not present

## 2019-05-16 DIAGNOSIS — I5021 Acute systolic (congestive) heart failure: Secondary | ICD-10-CM | POA: Diagnosis not present

## 2019-05-16 DIAGNOSIS — R2681 Unsteadiness on feet: Secondary | ICD-10-CM | POA: Diagnosis not present

## 2019-05-16 DIAGNOSIS — I4891 Unspecified atrial fibrillation: Secondary | ICD-10-CM | POA: Diagnosis not present

## 2019-05-16 DIAGNOSIS — F411 Generalized anxiety disorder: Secondary | ICD-10-CM | POA: Diagnosis not present

## 2019-05-16 DIAGNOSIS — I10 Essential (primary) hypertension: Secondary | ICD-10-CM | POA: Diagnosis not present

## 2019-05-16 DIAGNOSIS — R41841 Cognitive communication deficit: Secondary | ICD-10-CM | POA: Diagnosis not present

## 2019-05-16 DIAGNOSIS — E785 Hyperlipidemia, unspecified: Secondary | ICD-10-CM | POA: Diagnosis not present

## 2019-05-16 DIAGNOSIS — R2689 Other abnormalities of gait and mobility: Secondary | ICD-10-CM | POA: Diagnosis not present

## 2019-05-16 DIAGNOSIS — R627 Adult failure to thrive: Secondary | ICD-10-CM | POA: Diagnosis not present

## 2019-05-16 DIAGNOSIS — M6281 Muscle weakness (generalized): Secondary | ICD-10-CM | POA: Diagnosis not present

## 2019-05-26 DIAGNOSIS — R2689 Other abnormalities of gait and mobility: Secondary | ICD-10-CM | POA: Diagnosis not present

## 2019-05-26 DIAGNOSIS — F411 Generalized anxiety disorder: Secondary | ICD-10-CM | POA: Diagnosis not present

## 2019-05-26 DIAGNOSIS — E785 Hyperlipidemia, unspecified: Secondary | ICD-10-CM | POA: Diagnosis not present

## 2019-05-26 DIAGNOSIS — I10 Essential (primary) hypertension: Secondary | ICD-10-CM | POA: Diagnosis not present

## 2019-05-26 DIAGNOSIS — M6281 Muscle weakness (generalized): Secondary | ICD-10-CM | POA: Diagnosis not present

## 2019-05-26 DIAGNOSIS — R2681 Unsteadiness on feet: Secondary | ICD-10-CM | POA: Diagnosis not present

## 2019-05-26 DIAGNOSIS — R41841 Cognitive communication deficit: Secondary | ICD-10-CM | POA: Diagnosis not present

## 2019-05-26 DIAGNOSIS — R278 Other lack of coordination: Secondary | ICD-10-CM | POA: Diagnosis not present

## 2019-05-26 DIAGNOSIS — I4891 Unspecified atrial fibrillation: Secondary | ICD-10-CM | POA: Diagnosis not present

## 2019-05-26 DIAGNOSIS — S42002A Fracture of unspecified part of left clavicle, initial encounter for closed fracture: Secondary | ICD-10-CM | POA: Diagnosis not present

## 2019-05-26 DIAGNOSIS — R627 Adult failure to thrive: Secondary | ICD-10-CM | POA: Diagnosis not present

## 2019-05-26 DIAGNOSIS — I5021 Acute systolic (congestive) heart failure: Secondary | ICD-10-CM | POA: Diagnosis not present

## 2019-06-01 DIAGNOSIS — R21 Rash and other nonspecific skin eruption: Secondary | ICD-10-CM | POA: Diagnosis not present

## 2019-06-01 DIAGNOSIS — R600 Localized edema: Secondary | ICD-10-CM | POA: Diagnosis not present

## 2019-06-10 DIAGNOSIS — F411 Generalized anxiety disorder: Secondary | ICD-10-CM | POA: Diagnosis not present

## 2019-06-10 DIAGNOSIS — I4891 Unspecified atrial fibrillation: Secondary | ICD-10-CM | POA: Diagnosis not present

## 2019-06-10 DIAGNOSIS — Z20828 Contact with and (suspected) exposure to other viral communicable diseases: Secondary | ICD-10-CM | POA: Diagnosis not present

## 2019-06-10 DIAGNOSIS — I509 Heart failure, unspecified: Secondary | ICD-10-CM | POA: Diagnosis not present

## 2019-06-10 DIAGNOSIS — F0390 Unspecified dementia without behavioral disturbance: Secondary | ICD-10-CM | POA: Diagnosis not present

## 2019-06-15 DIAGNOSIS — Z20828 Contact with and (suspected) exposure to other viral communicable diseases: Secondary | ICD-10-CM | POA: Diagnosis not present

## 2019-06-15 DIAGNOSIS — R0602 Shortness of breath: Secondary | ICD-10-CM | POA: Diagnosis not present

## 2019-06-16 DIAGNOSIS — F0390 Unspecified dementia without behavioral disturbance: Secondary | ICD-10-CM | POA: Diagnosis not present

## 2019-06-16 DIAGNOSIS — F329 Major depressive disorder, single episode, unspecified: Secondary | ICD-10-CM | POA: Diagnosis not present

## 2019-06-16 DIAGNOSIS — R627 Adult failure to thrive: Secondary | ICD-10-CM | POA: Diagnosis not present

## 2019-06-16 DIAGNOSIS — I5032 Chronic diastolic (congestive) heart failure: Secondary | ICD-10-CM | POA: Diagnosis not present

## 2019-06-16 DIAGNOSIS — I340 Nonrheumatic mitral (valve) insufficiency: Secondary | ICD-10-CM | POA: Diagnosis not present

## 2019-06-16 DIAGNOSIS — R0600 Dyspnea, unspecified: Secondary | ICD-10-CM | POA: Diagnosis not present

## 2019-06-16 DIAGNOSIS — I110 Hypertensive heart disease with heart failure: Secondary | ICD-10-CM | POA: Diagnosis not present

## 2019-06-16 DIAGNOSIS — I4819 Other persistent atrial fibrillation: Secondary | ICD-10-CM | POA: Diagnosis not present

## 2019-06-16 DIAGNOSIS — F419 Anxiety disorder, unspecified: Secondary | ICD-10-CM | POA: Diagnosis not present

## 2019-06-16 DIAGNOSIS — R05 Cough: Secondary | ICD-10-CM | POA: Diagnosis not present

## 2019-06-19 DIAGNOSIS — R0600 Dyspnea, unspecified: Secondary | ICD-10-CM | POA: Diagnosis not present

## 2019-06-19 DIAGNOSIS — F419 Anxiety disorder, unspecified: Secondary | ICD-10-CM | POA: Diagnosis not present

## 2019-06-19 DIAGNOSIS — I4819 Other persistent atrial fibrillation: Secondary | ICD-10-CM | POA: Diagnosis not present

## 2019-06-19 DIAGNOSIS — F0390 Unspecified dementia without behavioral disturbance: Secondary | ICD-10-CM | POA: Diagnosis not present

## 2019-06-19 DIAGNOSIS — J189 Pneumonia, unspecified organism: Secondary | ICD-10-CM | POA: Diagnosis not present

## 2019-06-19 DIAGNOSIS — I5032 Chronic diastolic (congestive) heart failure: Secondary | ICD-10-CM | POA: Diagnosis not present

## 2019-06-19 DIAGNOSIS — F329 Major depressive disorder, single episode, unspecified: Secondary | ICD-10-CM | POA: Diagnosis not present

## 2019-06-19 DIAGNOSIS — R05 Cough: Secondary | ICD-10-CM | POA: Diagnosis not present

## 2019-06-19 DIAGNOSIS — R627 Adult failure to thrive: Secondary | ICD-10-CM | POA: Diagnosis not present

## 2019-06-20 DIAGNOSIS — F0390 Unspecified dementia without behavioral disturbance: Secondary | ICD-10-CM | POA: Diagnosis not present

## 2019-06-20 DIAGNOSIS — W050XXA Fall from non-moving wheelchair, initial encounter: Secondary | ICD-10-CM | POA: Diagnosis not present

## 2019-06-20 DIAGNOSIS — F419 Anxiety disorder, unspecified: Secondary | ICD-10-CM | POA: Diagnosis not present

## 2019-06-20 DIAGNOSIS — R269 Unspecified abnormalities of gait and mobility: Secondary | ICD-10-CM | POA: Diagnosis not present

## 2019-06-21 DIAGNOSIS — Z20828 Contact with and (suspected) exposure to other viral communicable diseases: Secondary | ICD-10-CM | POA: Diagnosis not present

## 2019-06-23 DIAGNOSIS — I5032 Chronic diastolic (congestive) heart failure: Secondary | ICD-10-CM | POA: Diagnosis not present

## 2019-06-23 DIAGNOSIS — R05 Cough: Secondary | ICD-10-CM | POA: Diagnosis not present

## 2019-06-23 DIAGNOSIS — R627 Adult failure to thrive: Secondary | ICD-10-CM | POA: Diagnosis not present

## 2019-06-23 DIAGNOSIS — J189 Pneumonia, unspecified organism: Secondary | ICD-10-CM | POA: Diagnosis not present

## 2019-06-23 DIAGNOSIS — R0600 Dyspnea, unspecified: Secondary | ICD-10-CM | POA: Diagnosis not present

## 2019-06-23 DIAGNOSIS — F419 Anxiety disorder, unspecified: Secondary | ICD-10-CM | POA: Diagnosis not present

## 2019-06-23 DIAGNOSIS — F0390 Unspecified dementia without behavioral disturbance: Secondary | ICD-10-CM | POA: Diagnosis not present

## 2019-06-23 DIAGNOSIS — I4819 Other persistent atrial fibrillation: Secondary | ICD-10-CM | POA: Diagnosis not present

## 2019-06-23 DIAGNOSIS — F329 Major depressive disorder, single episode, unspecified: Secondary | ICD-10-CM | POA: Diagnosis not present

## 2019-06-26 DIAGNOSIS — Z03818 Encounter for observation for suspected exposure to other biological agents ruled out: Secondary | ICD-10-CM | POA: Diagnosis not present

## 2019-06-30 DIAGNOSIS — E785 Hyperlipidemia, unspecified: Secondary | ICD-10-CM | POA: Diagnosis not present

## 2019-06-30 DIAGNOSIS — R278 Other lack of coordination: Secondary | ICD-10-CM | POA: Diagnosis not present

## 2019-06-30 DIAGNOSIS — R41841 Cognitive communication deficit: Secondary | ICD-10-CM | POA: Diagnosis not present

## 2019-06-30 DIAGNOSIS — R2681 Unsteadiness on feet: Secondary | ICD-10-CM | POA: Diagnosis not present

## 2019-06-30 DIAGNOSIS — R2689 Other abnormalities of gait and mobility: Secondary | ICD-10-CM | POA: Diagnosis not present

## 2019-06-30 DIAGNOSIS — F411 Generalized anxiety disorder: Secondary | ICD-10-CM | POA: Diagnosis not present

## 2019-06-30 DIAGNOSIS — M6281 Muscle weakness (generalized): Secondary | ICD-10-CM | POA: Diagnosis not present

## 2019-06-30 DIAGNOSIS — R627 Adult failure to thrive: Secondary | ICD-10-CM | POA: Diagnosis not present

## 2019-06-30 DIAGNOSIS — S42002A Fracture of unspecified part of left clavicle, initial encounter for closed fracture: Secondary | ICD-10-CM | POA: Diagnosis not present

## 2019-07-04 DIAGNOSIS — F0390 Unspecified dementia without behavioral disturbance: Secondary | ICD-10-CM | POA: Diagnosis not present

## 2019-07-04 DIAGNOSIS — E7849 Other hyperlipidemia: Secondary | ICD-10-CM | POA: Diagnosis not present

## 2019-07-04 DIAGNOSIS — I10 Essential (primary) hypertension: Secondary | ICD-10-CM | POA: Diagnosis not present

## 2019-07-04 DIAGNOSIS — F419 Anxiety disorder, unspecified: Secondary | ICD-10-CM | POA: Diagnosis not present

## 2019-07-04 DIAGNOSIS — F329 Major depressive disorder, single episode, unspecified: Secondary | ICD-10-CM | POA: Diagnosis not present

## 2019-07-05 DIAGNOSIS — Z20828 Contact with and (suspected) exposure to other viral communicable diseases: Secondary | ICD-10-CM | POA: Diagnosis not present

## 2019-07-08 DIAGNOSIS — F0390 Unspecified dementia without behavioral disturbance: Secondary | ICD-10-CM | POA: Diagnosis not present

## 2019-07-08 DIAGNOSIS — F411 Generalized anxiety disorder: Secondary | ICD-10-CM | POA: Diagnosis not present

## 2019-07-08 DIAGNOSIS — I4891 Unspecified atrial fibrillation: Secondary | ICD-10-CM | POA: Diagnosis not present

## 2019-07-08 DIAGNOSIS — I509 Heart failure, unspecified: Secondary | ICD-10-CM | POA: Diagnosis not present

## 2019-07-12 DIAGNOSIS — R6889 Other general symptoms and signs: Secondary | ICD-10-CM | POA: Diagnosis not present

## 2019-07-12 DIAGNOSIS — D649 Anemia, unspecified: Secondary | ICD-10-CM | POA: Diagnosis not present

## 2019-07-18 ENCOUNTER — Encounter (HOSPITAL_COMMUNITY): Payer: Self-pay

## 2019-07-18 ENCOUNTER — Emergency Department (HOSPITAL_COMMUNITY): Payer: PPO

## 2019-07-18 ENCOUNTER — Inpatient Hospital Stay (HOSPITAL_COMMUNITY)
Admission: EM | Admit: 2019-07-18 | Discharge: 2019-07-22 | DRG: 291 | Disposition: A | Discharge status: Skilled Nursing Facility | Payer: PPO | Attending: Internal Medicine | Admitting: Internal Medicine

## 2019-07-18 ENCOUNTER — Other Ambulatory Visit: Payer: Self-pay

## 2019-07-18 ENCOUNTER — Encounter: Payer: Self-pay | Admitting: Internal Medicine

## 2019-07-18 DIAGNOSIS — I5021 Acute systolic (congestive) heart failure: Secondary | ICD-10-CM | POA: Diagnosis present

## 2019-07-18 DIAGNOSIS — F419 Anxiety disorder, unspecified: Secondary | ICD-10-CM | POA: Diagnosis not present

## 2019-07-18 DIAGNOSIS — I4821 Permanent atrial fibrillation: Secondary | ICD-10-CM | POA: Diagnosis not present

## 2019-07-18 DIAGNOSIS — E876 Hypokalemia: Secondary | ICD-10-CM | POA: Diagnosis present

## 2019-07-18 DIAGNOSIS — F29 Unspecified psychosis not due to a substance or known physiological condition: Secondary | ICD-10-CM | POA: Diagnosis not present

## 2019-07-18 DIAGNOSIS — I272 Pulmonary hypertension, unspecified: Secondary | ICD-10-CM | POA: Diagnosis present

## 2019-07-18 DIAGNOSIS — F329 Major depressive disorder, single episode, unspecified: Secondary | ICD-10-CM | POA: Diagnosis present

## 2019-07-18 DIAGNOSIS — Z7901 Long term (current) use of anticoagulants: Secondary | ICD-10-CM

## 2019-07-18 DIAGNOSIS — I2510 Atherosclerotic heart disease of native coronary artery without angina pectoris: Secondary | ICD-10-CM | POA: Diagnosis not present

## 2019-07-18 DIAGNOSIS — Z9842 Cataract extraction status, left eye: Secondary | ICD-10-CM

## 2019-07-18 DIAGNOSIS — M25512 Pain in left shoulder: Secondary | ICD-10-CM | POA: Diagnosis not present

## 2019-07-18 DIAGNOSIS — J90 Pleural effusion, not elsewhere classified: Secondary | ICD-10-CM | POA: Diagnosis not present

## 2019-07-18 DIAGNOSIS — Z20822 Contact with and (suspected) exposure to covid-19: Secondary | ICD-10-CM | POA: Diagnosis present

## 2019-07-18 DIAGNOSIS — I509 Heart failure, unspecified: Secondary | ICD-10-CM

## 2019-07-18 DIAGNOSIS — Z7401 Bed confinement status: Secondary | ICD-10-CM | POA: Diagnosis not present

## 2019-07-18 DIAGNOSIS — H409 Unspecified glaucoma: Secondary | ICD-10-CM | POA: Diagnosis not present

## 2019-07-18 DIAGNOSIS — I252 Old myocardial infarction: Secondary | ICD-10-CM

## 2019-07-18 DIAGNOSIS — S42032A Displaced fracture of lateral end of left clavicle, initial encounter for closed fracture: Secondary | ICD-10-CM | POA: Diagnosis not present

## 2019-07-18 DIAGNOSIS — E785 Hyperlipidemia, unspecified: Secondary | ICD-10-CM | POA: Diagnosis present

## 2019-07-18 DIAGNOSIS — R52 Pain, unspecified: Secondary | ICD-10-CM | POA: Diagnosis not present

## 2019-07-18 DIAGNOSIS — I10 Essential (primary) hypertension: Secondary | ICD-10-CM | POA: Diagnosis not present

## 2019-07-18 DIAGNOSIS — E782 Mixed hyperlipidemia: Secondary | ICD-10-CM | POA: Diagnosis not present

## 2019-07-18 DIAGNOSIS — Z515 Encounter for palliative care: Secondary | ICD-10-CM | POA: Diagnosis not present

## 2019-07-18 DIAGNOSIS — R531 Weakness: Secondary | ICD-10-CM | POA: Diagnosis not present

## 2019-07-18 DIAGNOSIS — S42022A Displaced fracture of shaft of left clavicle, initial encounter for closed fracture: Secondary | ICD-10-CM | POA: Diagnosis not present

## 2019-07-18 DIAGNOSIS — J9601 Acute respiratory failure with hypoxia: Secondary | ICD-10-CM | POA: Diagnosis not present

## 2019-07-18 DIAGNOSIS — J309 Allergic rhinitis, unspecified: Secondary | ICD-10-CM | POA: Diagnosis present

## 2019-07-18 DIAGNOSIS — I5043 Acute on chronic combined systolic (congestive) and diastolic (congestive) heart failure: Secondary | ICD-10-CM | POA: Diagnosis present

## 2019-07-18 DIAGNOSIS — S79912A Unspecified injury of left hip, initial encounter: Secondary | ICD-10-CM | POA: Diagnosis not present

## 2019-07-18 DIAGNOSIS — I083 Combined rheumatic disorders of mitral, aortic and tricuspid valves: Secondary | ICD-10-CM | POA: Diagnosis present

## 2019-07-18 DIAGNOSIS — I4892 Unspecified atrial flutter: Secondary | ICD-10-CM | POA: Diagnosis not present

## 2019-07-18 DIAGNOSIS — Z79899 Other long term (current) drug therapy: Secondary | ICD-10-CM

## 2019-07-18 DIAGNOSIS — I4819 Other persistent atrial fibrillation: Secondary | ICD-10-CM | POA: Diagnosis not present

## 2019-07-18 DIAGNOSIS — M810 Age-related osteoporosis without current pathological fracture: Secondary | ICD-10-CM | POA: Diagnosis present

## 2019-07-18 DIAGNOSIS — R296 Repeated falls: Secondary | ICD-10-CM | POA: Diagnosis not present

## 2019-07-18 DIAGNOSIS — Z888 Allergy status to other drugs, medicaments and biological substances status: Secondary | ICD-10-CM

## 2019-07-18 DIAGNOSIS — I499 Cardiac arrhythmia, unspecified: Secondary | ICD-10-CM | POA: Diagnosis not present

## 2019-07-18 DIAGNOSIS — I959 Hypotension, unspecified: Secondary | ICD-10-CM | POA: Diagnosis present

## 2019-07-18 DIAGNOSIS — Z7189 Other specified counseling: Secondary | ICD-10-CM | POA: Diagnosis not present

## 2019-07-18 DIAGNOSIS — S299XXA Unspecified injury of thorax, initial encounter: Secondary | ICD-10-CM | POA: Diagnosis not present

## 2019-07-18 DIAGNOSIS — W19XXXA Unspecified fall, initial encounter: Secondary | ICD-10-CM

## 2019-07-18 DIAGNOSIS — R627 Adult failure to thrive: Secondary | ICD-10-CM | POA: Diagnosis not present

## 2019-07-18 DIAGNOSIS — F32A Depression, unspecified: Secondary | ICD-10-CM | POA: Diagnosis present

## 2019-07-18 DIAGNOSIS — I255 Ischemic cardiomyopathy: Secondary | ICD-10-CM | POA: Diagnosis present

## 2019-07-18 DIAGNOSIS — I5032 Chronic diastolic (congestive) heart failure: Secondary | ICD-10-CM | POA: Diagnosis not present

## 2019-07-18 DIAGNOSIS — S0990XA Unspecified injury of head, initial encounter: Secondary | ICD-10-CM | POA: Diagnosis not present

## 2019-07-18 DIAGNOSIS — Z9841 Cataract extraction status, right eye: Secondary | ICD-10-CM

## 2019-07-18 DIAGNOSIS — M79602 Pain in left arm: Secondary | ICD-10-CM

## 2019-07-18 DIAGNOSIS — F411 Generalized anxiety disorder: Secondary | ICD-10-CM | POA: Diagnosis not present

## 2019-07-18 DIAGNOSIS — M2550 Pain in unspecified joint: Secondary | ICD-10-CM | POA: Diagnosis not present

## 2019-07-18 DIAGNOSIS — Z66 Do not resuscitate: Secondary | ICD-10-CM | POA: Diagnosis present

## 2019-07-18 DIAGNOSIS — Y92009 Unspecified place in unspecified non-institutional (private) residence as the place of occurrence of the external cause: Secondary | ICD-10-CM | POA: Diagnosis not present

## 2019-07-18 DIAGNOSIS — I110 Hypertensive heart disease with heart failure: Principal | ICD-10-CM | POA: Diagnosis present

## 2019-07-18 DIAGNOSIS — Z961 Presence of intraocular lens: Secondary | ICD-10-CM | POA: Diagnosis present

## 2019-07-18 DIAGNOSIS — J810 Acute pulmonary edema: Secondary | ICD-10-CM | POA: Diagnosis not present

## 2019-07-18 DIAGNOSIS — W06XXXA Fall from bed, initial encounter: Secondary | ICD-10-CM | POA: Diagnosis present

## 2019-07-18 DIAGNOSIS — R197 Diarrhea, unspecified: Secondary | ICD-10-CM | POA: Diagnosis present

## 2019-07-18 DIAGNOSIS — R4189 Other symptoms and signs involving cognitive functions and awareness: Secondary | ICD-10-CM | POA: Diagnosis present

## 2019-07-18 DIAGNOSIS — S42002A Fracture of unspecified part of left clavicle, initial encounter for closed fracture: Secondary | ICD-10-CM | POA: Diagnosis present

## 2019-07-18 DIAGNOSIS — I4891 Unspecified atrial fibrillation: Secondary | ICD-10-CM | POA: Diagnosis present

## 2019-07-18 DIAGNOSIS — S199XXA Unspecified injury of neck, initial encounter: Secondary | ICD-10-CM | POA: Diagnosis not present

## 2019-07-18 DIAGNOSIS — Z7982 Long term (current) use of aspirin: Secondary | ICD-10-CM

## 2019-07-18 DIAGNOSIS — F0390 Unspecified dementia without behavioral disturbance: Secondary | ICD-10-CM | POA: Diagnosis present

## 2019-07-18 DIAGNOSIS — Z8249 Family history of ischemic heart disease and other diseases of the circulatory system: Secondary | ICD-10-CM

## 2019-07-18 LAB — COMPREHENSIVE METABOLIC PANEL
ALT: 62 U/L — ABNORMAL HIGH (ref 0–44)
AST: 65 U/L — ABNORMAL HIGH (ref 15–41)
Albumin: 2.3 g/dL — ABNORMAL LOW (ref 3.5–5.0)
Alkaline Phosphatase: 146 U/L — ABNORMAL HIGH (ref 38–126)
Anion gap: 12 (ref 5–15)
BUN: 19 mg/dL (ref 8–23)
CO2: 31 mmol/L (ref 22–32)
Calcium: 8.6 mg/dL — ABNORMAL LOW (ref 8.9–10.3)
Chloride: 100 mmol/L (ref 98–111)
Creatinine, Ser: 1.18 mg/dL — ABNORMAL HIGH (ref 0.44–1.00)
GFR calc Af Amer: 47 mL/min — ABNORMAL LOW (ref >60)
GFR calc non Af Amer: 40 mL/min — ABNORMAL LOW (ref >60)
Glucose, Bld: 87 mg/dL (ref 70–99)
Potassium: 2.9 mmol/L — ABNORMAL LOW (ref 3.5–5.1)
Sodium: 143 mmol/L (ref 135–145)
Total Bilirubin: 1.3 mg/dL — ABNORMAL HIGH (ref 0.3–1.2)
Total Protein: 5.4 g/dL — ABNORMAL LOW (ref 6.5–8.1)

## 2019-07-18 LAB — CBC WITH DIFFERENTIAL/PLATELET
Abs Immature Granulocytes: 0.02 10*3/uL (ref 0.00–0.07)
Basophils Absolute: 0 10*3/uL (ref 0.0–0.1)
Basophils Relative: 1 %
Eosinophils Absolute: 0.2 10*3/uL (ref 0.0–0.5)
Eosinophils Relative: 3 %
HCT: 44.6 % (ref 36.0–46.0)
Hemoglobin: 14.1 g/dL (ref 12.0–15.0)
Immature Granulocytes: 0 %
Lymphocytes Relative: 25 %
Lymphs Abs: 1.5 10*3/uL (ref 0.7–4.0)
MCH: 29.3 pg (ref 26.0–34.0)
MCHC: 31.6 g/dL (ref 30.0–36.0)
MCV: 92.7 fL (ref 80.0–100.0)
Monocytes Absolute: 0.7 10*3/uL (ref 0.1–1.0)
Monocytes Relative: 12 %
Neutro Abs: 3.7 10*3/uL (ref 1.7–7.7)
Neutrophils Relative %: 59 %
Platelets: 125 10*3/uL — ABNORMAL LOW (ref 150–400)
RBC: 4.81 MIL/uL (ref 3.87–5.11)
RDW: 17.4 % — ABNORMAL HIGH (ref 11.5–15.5)
WBC: 6.2 10*3/uL (ref 4.0–10.5)
nRBC: 0 % (ref 0.0–0.2)

## 2019-07-18 LAB — SARS CORONAVIRUS 2 BY RT PCR (HOSPITAL ORDER, PERFORMED IN ~~LOC~~ HOSPITAL LAB): SARS Coronavirus 2: NEGATIVE

## 2019-07-18 LAB — BRAIN NATRIURETIC PEPTIDE: B Natriuretic Peptide: 1061.2 pg/mL — ABNORMAL HIGH (ref 0.0–100.0)

## 2019-07-18 MED ORDER — SODIUM CHLORIDE 0.9% FLUSH
3.0000 mL | Freq: Two times a day (BID) | INTRAVENOUS | Status: DC
  Administered 2019-07-19 – 2019-07-22 (×4): 3 mL via INTRAVENOUS

## 2019-07-18 MED ORDER — POTASSIUM CHLORIDE 20 MEQ PO PACK
40.0000 meq | PACK | Freq: Two times a day (BID) | ORAL | Status: DC
  Filled 2019-07-18: qty 2

## 2019-07-18 MED ORDER — FUROSEMIDE 10 MG/ML IJ SOLN
40.0000 mg | Freq: Two times a day (BID) | INTRAMUSCULAR | Status: DC
  Administered 2019-07-19 – 2019-07-20 (×4): 40 mg via INTRAVENOUS
  Filled 2019-07-18 (×4): qty 4

## 2019-07-18 MED ORDER — CITALOPRAM HYDROBROMIDE 10 MG PO TABS
10.0000 mg | ORAL_TABLET | Freq: Every day | ORAL | Status: DC
  Administered 2019-07-19 – 2019-07-22 (×4): 10 mg via ORAL
  Filled 2019-07-18 (×4): qty 1

## 2019-07-18 MED ORDER — LIDOCAINE 5 % EX PTCH
1.0000 | MEDICATED_PATCH | CUTANEOUS | Status: DC
  Administered 2019-07-19 – 2019-07-22 (×4): 1 via TRANSDERMAL
  Filled 2019-07-18 (×4): qty 1

## 2019-07-18 MED ORDER — TIMOLOL MALEATE 0.5 % OP SOLN
1.0000 [drp] | Freq: Two times a day (BID) | OPHTHALMIC | Status: DC
  Administered 2019-07-19 – 2019-07-22 (×6): 1 [drp] via OPHTHALMIC
  Filled 2019-07-18: qty 5

## 2019-07-18 MED ORDER — POTASSIUM CHLORIDE 10 MEQ/100ML IV SOLN
10.0000 meq | Freq: Once | INTRAVENOUS | Status: AC
  Administered 2019-07-18: 10 meq via INTRAVENOUS
  Filled 2019-07-18: qty 100

## 2019-07-18 MED ORDER — SODIUM CHLORIDE 0.9 % IV BOLUS
250.0000 mL | Freq: Once | INTRAVENOUS | Status: AC
  Administered 2019-07-18: 250 mL via INTRAVENOUS

## 2019-07-18 MED ORDER — POTASSIUM CHLORIDE 20 MEQ PO PACK
40.0000 meq | PACK | ORAL | Status: AC
  Administered 2019-07-19 (×3): 40 meq via ORAL
  Filled 2019-07-18 (×3): qty 2

## 2019-07-18 MED ORDER — TRAMADOL HCL 50 MG PO TABS: 50.0000 mg | ORAL_TABLET | Freq: Four times a day (QID) | ORAL | Status: DC | PRN

## 2019-07-18 MED ORDER — ACETAMINOPHEN 325 MG PO TABS: 650.0000 mg | ORAL_TABLET | ORAL | Status: DC | PRN

## 2019-07-18 MED ORDER — ATORVASTATIN CALCIUM 40 MG PO TABS
40.0000 mg | ORAL_TABLET | Freq: Every day | ORAL | Status: DC
  Administered 2019-07-19 – 2019-07-21 (×3): 40 mg via ORAL
  Filled 2019-07-18 (×3): qty 1

## 2019-07-18 MED ORDER — ISOSORBIDE DINITRATE 10 MG PO TABS
10.0000 mg | ORAL_TABLET | Freq: Two times a day (BID) | ORAL | Status: DC
  Administered 2019-07-19 (×2): 10 mg via ORAL
  Filled 2019-07-18 (×2): qty 1

## 2019-07-18 MED ORDER — ASPIRIN 81 MG PO CHEW
81.0000 mg | CHEWABLE_TABLET | Freq: Every day | ORAL | Status: DC
  Administered 2019-07-19 – 2019-07-22 (×4): 81 mg via ORAL
  Filled 2019-07-18 (×4): qty 1

## 2019-07-18 MED ORDER — ONDANSETRON HCL 4 MG/2ML IJ SOLN: 4.0000 mg | Freq: Four times a day (QID) | INTRAMUSCULAR | Status: DC | PRN

## 2019-07-18 MED ORDER — IOHEXOL 300 MG/ML  SOLN
75.0000 mL | Freq: Once | INTRAMUSCULAR | Status: AC | PRN
  Administered 2019-07-18: 75 mL via INTRAVENOUS

## 2019-07-18 MED ORDER — SODIUM CHLORIDE 0.9 % IV SOLN: 250.0000 mL | INTRAVENOUS | Status: DC | PRN

## 2019-07-18 MED ORDER — SODIUM CHLORIDE 0.9% FLUSH: 3.0000 mL | INTRAVENOUS | Status: DC | PRN

## 2019-07-18 MED ORDER — RIVAROXABAN 15 MG PO TABS
15.0000 mg | ORAL_TABLET | Freq: Every day | ORAL | Status: DC
  Administered 2019-07-19 – 2019-07-21 (×4): 15 mg via ORAL
  Filled 2019-07-18 (×4): qty 1

## 2019-07-18 MED ORDER — DONEPEZIL HCL 10 MG PO TABS
10.0000 mg | ORAL_TABLET | Freq: Every day | ORAL | Status: DC
  Administered 2019-07-19 – 2019-07-21 (×4): 10 mg via ORAL
  Filled 2019-07-18 (×4): qty 1

## 2019-07-18 MED ORDER — METOPROLOL TARTRATE 50 MG PO TABS
50.0000 mg | ORAL_TABLET | Freq: Two times a day (BID) | ORAL | Status: DC
  Administered 2019-07-19 (×2): 50 mg via ORAL
  Filled 2019-07-18 (×2): qty 1

## 2019-07-18 NOTE — Progress Notes (Signed)
Orthopedic Tech Progress Note Patient Details:  Ann Booth [birth year] [id number]  Ortho Devices Type of Ortho Device: Sling immobilizer Ortho Device/Splint Location: LUE Ortho Device/Splint Interventions: Ordered, Application   Post Interventions Patient Tolerated: Well Instructions Provided: Care of device   Donald Pore 07/18/2019, 7:03 PM

## 2019-07-18 NOTE — ED Triage Notes (Addendum)
Pt bib ems, from [facility]. Pt fell yesterday. Pt was trying transfer into a chair and had a mechanical fall. Xray reports she has a broken clavicle. Pt hxt of dementia, pt is on a blood thinner xeralto. Pt aox4 and at baseline.   67pulse 16rr 97%o2 2L cbg 147 98.2*f 108 palpated pressure

## 2019-07-18 NOTE — Progress Notes (Signed)
Received from ER to room 11 via stretcher. Assisted to bed and positioned for comfort. Oriented to room, bed and unit. In no acute distress and denies pain.

## 2019-07-18 NOTE — ED Provider Notes (Signed)
Care of the patient was assumed from [facility] at 1550; see this physician's note for complete history of present illness, review of systems, and physical exam.  Briefly, the patient is a 86 y.o. female who presented to the ED with concerns after a fall from yesterday.  EKG at SNF showed broken clavicle.  History significant for HLD, HTN, A. fib (on Xarelto), CHF (LVEF 30 to 35% with moderately reduced RV systolic function as well).  Plan at time of handoff:  -Patient has low blood pressures, which appear to be chronic. -Follow-up on CT and x-ray imaging. -Likely discharge  MDM/ED Course: On my initial exam, the patient was resting in bed.  She appeared weak.  Labs are notable for potassium of 2.9.  10 mEq of IV potassium ordered, in addition to 40 mEq of p.o. potassium.  X-ray imaging showed minimally displaced distal left clavicle fracture.  Sling was placed for comfort.  Additional findings on x-ray were a moderate left pleural effusion and a minimal right pleural effusion.  These are new findings, compared to prior x-rays.  Given that the patient had a recent fall, CT scan of her chest was ordered to assess for possible hemothorax and/or any other occult injuries.  CT scan showed moderate to large bilateral pleural effusions.  Left-sided effusion appears to be partially loculated.  Right pleural effusion is simple in appearance.  Given additional findings of cardiomegaly, effusions are likely transudative, secondary to CHF.  BNP was ordered.  Patient remained stable with SPO2 of 100% on room air. Given her initial soft blood pressures and hypokalemia, diuresis was held.  Upon reassessment, she was more awake.  She was able to communicate.  This appears to be her baseline mentation, per nursing facility report.  Nursing facility also reported baseline mobility of transfers only.  They stated that she was only sent to the ED for management of her clavicle injury.  They report that she has otherwise  been well lately.  Patient was admitted to hospitalist service for CHF, pleural effusions and hypokalemia.  Patient seen in conjunction with the attending physician, Theda Belfast, MD, who participated in all aspects of the patient's care and was in agreement with the above plan.   Emergency Department Medication Summary: Medications  potassium chloride (KLOR-CON) packet 40 mEq (40 mEq Oral Given 07/19/19 0104)  aspirin chewable tablet 81 mg (has no administration in time range)  traMADol (ULTRAM) tablet 50 mg (has no administration in time range)  atorvastatin (LIPITOR) tablet 40 mg (has no administration in time range)  isosorbide dinitrate (ISORDIL) tablet 10 mg (10 mg Oral Given 07/19/19 0044)  metoprolol tartrate (LOPRESSOR) tablet 50 mg (50 mg Oral Given 07/19/19 0046)  citalopram (CELEXA) tablet 10 mg (has no administration in time range)  donepezil (ARICEPT) tablet 10 mg (10 mg Oral Given 07/19/19 0045)  Rivaroxaban (XARELTO) tablet 15 mg (15 mg Oral Given 07/19/19 0046)  timolol (TIMOPTIC) 0.5 % ophthalmic solution 1 drop (1 drop Both Eyes Given 07/19/19 0041)  lidocaine (LIDODERM) 5 % 1 patch (1 patch Transdermal Patch Applied 07/19/19 0046)  sodium chloride flush (NS) 0.9 % injection 3 mL (3 mLs Intravenous Given 07/19/19 0048)  sodium chloride flush (NS) 0.9 % injection 3 mL (has no administration in time range)  0.9 %  sodium chloride infusion (has no administration in time range)  acetaminophen (TYLENOL) tablet 650 mg (has no administration in time range)  ondansetron (ZOFRAN) injection 4 mg (has no administration in time range)  furosemide (LASIX) injection 40 mg (40 mg Intravenous Given 07/19/19 0048)  sodium chloride 0.9 % bolus 250 mL (250 mLs Intravenous New Bag/Given 07/18/19 1617)  potassium chloride 10 mEq in 100 mL IVPB (0 mEq Intravenous Stopped 07/18/19 2128)  iohexol (OMNIPAQUE) 300 MG/ML solution 75 mL (75 mLs Intravenous Contrast Given 07/18/19 1814)   Clinical  Impression: 1. Congestive heart failure, unspecified HF chronicity, unspecified heart failure type ([place])   2. Pleural effusion   3. Hypokalemia      Godfrey Pick, MD 07/19/19 0355    Tegeler, Gwenyth Allegra, MD 07/19/19 [phone]

## 2019-07-18 NOTE — ED Provider Notes (Signed)
[facility] EMERGENCY DEPARTMENT Provider Note   CSN: [id number] Arrival date & time: 07/18/19  1326     History Chief Complaint  Patient presents with   Ann Booth is a 86 y.o. female.  HPI      Level 5 caveat due to dementia.  Ann Booth is a 86 y.o. female, with a history of HTN, A. fib, dementia, presenting to the ED from a nursing home following a fall that occurred yesterday. Report from the nursing home indicates patient had a x-ray at the facility which showed a fractured left clavicle. Patient complains of pain to the left shoulder, upper chest, and hip. When I asked her why she fell out of bed, she replies, "I guess I just got too close to the edge." She is anticoagulated. Denies known head injury, neck/back pain, shortness of breath, nausea/vomiting, recent illness, abdominal pain, numbness, weakness, or any other complaints.  Past Medical History:  Diagnosis Date   ALLERGIC RHINITIS    ANXIETY    ASTHMA    Atrial fibrillation (HCC)    BACK PAIN    Carotid stenosis 04/27/2012   Mild left on community screening  - Nov 2013   CATARACT NOS    DEPRESSION    GLAUCOMA NOS    HTN (hypertension)    Hypercalcemia    MITRAL VALVE PROLAPSE    OSTEOPOROSIS    PERIPHERAL NEUROPATHY    POSITIONAL VERTIGO    Rosacea     Patient Active Problem List   Diagnosis Date Noted   Ischemic cardiomyopathy 04/19/2019   Goals of care, counseling/discussion    Palliative care by specialist    NSTEMI (non-ST elevated myocardial infarction) (HCC) 04/08/2019   Memory changes 03/22/2019   Delusional thoughts (HCC) 03/22/2019   Atrial fibrillation with RVR (HCC) 07/02/2018   Recurrent falls while walking 06/17/2018   Urinary incontinence 06/17/2018   Thoracic spine pain 04/05/2018   Hyperglycemia 04/05/2018   Falls frequently 03/27/2018   Other fatigue 03/10/2018   Degenerative arthritis of left knee  06/25/2017   Peripheral vascular disease (HCC) 05/21/2017   Rib pain 04/24/2017   Chest pain at rest 03/16/2017   Callus of foot 05/26/2016   Right foot pain 03/21/2016   Allergic conjunctivitis 03/21/2016   Insomnia 07/28/2013   Arthritis of sacroiliac joint 03/30/2013   Leg length discrepancy 03/30/2013   N&V (nausea and vomiting) 01/27/2013   Atrial fibrillation (HCC) 11/01/2012   Conjunctivitis 07/21/2012   Carotid stenosis 04/27/2012   Chronic lower back pain 04/27/2012   Idiopathic scoliosis 04/27/2012   Black stools 10/25/2011   HTN (hypertension) 08/23/2010   Preventative health care 07/25/2010   Peripheral edema 07/25/2010   Hypercalcemia 11/09/2008   POSITIONAL VERTIGO 11/09/2008   BACK PAIN 11/09/2008   Peripheral neuropathic pain 06/01/2007   Allergic rhinitis 06/01/2007   SHOULDER PAIN, BILATERAL 06/01/2007   FATIGUE 06/01/2007   Hyperlipidemia 10/18/2006   Anxiety state 10/18/2006   Depression 10/18/2006   Unspecified glaucoma 10/15/2006   CATARACT NOS 10/15/2006   Mitral valve disorder 10/15/2006   Asthma 10/15/2006   Rosacea 10/15/2006   Osteoporosis 10/15/2006    Past Surgical History:  Procedure Laterality Date    ankle surgury     left ankle   CATARACT EXTRACTION, BILATERAL     HEMORRHOID SURGERY     posterior chamber interocular lens implant       OB History   No obstetric history on file.  Family History  Problem Relation Age of Onset   CAD Mother        MI at age 56   Cancer Sister        colon   Cancer Brother        prostate cancer    Social History   Tobacco Use   Smoking status: Never Smoker   Smokeless tobacco: Never Used  Substance Use Topics   Alcohol use: No   Drug use: No    Home Medications Prior to Admission medications   Medication Sig Start Date End Date Taking? Authorizing Provider  aspirin 81 MG chewable tablet Chew 81 mg by mouth daily.   Yes [provider]  atorvastatin (LIPITOR) 40 MG tablet Take 1 tablet (40 mg total) by mouth daily at 6 PM. Patient taking differently: Take 40 mg by mouth daily at 8 pm.  04/12/19  Yes Duke, Roe Rutherford, PA  benzonatate (TESSALON) 100 MG capsule Take 100 mg by mouth 3 (three) times daily as needed for cough.   Yes [provider]  citalopram (CELEXA) 10 MG tablet Take 1 tablet (10 mg total) by mouth daily. 03/22/19 03/21/20 Yes Corwin Levins, MD  donepezil (ARICEPT) 10 MG tablet Take 10 mg by mouth at bedtime.   Yes [provider]  furosemide (LASIX) 20 MG tablet TAKE 1 TABLET BY MOUTH ONCE DAILY AS NEEDED FOR SWELLING Patient taking differently: Take 20 mg by mouth daily.  03/29/18  Yes Lewayne Bunting, MD  isosorbide dinitrate (ISORDIL) 10 MG tablet Take 1 tablet (10 mg total) by mouth 2 (two) times daily. 04/12/19  Yes Duke, Roe Rutherford, PA  lidocaine (LIDODERM) 5 % Place 1 patch onto the skin daily. Remove & Discard patch within 12 hours or as directed by MD Patient taking differently: Place 1 patch onto the skin daily. Apply 1 patch to affected area every morning (8AM) and remove in the evening (8PM) 10/05/17  Yes Corwin Levins, MD  metoprolol tartrate (LOPRESSOR) 50 MG tablet Take 1 tablet (50 mg total) by mouth 2 (two) times daily. 04/12/19  Yes Duke, Roe Rutherford, PA  nitroGLYCERIN (NITROSTAT) 0.4 MG SL tablet Place 1 tablet (0.4 mg total) under the tongue every 5 (five) minutes x 3 doses as needed for chest pain. 04/12/19  Yes Duke, Roe Rutherford, PA  Nutritional Supplements (NUTRITIONAL DRINK) LIQD Take 90 mLs by mouth daily. Med Pass   Yes [provider]  Rivaroxaban (XARELTO) 15 MG TABS tablet Take 1 tablet (15 mg total) by mouth daily with supper. Patient taking differently: Take 15 mg by mouth daily at 8 pm.  11/03/18  Yes Crenshaw, Madolyn Frieze, MD  timolol (TIMOPTIC) 0.5 % ophthalmic solution Place 1 drop into both eyes in the morning and at bedtime. 06/06/19  Yes  [provider]  traMADol (ULTRAM) 50 MG tablet Take 1 tablet (50 mg total) by mouth every 6 (six) hours as needed. Patient taking differently: Take 50 mg by mouth every 6 (six) hours as needed for moderate pain or severe pain.  06/17/18  Yes Corwin Levins, MD  ALPRAZolam Prudy Feeler) 0.5 MG tablet 1/2 - 1 tab by mouth at bedtime as needed Patient not taking: Reported on 04/08/2019 03/16/17   Corwin Levins, MD  aspirin EC 81 MG EC tablet Take 1 tablet (81 mg total) by mouth daily. Patient not taking: Reported on 07/18/2019 04/13/19   Marcelino Duster, PA  donepezil (ARICEPT) 5 MG tablet Take  1 tablet (5 mg total) by mouth at bedtime. Patient not taking: Reported on 07/18/2019 03/22/19   Corwin Levins, MD    Allergies    Cymbalta [duloxetine hcl]  Review of Systems   Review of Systems  Unable to perform ROS: Dementia    Physical Exam Updated Vital Signs BP 98/66    Temp 97.9 F (36.6 C) (Oral)    Resp (!) 22   Physical Exam Vitals and nursing note reviewed.  Constitutional:      General: She is not in acute distress.    Appearance: She is well-developed. She is not diaphoretic.  HENT:     Head: Normocephalic and atraumatic.     Comments: No noted wounds, swelling, tenderness, or deformity to the face or scalp.    Nose: Nose normal.     Mouth/Throat:     Mouth: Mucous membranes are moist.     Pharynx: Oropharynx is clear.  Eyes:     Conjunctiva/sclera: Conjunctivae normal.  Cardiovascular:     Rate and Rhythm: Normal rate and regular rhythm.     Pulses: Normal pulses.          Radial pulses are 2+ on the right side and 2+ on the left side.       Posterior tibial pulses are 2+ on the right side and 2+ on the left side.     Heart sounds: Normal heart sounds.     Comments: Tactile temperature in the extremities appropriate and equal bilaterally. Pulmonary:     Effort: Pulmonary effort is normal. No respiratory distress.     Breath sounds: Normal breath sounds.  Abdominal:       Palpations: Abdomen is soft.     Tenderness: There is no abdominal tenderness. There is no guarding.  Musculoskeletal:     Cervical back: Neck supple.     Right lower leg: No edema.     Left lower leg: No edema.     Comments: Tenderness over the left clavicle without noted deformity. Tenderness to left upper chest without deformity, instability, swelling, or wounds.  Tenderness to the left lateral hip. Question of external rotation. No noted swelling or color abnormality to the hip. Pelvis appears to be stable. No tenderness or deformity to the right hip. No pain with range of motion. The other major joints of the upper and lower extremities were palpated and ranged without pain, swelling, or discomfort with range of motion.  Lymphadenopathy:     Cervical: No cervical adenopathy.  Skin:    General: Skin is warm and dry.  Neurological:     Mental Status: She is alert.     Comments: Sensation light touch grossly intact throughout the extremities. Appropriate motor function intact in each of the extremities.  Psychiatric:        Mood and Affect: Mood and affect normal.        Speech: Speech normal.        Behavior: Behavior normal.     ED Results / Procedures / Treatments   Labs (all labs ordered are listed, but only abnormal results are displayed)   EKG None  Radiology DG Ribs Unilateral W/Chest Left  Result Date: 07/18/2019 CLINICAL DATA:  [place] Seat 1 transferring to a chair yesterday EXAM: LEFT RIBS AND CHEST - 3+ VIEW COMPARISON:  Chest radiograph 04/08/2019 FINDINGS: Enlargement of cardiac silhouette. Atherosclerotic calcification aorta. Small RIGHT pleural effusion and basilar atelectasis. Moderate LEFT pleural effusion with significant atelectasis of the lower LEFT lung. Peribronchial  thickening and scattered interstitial changes again identified. No pneumothorax. Diffuse osseous demineralization. No definite LEFT rib fractures identified. Minimally displaced fracture of the  distal LEFT clavicle is identified however. IMPRESSION: Moderate LEFT pleural effusion and significant atelectasis of the lower LEFT lung. Small RIGHT pleural effusion and mild RIGHT basilar atelectasis. Minimally displaced fracture distal LEFT clavicle. No definite rib fractures identified. Electronically Signed   By: Lavonia Dana M.D.   On: 07/18/2019 15:56   DG Clavicle Left  Result Date: 07/18/2019 CLINICAL DATA:  [place] when transferring to a chair yesterday, dementia EXAM: LEFT CLAVICLE - 2+ VIEWS COMPARISON:  Chest radiograph 04/08/2019 FINDINGS: Osseous demineralization. AC joint alignment normal. Clavicular head symmetric. Minimally displaced fracture of the distal LEFT clavicle. LEFT pleural effusion present. No additional fracture seen. IMPRESSION: Minimally displaced fracture of the distal LEFT clavicle. Electronically Signed   By: Lavonia Dana M.D.   On: 07/18/2019 15:58   DG Hip Unilat W or Wo Pelvis 2-3 Views Left  Result Date: 07/18/2019 CLINICAL DATA:  [place] when transferring to a chair yesterday, tenderness and pain EXAM: DG HIP (WITH OR WITHOUT PELVIS) 2-3V LEFT COMPARISON:  None FINDINGS: Diffuse osseous demineralization. Joint spaces preserved. No acute fracture, dislocation, or bone destruction. Degenerative changes and scoliosis at visualized lumbar spine. Pelvic phleboliths noted. IMPRESSION: No acute osseous abnormalities. Electronically Signed   By: Lavonia Dana M.D.   On: 07/18/2019 15:53    Procedures Procedures (including critical care time)  Medications Ordered in ED Medications  sodium chloride 0.9 % bolus 250 mL (250 mLs Intravenous New Bag/Given 07/18/19 1617)    ED Course  I have reviewed the triage vital signs and the nursing notes.  Pertinent labs & imaging results that were available during my care of the patient were reviewed by me and considered in my medical decision making (see chart for details).  Clinical Course as of Jul 17 1644  Mon Jul 18, 2019  1355  Declines analgesia at this time.    [SJ]    Clinical Course User Index [SJ] Shine Scrogham, Helane Gunther, PA-C   MDM Rules/Calculators/A&P                      Patient presents for evaluation following a fall reportedly from a bed. Patient is nontoxic appearing, afebrile, not tachycardic, not tachypneic, maintains excellent SPO2 on room air, and is in no apparent distress.   I have reviewed the patient's chart to obtain more information.  It was noted that she has had instances of blood pressure in the 95A systolic in the past. However, due to a couple blood pressures in the high 21H systolic, gentle hydration was given.  I personally reviewed and interpreted the patient's available imaging studies.  Findings and plan of care discussed with Theotis Burrow, MD. Dr. Rex Kras personally evaluated and examined this patient.  End of shift patient care handoff report given to Godfrey Pick, EM resident.  Plan: Review pending labs and imaging. Expected disposition back to SNF.    Vitals:   07/18/19 1445 07/18/19 1500 07/18/19 1615 07/18/19 1630  BP: 91/73 (!) 89/66 103/80 (!) 111/95  Pulse:  79    Resp: (!) 21 18 18 19   Temp:      TempSrc:      SpO2:  100%      Final Clinical Impression(s) / ED Diagnoses Final diagnoses:  None    Rx / DC Orders ED Discharge Orders    None  Anselm Pancoast, PA-C 07/18/19 1649    Little, Ambrose Finland, MD 07/22/19 [phone]

## 2019-07-18 NOTE — ED Notes (Signed)
IV in RIGHT AC. Unable to auscultate manual BP on lft arm.  Automatic registered 111/86 in Left arm

## 2019-07-18 NOTE — H&P (Addendum)
History and Physical    Ann Booth RXV:[id number] DOB: 1927-01-03 DOA: 07/18/2019  PCP: Biagio Borg, MD   Patient coming from: [facility] I have personally briefly reviewed patient's old medical records in [place]  Chief Complaint:  Fall  HPI: Ann Booth is a 86 y.o. female with medical history significant of persistent atrial fibrillation, chronic congestive heart failure with reduced ejection fraction (EF 30-35%), coronary artery disease status post acute anterior STEMI February 2021,  severe pulmonary hypertension, valvular heart disease with moderate MR, moderate to severe TR, hypertension, hyperlipidemia, generalized anxiety disorder who came to hospital for evaluation of mechanical fall.  Patient was admitted to the hospital in February 2021 with diagnosis of acute anterior MI with STEMI criteria.  She was found to have new onset of systolic congestive heart failure, which reduced ejection fraction 30 to 35%, previously preserved in 2019, managed medically due to late presentation.   Patient presented to hospital tonight, complains of left shoulder pain, after traumatic fall, which she sustained 2 days ago.  In addition patient reports chronic dyspnea, with chronic lower extremity edema, which according to patient's are at the baseline.  No complaints of chest pain.  No fever chills, cough, nausea vomiting, abdominal pain, dysuria or diarrhea.  In the ED on arrival patient was found to be tachypneic, respiratory 23/min with tachycardia 126/min, mild hypotension, 88/61. With time her vitals stabilized.  Currently she remains afebrile with blood pressure 111/86, heart rate 96/min, 100% saturation on 2 L of oxygen. CBC showed no leukocytosis or anemia.  Chemistry revealed mild elevated creatinine to 1.1, with hypokalemia 2.9, mild elevation of LFTs, AST 65 and ALT 62.  CT head and CT cervical spine, show moderate ischemic vascular disease, chronic old  T3 compression fracture. CT chest showed moderate to large bilateral pleural effusion, with mild acute pleural edema, possible loculation of pleural effusion of the left.  X-ray of the left clavicle show a minimally displaced acute fracture of the left distal clavicle.  Patient will be admitted to hospital for further evaluation and treatment of her condition  Review of Systems: Review of Systems  Constitutional: Negative for chills and fever.  HENT: Negative for congestion, hearing loss and nosebleeds.   Eyes: Negative for blurred vision and double vision.  Respiratory: Positive for shortness of breath. Negative for cough, sputum production and wheezing.   Cardiovascular: Positive for leg swelling. Negative for chest pain and palpitations.  Gastrointestinal: Negative for abdominal pain, constipation and heartburn.  Genitourinary: Negative for dysuria, frequency and urgency.  Musculoskeletal: Positive for falls and joint pain. Negative for myalgias.  Skin: Negative for rash.  Neurological: Negative for dizziness and headaches.  Psychiatric/Behavioral: Negative for depression and suicidal ideas. The patient is nervous/anxious.     As per HPI otherwise all other systems reviewed and are negative.  Past Medical History:  Diagnosis Date  . ALLERGIC RHINITIS   . ANXIETY   . ASTHMA   . Atrial fibrillation (Onset)   . BACK PAIN   . Carotid stenosis 04/27/2012   Mild left on community screening  - Nov 2013  . CATARACT NOS   . DEPRESSION   . GLAUCOMA NOS   . HTN (hypertension)   . Hypercalcemia   . MITRAL VALVE PROLAPSE   . OSTEOPOROSIS   . PERIPHERAL NEUROPATHY   . POSITIONAL VERTIGO   . Rosacea     Past Surgical History:  Procedure Laterality Date  .  ankle surgury  left ankle  . CATARACT EXTRACTION, BILATERAL    . HEMORRHOID SURGERY    . posterior chamber interocular lens implant     Social History  reports that she has never smoked. She has never used smokeless tobacco.  She reports that she does not drink alcohol or use drugs.  Allergies  Allergen Reactions  . Cymbalta [Duloxetine Hcl] Other (See Comments)    Family History  Problem Relation Age of Onset  . CAD Mother        MI at age 77  . Cancer Sister        colon  . Cancer Brother        prostate cancer   Prior to Admission medications   Medication Sig Start Date End Date Taking? Authorizing Provider  aspirin 81 MG chewable tablet Chew 81 mg by mouth daily.   Yes [provider]  atorvastatin (LIPITOR) 40 MG tablet Take 1 tablet (40 mg total) by mouth daily at 6 PM. Patient taking differently: Take 40 mg by mouth daily at 8 pm.  04/12/19  Yes Duke, Roe Rutherford, PA  benzonatate (TESSALON) 100 MG capsule Take 100 mg by mouth 3 (three) times daily as needed for cough.   Yes [provider]  citalopram (CELEXA) 10 MG tablet Take 1 tablet (10 mg total) by mouth daily. 03/22/19 03/21/20 Yes Corwin Levins, MD  donepezil (ARICEPT) 10 MG tablet Take 10 mg by mouth at bedtime.   Yes [provider]  furosemide (LASIX) 20 MG tablet TAKE 1 TABLET BY MOUTH ONCE DAILY AS NEEDED FOR SWELLING Patient taking differently: Take 20 mg by mouth daily.  03/29/18  Yes Lewayne Bunting, MD  isosorbide dinitrate (ISORDIL) 10 MG tablet Take 1 tablet (10 mg total) by mouth 2 (two) times daily. 04/12/19  Yes Duke, Roe Rutherford, PA  lidocaine (LIDODERM) 5 % Place 1 patch onto the skin daily. Remove & Discard patch within 12 hours or as directed by MD Patient taking differently: Place 1 patch onto the skin daily. Apply 1 patch to affected area every morning (8AM) and remove in the evening (8PM) 10/05/17  Yes Corwin Levins, MD  metoprolol tartrate (LOPRESSOR) 50 MG tablet Take 1 tablet (50 mg total) by mouth 2 (two) times daily. 04/12/19  Yes Duke, Roe Rutherford, PA  nitroGLYCERIN (NITROSTAT) 0.4 MG SL tablet Place 1 tablet (0.4 mg total) under the tongue every 5 (five) minutes x 3 doses as needed for  chest pain. 04/12/19  Yes Duke, Roe Rutherford, PA  Nutritional Supplements (NUTRITIONAL DRINK) LIQD Take 90 mLs by mouth daily. Med Pass   Yes [provider]  Rivaroxaban (XARELTO) 15 MG TABS tablet Take 1 tablet (15 mg total) by mouth daily with supper. Patient taking differently: Take 15 mg by mouth daily at 8 pm.  11/03/18  Yes Crenshaw, Madolyn Frieze, MD  timolol (TIMOPTIC) 0.5 % ophthalmic solution Place 1 drop into both eyes in the morning and at bedtime. 06/06/19  Yes [provider]  traMADol (ULTRAM) 50 MG tablet Take 1 tablet (50 mg total) by mouth every 6 (six) hours as needed. Patient taking differently: Take 50 mg by mouth every 6 (six) hours as needed for moderate pain or severe pain.  06/17/18  Yes Corwin Levins, MD  ALPRAZolam Prudy Feeler) 0.5 MG tablet 1/2 - 1 tab by mouth at bedtime as needed Patient not taking: Reported on 04/08/2019 03/16/17   Corwin Levins, MD  aspirin EC 81 MG EC  tablet Take 1 tablet (81 mg total) by mouth daily. Patient not taking: Reported on 07/18/2019 04/13/19   Marcelino Duster, PA  donepezil (ARICEPT) 5 MG tablet Take 1 tablet (5 mg total) by mouth at bedtime. Patient not taking: Reported on 07/18/2019 03/22/19   Corwin Levins, MD    Physical Exam: Vitals:   07/18/19 1500 07/18/19 1615 07/18/19 1630 07/18/19 1644  BP: (!) 89/66 103/80 (!) 111/95 111/86  Pulse: 79   96  Resp: Temp:      TempSrc:      SpO2: 100%   100%    Constitutional: NAD, slightly tachypneic but not in distress Vitals:   07/18/19 1500 07/18/19 1615 07/18/19 1630 07/18/19 1644  BP: (!) 89/66 103/80 (!) 111/95 111/86  Pulse: 79   96  Resp: Temp:      TempSrc:      SpO2: 100%   100%   Eyes: PERRL, lids and conjunctivae normal ENMT: Mucous membranes are moist. Posterior pharynx clear of any exudate or lesions.Normal dentition.  Neck: normal, supple, no masses, no thyromegaly Respiratory: Decreased breath sounds bilaterally at the bottom,  bibasal rales. No accessory muscle use.  Cardiovascular: Irregular rate and rhythm, 2/6 systolic murmur.  +2 lower extremity edema. 2+ pedal pulses. No carotid bruits.  Abdomen: no tenderness, no masses palpated. No hepatosplenomegaly. Bowel sounds positive.  Musculoskeletal: no clubbing / cyanosis. No joint deformity upper and lower extremities. Good ROM, no contractures. Normal muscle tone.  Skin: no rashes, lesions, ulcers. No induration Neurologic: CN 2-12 grossly intact. Sensation intact, DTR normal. Strength 5/5 in all 4.  Psychiatric: Normal judgment and insight. Alert and oriented x 3. Normal mood.   Labs on Admission: I have personally reviewed following labs and imaging studies  CBC: Recent Labs  Lab 07/18/19 1615  WBC 6.2  NEUTROABS 3.7  HGB 14.1  HCT 44.6  MCV 92.7  PLT 125*    Basic Metabolic Panel: Recent Labs  Lab 07/18/19 1615  NA 143  K 2.9*  CL 100  CO2 31  GLUCOSE 87  BUN 19  CREATININE 1.18*  CALCIUM 8.6*    GFR: CrCl cannot be calculated (Unknown ideal weight.).  Liver Function Tests: Recent Labs  Lab 07/18/19 1615  AST 65*  ALT 62*  ALKPHOS 146*  BILITOT 1.3*  PROT 5.4*  ALBUMIN 2.3*    Urine analysis:    Component Value Date/Time   COLORURINE YELLOW 06/22/2018 1955   APPEARANCEUR CLEAR 06/22/2018 1955   LABSPEC 1.013 06/22/2018 1955   PHURINE 7.0 06/22/2018 1955   GLUCOSEU NEGATIVE 06/22/2018 1955   GLUCOSEU NEGATIVE 06/17/2018 1457   HGBUR NEGATIVE 06/22/2018 1955   BILIRUBINUR NEGATIVE 06/22/2018 1955   KETONESUR 20 (A) 06/22/2018 1955   PROTEINUR NEGATIVE 06/22/2018 1955   UROBILINOGEN 0.2 06/17/2018 1457   NITRITE NEGATIVE 06/22/2018 1955   LEUKOCYTESUR NEGATIVE 06/22/2018 1955    Radiological Exams on Admission: DG Ribs Unilateral W/Chest Left  Result Date: 07/18/2019 CLINICAL DATA:  Larey Seat 1 transferring to a chair yesterday EXAM: LEFT RIBS AND CHEST - 3+ VIEW COMPARISON:  Chest radiograph 04/08/2019 FINDINGS:  Enlargement of cardiac silhouette. Atherosclerotic calcification aorta. Small RIGHT pleural effusion and basilar atelectasis. Moderate LEFT pleural effusion with significant atelectasis of the lower LEFT lung. Peribronchial thickening and scattered interstitial changes again identified. No pneumothorax. Diffuse osseous demineralization. No definite LEFT rib fractures identified. Minimally displaced fracture of the distal LEFT clavicle is identified however.  IMPRESSION: Moderate LEFT pleural effusion and significant atelectasis of the lower LEFT lung. Small RIGHT pleural effusion and mild RIGHT basilar atelectasis. Minimally displaced fracture distal LEFT clavicle. No definite rib fractures identified. Electronically Signed   By: Ulyses Southward M.D.   On: 07/18/2019 15:56   DG Clavicle Left  Result Date: 07/18/2019 CLINICAL DATA:  Larey Seat when transferring to a chair yesterday, dementia EXAM: LEFT CLAVICLE - 2+ VIEWS COMPARISON:  Chest radiograph 04/08/2019 FINDINGS: Osseous demineralization. AC joint alignment normal. Clavicular head symmetric. Minimally displaced fracture of the distal LEFT clavicle. LEFT pleural effusion present. No additional fracture seen. IMPRESSION: Minimally displaced fracture of the distal LEFT clavicle. Electronically Signed   By: Ulyses Southward M.D.   On: 07/18/2019 15:58   CT Head Wo Contrast  Result Date: 07/18/2019 CLINICAL DATA:  Head trauma. Fall yesterday with clavicle fracture. EXAM: CT HEAD WITHOUT CONTRAST CT CERVICAL SPINE WITHOUT CONTRAST TECHNIQUE: Multidetector CT imaging of the head and cervical spine was performed following the standard protocol without intravenous contrast. Multiplanar CT image reconstructions of the cervical spine were also generated. COMPARISON:  Head CT 06/22/2018, head MRI 06/23/2018, and cervical spine CT 06/11/2018 FINDINGS: CT HEAD FINDINGS Brain: There is no evidence of acute infarct, intracranial hemorrhage, mass, midline shift, or extra-axial fluid  collection. Cerebral atrophy is within normal limits for age. Hypodensities in the cerebral white matter bilaterally are unchanged and nonspecific but compatible with moderate chronic small vessel ischemic disease. Vascular: Calcified atherosclerosis at the skull base. No hyperdense vessel. Skull: No fracture or suspicious osseous lesion. Sinuses/Orbits: Visualized paranasal sinuses and mastoid air cells are clear. Bilateral cataract extraction. Other: None. CT CERVICAL SPINE FINDINGS The study is mildly motion degraded. Alignment: Cervical spine straightening with grade 1 anterolisthesis of C3 on C4, C4 on C5, C5 on C6, and C7 on T1, likely degenerative. Skull base and vertebrae: No acute fracture is identified within limitations of motion artifact. No destructive osseous process. Unchanged chronic T3 compression fracture with severe anterior vertebral body height loss. Soft tissues and spinal canal: No prevertebral fluid or swelling. No visible canal hematoma. Disc levels: Moderate cervical disc and facet degeneration, similar to the prior CT. Upper chest: Reported separately. Other: None. IMPRESSION: 1. No evidence of acute intracranial abnormality. 2. Moderate chronic small vessel ischemic disease. 3. No acute cervical spine fracture identified within limitations of motion artifact. 4. Chronic T3 compression fracture. Electronically Signed   By: Sebastian Ache M.D.   On: 07/18/2019 18:42   CT Chest W Contrast  Result Date: 07/18/2019 CLINICAL DATA:  86 year old female with history of chest pain and shortness of breath. History of trauma from a fall. EXAM: CT CHEST WITH CONTRAST TECHNIQUE: Multidetector CT imaging of the chest was performed during intravenous contrast administration. CONTRAST:  46mL OMNIPAQUE IOHEXOL 300 MG/ML  SOLN COMPARISON:  Chest CT 07/11/2010. FINDINGS: Cardiovascular: Heart size is enlarged with biatrial dilatation. There is no significant pericardial fluid, thickening or pericardial  calcification. There is aortic atherosclerosis, as well as atherosclerosis of the great vessels of the mediastinum and the coronary arteries, including calcified atherosclerotic plaque in the left anterior descending and right coronary arteries. Thickening and calcification of the aortic valve. Mediastinum/Nodes: No pathologically enlarged mediastinal or hilar lymph nodes. Esophagus is unremarkable in appearance. Axillary lymphadenopathy. Lungs/Pleura: Moderate to large bilateral pleural effusions. The right pleural effusion layers dependently, where as the left pleural effusion appears partially loculated laterally. These are associated with areas of extensive passive atelectasis in the lower lobes of the  lungs bilaterally. Patchy areas of ground-glass attenuation in the upper lungs bilaterally with some associated interlobular septal thickening, favored to reflect some mild pulmonary edema. No definite suspicious appearing pulmonary nodules or masses are noted. Upper Abdomen: Aortic atherosclerosis. Musculoskeletal: Chronic appearing compression fracture of T3 with 80% loss of anterior vertebral body height and 30% loss of posterior vertebral body height. IMPRESSION: 1. Cardiomegaly with biatrial dilatation, evidence of mild pulmonary edema and moderate to large bilateral pleural effusions; imaging findings suggestive of congestive heart failure. 2. The left pleural effusion appears potentially partially loculated laterally, while the right pleural effusion is simple in appearance. 3. Aortic atherosclerosis, in addition to two vessel coronary artery disease. 4. There are calcifications of the aortic valve. Echocardiographic correlation for evaluation of potential valvular dysfunction may be warranted if clinically indicated. Aortic Atherosclerosis (ICD10-I70.0). Electronically Signed   By: Trudie Reed M.D.   On: 07/18/2019 18:47   CT Cervical Spine Wo Contrast  Result Date: 07/18/2019 CLINICAL DATA:  Head  trauma. Fall yesterday with clavicle fracture. EXAM: CT HEAD WITHOUT CONTRAST CT CERVICAL SPINE WITHOUT CONTRAST TECHNIQUE: Multidetector CT imaging of the head and cervical spine was performed following the standard protocol without intravenous contrast. Multiplanar CT image reconstructions of the cervical spine were also generated. COMPARISON:  Head CT 06/22/2018, head MRI 06/23/2018, and cervical spine CT 06/11/2018 FINDINGS: CT HEAD FINDINGS Brain: There is no evidence of acute infarct, intracranial hemorrhage, mass, midline shift, or extra-axial fluid collection. Cerebral atrophy is within normal limits for age. Hypodensities in the cerebral white matter bilaterally are unchanged and nonspecific but compatible with moderate chronic small vessel ischemic disease. Vascular: Calcified atherosclerosis at the skull base. No hyperdense vessel. Skull: No fracture or suspicious osseous lesion. Sinuses/Orbits: Visualized paranasal sinuses and mastoid air cells are clear. Bilateral cataract extraction. Other: None. CT CERVICAL SPINE FINDINGS The study is mildly motion degraded. Alignment: Cervical spine straightening with grade 1 anterolisthesis of C3 on C4, C4 on C5, C5 on C6, and C7 on T1, likely degenerative. Skull base and vertebrae: No acute fracture is identified within limitations of motion artifact. No destructive osseous process. Unchanged chronic T3 compression fracture with severe anterior vertebral body height loss. Soft tissues and spinal canal: No prevertebral fluid or swelling. No visible canal hematoma. Disc levels: Moderate cervical disc and facet degeneration, similar to the prior CT. Upper chest: Reported separately. Other: None. IMPRESSION: 1. No evidence of acute intracranial abnormality. 2. Moderate chronic small vessel ischemic disease. 3. No acute cervical spine fracture identified within limitations of motion artifact. 4. Chronic T3 compression fracture. Electronically Signed   By: Sebastian Ache  M.D.   On: 07/18/2019 18:42   DG Hip Unilat W or Wo Pelvis 2-3 Views Left  Result Date: 07/18/2019 CLINICAL DATA:  Larey Seat when transferring to a chair yesterday, tenderness and pain EXAM: DG HIP (WITH OR WITHOUT PELVIS) 2-3V LEFT COMPARISON:  None FINDINGS: Diffuse osseous demineralization. Joint spaces preserved. No acute fracture, dislocation, or bone destruction. Degenerative changes and scoliosis at visualized lumbar spine. Pelvic phleboliths noted. IMPRESSION: No acute osseous abnormalities. Electronically Signed   By: Ulyses Southward M.D.   On: 07/18/2019 15:53   EKG: Independently reviewed.   Assessment/Plan Principal Problem:   Acute systolic (congestive) heart failure (HCC) Active Problems:   Hyperlipidemia   Anxiety state   Depression   HTN (hypertension)   Atrial fibrillation (HCC)   Ischemic cardiomyopathy   Acute pulmonary edema (HCC)   Acute respiratory failure with hypoxia (HCC)  Fracture of left clavicle   Fall at home, initial encounter   Acute on chronic exacerbation of systolic congestive heart failure (CHFrEF) Acute respiratory failure with hypoxemia Mild acute pulmonary edema Valvular heart disease moderate to severe MR and TR Severe pulmonary hypertension Patient will be admitted to hospital for optimization of congestive heart failure. Start IV Lasix 40 mg twice daily.  Monitor daily weights, input output.  Check proBNP Continue medical management with aspirin, beta-blocker, statin and nitrates. Patient is not on ACE inhibitors or ARB due to hypotension  Bilateral pleural effusion   Likely effect of CHF.  There is a small loculation of the left side, which might require thoracentesis. Discussed with patient risk and benefit of thoracentesis She prefers trial of diuretic first, deferred procedures to the later time if necessary. She is not in respiratory distress, mildly hypoxic on 2 liters of oxygen.  Coronary artery disease Ischemic cardiomyopathy with  reduced fraction EF 30-35% No evidence of acute coronary syndrome. Continue medical management for ischemic heart disease, with aspirin, beta-blocker, statin and nitrates  Hypokalemia      Replace accordingly with oral and intravenous potassium rotation  Persistent atrial fibrillation Heart rate is suboptimally controlled.  Continue metoprolol, anticoagulation with Xarelto  Elevated LFTs Suspect from passive liver congestion from CHF. Monitor to resolution.  Sustained mechanical fall at home with acute left clavicle fracture Continue supportive care with sling.  There is no indication for acute surgical intervention.  Follow-up with orthopedic surgeon as outpatient  Hypertension     Blood pressure low normal.  Continue metoprolol, Xarelto  Hyperlipidemia    Continue atorvastatin  Generalized anxiety disorder AND  Major depressive disorder Mood appears to be stable. Continue Celexa  Mild cognitive impairment    Continue Aricept at the bedtime   DVT prophylaxis: Xarelto Code Status:        DNR Family Communication:  Nephew  Disposition Plan:   Patient is from: SNF               Anticipated DC to:  SNF/ALF/independent living   Anticipated DC date:  3-4 days  Anticipated DC barriers: None Consults called:   None  Admission status:  Inpatient    Severity of Illness: The appropriate patient status for this patient is INPATIENT. Inpatient status is judged to be reasonable and necessary in order to provide the required intensity of service to ensure the patient's safety. The patient's presenting symptoms, physical exam findings, and initial radiographic and laboratory data in the context of their chronic comorbidities is felt to place them at high risk for further clinical deterioration. Furthermore, it is not anticipated that the patient will be medically stable for discharge from the hospital within 2 midnights of admission. The following factors support the patient status of  inpatient.   " The patient's presenting symptoms include:  dyspne due to acute CHFrEF " The worrisome physical exam findings include : rales, decreased breath sounds  " The initial radiographic and laboratory data are worrisome because of : Bilateral moderate to large pleural effusions " The chronic co-morbidities include: CHF, A. fib, hypertension, hyperlipidemia  * I certify that at the point of admission it is my clinical judgment that the patient will require inpatient hospital care spanning beyond 2 midnights from the point of admission due to high intensity of service, high risk for further deterioration and high frequency of surveillance required.Youlanda Roys MD [facility]  How to contact the [facility] Attending or  Consulting provider 7A - 7P or covering provider during after hours 7P -7A, for this patient?   1. Check the care team in [facility] and look for a) attending/consulting [facility] provider listed and b) the [facility] team listed 2. Log into [email or web address] and use [place]'s universal password to access. If you do not have the password, please contact the hospital operator. 3. Locate the [facility] provider you are looking for under [facility] and page to a number that you can be directly reached. 4. If you still have difficulty reaching the provider, please page the [facility] (Director on Call) for the Hospitalists listed on amion for assistance.  07/18/2019, 8:09 PM

## 2019-07-18 NOTE — ED Notes (Addendum)
Pt went to x-ray.

## 2019-07-19 DIAGNOSIS — I4821 Permanent atrial fibrillation: Secondary | ICD-10-CM

## 2019-07-19 DIAGNOSIS — I5021 Acute systolic (congestive) heart failure: Secondary | ICD-10-CM

## 2019-07-19 DIAGNOSIS — J810 Acute pulmonary edema: Secondary | ICD-10-CM

## 2019-07-19 LAB — BASIC METABOLIC PANEL
Anion gap: 10 (ref 5–15)
BUN: 16 mg/dL (ref 8–23)
CO2: 32 mmol/L (ref 22–32)
Calcium: 8.7 mg/dL — ABNORMAL LOW (ref 8.9–10.3)
Chloride: 103 mmol/L (ref 98–111)
Creatinine, Ser: 0.96 mg/dL (ref 0.44–1.00)
GFR calc Af Amer: 60 mL/min — ABNORMAL LOW (ref >60)
GFR calc non Af Amer: 52 mL/min — ABNORMAL LOW (ref >60)
Glucose, Bld: 89 mg/dL (ref 70–99)
Potassium: 3.4 mmol/L — ABNORMAL LOW (ref 3.5–5.1)
Sodium: 145 mmol/L (ref 135–145)

## 2019-07-19 LAB — GLUCOSE, CAPILLARY: Glucose-Capillary: 106 mg/dL — ABNORMAL HIGH (ref 70–99)

## 2019-07-19 MED ORDER — METOPROLOL TARTRATE 12.5 MG HALF TABLET
12.5000 mg | ORAL_TABLET | Freq: Two times a day (BID) | ORAL | Status: DC
  Administered 2019-07-19: 12.5 mg via ORAL
  Filled 2019-07-19: qty 1

## 2019-07-19 MED ORDER — DIGOXIN 125 MCG PO TABS
0.1250 mg | ORAL_TABLET | Freq: Every day | ORAL | Status: DC
  Administered 2019-07-19 – 2019-07-22 (×4): 0.125 mg via ORAL
  Filled 2019-07-19 (×4): qty 1

## 2019-07-19 MED ORDER — MIDODRINE HCL 5 MG PO TABS
5.0000 mg | ORAL_TABLET | Freq: Three times a day (TID) | ORAL | Status: DC
  Administered 2019-07-21 – 2019-07-22 (×4): 5 mg via ORAL
  Filled 2019-07-19 (×4): qty 1

## 2019-07-19 NOTE — Consult Note (Signed)
Cardiology Consultation:   Patient ID: [name] MRN: [id number]; DOB: Aug 23, 1926  Admit date: 07/18/2019 Date of Consult: 07/19/2019  Primary Care Provider: Biagio Borg, MD Primary Cardiologist: Kirk Ruths, MD  Primary Electrophysiologist:  None    Patient Profile:   Ann Booth is a 86 y.o. female with a hx of persistent afib on Xarelto, moderate MR, moderate to severe TR, chronic diastolic CHF, ischemic cardiomyopathy (EF newly reduced to 30-35% in 03/2019), pulmonary hypertension, CAD s/p NSTEMI in February 2021 treated medically, cognitive impairment, HTN, HLD, and anxiety who is being seen today for the evaluation of CHF at the request of Dr. Tyrell Antonio.  History of Present Illness:   [name] is followed by Dr. Stanford Breed for the above cardiac issues. She had a monitor in August 2014 showing paroxysmal afib. Toprol previously discontinued due to bradycardia. Echo in November 2019 showed normal LV, prolapse of anterior mitral valve leaflet with moderate MR, severe left atrial enlargement, mod to severe TR. Was seen in ER in May 2020 with atypical chest pain with negative enzymes. MRI of brain in April 2020 showed possible subacute small ischemic infarct.   The patient was admitted by cardiology 04/08/19- 2/16 for chest pain found to have acute anterior MI. Due to late presentation, clinical status, and Xarelto use the decision was made to treat medically and received 48hr of IV heparin. Echo showed reduced EF 30-35% discharged with Lasix 20 mg daily. Discharged to SNF. Patient has not been seen in follow-up.   The patient presented to the ED 07/18/19 for left shoulder pain after traumatic fall. The nursing home did an X-ray that showed fractured left clavicle. Difficult to obtain history from the patient. She also reported worsening dyspnea and lower extremity edema. Denies chest pain. No N/V or abd pain. Has had diarrhea for 3 days.  In the ED pressures were soft and she  given gentle hydration with 288m bolus. She was tachycardic. Started on 2LO2. potassium 2.9, creatinine 1.18, alk phos 146, albumin 2.3, AST 65, ALT 62, WBC 6.2, Hgb 14.1. COVID negative. CXR showed minimally displaced distal left clavicle fracture and bilateral pleural effusions. CT chest showed possible hemothorax, cardiomegaly with biatrial dilation, evidence of mild pulmonary edema and moderate to large B/L pleural effusion (likely CHF), loculated pleural effusion on the left. CT head nonacute. BNP 1,061.   Past Medical History:  Diagnosis Date  . ALLERGIC RHINITIS   . ANXIETY   . ASTHMA   . Atrial fibrillation ([place]   . BACK PAIN   . Carotid stenosis 04/27/2012   Mild left on community screening  - Nov 2013  . CATARACT NOS   . DEPRESSION   . GLAUCOMA NOS   . HTN (hypertension)   . Hypercalcemia   . MITRAL VALVE PROLAPSE   . OSTEOPOROSIS   . PERIPHERAL NEUROPATHY   . POSITIONAL VERTIGO   . Rosacea     Past Surgical History:  Procedure Laterality Date  .  ankle surgury     left ankle  . CATARACT EXTRACTION, BILATERAL    . HEMORRHOID SURGERY    . posterior chamber interocular lens implant       Home Medications:  Prior to Admission medications   Medication Sig Start Date End Date Taking? Authorizing Provider  aspirin 81 MG chewable tablet Chew 81 mg by mouth daily.   Yes [provider]  atorvastatin (LIPITOR) 40 MG tablet Take 1 tablet (40 mg total) by mouth daily at 6 PM. Patient  taking differently: Take 40 mg by mouth daily at 8 pm.  04/12/19  Yes Duke, Tami Lin, PA  benzonatate (TESSALON) 100 MG capsule Take 100 mg by mouth 3 (three) times daily as needed for cough.   Yes [provider]  citalopram (CELEXA) 10 MG tablet Take 1 tablet (10 mg total) by mouth daily. 03/22/19 03/21/20 Yes Biagio Borg, MD  donepezil (ARICEPT) 10 MG tablet Take 10 mg by mouth at bedtime.   Yes [provider]  furosemide (LASIX) 20 MG tablet TAKE 1 TABLET BY  MOUTH ONCE DAILY AS NEEDED FOR SWELLING Patient taking differently: Take 20 mg by mouth daily.  03/29/18  Yes Lelon Perla, MD  isosorbide dinitrate (ISORDIL) 10 MG tablet Take 1 tablet (10 mg total) by mouth 2 (two) times daily. 04/12/19  Yes Duke, Tami Lin, PA  lidocaine (LIDODERM) 5 % Place 1 patch onto the skin daily. Remove & Discard patch within 12 hours or as directed by MD Patient taking differently: Place 1 patch onto the skin daily. Apply 1 patch to affected area every morning (8AM) and remove in the evening (8PM) 10/05/17  Yes Biagio Borg, MD  metoprolol tartrate (LOPRESSOR) 50 MG tablet Take 1 tablet (50 mg total) by mouth 2 (two) times daily. 04/12/19  Yes Duke, Tami Lin, PA  nitroGLYCERIN (NITROSTAT) 0.4 MG SL tablet Place 1 tablet (0.4 mg total) under the tongue every 5 (five) minutes x 3 doses as needed for chest pain. 04/12/19  Yes Duke, Tami Lin, PA  Nutritional Supplements (NUTRITIONAL DRINK) LIQD Take 90 mLs by mouth daily. Med Pass   Yes [provider]  Rivaroxaban (XARELTO) 15 MG TABS tablet Take 1 tablet (15 mg total) by mouth daily with supper. Patient taking differently: Take 15 mg by mouth daily at 8 pm.  11/03/18  Yes Crenshaw, Denice Bors, MD  timolol (TIMOPTIC) 0.5 % ophthalmic solution Place 1 drop into both eyes in the morning and at bedtime. 06/06/19  Yes [provider]  traMADol (ULTRAM) 50 MG tablet Take 1 tablet (50 mg total) by mouth every 6 (six) hours as needed. Patient taking differently: Take 50 mg by mouth every 6 (six) hours as needed for moderate pain or severe pain.  06/17/18  Yes Biagio Borg, MD    Inpatient Medications: Scheduled Meds: . aspirin  81 mg Oral Daily  . atorvastatin  40 mg Oral Q2000  . citalopram  10 mg Oral Daily  . donepezil  10 mg Oral QHS  . furosemide  40 mg Intravenous Q12H  . lidocaine  1 patch Transdermal Q24H  . metoprolol tartrate  12.5 mg Oral BID  . Rivaroxaban  15 mg Oral Q2000  . sodium  chloride flush  3 mL Intravenous Q12H  . timolol  1 drop Both Eyes BID   Continuous Infusions: . sodium chloride     PRN Meds: sodium chloride, acetaminophen, ondansetron (ZOFRAN) IV, sodium chloride flush, traMADol  Allergies:    Allergies  Allergen Reactions  . Cymbalta [Duloxetine Hcl] Other (See Comments)    Social History:   Social History   Socioeconomic History  . Marital status: Widowed    Spouse name: Not on file  . Number of children: 0  . Years of education: Not on file  . Highest education level: Not on file  Occupational History  . Occupation: retired Designer, industrial/product: RETIRED  Tobacco Use  . Smoking status: Never Smoker  . Smokeless tobacco:  Never Used  Substance and Sexual Activity  . Alcohol use: No  . Drug use: No  . Sexual activity: Never  Other Topics Concern  . Not on file  Social History Narrative  . Not on file   Social Determinants of Health   Financial Resource Strain:   . Difficulty of Paying Living Expenses:   Food Insecurity:   . Worried About Charity fundraiser in the Last Year:   . Arboriculturist in the Last Year:   Transportation Needs:   . Film/video editor (Medical):   Marland Kitchen Lack of Transportation (Non-Medical):   Physical Activity:   . Days of Exercise per Week:   . Minutes of Exercise per Session:   Stress:   . Feeling of Stress :   Social Connections:   . Frequency of Communication with Friends and Family:   . Frequency of Social Gatherings with Friends and Family:   . Attends Religious Services:   . Active Member of Clubs or Organizations:   . Attends Archivist Meetings:   Marland Kitchen Marital Status:   Intimate Partner Violence:   . Fear of Current or Ex-Partner:   . Emotionally Abused:   Marland Kitchen Physically Abused:   . Sexually Abused:     Family History:   Family History  Problem Relation Age of Onset  . CAD Mother        MI at age 77  . Cancer Sister        colon  . Cancer Brother        prostate  cancer     ROS:  Please see the history of present illness.  All other ROS reviewed and negative.     Physical Exam/Data:   Vitals:   07/19/19 0856 07/19/19 0900 07/19/19 1100 07/19/19 1116  BP: 107/81  103/74 (!) 86/69  Pulse: (!) 107  85 75  Resp:   18 16  Temp:    (!) 97.4 F (36.3 C)  TempSrc:    Oral  SpO2:  98% 97% 97%  Weight:      Height:        Intake/Output Summary (Last 24 hours) at 07/19/2019 1534 Last data filed at 07/19/2019 1300 Gross per 24 hour  Intake 240 ml  Output 1480 ml  Net -1240 ml   Last 3 Weights 07/19/2019 07/18/2019 04/18/2019  Weight (lbs) 122 lb 2.2 oz 128 lb 4.9 oz 119 lb  Weight (kg) 55.4 kg 58.2 kg 53.978 kg     Body mass index is 22.34 kg/m.  General:  Frail elderly female in no acute distress HEENT: normal Lymph: no adenopathy Neck: + JVD Endocrine:  No thryomegaly Vascular: No carotid bruits; FA pulses 2+ bilaterally without bruits  Cardiac:  normal S1, S2; RRR; + murmur  Lungs:  Crackles at bases; 2L O2 Abd: soft, nontender, no hepatomegaly  Ext: 1+ edema Musculoskeletal:  No deformities, BUE and BLE strength normal and equal Skin: warm and dry  Neuro:  Mild to mod cognitive impairment Psych:  Normal affect   EKG:  The EKG was personally reviewed and demonstrates:  Afib 73 bpm, nonspecific ST/T wave abnormality Telemetry:  Telemetry was personally reviewed and demonstrates:  Afib with rates  Relevant CV Studies:  Echo 04/08/19 1. Left ventricular ejection fraction, by estimation, is 30 to 35%. The  left ventricle has moderate to severely decreased function. The left  ventricle demonstrates regional wall motion abnormalities (see scoring  diagram/findings for description). There  is mildly increased left ventricular hypertrophy. Left ventricular  diastolic parameters are indeterminate.  2. Right ventricular systolic function is moderately reduced. The right  ventricular size is normal. There is severely elevated pulmonary  artery  systolic pressure. The estimated right ventricular systolic pressure is  81.4 mmHg.  3. Left atrial size was severely dilated.  4. Right atrial size was severely dilated.  5. The mitral valve is degenerative. Moderate to severe mitral valve  regurgitation.  6. The tricuspid valve is abnormal. Tricuspid valve regurgitation is  moderate to severe.  7. The aortic valve is tricuspid. Aortic valve regurgitation is not  visualized. Mild to moderate aortic valve sclerosis/calcification is  present, without any evidence of aortic stenosis.  8. The inferior vena cava is dilated in size with <50% respiratory  variability, suggesting right atrial pressure of 15 mmHg.   Laboratory Data:  High Sensitivity Troponin:  No results for input(s): TROPONINIHS in the last 720 hours.   Chemistry Recent Labs  Lab 07/18/19 1615 07/19/19 0502  NA 143 145  K 2.9* 3.4*  CL 100 103  CO2 31 32  GLUCOSE 87 89  BUN 19 16  CREATININE 1.18* 0.96  CALCIUM 8.6* 8.7*  GFRNONAA 40* 52*  GFRAA 47* 60*  ANIONGAP 12 10    Recent Labs  Lab 07/18/19 1615  PROT 5.4*  ALBUMIN 2.3*  AST 65*  ALT 62*  ALKPHOS 146*  BILITOT 1.3*   Hematology Recent Labs  Lab 07/18/19 1615  WBC 6.2  RBC 4.81  HGB 14.1  HCT 44.6  MCV 92.7  MCH 29.3  MCHC 31.6  RDW 17.4*  PLT 125*   BNP Recent Labs  Lab 07/18/19 2017  BNP 1,061.2*    DDimer No results for input(s): DDIMER in the last 168 hours.   Radiology/Studies:  DG Ribs Unilateral W/Chest Left  Result Date: 07/18/2019 CLINICAL DATA:  [address] 1 transferring to a chair yesterday EXAM: LEFT RIBS AND CHEST - 3+ VIEW COMPARISON:  Chest radiograph 04/08/2019 FINDINGS: Enlargement of cardiac silhouette. Atherosclerotic calcification aorta. Small RIGHT pleural effusion and basilar atelectasis. Moderate LEFT pleural effusion with significant atelectasis of the lower LEFT lung. Peribronchial thickening and scattered interstitial changes again identified. No  pneumothorax. Diffuse osseous demineralization. No definite LEFT rib fractures identified. Minimally displaced fracture of the distal LEFT clavicle is identified however. IMPRESSION: Moderate LEFT pleural effusion and significant atelectasis of the lower LEFT lung. Small RIGHT pleural effusion and mild RIGHT basilar atelectasis. Minimally displaced fracture distal LEFT clavicle. No definite rib fractures identified. Electronically Signed   By: Lavonia Dana M.D.   On: 07/18/2019 15:56   DG Clavicle Left  Result Date: 07/18/2019 CLINICAL DATA:  [address] when transferring to a chair yesterday, dementia EXAM: LEFT CLAVICLE - 2+ VIEWS COMPARISON:  Chest radiograph 04/08/2019 FINDINGS: Osseous demineralization. AC joint alignment normal. Clavicular head symmetric. Minimally displaced fracture of the distal LEFT clavicle. LEFT pleural effusion present. No additional fracture seen. IMPRESSION: Minimally displaced fracture of the distal LEFT clavicle. Electronically Signed   By: Lavonia Dana M.D.   On: 07/18/2019 15:58   CT Head Wo Contrast  Result Date: 07/18/2019 CLINICAL DATA:  Head trauma. Fall yesterday with clavicle fracture. EXAM: CT HEAD WITHOUT CONTRAST CT CERVICAL SPINE WITHOUT CONTRAST TECHNIQUE: Multidetector CT imaging of the head and cervical spine was performed following the standard protocol without intravenous contrast. Multiplanar CT image reconstructions of the cervical spine were also generated. COMPARISON:  Head CT 06/22/2018, head MRI 06/23/2018, and cervical spine  CT 06/11/2018 FINDINGS: CT HEAD FINDINGS Brain: There is no evidence of acute infarct, intracranial hemorrhage, mass, midline shift, or extra-axial fluid collection. Cerebral atrophy is within normal limits for age. Hypodensities in the cerebral white matter bilaterally are unchanged and nonspecific but compatible with moderate chronic small vessel ischemic disease. Vascular: Calcified atherosclerosis at the skull base. No hyperdense  vessel. Skull: No fracture or suspicious osseous lesion. Sinuses/Orbits: Visualized paranasal sinuses and mastoid air cells are clear. Bilateral cataract extraction. Other: None. CT CERVICAL SPINE FINDINGS The study is mildly motion degraded. Alignment: Cervical spine straightening with grade 1 anterolisthesis of C3 on C4, C4 on C5, C5 on C6, and C7 on T1, likely degenerative. Skull base and vertebrae: No acute fracture is identified within limitations of motion artifact. No destructive osseous process. Unchanged chronic T3 compression fracture with severe anterior vertebral body height loss. Soft tissues and spinal canal: No prevertebral fluid or swelling. No visible canal hematoma. Disc levels: Moderate cervical disc and facet degeneration, similar to the prior CT. Upper chest: Reported separately. Other: None. IMPRESSION: 1. No evidence of acute intracranial abnormality. 2. Moderate chronic small vessel ischemic disease. 3. No acute cervical spine fracture identified within limitations of motion artifact. 4. Chronic T3 compression fracture. Electronically Signed   By: Logan Bores M.D.   On: 07/18/2019 18:42   CT Chest W Contrast  Result Date: 07/18/2019 CLINICAL DATA:  86 year old female with history of chest pain and shortness of breath. History of trauma from a fall. EXAM: CT CHEST WITH CONTRAST TECHNIQUE: Multidetector CT imaging of the chest was performed during intravenous contrast administration. CONTRAST:  34m OMNIPAQUE IOHEXOL 300 MG/ML  SOLN COMPARISON:  Chest CT 07/11/2010. FINDINGS: Cardiovascular: Heart size is enlarged with biatrial dilatation. There is no significant pericardial fluid, thickening or pericardial calcification. There is aortic atherosclerosis, as well as atherosclerosis of the great vessels of the mediastinum and the coronary arteries, including calcified atherosclerotic plaque in the left anterior descending and right coronary arteries. Thickening and calcification of the  aortic valve. Mediastinum/Nodes: No pathologically enlarged mediastinal or hilar lymph nodes. Esophagus is unremarkable in appearance. Axillary lymphadenopathy. Lungs/Pleura: Moderate to large bilateral pleural effusions. The right pleural effusion layers dependently, where as the left pleural effusion appears partially loculated laterally. These are associated with areas of extensive passive atelectasis in the lower lobes of the lungs bilaterally. Patchy areas of ground-glass attenuation in the upper lungs bilaterally with some associated interlobular septal thickening, favored to reflect some mild pulmonary edema. No definite suspicious appearing pulmonary nodules or masses are noted. Upper Abdomen: Aortic atherosclerosis. Musculoskeletal: Chronic appearing compression fracture of T3 with 80% loss of anterior vertebral body height and 30% loss of posterior vertebral body height. IMPRESSION: 1. Cardiomegaly with biatrial dilatation, evidence of mild pulmonary edema and moderate to large bilateral pleural effusions; imaging findings suggestive of congestive heart failure. 2. The left pleural effusion appears potentially partially loculated laterally, while the right pleural effusion is simple in appearance. 3. Aortic atherosclerosis, in addition to two vessel coronary artery disease. 4. There are calcifications of the aortic valve. Echocardiographic correlation for evaluation of potential valvular dysfunction may be warranted if clinically indicated. Aortic Atherosclerosis (ICD10-I70.0). Electronically Signed   By: DVinnie LangtonM.D.   On: 07/18/2019 18:47   CT Cervical Spine Wo Contrast  Result Date: 07/18/2019 CLINICAL DATA:  Head trauma. Fall yesterday with clavicle fracture. EXAM: CT HEAD WITHOUT CONTRAST CT CERVICAL SPINE WITHOUT CONTRAST TECHNIQUE: Multidetector CT imaging of the head and cervical spine was  performed following the standard protocol without intravenous contrast. Multiplanar CT image  reconstructions of the cervical spine were also generated. COMPARISON:  Head CT 06/22/2018, head MRI 06/23/2018, and cervical spine CT 06/11/2018 FINDINGS: CT HEAD FINDINGS Brain: There is no evidence of acute infarct, intracranial hemorrhage, mass, midline shift, or extra-axial fluid collection. Cerebral atrophy is within normal limits for age. Hypodensities in the cerebral white matter bilaterally are unchanged and nonspecific but compatible with moderate chronic small vessel ischemic disease. Vascular: Calcified atherosclerosis at the skull base. No hyperdense vessel. Skull: No fracture or suspicious osseous lesion. Sinuses/Orbits: Visualized paranasal sinuses and mastoid air cells are clear. Bilateral cataract extraction. Other: None. CT CERVICAL SPINE FINDINGS The study is mildly motion degraded. Alignment: Cervical spine straightening with grade 1 anterolisthesis of C3 on C4, C4 on C5, C5 on C6, and C7 on T1, likely degenerative. Skull base and vertebrae: No acute fracture is identified within limitations of motion artifact. No destructive osseous process. Unchanged chronic T3 compression fracture with severe anterior vertebral body height loss. Soft tissues and spinal canal: No prevertebral fluid or swelling. No visible canal hematoma. Disc levels: Moderate cervical disc and facet degeneration, similar to the prior CT. Upper chest: Reported separately. Other: None. IMPRESSION: 1. No evidence of acute intracranial abnormality. 2. Moderate chronic small vessel ischemic disease. 3. No acute cervical spine fracture identified within limitations of motion artifact. 4. Chronic T3 compression fracture. Electronically Signed   By: Logan Bores M.D.   On: 07/18/2019 18:42   DG Hip Unilat W or Wo Pelvis 2-3 Views Left  Result Date: 07/18/2019 CLINICAL DATA:  [address] when transferring to a chair yesterday, tenderness and pain EXAM: DG HIP (WITH OR WITHOUT PELVIS) 2-3V LEFT COMPARISON:  None FINDINGS: Diffuse osseous  demineralization. Joint spaces preserved. No acute fracture, dislocation, or bone destruction. Degenerative changes and scoliosis at visualized lumbar spine. Pelvic phleboliths noted. IMPRESSION: No acute osseous abnormalities. Electronically Signed   By: Lavonia Dana M.D.   On: 07/18/2019 15:53     Assessment and Plan:   Acute on chronic combined CHF/Acute respiratory failure - BNP 1,061 - CXR with B/L pleural effusions - started on IV lasix 40 mg BID - she takes lasix 20 mg daily at home - creatinine improved - patient has put out 3363m urine, net -1.2L - weight down 6 lbs - Echo 04/08/19 with new low EF 30-35% from NSTEMI - Lopressor 518mBID and isordil 10 mg daily at baseline. BB decreased to 12.63m98mID and nitrates held for hypotension - continue with diuresis. With soft pressures might need to hold BB for further IV lasix. MD to see  B/L pleural effusions - considering thoracentesis. CXR tomorrow - 2L O2  Mechanical fall with acute left clavicle fracture - supportive care with sling  Ischemic cardiomyopathy - EF in Feb 30-35% - continue BB - hypotension limiting medication titration  CAD s/p NSTEMI in Feb 2021 treated medically - no chest pain - continue aspirin and statin - Nitrates discontinued for hypotension   Diarrhea - cdiff ordered  Hypokalemia - repleted>> today 3.4 - goal>4  Elevated LFTs - possible from hepatic congestion  Persistent Afib - continue metoprolol and Xarelto - rates in the 80-90s  HTN - Most recent 86/69 but overall improving  HLD - continue atorvastatin  Goals of care - patient is DNR  For questions or updates, please contact [facility] Please consult [email or web address] for contact info under     Signed, Brailee Riede H FNinfa MeekerA-C  07/19/2019  3:34 PM

## 2019-07-19 NOTE — Progress Notes (Signed)
History and all data above reviewed.  Patient examined.  I agree with the findings as above. All available labs, radiology testing, previous records reviewed. Agree with documented assessment and plan. Ms. Byrd is a 77F with persistent atrial fibrillation, moderate to severe TR, chronic systolic and diastolic heart failure, CAD s/p NSTEMI (03/2019), hypertension, hyperlipidemia, and dementia admitted with acute on chronic heart failure.  She had an anterior NSTEMI 03/2019.  She presented late and was medically managed.  Echo at that time revealed LVEF 30 to 35%.  She was discharged to a SNF and was never followed up in cardiology clinic.  She presented 5/24 after a fall.  She was found to have a fractured left clavicle.  She was also noted to be in acute on chronic systolic and diastolic heart failure, with evidence of pulmonary edema on her chest x-ray.  BNP was 1061.  Cardiology was consulted and she was started on IV Lasix.  She is unable to provide much history.  She denies pain and seems comfortable on exam.  She is volume overloaded but blood pressure has limited the ability to diurese her.  We will start midodrine n and digoxin which should help with rate control.  Given her reduced systolic function we will switch metoprolol to succinate.  The dose has been reduced from 50 mg of tartrate twice daily to 25 mg of succinate daily.  Overall her condition is quite guarded as she is hypotensive and volume overloaded.  Agree with palliative care consultation and assessment of goals of care.   Kawanna Christley C. Duke Salvia, MD, [facility]  07/19/2019 5:07 PM

## 2019-07-19 NOTE — Progress Notes (Signed)
PROGRESS NOTE    Ann Booth  LNL:[id number] DOB: 1926-09-11 DOA: 07/18/2019 PCP: Biagio Borg, MD   Brief Narrative: 86 year old with past medical history significant for persistent A. 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 She presents complaining of left shoulder pain after a traumatic fall which she sustained 2 days prior to admission.  Patient also complaining of chronic shortness of breath, lower extremity edema.  In the ED she was found to be tachypneic respiration rate 23, tachycardia, hypotension.  Oxygen saturation 100% on 2 L.  T head and CT cervical spine show moderate ischemic vascular disease, chronic old T3 compression fracture.  CT chest showed moderate to large bilateral pleural effusion, possible loculated of pleural effusion on the left.  X-ray of the left clavicle show a minimally displaced acute fracture of the left distal clavicle.    Assessment & Plan:   Principal Problem:   Acute systolic (congestive) heart failure (HCC) Active Problems:   Hyperlipidemia   Anxiety state   Depression   HTN (hypertension)   Atrial fibrillation (HCC)   Ischemic cardiomyopathy   Acute pulmonary edema (HCC)   Acute respiratory failure with hypoxia (HCC)   Fracture of left clavicle   Fall at home, initial encounter   1-Acute on Chronic systolic heart failure exacerbation, acute respiratory failure with hypoxemia Acute pulmonary edema, valvular heart disease moderate to severe MR and TR.  Severe pulmonary hypertension -Continue with IV Lasix 40 mg IV twice daily.  -1.1 L.  Weight decreased from 1 40-8 22. -Systolic blood pressure soft, will hold Imdur and decrease metoprolol. -Cardiology has been consulted.  2-Bilateral pleural effusion: Small loculation on the left  side. Patient preferred trial of diuretics first.  We will plan to repeat chest x-ray tomorrow  3-CAD, ischemic cardiomyopathy with reduced ejection fraction 30 to 35% Continue with medical management aspirin, statin. Hold nitrates due to hypotension.  4-Persistent atrial fibrillation: We will decrease metoprolol due to hypotension Continue with Xarelto  5-Transaminases: might be related to liver congestion from HF.   6-Sustained mechanical fall, acute left clavicle fracture: Need to follow-up with orthopedic as an outpatient  Hypotension;  Could be related to medication metoprolol Imdur plus Heart failure, cardiogenic shock.  BP increases to 100 Will discontinue Imdur, decrease metoprolol.  Discussed with Nephew, patient wouldn't want aggressive intervention like IV pressors.  Palliative care consulted.   Hyperlipidemia: Continue with atorvastatin  Generalized anxiety disorder and major depressive disorder: Continue with Celexa  Cognitive impairment: Continue with Aricept  Diarrhea; check for C diff.  Hypokalemia; replete orally.   Estimated body mass index is 22.34 kg/m as calculated from the following:   Height as of this encounter: 5\' 2"  (1.575 m).   Weight as of this encounter: 55.4 kg.   DVT prophylaxis: Xarelto Code Status: DNR Family Communication: Care discussed with nephew., patient wouldn't want agressive intervention like central line/picc line for IV pressors. I have consulted palliative for goals of care.  Disposition Plan:  Status is: Inpatient  Remains inpatient appropriate because:IV treatments appropriate due to intensity of illness or inability to take PO   Dispo: The patient is from: SNF              Anticipated d/c is to:To be determine, SNF with hospice vs residential hospice.  Anticipated d/c date is: 2 days              Patient currently is not medically stable to d/c.     Consultants:    Cardiology  Palliative  Procedures:   none  Antimicrobials:  none  Subjective: She is sleepy, chronic ill appearing, frail. Denies worsening dyspnea or chest pain.   She develops diarrhea, multiples BM and was complaining of abdominal pain. BP drop to 80--- subsequently increase to 100.   Objective: Vitals:   07/19/19 0552 07/19/19 0745 07/19/19 0856 07/19/19 0900  BP: 115/76 (!) 113/91 107/81   Pulse: 83 79 (!) 107   Resp: 18 18    Temp:  (!) 97.5 F (36.4 C)    TempSrc:  Oral    SpO2: 100% (!) 83%  98%  Weight: 55.4 kg     Height:        Intake/Output Summary (Last 24 hours) at 07/19/2019 1105 Last data filed at 07/19/2019 0900 Gross per 24 hour  Intake --  Output 1478 ml  Net -1478 ml   Filed Weights   07/18/19 2229 07/19/19 0552  Weight: 58.2 kg 55.4 kg    Examination:  General exam: Frail, chronic ill appearing, letahrgic Respiratory system: Mild tachypnea, bilateral crackles.  Cardiovascular system: S1 & S2 heard, RRR. Positive JVD. Marland Kitchen Gastrointestinal system: Abdomen is nondistended, soft and nontender. No organomegaly or masses felt. Normal bowel sounds heard. Central nervous system: Lethargic Extremities: edema, cold.  Data Reviewed: I have personally reviewed following labs and imaging studies  CBC: Recent Labs  Lab 07/18/19 1615  WBC 6.2  NEUTROABS 3.7  HGB 14.1  HCT 44.6  MCV 92.7  PLT 125*   Basic Metabolic Panel: Recent Labs  Lab 07/18/19 1615 07/19/19 0502  NA 143 145  K 2.9* 3.4*  CL 100 103  CO2 31 32  GLUCOSE 87 89  BUN 19 16  CREATININE 1.18* 0.96  CALCIUM 8.6* 8.7*   GFR: Estimated Creatinine Clearance: 30.2 mL/min (by C-G formula based on SCr of 0.96 mg/dL). Liver Function Tests: Recent Labs  Lab 07/18/19 1615  AST 65*  ALT 62*  ALKPHOS 146*  BILITOT 1.3*  PROT 5.4*  ALBUMIN 2.3*   No results for input(s): LIPASE, AMYLASE in the last 168 hours. No results for input(s): AMMONIA in the last 168  hours. Coagulation Profile: No results for input(s): INR, PROTIME in the last 168 hours. Cardiac Enzymes: No results for input(s): CKTOTAL, CKMB, CKMBINDEX, TROPONINI in the last 168 hours. BNP (last 3 results) No results for input(s): PROBNP in the last 8760 hours. HbA1C: No results for input(s): HGBA1C in the last 72 hours. CBG: No results for input(s): GLUCAP in the last 168 hours. Lipid Profile: No results for input(s): CHOL, HDL, LDLCALC, TRIG, CHOLHDL, LDLDIRECT in the last 72 hours. Thyroid Function Tests: No results for input(s): TSH, T4TOTAL, FREET4, T3FREE, THYROIDAB in the last 72 hours. Anemia Panel: No results for input(s): VITAMINB12, FOLATE, FERRITIN, TIBC, IRON, RETICCTPCT in the last 72 hours. Sepsis Labs: No results for input(s): PROCALCITON, LATICACIDVEN in the last 168 hours.  Recent Results (from the past 240 hour(s))  SARS Coronavirus 2 by RT PCR (hospital order, performed in [facility] hospital lab) Nasopharyngeal Nasopharyngeal Swab     Status: None   Collection Time: 07/18/19  7:45 PM   Specimen: Nasopharyngeal Swab  Result Value Ref Range Status   SARS Coronavirus 2 NEGATIVE NEGATIVE Final    Comment: (NOTE) SARS-CoV-2 target nucleic  acids are NOT DETECTED. The SARS-CoV-2 RNA is generally detectable in upper and lower respiratory specimens during the acute phase of infection. The lowest concentration of SARS-CoV-2 viral copies this assay can detect is 250 copies / mL. A negative result does not preclude SARS-CoV-2 infection and should not be used as the sole basis for treatment or other patient management decisions.  A negative result may occur with improper specimen collection / handling, submission of specimen other than nasopharyngeal swab, presence of viral mutation(s) within the areas targeted by this assay, and inadequate number of viral copies (<250 copies / mL). A negative result must be combined with clinical observations, patient history, and  epidemiological information. Fact Sheet for Patients:   BoilerBrush.com.cy Fact Sheet for Healthcare Providers: https://pope.com/ This test is not yet approved or cleared  by the [place] FDA and has been authorized for detection and/or diagnosis of SARS-CoV-2 by FDA under an Emergency Use Authorization (EUA).  This EUA will remain in effect (meaning this test can be used) for the duration of the COVID-19 declaration under Section 564(b)(1) of the Act, 21 U.S.C. section 360bbb-3(b)(1), unless the authorization is terminated or revoked sooner. Performed at [facility], [address] [place], [place] [address]          Radiology Studies: DG Ribs Unilateral W/Chest Left  Result Date: 07/18/2019 CLINICAL DATA:  Larey Seat 1 transferring to a chair yesterday EXAM: LEFT RIBS AND CHEST - 3+ VIEW COMPARISON:  Chest radiograph 04/08/2019 FINDINGS: Enlargement of cardiac silhouette. Atherosclerotic calcification aorta. Small RIGHT pleural effusion and basilar atelectasis. Moderate LEFT pleural effusion with significant atelectasis of the lower LEFT lung. Peribronchial thickening and scattered interstitial changes again identified. No pneumothorax. Diffuse osseous demineralization. No definite LEFT rib fractures identified. Minimally displaced fracture of the distal LEFT clavicle is identified however. IMPRESSION: Moderate LEFT pleural effusion and significant atelectasis of the lower LEFT lung. Small RIGHT pleural effusion and mild RIGHT basilar atelectasis. Minimally displaced fracture distal LEFT clavicle. No definite rib fractures identified. Electronically Signed   By: Ulyses Southward M.D.   On: 07/18/2019 15:56   DG Clavicle Left  Result Date: 07/18/2019 CLINICAL DATA:  Larey Seat when transferring to a chair yesterday, dementia EXAM: LEFT CLAVICLE - 2+ VIEWS COMPARISON:  Chest radiograph 04/08/2019 FINDINGS: Osseous demineralization. AC joint  alignment normal. Clavicular head symmetric. Minimally displaced fracture of the distal LEFT clavicle. LEFT pleural effusion present. No additional fracture seen. IMPRESSION: Minimally displaced fracture of the distal LEFT clavicle. Electronically Signed   By: Ulyses Southward M.D.   On: 07/18/2019 15:58   CT Head Wo Contrast  Result Date: 07/18/2019 CLINICAL DATA:  Head trauma. Fall yesterday with clavicle fracture. EXAM: CT HEAD WITHOUT CONTRAST CT CERVICAL SPINE WITHOUT CONTRAST TECHNIQUE: Multidetector CT imaging of the head and cervical spine was performed following the standard protocol without intravenous contrast. Multiplanar CT image reconstructions of the cervical spine were also generated. COMPARISON:  Head CT 06/22/2018, head MRI 06/23/2018, and cervical spine CT 06/11/2018 FINDINGS: CT HEAD FINDINGS Brain: There is no evidence of acute infarct, intracranial hemorrhage, mass, midline shift, or extra-axial fluid collection. Cerebral atrophy is within normal limits for age. Hypodensities in the cerebral white matter bilaterally are unchanged and nonspecific but compatible with moderate chronic small vessel ischemic disease. Vascular: Calcified atherosclerosis at the skull base. No hyperdense vessel. Skull: No fracture or suspicious osseous lesion. Sinuses/Orbits: Visualized paranasal sinuses and mastoid air cells are clear. Bilateral cataract extraction. Other: None. CT CERVICAL SPINE FINDINGS The  study is mildly motion degraded. Alignment: Cervical spine straightening with grade 1 anterolisthesis of C3 on C4, C4 on C5, C5 on C6, and C7 on T1, likely degenerative. Skull base and vertebrae: No acute fracture is identified within limitations of motion artifact. No destructive osseous process. Unchanged chronic T3 compression fracture with severe anterior vertebral body height loss. Soft tissues and spinal canal: No prevertebral fluid or swelling. No visible canal hematoma. Disc levels: Moderate cervical disc  and facet degeneration, similar to the prior CT. Upper chest: Reported separately. Other: None. IMPRESSION: 1. No evidence of acute intracranial abnormality. 2. Moderate chronic small vessel ischemic disease. 3. No acute cervical spine fracture identified within limitations of motion artifact. 4. Chronic T3 compression fracture. Electronically Signed   By: Sebastian Ache M.D.   On: 07/18/2019 18:42   CT Chest W Contrast  Result Date: 07/18/2019 CLINICAL DATA:  86 year old female with history of chest pain and shortness of breath. History of trauma from a fall. EXAM: CT CHEST WITH CONTRAST TECHNIQUE: Multidetector CT imaging of the chest was performed during intravenous contrast administration. CONTRAST:  70mL OMNIPAQUE IOHEXOL 300 MG/ML  SOLN COMPARISON:  Chest CT 07/11/2010. FINDINGS: Cardiovascular: Heart size is enlarged with biatrial dilatation. There is no significant pericardial fluid, thickening or pericardial calcification. There is aortic atherosclerosis, as well as atherosclerosis of the great vessels of the mediastinum and the coronary arteries, including calcified atherosclerotic plaque in the left anterior descending and right coronary arteries. Thickening and calcification of the aortic valve. Mediastinum/Nodes: No pathologically enlarged mediastinal or hilar lymph nodes. Esophagus is unremarkable in appearance. Axillary lymphadenopathy. Lungs/Pleura: Moderate to large bilateral pleural effusions. The right pleural effusion layers dependently, where as the left pleural effusion appears partially loculated laterally. These are associated with areas of extensive passive atelectasis in the lower lobes of the lungs bilaterally. Patchy areas of ground-glass attenuation in the upper lungs bilaterally with some associated interlobular septal thickening, favored to reflect some mild pulmonary edema. No definite suspicious appearing pulmonary nodules or masses are noted. Upper Abdomen: Aortic atherosclerosis.  Musculoskeletal: Chronic appearing compression fracture of T3 with 80% loss of anterior vertebral body height and 30% loss of posterior vertebral body height. IMPRESSION: 1. Cardiomegaly with biatrial dilatation, evidence of mild pulmonary edema and moderate to large bilateral pleural effusions; imaging findings suggestive of congestive heart failure. 2. The left pleural effusion appears potentially partially loculated laterally, while the right pleural effusion is simple in appearance. 3. Aortic atherosclerosis, in addition to two vessel coronary artery disease. 4. There are calcifications of the aortic valve. Echocardiographic correlation for evaluation of potential valvular dysfunction may be warranted if clinically indicated. Aortic Atherosclerosis (ICD10-I70.0). Electronically Signed   By: Trudie Reed M.D.   On: 07/18/2019 18:47   CT Cervical Spine Wo Contrast  Result Date: 07/18/2019 CLINICAL DATA:  Head trauma. Fall yesterday with clavicle fracture. EXAM: CT HEAD WITHOUT CONTRAST CT CERVICAL SPINE WITHOUT CONTRAST TECHNIQUE: Multidetector CT imaging of the head and cervical spine was performed following the standard protocol without intravenous contrast. Multiplanar CT image reconstructions of the cervical spine were also generated. COMPARISON:  Head CT 06/22/2018, head MRI 06/23/2018, and cervical spine CT 06/11/2018 FINDINGS: CT HEAD FINDINGS Brain: There is no evidence of acute infarct, intracranial hemorrhage, mass, midline shift, or extra-axial fluid collection. Cerebral atrophy is within normal limits for age. Hypodensities in the cerebral white matter bilaterally are unchanged and nonspecific but compatible with moderate chronic small vessel ischemic disease. Vascular: Calcified atherosclerosis at the skull base.  No hyperdense vessel. Skull: No fracture or suspicious osseous lesion. Sinuses/Orbits: Visualized paranasal sinuses and mastoid air cells are clear. Bilateral cataract extraction.  Other: None. CT CERVICAL SPINE FINDINGS The study is mildly motion degraded. Alignment: Cervical spine straightening with grade 1 anterolisthesis of C3 on C4, C4 on C5, C5 on C6, and C7 on T1, likely degenerative. Skull base and vertebrae: No acute fracture is identified within limitations of motion artifact. No destructive osseous process. Unchanged chronic T3 compression fracture with severe anterior vertebral body height loss. Soft tissues and spinal canal: No prevertebral fluid or swelling. No visible canal hematoma. Disc levels: Moderate cervical disc and facet degeneration, similar to the prior CT. Upper chest: Reported separately. Other: None. IMPRESSION: 1. No evidence of acute intracranial abnormality. 2. Moderate chronic small vessel ischemic disease. 3. No acute cervical spine fracture identified within limitations of motion artifact. 4. Chronic T3 compression fracture. Electronically Signed   By: Sebastian Ache M.D.   On: 07/18/2019 18:42   DG Hip Unilat W or Wo Pelvis 2-3 Views Left  Result Date: 07/18/2019 CLINICAL DATA:  Larey Seat when transferring to a chair yesterday, tenderness and pain EXAM: DG HIP (WITH OR WITHOUT PELVIS) 2-3V LEFT COMPARISON:  None FINDINGS: Diffuse osseous demineralization. Joint spaces preserved. No acute fracture, dislocation, or bone destruction. Degenerative changes and scoliosis at visualized lumbar spine. Pelvic phleboliths noted. IMPRESSION: No acute osseous abnormalities. Electronically Signed   By: Ulyses Southward M.D.   On: 07/18/2019 15:53        Scheduled Meds:  aspirin  81 mg Oral Daily   atorvastatin  40 mg Oral Q2000   citalopram  10 mg Oral Daily   donepezil  10 mg Oral QHS   furosemide  40 mg Intravenous Q12H   lidocaine  1 patch Transdermal Q24H   metoprolol tartrate  12.5 mg Oral BID   Rivaroxaban  15 mg Oral Q2000   sodium chloride flush  3 mL Intravenous Q12H   timolol  1 drop Both Eyes BID   Continuous Infusions:  sodium chloride        LOS: 1 day    Time spent: 35 minutes.     Alba Cory, MD [facility]   If 7PM-7AM, please contact night-coverage [email or web address]  07/19/2019, 11:05 AM

## 2019-07-19 NOTE — Evaluation (Signed)
Physical Therapy Evaluation Patient Details Name: Ann Booth MRN: [id number] DOB: 12-06-1926 Today's Date: 07/19/2019   History of Present Illness  Ann Booth is a 86 y.o. female with medical history significant of persistent atrial fibrillation, chronic congestive heart failure with reduced ejection fraction (EF 30-35%), coronary artery disease status post acute anterior STEMI February 2021,  severe pulmonary hypertension, valvular heart disease with moderate MR, moderate to severe TR, hypertension, hyperlipidemia, generalized anxiety disorder who came to hospital for evaluation of mechanical fall.CT head and CT cervical spine, show moderate ischemic vascular disease, chronic old T3 compression fracture. CT chest showed moderate to large bilateral pleural effusion, with mild acute pleural edema, possible loculation of pleural effusion of the left.  X-ray of the left clavicle show a minimally displaced acute fracture of the left distal clavicle.  Clinical Impression  Pt admitted with above diagnosis. Pt was able to transfer with +2 mod assist to chair.  Pt apparently is transfers only at SNF prior to admit.  Recommendation is return to SNF.  Will follow acutely.  Pt currently with functional limitations due to the deficits listed below (see PT Problem List). Pt will benefit from skilled PT to increase their independence and safety with mobility to allow discharge to the venue listed below.    Follow Up Recommendations SNF;Supervision/Assistance - 24 hour    Equipment Recommendations  Other (comment)(TBA)    Recommendations for Other Services       Precautions / Restrictions Precautions Precautions: Fall Required Braces or Orthoses: Sling Restrictions Weight Bearing Restrictions: Yes LUE Weight Bearing: Non weight bearing      Mobility  Bed Mobility Overal bed mobility: Needs Assistance Bed Mobility: Supine to Sit     Supine to sit: Mod assist     General bed mobility  comments: Needs assist for LEs to EOB and for elevation of trunk  Transfers Overall transfer level: Needs assistance Equipment used: 2 person hand held assist Transfers: Sit to/from [employer] Sit to Stand: Mod assist;+2 physical assistance;From elevated surface   Squat pivot transfers: Mod assist;+2 physical assistance;From elevated surface     General transfer comment: Pt needs assist for transfers, squat pivot only  Ambulation/Gait             General Gait Details: does not walk at facility per chart  Stairs            Wheelchair Mobility    Modified Rankin (Stroke Patients Only)       Balance Overall balance assessment: Needs assistance Sitting-balance support: Bilateral upper extremity supported;Feet supported Sitting balance-Leahy Scale: Poor Sitting balance - Comments: relies on UE and external support   Standing balance support: Bilateral upper extremity supported;During functional activity Standing balance-Leahy Scale: Poor Standing balance comment: relies on UE and external support                             Pertinent Vitals/Pain Pain Assessment: Faces Faces Pain Scale: Hurts little more Pain Location: left shoulder Pain Descriptors / Indicators: Aching;Discomfort;Grimacing;Guarding Pain Intervention(s): Limited activity within patient's tolerance;Monitored during session;Repositioned    Home Living Family/patient expects to be discharged to:: [place]: Kasandra Knudsen - single point;Walker - 2 wheels Additional Comments: She was residing at [facility] and was transfers only per chart.     Prior Function Level of Independence: Needs assistance  Gait / Transfers Assistance Needed: transfers to wheelchair only per chart.   ADL's / Homemaking Assistance Needed: Assist by staff        Hand Dominance   Dominant Hand: Right    Extremity/Trunk Assessment   Upper Extremity  Assessment Upper Extremity Assessment: Defer to OT evaluation    Lower Extremity Assessment Lower Extremity Assessment: RLE deficits/detail;LLE deficits/detail RLE Deficits / Details: grossly 3/5 LLE Deficits / Details: grossly 3/5    Cervical / Trunk Assessment Cervical / Trunk Assessment: Normal  Communication   Communication: Other (comment)(soft spoken)  Cognition Arousal/Alertness: Awake/alert Behavior During Therapy: WFL for tasks assessed/performed Overall Cognitive Status: Within Functional Limits for tasks assessed                                        General Comments      Exercises General Exercises - Upper Extremity Shoulder Flexion: AROM;Right;5 reps;Seated Elbow Flexion: AROM;Right;5 reps;Seated General Exercises - Lower Extremity Ankle Circles/Pumps: AROM;Both;10 reps;Supine Long Arc Quad: AROM;Both;15 reps;Seated Hip Flexion/Marching: AROM;Both;15 reps;Seated   Assessment/Plan    PT Assessment Patient needs continued PT services  PT Problem List Decreased activity tolerance;Decreased balance;Decreased mobility;Decreased knowledge of use of DME;Decreased safety awareness       PT Treatment Interventions DME instruction;Functional mobility training;Therapeutic activities;Therapeutic exercise;Balance training;Patient/family education;Wheelchair mobility training    PT Goals (Current goals can be found in the Care Plan section)  Acute Rehab PT Goals Patient Stated Goal: to get better PT Goal Formulation: With patient Time For Goal Achievement: 08/02/19 Potential to Achieve Goals: Good    Frequency Min 2X/week   Barriers to discharge Decreased caregiver support      Co-evaluation               AM-PAC PT "6 Clicks" Mobility  Outcome Measure Help needed turning from your back to your side while in a flat bed without using bedrails?: A Lot Help needed moving from lying on your back to sitting on the side of a flat bed without  using bedrails?: A Lot Help needed moving to and from a bed to a chair (including a wheelchair)?: Total Help needed standing up from a chair using your arms (e.g., wheelchair or bedside chair)?: Total Help needed to walk in hospital room?: Total Help needed climbing 3-5 steps with a railing? : Total 6 Click Score: 8    End of Session Equipment Utilized During Treatment: Gait belt;Oxygen(2LO2) Activity Tolerance: Patient limited by fatigue Patient left: in chair;with call bell/phone within reach;with chair alarm set Nurse Communication: Mobility status PT Visit Diagnosis: Unsteadiness on feet (R26.81);Muscle weakness (generalized) (M62.81)    Time: 9937-1696 PT Time Calculation (min) (ACUTE ONLY): 12 min   Charges:   PT Evaluation $PT Eval Moderate Complexity: 1 Mod          Zuleima Haser W,PT Acute Rehabilitation Services Pager:  [phone]  Office:  [phone]    [name] 07/19/2019, 11:03 AM

## 2019-07-19 NOTE — Progress Notes (Signed)
Patient ID: Ann Booth, female   DOB: 02-10-1927, 86 y.o.   MRN: [id number]  Palliative-    Thank you for this consult. Chart reviewed.  Called patient's nephew and HCPOA Ann Booth in efforts to arrange family meeting. He is currently on his way to [facility] for the next 5-6 days. Plan for GOC meeting via telephone tomorrow morning.   Ann Foot, NP-C Palliative Medicine   Please call Palliative Medicine team phone with any questions [phone]. For individual providers please see AMION.   No charge   This NP Ann Booth agrees with above assessment and plan

## 2019-07-19 NOTE — Progress Notes (Signed)
CDiff. MD consulted regarding Cdiff PCR orders. Based on patients presenting symptoms and clinical factors we will only send a sample for Cdiff PCR if there is an appropriate sample before 7pm today. Otherwise we will defer to a GI panel.

## 2019-07-19 NOTE — Discharge Instructions (Signed)

## 2019-07-19 NOTE — Significant Event (Signed)
Rapid Response Event Note  Overview: Time Called: 1116 Arrival Time: 1119 Event Type: Other (Comment)(Second set of eyes)  Initial Focused Assessment: Pt lying in bed, alert, oriented. Pt is able to follow commands. Pt appears drowsy and endorses feeling fatigued. Lung sounds are clear. JVD noted, HOB at 20 degrees. Skin is pale. Extremities are cold to touch, pulses are palpable 1+. Toes are dusky. Pt endorses pain in her stomach, per primary RN pt has been having loose stools this morning. Grips equal.   VS: 97.94F oral, BP 86/69, HR 75, RR 16, SpO2 97% on room air  Interventions: -EKG: Atrial fibrillation, HR 73 - reviewed by Dr. Sunnie Nielsen -CBG: 106  -Cardiology consulted by Dr. Sunnie Nielsen  Event Summary: Name of Physician Notified: Dr. Sunnie Nielsen at 1116 Outcome: Stayed in room and stabalized Event End Time: 1130  Jennye Moccasin

## 2019-07-20 DIAGNOSIS — R531 Weakness: Secondary | ICD-10-CM

## 2019-07-20 DIAGNOSIS — M79602 Pain in left arm: Secondary | ICD-10-CM

## 2019-07-20 DIAGNOSIS — Z66 Do not resuscitate: Secondary | ICD-10-CM

## 2019-07-20 DIAGNOSIS — J9601 Acute respiratory failure with hypoxia: Secondary | ICD-10-CM

## 2019-07-20 LAB — CBC
HCT: 47.8 % — ABNORMAL HIGH (ref 36.0–46.0)
Hemoglobin: 15.1 g/dL — ABNORMAL HIGH (ref 12.0–15.0)
MCH: 29.3 pg (ref 26.0–34.0)
MCHC: 31.6 g/dL (ref 30.0–36.0)
MCV: 92.8 fL (ref 80.0–100.0)
Platelets: 128 10*3/uL — ABNORMAL LOW (ref 150–400)
RBC: 5.15 MIL/uL — ABNORMAL HIGH (ref 3.87–5.11)
RDW: 18.5 % — ABNORMAL HIGH (ref 11.5–15.5)
WBC: 6 10*3/uL (ref 4.0–10.5)
nRBC: 0 % (ref 0.0–0.2)

## 2019-07-20 LAB — BASIC METABOLIC PANEL
Anion gap: 9 (ref 5–15)
BUN: 16 mg/dL (ref 8–23)
CO2: 32 mmol/L (ref 22–32)
Calcium: 8.9 mg/dL (ref 8.9–10.3)
Chloride: 102 mmol/L (ref 98–111)
Creatinine, Ser: 0.98 mg/dL (ref 0.44–1.00)
GFR calc Af Amer: 58 mL/min — ABNORMAL LOW (ref >60)
GFR calc non Af Amer: 50 mL/min — ABNORMAL LOW (ref >60)
Glucose, Bld: 96 mg/dL (ref 70–99)
Potassium: 4.2 mmol/L (ref 3.5–5.1)
Sodium: 143 mmol/L (ref 135–145)

## 2019-07-20 LAB — VITAMIN B12: Vitamin B-12: 1109 pg/mL — ABNORMAL HIGH (ref 180–914)

## 2019-07-20 LAB — TSH: TSH: 0.893 u[IU]/mL (ref 0.350–4.500)

## 2019-07-20 MED ORDER — METOPROLOL SUCCINATE ER 25 MG PO TB24
25.0000 mg | ORAL_TABLET | Freq: Every day | ORAL | Status: DC
  Administered 2019-07-20: 25 mg via ORAL
  Filled 2019-07-20 (×2): qty 1

## 2019-07-20 NOTE — TOC Initial Note (Signed)
Transition of Care [facility]) - Initial/Assessment Note    Patient Details  Name: Ann Booth MRN: [id number] Date of Birth: Dec 15, 1926  Transition of Care [facility]) CM/SW Contact:    Jimmy Picket, [place] Phone Number: 07/20/2019, 3:37 PM  Clinical Narrative:                  CSW spoke via phone to pts nephew Ray. Ray stated that pt is a long term resident at [facility] and has been there about a month and a half. Ray stated that pt is a self pay pt. Prior to geeting a LTC bed pt was living in her home.   Rays plan for the pt is to return to [facility].   CSW will continue to follow.   Expected Discharge Plan: Long Term Nursing Home Barriers to Discharge: Continued Medical Work up   Patient Goals and CMS Choice Patient states their goals for this hospitalization and ongoing recovery are:: Pt was unable to express goals. CMS Medicare.gov Compare Post Acute Care list provided to:: Patient Represenative (must comment)(Ray- nephew) Choice offered to / list presented to : [facility] POA / Guardian  Expected Discharge Plan and Services Expected Discharge Plan: Long Term Nursing Home       Living arrangements for the past 2 months: Skilled Nursing Facility([facility])                                      Prior Living Arrangements/Services Living arrangements for the past 2 months: Skilled Nursing Facility([facility]) Lives with:: Facility Resident Patient language and need for interpreter reviewed:: Yes Do you feel safe going back to the place where you live?: Yes      Need for Family Participation in Patient Care: Yes (Comment) Care giver support system in place?: Yes (comment)   Criminal Activity/Legal Involvement Pertinent to Current Situation/Hospitalization: No - Comment as needed  Activities of Daily Living      Permission Sought/Granted   Permission granted to share information with : Yes, Verbal Permission Granted     Permission granted to share info w  AGENCY: SNF        Emotional Assessment Appearance:: Appears stated age Attitude/Demeanor/Rapport: Unable to Assess Affect (typically observed): Unable to Assess Orientation: : Oriented to Self, Oriented to Place Alcohol / Substance Use: Not Applicable Psych Involvement: No (comment)  Admission diagnosis:  Acute systolic (congestive) heart failure (HCC) [I50.21] Patient Active Problem List   Diagnosis Date Noted  . Acute systolic (congestive) heart failure (HCC) 07/18/2019  . Acute pulmonary edema (HCC) 07/18/2019  . Acute respiratory failure with hypoxia (HCC) 07/18/2019  . Fracture of left clavicle 07/18/2019  . Fall at home, initial encounter 07/18/2019  . Ischemic cardiomyopathy 04/19/2019  . Goals of care, counseling/discussion   . Palliative care by specialist   . NSTEMI (non-ST elevated myocardial infarction) (HCC) 04/08/2019  . Memory changes 03/22/2019  . Delusional thoughts (HCC) 03/22/2019  . Atrial fibrillation with RVR (HCC) 07/02/2018  . Recurrent falls while walking 06/17/2018  . Urinary incontinence 06/17/2018  . Thoracic spine pain 04/05/2018  . Hyperglycemia 04/05/2018  . Falls frequently 03/27/2018  . Other fatigue 03/10/2018  . Degenerative arthritis of left knee 06/25/2017  . Peripheral vascular disease (HCC) 05/21/2017  . Rib pain 04/24/2017  . Chest pain at rest 03/16/2017  . Callus of foot 05/26/2016  . Right foot pain 03/21/2016  . Allergic conjunctivitis  03/21/2016  . Insomnia 07/28/2013  . Arthritis of sacroiliac joint 03/30/2013  . Leg length discrepancy 03/30/2013  . N&V (nausea and vomiting) 01/27/2013  . Atrial fibrillation ([place]) 11/01/2012  . Conjunctivitis 07/21/2012  . Carotid stenosis 04/27/2012  . Chronic lower back pain 04/27/2012  . Idiopathic scoliosis 04/27/2012  . Black stools 10/25/2011  . HTN (hypertension) 08/23/2010  . Preventative health care 07/25/2010  . Peripheral edema 07/25/2010  . Hypercalcemia 11/09/2008  .  POSITIONAL VERTIGO 11/09/2008  . BACK PAIN 11/09/2008  . Peripheral neuropathic pain 06/01/2007  . Allergic rhinitis 06/01/2007  . SHOULDER PAIN, BILATERAL 06/01/2007  . FATIGUE 06/01/2007  . Hyperlipidemia 10/18/2006  . Anxiety state 10/18/2006  . Depression 10/18/2006  . Unspecified glaucoma 10/15/2006  . CATARACT NOS 10/15/2006  . Mitral valve disorder 10/15/2006  . Asthma 10/15/2006  . Rosacea 10/15/2006  . Osteoporosis 10/15/2006   PCP:  Biagio Borg, MD Pharmacy:   [facility] [place] [place] - [address] [place] ([place]) [place] [address] Phone: [phone] Fax: [phone]    Social Determinants of Health (SDOH) Interventions    Readmission Risk Interventions No flowsheet data found.  Emeterio Reeve, Latanya Presser, [place] Social Worker [phone]

## 2019-07-20 NOTE — Progress Notes (Signed)
PROGRESS NOTE    Ann Booth  WKG:[id number] DOB: 04/02/1926 DOA: 07/18/2019 PCP: Corwin Levins, MD   Brief Narrative: 86/F from SNF with past medical history significant for persistent A. 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  Patient also complaining of chronic shortness of breath, lower extremity edema.  In the ED she was found to be tachypneic, tachycardic, Oxygen saturation 100% on 2 L.  T head and CT cervical spine show moderate ischemic vascular disease, chronic old T3 compression fracture.  CT chest showed moderate Left pleural effusion, X-ray of the left clavicle show a minimally displaced acute fracture of the left distal clavicle.    Assessment & Plan:   Acute on Chronic systolic heart failure exacerbation,  Acute respiratory failure with hypoxemia Valvular heart disease moderate to severe MR and TR.   Severe pulmonary hypertension Moderate Left pleural effusion -Continue with IV Lasix 40 mg IV twice daily.   -Cardiology input appreciated -Continue metoprolol, also started on midodrine  Moderate left pleural effusion:  Small right pleural effusion -Cannot rule out small loculation on the left side. -Continue diuretics, repeat chest x-ray tomorrow  CAD, ischemic cardiomyopathy with reduced ejection fraction 30 to 35% Continue with medical management aspirin, statin. Hold nitrates due to hypotension.  Persistent atrial fibrillation: - decrease metoprolol due to hypotension Continue with Xarelto  Fall, acute left clavicle fracture -Discussed with orthopedics, recommended to continue sling and follow-up with Dr. August Saucer in 2  weeks  Hyperlipidemia: Continue with atorvastatin  Generalized anxiety disorder and major depressive disorder: Continue with Celexa  Cognitive impairment Mild dementia - Continue with Aricept  Hypokalemia; replete orally.   Estimated body mass index is 22.13 kg/m as calculated from the following:   Height as of this encounter: 5\' 2"  (1.575 m).   Weight as of this encounter: 54.9 kg.   Elderly chronically ill nursing home resident with extensive list of cardiopulmonary problems -May benefit from hospice services at SNF -Palliative care was consulted by my partner yesterday  DVT prophylaxis: Xarelto Code Status: DNR Family Communication: No family at bedside, Dr.Regalado discussed with patient's nephew yesterday Disposition Plan:  Status is: Inpatient  Remains inpatient appropriate because:IV treatments appropriate due to intensity of illness or inability to take PO   Dispo: The patient is from: SNF              Anticipated d/c is to:To be determine, SNF with hospice?              Anticipated d/c date is: 2 days              Patient currently is not medically stable to d/c.     Consultants:   Cardiology  Palliative  Procedures:   none  Antimicrobials:  none  Subjective: -Pleasantly confused, laying in bed, discomfort in left shoulder,  Objective: Vitals:   07/19/19 1816 07/19/19 1951 07/20/19 0054 07/20/19 0541  BP:  111/84  117/89  Pulse: (!) 101 87  93  Resp:  15  16  Temp:  98.2 F (36.8 C)  (!) 97.5 F (36.4 C)  TempSrc:  Oral  Oral  SpO2:  100%  98%  Weight:   54.9 kg   Height:  Intake/Output Summary (Last 24 hours) at 07/20/2019 1117 Last data filed at 07/20/2019 0841 Gross per 24 hour  Intake 600 ml  Output 2302 ml  Net -1702 ml   Filed Weights   07/18/19 2229 07/19/19 0552 07/20/19 0054  Weight: 58.2 kg 55.4 kg 54.9 kg    Examination:  General exam: Elderly frail chronically ill female, laying in bed, awake alert oriented  to self only, pleasantly confused Respiratory system: Diminished breath sounds at both bases, left greater than right Cardiovascular system: S1 & S2 heard, RRR . Gastrointestinal system: Abdomen is nondistended, soft and nontender. No organomegaly or masses felt. Normal bowel sounds heard. Central nervous system: Moves all extremities, left arm in sling Extremities: 1+ edema, left arm in sling  Data Reviewed: I have personally reviewed following labs and imaging studies  CBC: Recent Labs  Lab 07/18/19 1615 07/20/19 0634  WBC 6.2 6.0  NEUTROABS 3.7  --   HGB 14.1 15.1*  HCT 44.6 47.8*  MCV 92.7 92.8  PLT 125* 128*   Basic Metabolic Panel: Recent Labs  Lab 07/18/19 1615 07/19/19 0502 07/20/19 0634  NA 143 145 143  K 2.9* 3.4* 4.2  CL 100 103 102  CO2 31 32 32  GLUCOSE 87 89 96  BUN 19 16 16   CREATININE 1.18* 0.96 0.98  CALCIUM 8.6* 8.7* 8.9   GFR: Estimated Creatinine Clearance: 29.6 mL/min (by C-G formula based on SCr of 0.98 mg/dL). Liver Function Tests: Recent Labs  Lab 07/18/19 1615  AST 65*  ALT 62*  ALKPHOS 146*  BILITOT 1.3*  PROT 5.4*  ALBUMIN 2.3*   No results for input(s): LIPASE, AMYLASE in the last 168 hours. No results for input(s): AMMONIA in the last 168 hours. Coagulation Profile: No results for input(s): INR, PROTIME in the last 168 hours. Cardiac Enzymes: No results for input(s): CKTOTAL, CKMB, CKMBINDEX, TROPONINI in the last 168 hours. BNP (last 3 results) No results for input(s): PROBNP in the last 8760 hours. HbA1C: No results for input(s): HGBA1C in the last 72 hours. CBG: Recent Labs  Lab 07/19/19 1205  GLUCAP 106*   Lipid Profile: No results for input(s): CHOL, HDL, LDLCALC, TRIG, CHOLHDL, LDLDIRECT in the last 72 hours. Thyroid Function Tests: Recent Labs    07/20/19 0634  TSH 0.893   Anemia Panel: Recent Labs    07/20/19 0634  VITAMINB12 1,109*   Sepsis Labs: No results for input(s): PROCALCITON, LATICACIDVEN in  the last 168 hours.  Recent Results (from the past 240 hour(s))  SARS Coronavirus 2 by RT PCR (hospital order, performed in [facility] hospital lab) Nasopharyngeal Nasopharyngeal Swab     Status: None   Collection Time: 07/18/19  7:45 PM   Specimen: Nasopharyngeal Swab  Result Value Ref Range Status   SARS Coronavirus 2 NEGATIVE NEGATIVE Final    Comment: (NOTE) SARS-CoV-2 target nucleic acids are NOT DETECTED. The SARS-CoV-2 RNA is generally detectable in upper and lower respiratory specimens during the acute phase of infection. The lowest concentration of SARS-CoV-2 viral copies this assay can detect is 250 copies / mL. A negative result does not preclude SARS-CoV-2 infection and should not be used as the sole basis for treatment or other patient management decisions.  A negative result may occur with improper specimen collection / handling, submission of specimen other than nasopharyngeal swab, presence of viral mutation(s) within the areas targeted by this assay, and inadequate number of viral copies (<250 copies / mL). A negative result must be combined with clinical  observations, patient history, and epidemiological information. Fact Sheet for Patients:   BoilerBrush.com.cy Fact Sheet for Healthcare Providers: https://pope.com/ This test is not yet approved or cleared  by the [place] FDA and has been authorized for detection and/or diagnosis of SARS-CoV-2 by FDA under an Emergency Use Authorization (EUA).  This EUA will remain in effect (meaning this test can be used) for the duration of the COVID-19 declaration under Section 564(b)(1) of the Act, 21 U.S.C. section 360bbb-3(b)(1), unless the authorization is terminated or revoked sooner. Performed at [facility], [address] [place], [place] [address]          Radiology Studies: DG Ribs Unilateral W/Chest Left  Result Date: 07/18/2019 CLINICAL DATA:   Larey Seat 1 transferring to a chair yesterday EXAM: LEFT RIBS AND CHEST - 3+ VIEW COMPARISON:  Chest radiograph 04/08/2019 FINDINGS: Enlargement of cardiac silhouette. Atherosclerotic calcification aorta. Small RIGHT pleural effusion and basilar atelectasis. Moderate LEFT pleural effusion with significant atelectasis of the lower LEFT lung. Peribronchial thickening and scattered interstitial changes again identified. No pneumothorax. Diffuse osseous demineralization. No definite LEFT rib fractures identified. Minimally displaced fracture of the distal LEFT clavicle is identified however. IMPRESSION: Moderate LEFT pleural effusion and significant atelectasis of the lower LEFT lung. Small RIGHT pleural effusion and mild RIGHT basilar atelectasis. Minimally displaced fracture distal LEFT clavicle. No definite rib fractures identified. Electronically Signed   By: Ulyses Southward M.D.   On: 07/18/2019 15:56   DG Clavicle Left  Result Date: 07/18/2019 CLINICAL DATA:  Larey Seat when transferring to a chair yesterday, dementia EXAM: LEFT CLAVICLE - 2+ VIEWS COMPARISON:  Chest radiograph 04/08/2019 FINDINGS: Osseous demineralization. AC joint alignment normal. Clavicular head symmetric. Minimally displaced fracture of the distal LEFT clavicle. LEFT pleural effusion present. No additional fracture seen. IMPRESSION: Minimally displaced fracture of the distal LEFT clavicle. Electronically Signed   By: Ulyses Southward M.D.   On: 07/18/2019 15:58   CT Head Wo Contrast  Result Date: 07/18/2019 CLINICAL DATA:  Head trauma. Fall yesterday with clavicle fracture. EXAM: CT HEAD WITHOUT CONTRAST CT CERVICAL SPINE WITHOUT CONTRAST TECHNIQUE: Multidetector CT imaging of the head and cervical spine was performed following the standard protocol without intravenous contrast. Multiplanar CT image reconstructions of the cervical spine were also generated. COMPARISON:  Head CT 06/22/2018, head MRI 06/23/2018, and cervical spine CT 06/11/2018 FINDINGS:  CT HEAD FINDINGS Brain: There is no evidence of acute infarct, intracranial hemorrhage, mass, midline shift, or extra-axial fluid collection. Cerebral atrophy is within normal limits for age. Hypodensities in the cerebral white matter bilaterally are unchanged and nonspecific but compatible with moderate chronic small vessel ischemic disease. Vascular: Calcified atherosclerosis at the skull base. No hyperdense vessel. Skull: No fracture or suspicious osseous lesion. Sinuses/Orbits: Visualized paranasal sinuses and mastoid air cells are clear. Bilateral cataract extraction. Other: None. CT CERVICAL SPINE FINDINGS The study is mildly motion degraded. Alignment: Cervical spine straightening with grade 1 anterolisthesis of C3 on C4, C4 on C5, C5 on C6, and C7 on T1, likely degenerative. Skull base and vertebrae: No acute fracture is identified within limitations of motion artifact. No destructive osseous process. Unchanged chronic T3 compression fracture with severe anterior vertebral body height loss. Soft tissues and spinal canal: No prevertebral fluid or swelling. No visible canal hematoma. Disc levels: Moderate cervical disc and facet degeneration, similar to the prior CT. Upper chest: Reported separately. Other: None. IMPRESSION: 1. No evidence of acute intracranial abnormality. 2. Moderate chronic small vessel ischemic disease. 3. No acute  cervical spine fracture identified within limitations of motion artifact. 4. Chronic T3 compression fracture. Electronically Signed   By: Sebastian Ache M.D.   On: 07/18/2019 18:42   CT Chest W Contrast  Result Date: 07/18/2019 CLINICAL DATA:  86 year old female with history of chest pain and shortness of breath. History of trauma from a fall. EXAM: CT CHEST WITH CONTRAST TECHNIQUE: Multidetector CT imaging of the chest was performed during intravenous contrast administration. CONTRAST:  66mL OMNIPAQUE IOHEXOL 300 MG/ML  SOLN COMPARISON:  Chest CT 07/11/2010. FINDINGS:  Cardiovascular: Heart size is enlarged with biatrial dilatation. There is no significant pericardial fluid, thickening or pericardial calcification. There is aortic atherosclerosis, as well as atherosclerosis of the great vessels of the mediastinum and the coronary arteries, including calcified atherosclerotic plaque in the left anterior descending and right coronary arteries. Thickening and calcification of the aortic valve. Mediastinum/Nodes: No pathologically enlarged mediastinal or hilar lymph nodes. Esophagus is unremarkable in appearance. Axillary lymphadenopathy. Lungs/Pleura: Moderate to large bilateral pleural effusions. The right pleural effusion layers dependently, where as the left pleural effusion appears partially loculated laterally. These are associated with areas of extensive passive atelectasis in the lower lobes of the lungs bilaterally. Patchy areas of ground-glass attenuation in the upper lungs bilaterally with some associated interlobular septal thickening, favored to reflect some mild pulmonary edema. No definite suspicious appearing pulmonary nodules or masses are noted. Upper Abdomen: Aortic atherosclerosis. Musculoskeletal: Chronic appearing compression fracture of T3 with 80% loss of anterior vertebral body height and 30% loss of posterior vertebral body height. IMPRESSION: 1. Cardiomegaly with biatrial dilatation, evidence of mild pulmonary edema and moderate to large bilateral pleural effusions; imaging findings suggestive of congestive heart failure. 2. The left pleural effusion appears potentially partially loculated laterally, while the right pleural effusion is simple in appearance. 3. Aortic atherosclerosis, in addition to two vessel coronary artery disease. 4. There are calcifications of the aortic valve. Echocardiographic correlation for evaluation of potential valvular dysfunction may be warranted if clinically indicated. Aortic Atherosclerosis (ICD10-I70.0). Electronically  Signed   By: Trudie Reed M.D.   On: 07/18/2019 18:47   CT Cervical Spine Wo Contrast  Result Date: 07/18/2019 CLINICAL DATA:  Head trauma. Fall yesterday with clavicle fracture. EXAM: CT HEAD WITHOUT CONTRAST CT CERVICAL SPINE WITHOUT CONTRAST TECHNIQUE: Multidetector CT imaging of the head and cervical spine was performed following the standard protocol without intravenous contrast. Multiplanar CT image reconstructions of the cervical spine were also generated. COMPARISON:  Head CT 06/22/2018, head MRI 06/23/2018, and cervical spine CT 06/11/2018 FINDINGS: CT HEAD FINDINGS Brain: There is no evidence of acute infarct, intracranial hemorrhage, mass, midline shift, or extra-axial fluid collection. Cerebral atrophy is within normal limits for age. Hypodensities in the cerebral white matter bilaterally are unchanged and nonspecific but compatible with moderate chronic small vessel ischemic disease. Vascular: Calcified atherosclerosis at the skull base. No hyperdense vessel. Skull: No fracture or suspicious osseous lesion. Sinuses/Orbits: Visualized paranasal sinuses and mastoid air cells are clear. Bilateral cataract extraction. Other: None. CT CERVICAL SPINE FINDINGS The study is mildly motion degraded. Alignment: Cervical spine straightening with grade 1 anterolisthesis of C3 on C4, C4 on C5, C5 on C6, and C7 on T1, likely degenerative. Skull base and vertebrae: No acute fracture is identified within limitations of motion artifact. No destructive osseous process. Unchanged chronic T3 compression fracture with severe anterior vertebral body height loss. Soft tissues and spinal canal: No prevertebral fluid or swelling. No visible canal hematoma. Disc levels: Moderate cervical disc and facet  degeneration, similar to the prior CT. Upper chest: Reported separately. Other: None. IMPRESSION: 1. No evidence of acute intracranial abnormality. 2. Moderate chronic small vessel ischemic disease. 3. No acute cervical  spine fracture identified within limitations of motion artifact. 4. Chronic T3 compression fracture. Electronically Signed   By: Logan Bores M.D.   On: 07/18/2019 18:42   DG Hip Unilat W or Wo Pelvis 2-3 Views Left  Result Date: 07/18/2019 CLINICAL DATA:  [place] [address] when transferring to a chair yesterday, tenderness and pain EXAM: DG HIP (WITH OR WITHOUT PELVIS) 2-3V LEFT COMPARISON:  None FINDINGS: Diffuse osseous demineralization. Joint spaces preserved. No acute fracture, dislocation, or bone destruction. Degenerative changes and scoliosis at visualized lumbar spine. Pelvic phleboliths noted. IMPRESSION: No acute osseous abnormalities. Electronically Signed   By: Lavonia Dana M.D.   On: 07/18/2019 15:53        Scheduled Meds: . aspirin  81 mg Oral Daily  . atorvastatin  40 mg Oral Q2000  . citalopram  10 mg Oral Daily  . digoxin  0.125 mg Oral Daily  . donepezil  10 mg Oral QHS  . furosemide  40 mg Intravenous Q12H  . lidocaine  1 patch Transdermal Q24H  . metoprolol succinate  25 mg Oral Daily  . midodrine  5 mg Oral TID WC  . Rivaroxaban  15 mg Oral Q2000  . sodium chloride flush  3 mL Intravenous Q12H  . timolol  1 drop Both Eyes BID   Continuous Infusions: . sodium chloride       LOS: 2 days    Time spent: 35 minutes.     Domenic Polite, MD [facility]   07/20/2019, 11:17 AM

## 2019-07-20 NOTE — Plan of Care (Signed)

## 2019-07-20 NOTE — [place] FL2 (Signed)
[place] MEDICAID FL2 LEVEL OF CARE SCREENING TOOL     IDENTIFICATION  Patient Name: Ann Booth Birthdate: [birth year] Sex: female Admission Date (Current Location): 07/18/2019  [facility] and [place] Number:  Producer, television/film/video and Address:  [facility], [address], [place], [place] [address]      Provider Number: [id number]  Attending Physician Name and Address:  Zannie Cove, MD  Relative Name and Phone Number:       Current Level of Care: Hospital Recommended Level of Care: Skilled Nursing Facility Prior Approval Number:    Date Approved/Denied:   PASRR Number: [id number]  Discharge Plan: SNF    Current Diagnoses: Patient Active Problem List   Diagnosis Date Noted  . Acute systolic (congestive) heart failure (HCC) 07/18/2019  . Acute pulmonary edema (HCC) 07/18/2019  . Acute respiratory failure with hypoxia (HCC) 07/18/2019  . Fracture of left clavicle 07/18/2019  . Fall at home, initial encounter 07/18/2019  . Ischemic cardiomyopathy 04/19/2019  . Goals of care, counseling/discussion   . Palliative care by specialist   . NSTEMI (non-ST elevated myocardial infarction) (HCC) 04/08/2019  . Memory changes 03/22/2019  . Delusional thoughts (HCC) 03/22/2019  . Atrial fibrillation with RVR (HCC) 07/02/2018  . Recurrent falls while walking 06/17/2018  . Urinary incontinence 06/17/2018  . Thoracic spine pain 04/05/2018  . Hyperglycemia 04/05/2018  . Falls frequently 03/27/2018  . Other fatigue 03/10/2018  . Degenerative arthritis of left knee 06/25/2017  . Peripheral vascular disease (HCC) 05/21/2017  . Rib pain 04/24/2017  . Chest pain at rest 03/16/2017  . Callus of foot 05/26/2016  . Right foot pain 03/21/2016  . Allergic conjunctivitis 03/21/2016  . Insomnia 07/28/2013  . Arthritis of sacroiliac joint 03/30/2013  . Leg length discrepancy 03/30/2013  . N&V (nausea and vomiting) 01/27/2013  . Atrial fibrillation (HCC)  11/01/2012  . Conjunctivitis 07/21/2012  . Carotid stenosis 04/27/2012  . Chronic lower back pain 04/27/2012  . Idiopathic scoliosis 04/27/2012  . Black stools 10/25/2011  . HTN (hypertension) 08/23/2010  . Preventative health care 07/25/2010  . Peripheral edema 07/25/2010  . Hypercalcemia 11/09/2008  . POSITIONAL VERTIGO 11/09/2008  . BACK PAIN 11/09/2008  . Peripheral neuropathic pain 06/01/2007  . Allergic rhinitis 06/01/2007  . SHOULDER PAIN, BILATERAL 06/01/2007  . FATIGUE 06/01/2007  . Hyperlipidemia 10/18/2006  . Anxiety state 10/18/2006  . Depression 10/18/2006  . Unspecified glaucoma 10/15/2006  . CATARACT NOS 10/15/2006  . Mitral valve disorder 10/15/2006  . Asthma 10/15/2006  . Rosacea 10/15/2006  . Osteoporosis 10/15/2006    Orientation RESPIRATION BLADDER Height & Weight     Self, Place  Normal Continent Weight: 121 lb (54.9 kg) Height:  5\' 2"  (157.5 cm)  BEHAVIORAL SYMPTOMS/MOOD NEUROLOGICAL BOWEL NUTRITION STATUS      Continent Diet(See discharge summary)  AMBULATORY STATUS COMMUNICATION OF NEEDS Skin   Total Care Verbally Normal                       Personal Care Assistance Level of Assistance  Feeding, Dressing, Bathing Bathing Assistance: Maximum assistance Feeding assistance: Limited assistance Dressing Assistance: Maximum assistance     Functional Limitations Info  Sight, Hearing, Speech Sight Info: Adequate Hearing Info: Adequate Speech Info: Adequate    SPECIAL CARE FACTORS FREQUENCY  PT (By licensed PT), OT (By licensed OT)     PT Frequency: 5x a week OT Frequency: 5x a week  Contractures Contractures Info: Not present    Additional Factors Info  Code Status, Allergies Code Status Info: DNR Allergies Info: Cymbalta           Current Medications (07/20/2019):  This is the current hospital active medication list Current Facility-Administered Medications  Medication Dose Route Frequency Provider Last Rate  Last Admin  . 0.9 %  sodium chloride infusion  250 mL Intravenous PRN Poplawski, Rafal, MD      . acetaminophen (TYLENOL) tablet 650 mg  650 mg Oral Q4H PRN Poplawski, Rafal, MD      . aspirin chewable tablet 81 mg  81 mg Oral Daily Poplawski, Rafal, MD   81 mg at 07/20/19 1123  . atorvastatin (LIPITOR) tablet 40 mg  40 mg Oral Q2000 Poplawski, Rafal, MD   40 mg at 07/19/19 2034  . citalopram (CELEXA) tablet 10 mg  10 mg Oral Daily Poplawski, Rafal, MD   10 mg at 07/20/19 1122  . digoxin (LANOXIN) tablet 0.125 mg  0.125 mg Oral Daily Skeet Latch, MD   0.125 mg at 07/20/19 1123  . donepezil (ARICEPT) tablet 10 mg  10 mg Oral QHS Poplawski, Rafal, MD   10 mg at 07/19/19 2321  . furosemide (LASIX) injection 40 mg  40 mg Intravenous Q12H Poplawski, Rafal, MD   40 mg at 07/20/19 1124  . lidocaine (LIDODERM) 5 % 1 patch  1 patch Transdermal Q24H Poplawski, Rafal, MD   1 patch at 07/19/19 2321  . metoprolol succinate (TOPROL-XL) 24 hr tablet 25 mg  25 mg Oral Daily Skeet Latch, MD   25 mg at 07/20/19 1123  . midodrine (PROAMATINE) tablet 5 mg  5 mg Oral TID WC Skeet Latch, MD      . ondansetron [facility]) injection 4 mg  4 mg Intravenous Q6H PRN Poplawski, Rafal, MD      . Rivaroxaban (XARELTO) tablet 15 mg  15 mg Oral Q2000 Poplawski, Rafal, MD   15 mg at 07/19/19 2034  . sodium chloride flush (NS) 0.9 % injection 3 mL  3 mL Intravenous Q12H Poplawski, Rafal, MD   3 mL at 07/19/19 0933  . sodium chloride flush (NS) 0.9 % injection 3 mL  3 mL Intravenous PRN Poplawski, Rafal, MD      . timolol (TIMOPTIC) 0.5 % ophthalmic solution 1 drop  1 drop Both Eyes BID Poplawski, Rafal, MD   1 drop at 07/19/19 2035  . traMADol (ULTRAM) tablet 50 mg  50 mg Oral Q6H PRN Poplawski, Rafal, MD         Discharge Medications: Please see discharge summary for a list of discharge medications.  Relevant Imaging Results:  Relevant Lab Results:   Additional Information SSN [id number]  Emeterio Reeve, [place]

## 2019-07-20 NOTE — Progress Notes (Signed)
This chaplain responded to PMT consult for spiritual care.  The Pt. states, "I am wiped out."  The chaplain will F/U on Thursday.

## 2019-07-20 NOTE — Progress Notes (Signed)
Progress Note  Patient Name: Ann Booth Date of Encounter: 07/20/2019  Primary Cardiologist: Olga Millers, MD   Subjective   Feeling well.  Denies pain, chest pain, or shortness of breath.  Inpatient Medications    Scheduled Meds: . aspirin  81 mg Oral Daily  . atorvastatin  40 mg Oral Q2000  . citalopram  10 mg Oral Daily  . digoxin  0.125 mg Oral Daily  . donepezil  10 mg Oral QHS  . furosemide  40 mg Intravenous Q12H  . lidocaine  1 patch Transdermal Q24H  . metoprolol succinate  25 mg Oral Daily  . midodrine  5 mg Oral TID WC  . Rivaroxaban  15 mg Oral Q2000  . sodium chloride flush  3 mL Intravenous Q12H  . timolol  1 drop Both Eyes BID   Continuous Infusions: . sodium chloride     PRN Meds: sodium chloride, acetaminophen, ondansetron (ZOFRAN) IV, sodium chloride flush, traMADol   Vital Signs    Vitals:   07/19/19 1816 07/19/19 1951 07/20/19 0054 07/20/19 0541  BP:  111/84  117/89  Pulse: (!) 101 87  93  Resp:  15  16  Temp:  98.2 F (36.8 C)  (!) 97.5 F (36.4 C)  TempSrc:  Oral  Oral  SpO2:  100%  98%  Weight:   54.9 kg   Height:        Intake/Output Summary (Last 24 hours) at 07/20/2019 1000 Last data filed at 07/20/2019 0841 Gross per 24 hour  Intake 600 ml  Output 2304 ml  Net -1704 ml   Last 3 Weights 07/20/2019 07/19/2019 07/18/2019  Weight (lbs) 121 lb 122 lb 2.2 oz 128 lb 4.9 oz  Weight (kg) 54.885 kg 55.4 kg 58.2 kg      Telemetry    Atrial flutter.  Rate 100s to 110s.- Personally Reviewed  ECG    n/a - Personally Reviewed  Physical Exam   VS:  BP 117/89 (BP Location: Right Arm)   Pulse 93   Temp (!) 97.5 F (36.4 C) (Oral)   Resp 16   Ht 5\' 2"  (1.575 m)   Wt 54.9 kg   SpO2 98%   BMI 22.13 kg/m  , BMI Body mass index is 22.13 kg/m. GENERAL: Chronically ill-appearing.  Frail HEENT: Pupils equal round and reactive, fundi not visualized, oral mucosa unremarkable NECK:  + jugular venous distention to upper neck.+  HJR, waveform within normal limits, carotid upstroke brisk and symmetric, no bruits, no thyromegaly LUNGS:  Clear to auscultation anteriorly.  Unable to listen posteriorly. HEART: Tachycardic.  Irregularly irregular.  PMI not displaced or sustained,S1 and S2 within normal limits, no S3, no S4, no clicks, no rubs, III/VI systolic murmur at the left lower sternal border ABD:  Flat, positive bowel sounds normal in frequency in pitch, no bruits, no rebound, no guarding, no midline pulsatile mass, no hepatomegaly, no splenomegaly EXT:  2 plus pulses throughout, 1+ lower extremity edema to above the ankles, no cyanosis no clubbing SKIN:  No rashes no nodules NEURO:  Cranial nerves II through XII grossly intact, motor grossly intact throughout PSYCH: Pleasant mood   Labs    High Sensitivity Troponin:  No results for input(s): TROPONINIHS in the last 720 hours.    Chemistry Recent Labs  Lab 07/18/19 1615 07/19/19 0502 07/20/19 0634  NA 143 145 143  K 2.9* 3.4* 4.2  CL 100 103 102  CO2 31 32 32  GLUCOSE 87 89 96  BUN 19 16 16   CREATININE 1.18* 0.96 0.98  CALCIUM 8.6* 8.7* 8.9  PROT 5.4*  --   --   ALBUMIN 2.3*  --   --   AST 65*  --   --   ALT 62*  --   --   ALKPHOS 146*  --   --   BILITOT 1.3*  --   --   GFRNONAA 40* 52* 50*  GFRAA 47* 60* 58*  ANIONGAP 12 10 9      Hematology Recent Labs  Lab 07/18/19 1615 07/20/19 0634  WBC 6.2 6.0  RBC 4.81 5.15*  HGB 14.1 15.1*  HCT 44.6 47.8*  MCV 92.7 92.8  MCH 29.3 29.3  MCHC 31.6 31.6  RDW 17.4* 18.5*  PLT 125* 128*    BNP Recent Labs  Lab 07/18/19 2017  BNP 1,061.2*     DDimer No results for input(s): DDIMER in the last 168 hours.   Radiology    DG Ribs Unilateral W/Chest Left  Result Date: 07/18/2019 CLINICAL DATA:  2018 1 transferring to a chair yesterday EXAM: LEFT RIBS AND CHEST - 3+ VIEW COMPARISON:  Chest radiograph 04/08/2019 FINDINGS: Enlargement of cardiac silhouette. Atherosclerotic calcification aorta.  Small RIGHT pleural effusion and basilar atelectasis. Moderate LEFT pleural effusion with significant atelectasis of the lower LEFT lung. Peribronchial thickening and scattered interstitial changes again identified. No pneumothorax. Diffuse osseous demineralization. No definite LEFT rib fractures identified. Minimally displaced fracture of the distal LEFT clavicle is identified however. IMPRESSION: Moderate LEFT pleural effusion and significant atelectasis of the lower LEFT lung. Small RIGHT pleural effusion and mild RIGHT basilar atelectasis. Minimally displaced fracture distal LEFT clavicle. No definite rib fractures identified. Electronically Signed   By: Larey Seat M.D.   On: 07/18/2019 15:56   DG Clavicle Left  Result Date: 07/18/2019 CLINICAL DATA:  07/20/2019 when transferring to a chair yesterday, dementia EXAM: LEFT CLAVICLE - 2+ VIEWS COMPARISON:  Chest radiograph 04/08/2019 FINDINGS: Osseous demineralization. AC joint alignment normal. Clavicular head symmetric. Minimally displaced fracture of the distal LEFT clavicle. LEFT pleural effusion present. No additional fracture seen. IMPRESSION: Minimally displaced fracture of the distal LEFT clavicle. Electronically Signed   By: Larey Seat M.D.   On: 07/18/2019 15:58   CT Head Wo Contrast  Result Date: 07/18/2019 CLINICAL DATA:  Head trauma. Fall yesterday with clavicle fracture. EXAM: CT HEAD WITHOUT CONTRAST CT CERVICAL SPINE WITHOUT CONTRAST TECHNIQUE: Multidetector CT imaging of the head and cervical spine was performed following the standard protocol without intravenous contrast. Multiplanar CT image reconstructions of the cervical spine were also generated. COMPARISON:  Head CT 06/22/2018, head MRI 06/23/2018, and cervical spine CT 06/11/2018 FINDINGS: CT HEAD FINDINGS Brain: There is no evidence of acute infarct, intracranial hemorrhage, mass, midline shift, or extra-axial fluid collection. Cerebral atrophy is within normal limits for age.  Hypodensities in the cerebral white matter bilaterally are unchanged and nonspecific but compatible with moderate chronic small vessel ischemic disease. Vascular: Calcified atherosclerosis at the skull base. No hyperdense vessel. Skull: No fracture or suspicious osseous lesion. Sinuses/Orbits: Visualized paranasal sinuses and mastoid air cells are clear. Bilateral cataract extraction. Other: None. CT CERVICAL SPINE FINDINGS The study is mildly motion degraded. Alignment: Cervical spine straightening with grade 1 anterolisthesis of C3 on C4, C4 on C5, C5 on C6, and C7 on T1, likely degenerative. Skull base and vertebrae: No acute fracture is identified within limitations of motion artifact. No destructive osseous process. Unchanged chronic T3 compression fracture with severe anterior vertebral body  height loss. Soft tissues and spinal canal: No prevertebral fluid or swelling. No visible canal hematoma. Disc levels: Moderate cervical disc and facet degeneration, similar to the prior CT. Upper chest: Reported separately. Other: None. IMPRESSION: 1. No evidence of acute intracranial abnormality. 2. Moderate chronic small vessel ischemic disease. 3. No acute cervical spine fracture identified within limitations of motion artifact. 4. Chronic T3 compression fracture. Electronically Signed   By: Sebastian Ache M.D.   On: 07/18/2019 18:42   CT Chest W Contrast  Result Date: 07/18/2019 CLINICAL DATA:  86 year old female with history of chest pain and shortness of breath. History of trauma from a fall. EXAM: CT CHEST WITH CONTRAST TECHNIQUE: Multidetector CT imaging of the chest was performed during intravenous contrast administration. CONTRAST:  56mL OMNIPAQUE IOHEXOL 300 MG/ML  SOLN COMPARISON:  Chest CT 07/11/2010. FINDINGS: Cardiovascular: Heart size is enlarged with biatrial dilatation. There is no significant pericardial fluid, thickening or pericardial calcification. There is aortic atherosclerosis, as well as  atherosclerosis of the great vessels of the mediastinum and the coronary arteries, including calcified atherosclerotic plaque in the left anterior descending and right coronary arteries. Thickening and calcification of the aortic valve. Mediastinum/Nodes: No pathologically enlarged mediastinal or hilar lymph nodes. Esophagus is unremarkable in appearance. Axillary lymphadenopathy. Lungs/Pleura: Moderate to large bilateral pleural effusions. The right pleural effusion layers dependently, where as the left pleural effusion appears partially loculated laterally. These are associated with areas of extensive passive atelectasis in the lower lobes of the lungs bilaterally. Patchy areas of ground-glass attenuation in the upper lungs bilaterally with some associated interlobular septal thickening, favored to reflect some mild pulmonary edema. No definite suspicious appearing pulmonary nodules or masses are noted. Upper Abdomen: Aortic atherosclerosis. Musculoskeletal: Chronic appearing compression fracture of T3 with 80% loss of anterior vertebral body height and 30% loss of posterior vertebral body height. IMPRESSION: 1. Cardiomegaly with biatrial dilatation, evidence of mild pulmonary edema and moderate to large bilateral pleural effusions; imaging findings suggestive of congestive heart failure. 2. The left pleural effusion appears potentially partially loculated laterally, while the right pleural effusion is simple in appearance. 3. Aortic atherosclerosis, in addition to two vessel coronary artery disease. 4. There are calcifications of the aortic valve. Echocardiographic correlation for evaluation of potential valvular dysfunction may be warranted if clinically indicated. Aortic Atherosclerosis (ICD10-I70.0). Electronically Signed   By: Trudie Reed M.D.   On: 07/18/2019 18:47   CT Cervical Spine Wo Contrast  Result Date: 07/18/2019 CLINICAL DATA:  Head trauma. Fall yesterday with clavicle fracture. EXAM: CT  HEAD WITHOUT CONTRAST CT CERVICAL SPINE WITHOUT CONTRAST TECHNIQUE: Multidetector CT imaging of the head and cervical spine was performed following the standard protocol without intravenous contrast. Multiplanar CT image reconstructions of the cervical spine were also generated. COMPARISON:  Head CT 06/22/2018, head MRI 06/23/2018, and cervical spine CT 06/11/2018 FINDINGS: CT HEAD FINDINGS Brain: There is no evidence of acute infarct, intracranial hemorrhage, mass, midline shift, or extra-axial fluid collection. Cerebral atrophy is within normal limits for age. Hypodensities in the cerebral white matter bilaterally are unchanged and nonspecific but compatible with moderate chronic small vessel ischemic disease. Vascular: Calcified atherosclerosis at the skull base. No hyperdense vessel. Skull: No fracture or suspicious osseous lesion. Sinuses/Orbits: Visualized paranasal sinuses and mastoid air cells are clear. Bilateral cataract extraction. Other: None. CT CERVICAL SPINE FINDINGS The study is mildly motion degraded. Alignment: Cervical spine straightening with grade 1 anterolisthesis of C3 on C4, C4 on C5, C5 on C6, and C7 on T1,  likely degenerative. Skull base and vertebrae: No acute fracture is identified within limitations of motion artifact. No destructive osseous process. Unchanged chronic T3 compression fracture with severe anterior vertebral body height loss. Soft tissues and spinal canal: No prevertebral fluid or swelling. No visible canal hematoma. Disc levels: Moderate cervical disc and facet degeneration, similar to the prior CT. Upper chest: Reported separately. Other: None. IMPRESSION: 1. No evidence of acute intracranial abnormality. 2. Moderate chronic small vessel ischemic disease. 3. No acute cervical spine fracture identified within limitations of motion artifact. 4. Chronic T3 compression fracture. Electronically Signed   By: Sebastian Ache M.D.   On: 07/18/2019 18:42   DG Hip Unilat W or Wo  Pelvis 2-3 Views Left  Result Date: 07/18/2019 CLINICAL DATA:  Larey Seat when transferring to a chair yesterday, tenderness and pain EXAM: DG HIP (WITH OR WITHOUT PELVIS) 2-3V LEFT COMPARISON:  None FINDINGS: Diffuse osseous demineralization. Joint spaces preserved. No acute fracture, dislocation, or bone destruction. Degenerative changes and scoliosis at visualized lumbar spine. Pelvic phleboliths noted. IMPRESSION: No acute osseous abnormalities. Electronically Signed   By: Ulyses Southward M.D.   On: 07/18/2019 15:53    Cardiac Studies   Echo 04/08/2019: 1. Left ventricular ejection fraction, by estimation, is 30 to 35%. The  left ventricle has moderate to severely decreased function. The left  ventricle demonstrates regional wall motion abnormalities (see scoring  diagram/findings for description). There  is mildly increased left ventricular hypertrophy. Left ventricular  diastolic parameters are indeterminate.  2. Right ventricular systolic function is moderately reduced. The right  ventricular size is normal. There is severely elevated pulmonary artery  systolic pressure. The estimated right ventricular systolic pressure is  73.7 mmHg.  3. Left atrial size was severely dilated.  4. Right atrial size was severely dilated.  5. The mitral valve is degenerative. Moderate to severe mitral valve  regurgitation.  6. The tricuspid valve is abnormal. Tricuspid valve regurgitation is  moderate to severe.  7. The aortic valve is tricuspid. Aortic valve regurgitation is not  visualized. Mild to moderate aortic valve sclerosis/calcification is  present, without any evidence of aortic stenosis.  8. The inferior vena cava is dilated in size with <50% respiratory  variability, suggesting right atrial pressure of 15 mmHg.   Patient Profile      Ann Booth is a 6F with persistent atrial fibrillation, moderate to severe TR, chronic systolic and diastolic heart failure, CAD s/p NSTEMI (03/2019),  hypertension, hyperlipidemia, and dementia admitted with acute on chronic heart failure.  Assessment & Plan    #Acute on chronic systolic congestive heart failure: LVEF 30 to 34%.  Blood pressure was initially low.  It seems to have improved with midodrine and has allowed [place] to diurese her.  She was net -2 L in the last 24 hours.  She was also started on low-dose digoxin.  We will need to check a level in a couple days.  Her home metoprolol was reduced and switch to succinate.  Blood pressure prohibits ACE inhibitor/ARB/spironolactone at this time.  #CAD: NSTEMI medically managed 03/2019.  She is not a candidate for cath.  Metoprolol was reduced as above.  Continue aspirin and atorvastatin.  She currently denies chest pain.  #Persistent atrial flutter: Rates remain poorly controlled.  Digoxin was added yesterday but was not bolused.  Hopefully this will continue to improve as she is loaded.  Continue metoprolol as above.  Continue Xarelto.      For questions or updates, please contact [facility] Please consult [email or web address] for contact info under        Signed, Skeet Latch, MD  07/20/2019, 10:00 AM

## 2019-07-20 NOTE — Consult Note (Signed)
Consultation Note Date: 07/20/2019   Patient Name: Ann Booth  DOB: 10/29/1926  MRN: [id number]  Age / Sex: 86 y.o., female  PCP: Corwin Levins, MD Referring Physician: Zannie Cove, MD  Reason for Consultation: Establishing goals of care  HPI/Patient Profile: 86 y.o. female  with past medical history of persistent atrial fibrillation, chronic heart failure with reduced EF (30-35%), coronary artery disease status post NSTEMI February 2021, severe pulmonary hypertension, valvular heart disease, HTN, hyperlipidemia, and generalized anxiety disorder who presented to the ED on 07/18/2019 for evaluation of a fall. In the ED she was found to be tachypneic, potassium was 2.9, BNP was 1061. Imaging showed bilateral pleural effusions and left clavicle fracture. She was admitted for management of acute chronic exacerbation of systolic CHF (CHFrEF), acute respiratory failure with hypoxemia, hypokalemia, bilateral pleural effusions, and acute left clavicle fracture.   Clinical Assessment and Goals of Care:  I have reviewed medical records including EPIC notes, labs and imaging, examined the patient. She is OOB to chair, oriented to person, place, and situation; able to state she came to the hospital after falling out of bed. Left arm is in a sling; patient reports she is having "a little" pain in that arm. No family currently at bedside. I called [name] (nephew and HCPOA) and his wife Henri to discuss diagnosis, prognosis, GOC, EOL wishes, disposition, and options.  I introduced Palliative Medicine as specialized medical care for people living with serious illness. It focuses on providing relief from the symptoms and stress of a serious illness.   We discussed a brief life review of the patient. She has lived in [place] her entire life. She was married to her husband for over 50 years; he passed away about 10 years  ago. They did not have any children, but Ray states they were like "second parents" to him. In her prime, she worked at the [employer] for about 40 years. Ray states love of animals was always important to his aunt.  As far as functional and nutritional status, she has lived at [facility] since her February 2021 (after her MI). Prior to that, she was having multiple falls at home. At [facility], she is mostly wheelchair bound and requires assistance with all transfers. Regarding her cognitive baseline, family reports she is mostly aware on some days, but confused and having hallucinations on other days. Family also reports that at baseline, she has shortness of breath even with minimal exertion.   We discussed her current illness and what it means in the larger context of her ongoing co-morbidities.  Natural disease trajectory of heart failure was discussed.  Hospice and Palliative Care services outpatient were explained and offered. Family would like for her to receive hospice care at [facility] if possible.   Questions and concerns were addressed.  The family was encouraged to call with questions or concerns.   Primary Decision Maker: [name] (nephew and HCPOA)    SUMMARY OF RECOMMENDATIONS   - continue current medical plan  of care without escalation of care - TOC order for hospice care at [facility]:  DNR  Symptom Management:   Continue Tramadol prn for pain  Palliative Prophylaxis:   Frequent Pain Assessment  Psycho-social/Spiritual:   Desire for further Chaplain support:yes  Prognosis:   < 6 months  Discharge Planning: [facility] with Hospice      Primary Diagnoses: Present on Admission: . Acute systolic (congestive) heart failure ([place]) . Acute pulmonary edema (H[facility]) . Acute respiratory failure with hypoxia ([place]) . Hyperlipidemia . Depression . Anxiety state . HTN (hypertension) .  Atrial fibrillation ([place]) . Ischemic cardiomyopathy . Fracture of left clavicle   I have reviewed the medical record, interviewed the patient and family, and examined the patient. The following aspects are pertinent.  Past Medical History:  Diagnosis Date  . ALLERGIC RHINITIS   . ANXIETY   . ASTHMA   . Atrial fibrillation ([place])   . BACK PAIN   . Carotid stenosis 04/27/2012   Mild left on community screening  - Nov 2013  . CATARACT NOS   . DEPRESSION   . GLAUCOMA NOS   . HTN (hypertension)   . Hypercalcemia   . MITRAL VALVE PROLAPSE   . OSTEOPOROSIS   . PERIPHERAL NEUROPATHY   . POSITIONAL VERTIGO   . Rosacea    Social History   Socioeconomic History  . Marital status: Widowed    Spouse name: Not on file  . Number of children: 0  . Years of education: Not on file  . Highest education level: Not on file  Occupational History  . Occupation: retired Designer, industrial/product: RETIRED  Tobacco Use  . Smoking status: Never Smoker  . Smokeless tobacco: Never Used  Substance and Sexual Activity  . Alcohol use: No  . Drug use: No  . Sexual activity: Never  Other Topics Concern  . Not on file  Social History Narrative  . Not on file    Family History  Problem Relation Age of Onset  . CAD Mother        MI at age 40  . Cancer Sister        colon  . Cancer Brother        prostate cancer   Scheduled Meds: . aspirin  81 mg Oral Daily  . atorvastatin  40 mg Oral Q2000  . citalopram  10 mg Oral Daily  . digoxin  0.125 mg Oral Daily  . donepezil  10 mg Oral QHS  . furosemide  40 mg Intravenous Q12H  . lidocaine  1 patch Transdermal Q24H  . metoprolol succinate  25 mg Oral Daily  . midodrine  5 mg Oral TID WC  . Rivaroxaban  15 mg Oral Q2000  . sodium chloride flush  3 mL Intravenous Q12H  . timolol  1 drop Both Eyes BID   Continuous Infusions: . sodium chloride     PRN Meds:.sodium chloride, acetaminophen, ondansetron (ZOFRAN) IV, sodium chloride flush,  traMADol Medications Prior to Admission:  Prior to Admission medications   Medication Sig Start Date End Date Taking? Authorizing Provider  aspirin 81 MG chewable tablet Chew 81 mg by mouth daily.   Yes [provider]  atorvastatin (LIPITOR) 40 MG tablet Take 1 tablet (40 mg total) by mouth daily at 6 PM. Patient taking differently: Take 40 mg by mouth daily at 8 pm.  04/12/19  Yes Duke, Tami Lin, PA  benzonatate (TESSALON) 100  MG capsule Take 100 mg by mouth 3 (three) times daily as needed for cough.   Yes [provider]  citalopram (CELEXA) 10 MG tablet Take 1 tablet (10 mg total) by mouth daily. 03/22/19 03/21/20 Yes Corwin Levins, MD  donepezil (ARICEPT) 10 MG tablet Take 10 mg by mouth at bedtime.   Yes [provider]  furosemide (LASIX) 20 MG tablet TAKE 1 TABLET BY MOUTH ONCE DAILY AS NEEDED FOR SWELLING Patient taking differently: Take 20 mg by mouth daily.  03/29/18  Yes Lewayne Bunting, MD  isosorbide dinitrate (ISORDIL) 10 MG tablet Take 1 tablet (10 mg total) by mouth 2 (two) times daily. 04/12/19  Yes Duke, Roe Rutherford, PA  lidocaine (LIDODERM) 5 % Place 1 patch onto the skin daily. Remove & Discard patch within 12 hours or as directed by MD Patient taking differently: Place 1 patch onto the skin daily. Apply 1 patch to affected area every morning (8AM) and remove in the evening (8PM) 10/05/17  Yes Corwin Levins, MD  metoprolol tartrate (LOPRESSOR) 50 MG tablet Take 1 tablet (50 mg total) by mouth 2 (two) times daily. 04/12/19  Yes Duke, Roe Rutherford, PA  nitroGLYCERIN (NITROSTAT) 0.4 MG SL tablet Place 1 tablet (0.4 mg total) under the tongue every 5 (five) minutes x 3 doses as needed for chest pain. 04/12/19  Yes Duke, Roe Rutherford, PA  Nutritional Supplements (NUTRITIONAL DRINK) LIQD Take 90 mLs by mouth daily. Med Pass   Yes [provider]  Rivaroxaban (XARELTO) 15 MG TABS tablet Take 1 tablet (15 mg total) by mouth daily with  supper. Patient taking differently: Take 15 mg by mouth daily at 8 pm.  11/03/18  Yes Crenshaw, Madolyn Frieze, MD  timolol (TIMOPTIC) 0.5 % ophthalmic solution Place 1 drop into both eyes in the morning and at bedtime. 06/06/19  Yes [provider]  traMADol (ULTRAM) 50 MG tablet Take 1 tablet (50 mg total) by mouth every 6 (six) hours as needed. Patient taking differently: Take 50 mg by mouth every 6 (six) hours as needed for moderate pain or severe pain.  06/17/18  Yes Corwin Levins, MD   Allergies  Allergen Reactions  . Cymbalta [Duloxetine Hcl] Other (See Comments)   Review of Systems  Musculoskeletal:       Left arm pain    Physical Exam Vitals reviewed.  Constitutional:      General: She is not in acute distress.    Appearance: She is ill-appearing.  HENT:     Head: Normocephalic and atraumatic.  Pulmonary:     Effort: Pulmonary effort is normal.  Skin:    General: Skin is warm and dry.  Neurological:     Mental Status: She is alert.  Psychiatric:     Comments: Oriented to person, place, situation     Vital Signs: BP 117/89 (BP Location: Right Arm)   Pulse 93   Temp (!) 97.5 F (36.4 C) (Oral)   Resp 16   Ht 5\' 2"  (1.575 m)   Wt 54.9 kg   SpO2 98%   BMI 22.13 kg/m  Pain Scale: 0-10   Pain Score: 0-No pain   SpO2: SpO2: 98 % O2 Device:SpO2: 98 % O2 Flow Rate: .O2 Flow Rate (L/min): 2 L/min  IO: Intake/output summary:   Intake/Output Summary (Last 24 hours) at 07/20/2019 1001 Last data filed at 07/20/2019 0841 Gross per 24 hour  Intake 600 ml  Output 2304 ml  Net -1704 ml  LBM: Last BM Date: 07/19/19 Baseline Weight: Weight: 58.2 kg Most recent weight: Weight: 54.9 kg      Palliative Assessment/Data: 40%`    Time In: 9:20 Time Out: 10:30 Time Total: 70 minutes Greater than 50%  of this time was spent counseling and coordinating care related to the above assessment and plan.  Signed by: Merry Proud, NP   Please contact Palliative  Medicine Team phone at [phone] for questions and concerns.  For individual provider: See Amion  This NP [name] NP reviewed and agree with above assessment and plan.

## 2019-07-21 ENCOUNTER — Inpatient Hospital Stay (HOSPITAL_COMMUNITY): Payer: PPO

## 2019-07-21 DIAGNOSIS — M79602 Pain in left arm: Secondary | ICD-10-CM

## 2019-07-21 DIAGNOSIS — Z66 Do not resuscitate: Secondary | ICD-10-CM

## 2019-07-21 LAB — BASIC METABOLIC PANEL
Anion gap: 12 (ref 5–15)
BUN: 12 mg/dL (ref 8–23)
CO2: 32 mmol/L (ref 22–32)
Calcium: 8.7 mg/dL — ABNORMAL LOW (ref 8.9–10.3)
Chloride: 101 mmol/L (ref 98–111)
Creatinine, Ser: 0.97 mg/dL (ref 0.44–1.00)
GFR calc Af Amer: 59 mL/min — ABNORMAL LOW (ref >60)
GFR calc non Af Amer: 51 mL/min — ABNORMAL LOW (ref >60)
Glucose, Bld: 85 mg/dL (ref 70–99)
Potassium: 3.4 mmol/L — ABNORMAL LOW (ref 3.5–5.1)
Sodium: 145 mmol/L (ref 135–145)

## 2019-07-21 LAB — CBC
HCT: 49.6 % — ABNORMAL HIGH (ref 36.0–46.0)
Hemoglobin: 15.5 g/dL — ABNORMAL HIGH (ref 12.0–15.0)
MCH: 29.1 pg (ref 26.0–34.0)
MCHC: 31.3 g/dL (ref 30.0–36.0)
MCV: 93.1 fL (ref 80.0–100.0)
Platelets: 109 10*3/uL — ABNORMAL LOW (ref 150–400)
RBC: 5.33 MIL/uL — ABNORMAL HIGH (ref 3.87–5.11)
RDW: 18.5 % — ABNORMAL HIGH (ref 11.5–15.5)
WBC: 6 10*3/uL (ref 4.0–10.5)
nRBC: 0 % (ref 0.0–0.2)

## 2019-07-21 MED ORDER — METOPROLOL SUCCINATE ER 50 MG PO TB24
50.0000 mg | ORAL_TABLET | Freq: Every day | ORAL | Status: DC
  Administered 2019-07-22: 50 mg via ORAL
  Filled 2019-07-21 (×2): qty 1

## 2019-07-21 MED ORDER — FUROSEMIDE 40 MG PO TABS
40.0000 mg | ORAL_TABLET | Freq: Every day | ORAL | Status: DC
  Administered 2019-07-21: 40 mg via ORAL
  Filled 2019-07-21: qty 1

## 2019-07-21 NOTE — Progress Notes (Signed)
PROGRESS NOTE    Ann Booth  EHU:[id number] DOB: May 22, 1926 DOA: 07/18/2019 PCP: Corwin Levins, MD   Brief Narrative: 89/F from SNF with past medical history significant for persistent A. 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  Patient also complaining of chronic shortness of breath, lower extremity edema. In the ED she was found to be tachypneic, tachycardic, CT head and CT cervical spine showed moderate ischemic vascular disease, chronic old T3 compression fracture.  CT chest showed moderate Left pleural effusion, X-ray of the left clavicle show a minimally displaced acute fracture of the left distal clavicle.    Assessment & Plan:   Acute on Chronic systolic heart failure exacerbation,  Acute respiratory failure with hypoxemia Valvular heart disease moderate to severe MR and TR.   Severe pulmonary hypertension Moderate Left pleural effusion -clinically improving, pleural effusions improving as well   -Cardiology input appreciated -continue IV lasix today, repeat CXR w/ improving effusions -Continue metoprolol, also started on midodrine  Moderate left pleural effusion:  Small right pleural effusion -Cannot rule out small loculation on the left side. -Continue diuretics, repeat chest x-ray today today is improving, no plan for thoracentesis   CAD, ischemic cardiomyopathy with reduced ejection fraction 30 to 35% Continue with medical management aspirin, statin. Hold nitrates due to hypotension.  Persistent atrial fibrillation: - decrease metoprolol due to hypotension -continue digoxin Continue with Xarelto  Fall, acute left clavicle  fracture -Discussed with orthopedics, recommended to continue sling and follow-up with Dr. August Saucer in 2 weeks  Hyperlipidemia: Continue with atorvastatin  Generalized anxiety disorder and major depressive disorder: Continue with Celexa  Cognitive impairment Mild dementia - Continue with Aricept  Hypokalemia; repleted   Estimated body mass index is 18.66 kg/m as calculated from the following:   Height as of this encounter: 5\' 2"  (1.575 m).   Weight as of this encounter: 46.3 kg.   Elderly chronically ill nursing home resident with extensive list of cardiopulmonary problems -May benefit from hospice services at SNF -Palliative care was consulted, status post family meeting with patient's nephew/POA, plan to add hospice services at SNF at discharge  DVT prophylaxis: Xarelto Code Status: DNR Family Communication: No family at bedside Disposition Plan:  Status is: Inpatient  Remains inpatient appropriate because:IV treatments appropriate due to intensity of illness or inability to take PO moderate pleural effusions, fluid overloaded   Dispo: The patient is from: SNF              Anticipated d/c is to:To be determine, SNF with hospice              Anticipated d/c date is: 2 days              Patient currently is not medically stable to d/c.     Consultants:   Cardiology  Palliative  Procedures:   none  Antimicrobials:  none  Subjective: -Still with some shortness of breath but overall improving, discomfort in her left shoulder  Objective: Vitals:   07/20/19 1138 07/20/19 1900 07/21/19 0421 07/21/19 0753  BP: 117/72 (!) 113/99 120/74 125/86  Pulse: 97 (!) 106 80 (!) 101  Resp: 18 18 20 16   Temp:  97.6 F (36.4 C) 97.6  F (36.4 C) (!) 97.4 F (36.3 C)  TempSrc:  Oral Oral Oral  SpO2: 99% 99% 96% 99%  Weight:   46.3 kg   Height:        Intake/Output Summary (Last 24 hours) at 07/21/2019 1114 Last data filed at 07/21/2019 0900 Gross per 24 hour  Intake 600  ml  Output 1500 ml  Net -900 ml   Filed Weights   07/19/19 0552 07/20/19 0054 07/21/19 0421  Weight: 55.4 kg 54.9 kg 46.3 kg    Examination:  General exam: Elderly frail chronically ill female laying in bed, awake alert oriented to self only, pleasantly confused  CVS S1-S2, regular rate rhythm  Respiratory system: Diminished breath sounds at both bases, left greater than right Cardiovascular system: S1 & S2 heard, RRR . Gastrointestinal system: Abdomen is nondistended, soft and nontender. No organomegaly or masses felt. Normal bowel sounds heard. Central nervous system: Moves all extremities, left arm in sling Extremities: 1+ edema, left arm in sling  Data Reviewed: I have personally reviewed following labs and imaging studies  CBC: Recent Labs  Lab 07/18/19 1615 07/20/19 0634 07/21/19 0603  WBC 6.2 6.0 6.0  NEUTROABS 3.7  --   --   HGB 14.1 15.1* 15.5*  HCT 44.6 47.8* 49.6*  MCV 92.7 92.8 93.1  PLT 125* 128* 109*   Basic Metabolic Panel: Recent Labs  Lab 07/18/19 1615 07/19/19 0502 07/20/19 0634 07/21/19 0603  NA 143 145 143 145  K 2.9* 3.4* 4.2 3.4*  CL 100 103 102 101  CO2 31 32 32 32  GLUCOSE 87 89 96 85  BUN 19 16 16 12   CREATININE 1.18* 0.96 0.98 0.97  CALCIUM 8.6* 8.7* 8.9 8.7*   GFR: Estimated Creatinine Clearance: 27.6 mL/min (by C-G formula based on SCr of 0.97 mg/dL). Liver Function Tests: Recent Labs  Lab 07/18/19 1615  AST 65*  ALT 62*  ALKPHOS 146*  BILITOT 1.3*  PROT 5.4*  ALBUMIN 2.3*   No results for input(s): LIPASE, AMYLASE in the last 168 hours. No results for input(s): AMMONIA in the last 168 hours. Coagulation Profile: No results for input(s): INR, PROTIME in the last 168 hours. Cardiac Enzymes: No results for input(s): CKTOTAL, CKMB, CKMBINDEX, TROPONINI in the last 168 hours. BNP (last 3 results) No results for input(s): PROBNP in the last 8760 hours. HbA1C: No results for input(s): HGBA1C in the last 72  hours. CBG: Recent Labs  Lab 07/19/19 1205  GLUCAP 106*   Lipid Profile: No results for input(s): CHOL, HDL, LDLCALC, TRIG, CHOLHDL, LDLDIRECT in the last 72 hours. Thyroid Function Tests: Recent Labs    07/20/19 0634  TSH 0.893   Anemia Panel: Recent Labs    07/20/19 0634  VITAMINB12 1,109*   Sepsis Labs: No results for input(s): PROCALCITON, LATICACIDVEN in the last 168 hours.  Recent Results (from the past 240 hour(s))  SARS Coronavirus 2 by RT PCR (hospital order, performed in [facility] hospital lab) Nasopharyngeal Nasopharyngeal Swab     Status: None   Collection Time: 07/18/19  7:45 PM   Specimen: Nasopharyngeal Swab  Result Value Ref Range Status   SARS Coronavirus 2 NEGATIVE NEGATIVE Final    Comment: (NOTE) SARS-CoV-2 target nucleic acids are NOT DETECTED. The SARS-CoV-2 RNA is generally detectable in upper and lower respiratory specimens during the acute phase of infection. The lowest concentration of SARS-CoV-2 viral copies this assay can detect is 250 copies / mL. A negative result does not preclude SARS-CoV-2 infection and  should not be used as the sole basis for treatment or other patient management decisions.  A negative result may occur with improper specimen collection / handling, submission of specimen other than nasopharyngeal swab, presence of viral mutation(s) within the areas targeted by this assay, and inadequate number of viral copies (<250 copies / mL). A negative result must be combined with clinical observations, patient history, and epidemiological information. Fact Sheet for Patients:   StrictlyIdeas.no Fact Sheet for Healthcare Providers: BankingDealers.co.za This test is not yet approved or cleared  by the [place] FDA and has been authorized for detection and/or diagnosis of SARS-CoV-2 by FDA under an Emergency Use Authorization (EUA).  This EUA will remain in effect (meaning this  test can be used) for the duration of the COVID-19 declaration under Section 564(b)(1) of the Act, 21 U.S.C. section 360bbb-3(b)(1), unless the authorization is terminated or revoked sooner. Performed at [facility], [place] [address], [place], [place] [address]          Radiology Studies: DG Chest 2 View  Result Date: 07/21/2019 CLINICAL DATA:  Pleural effusion EXAM: CHEST - 2 VIEW COMPARISON:  Jul 18, 2019 chest radiograph and chest CT FINDINGS: Moderate pleural effusion on the left, partially loculated, somewhat smaller than on recent studies. Much smaller right pleural effusion noted, smaller than on recent studies. There is associated atelectasis/consolidation in the left lower lobe. Lungs otherwise clear. Heart is enlarged with pulmonary vascularity normal. No adenopathy. There is aortic atherosclerosis. No adenopathy. There is a recent appearing fracture of the lateral left clavicle, stable from recent prior study. No pneumothorax. IMPRESSION: Moderate pleural effusion on the left, partially loculated, with small right pleural effusion. Both effusions appear smaller compared to recent studies. Associated atelectasis/consolidation left lower lobe. Lungs otherwise clear. There is cardiomegaly with pulmonary vascularity normal. Stable appearing fracture lateral left clavicle.  No pneumothorax. Electronically Signed   By: Lowella Grip III M.D.   On: 07/21/2019 08:44        Scheduled Meds: . aspirin  81 mg Oral Daily  . atorvastatin  40 mg Oral Q2000  . citalopram  10 mg Oral Daily  . digoxin  0.125 mg Oral Daily  . donepezil  10 mg Oral QHS  . furosemide  40 mg Oral Daily  . lidocaine  1 patch Transdermal Q24H  . [START ON 07/22/2019] metoprolol succinate  50 mg Oral Daily  . midodrine  5 mg Oral TID WC  . Rivaroxaban  15 mg Oral Q2000  . sodium chloride flush  3 mL Intravenous Q12H  . timolol  1 drop Both Eyes BID   Continuous Infusions: . sodium chloride        LOS: 3 days    Time spent: 35 minutes.     Domenic Polite, MD [facility]   07/21/2019, 11:14 AM

## 2019-07-21 NOTE — TOC CAGE-AID Note (Signed)
Transition of Care [facility]) - CAGE-AID Screening   Patient Details  Name: Ann Booth MRN: [id number] Date of Birth: Jan 09, 1927  [facility] CM/SW Contact:    Jimmy Picket, LCSWA Phone Number: 07/21/2019, 2:31 PM   Clinical Narrative:  Pt denied alcohol use and substance use.   CAGE-AID Screening:    Have You Ever Felt You Ought to Cut Down on Your Drinking or Drug Use?: No Have People Annoyed You By Critizing Your Drinking Or Drug Use?: No Have You Felt Bad Or Guilty About Your Drinking Or Drug Use?: No Have You Ever Had a Drink or Used Drugs First Thing In The Morning to STeady Your Nerves or to Get Rid of a Hangover?: No CAGE-AID Score: 0  Substance Abuse Education Offered: No    Denita Lung, Bridget Hartshorn Clinical Social Worker [phone]

## 2019-07-21 NOTE — Plan of Care (Signed)

## 2019-07-21 NOTE — Progress Notes (Signed)
Progress Note  Patient Name: Ann Booth Date of Encounter: 07/21/2019  Primary Cardiologist: Olga Millers, MD   Subjective   Feeling well.  Denies pain, chest pain, or shortness of breath.  Inpatient Medications    Scheduled Meds: . aspirin  81 mg Oral Daily  . atorvastatin  40 mg Oral Q2000  . citalopram  10 mg Oral Daily  . digoxin  0.125 mg Oral Daily  . donepezil  10 mg Oral QHS  . furosemide  40 mg Intravenous Q12H  . lidocaine  1 patch Transdermal Q24H  . metoprolol succinate  25 mg Oral Daily  . midodrine  5 mg Oral TID WC  . Rivaroxaban  15 mg Oral Q2000  . sodium chloride flush  3 mL Intravenous Q12H  . timolol  1 drop Both Eyes BID   Continuous Infusions: . sodium chloride     PRN Meds: sodium chloride, acetaminophen, ondansetron (ZOFRAN) IV, sodium chloride flush, traMADol   Vital Signs    Vitals:   07/20/19 1138 07/20/19 1900 07/21/19 0421 07/21/19 0753  BP: 117/72 (!) 113/99 120/74 125/86  Pulse: 97 (!) 106 80 (!) 101  Resp: 18 18 20 16   Temp:  97.6 F (36.4 C) 97.6 F (36.4 C) (!) 97.4 F (36.3 C)  TempSrc:  Oral Oral Oral  SpO2: 99% 99% 96% 99%  Weight:   46.3 kg   Height:        Intake/Output Summary (Last 24 hours) at 07/21/2019 0959 Last data filed at 07/21/2019 0400 Gross per 24 hour  Intake 240 ml  Output 1800 ml  Net -1560 ml   Last 3 Weights 07/21/2019 07/20/2019 07/19/2019  Weight (lbs) 102 lb 121 lb 122 lb 2.2 oz  Weight (kg) 46.267 kg 54.885 kg 55.4 kg      Telemetry    Atrial flutter.  Rate 90-100s.- Personally Reviewed  ECG    n/a - Personally Reviewed  Physical Exam   VS:  BP 125/86 (BP Location: Right Arm)   Pulse (!) 101   Temp (!) 97.4 F (36.3 C) (Oral)   Resp 16   Ht 5\' 2"  (1.575 m)   Wt 46.3 kg   SpO2 99%   BMI 18.66 kg/m  , BMI Body mass index is 18.66 kg/m. GENERAL: Chronically ill-appearing.  Frail HEENT: Pupils equal round and reactive, fundi not visualized, oral mucosa unremarkable NECK:   No jugular venous distention to upper neck.+ HJR, waveform within normal limits, carotid upstroke brisk and symmetric, no bruits, no thyromegaly LUNGS:  Clear to auscultation anteriorly.  Unable to listen posteriorly. HEART: Tachycardic.  Irregularly irregular.  PMI not displaced or sustained,S1 and S2 within normal limits, no S3, no S4, no clicks, no rubs, III/VI systolic murmur at the left lower sternal border ABD:  Flat, positive bowel sounds normal in frequency in pitch, no bruits, no rebound, no guarding, no midline pulsatile mass, no hepatomegaly, no splenomegaly EXT:  2 plus pulses throughout, trace lower extremity edema, no cyanosis no clubbing SKIN:  No rashes no nodules NEURO:  Cranial nerves II through XII grossly intact, motor grossly intact throughout PSYCH: Pleasant mood   Labs    High Sensitivity Troponin:  No results for input(s): TROPONINIHS in the last 720 hours.    Chemistry Recent Labs  Lab 07/18/19 1615 07/18/19 1615 07/19/19 0502 07/20/19 0634 07/21/19 0603  NA 143   < > 145 143 145  K 2.9*   < > 3.4* 4.2 3.4*  CL 100   < >  103 102 101  CO2 31   < > 32 32 32  GLUCOSE 87   < > 89 96 85  BUN 19   < > 16 16 12   CREATININE 1.18*   < > 0.96 0.98 0.97  CALCIUM 8.6*   < > 8.7* 8.9 8.7*  PROT 5.4*  --   --   --   --   ALBUMIN 2.3*  --   --   --   --   AST 65*  --   --   --   --   ALT 62*  --   --   --   --   ALKPHOS 146*  --   --   --   --   BILITOT 1.3*  --   --   --   --   GFRNONAA 40*   < > 52* 50* 51*  GFRAA 47*   < > 60* 58* 59*  ANIONGAP 12   < > 10 9 12    < > = values in this interval not displayed.     Hematology Recent Labs  Lab 07/18/19 1615 07/20/19 0634 07/21/19 0603  WBC 6.2 6.0 6.0  RBC 4.81 5.15* 5.33*  HGB 14.1 15.1* 15.5*  HCT 44.6 47.8* 49.6*  MCV 92.7 92.8 93.1  MCH 29.3 29.3 29.1  MCHC 31.6 31.6 31.3  RDW 17.4* 18.5* 18.5*  PLT 125* 128* 109*    BNP Recent Labs  Lab 07/18/19 2017  BNP 1,061.2*     DDimer No results  for input(s): DDIMER in the last 168 hours.   Radiology    DG Chest 2 View  Result Date: 07/21/2019 CLINICAL DATA:  Pleural effusion EXAM: CHEST - 2 VIEW COMPARISON:  Jul 18, 2019 chest radiograph and chest CT FINDINGS: Moderate pleural effusion on the left, partially loculated, somewhat smaller than on recent studies. Much smaller right pleural effusion noted, smaller than on recent studies. There is associated atelectasis/consolidation in the left lower lobe. Lungs otherwise clear. Heart is enlarged with pulmonary vascularity normal. No adenopathy. There is aortic atherosclerosis. No adenopathy. There is a recent appearing fracture of the lateral left clavicle, stable from recent prior study. No pneumothorax. IMPRESSION: Moderate pleural effusion on the left, partially loculated, with small right pleural effusion. Both effusions appear smaller compared to recent studies. Associated atelectasis/consolidation left lower lobe. Lungs otherwise clear. There is cardiomegaly with pulmonary vascularity normal. Stable appearing fracture lateral left clavicle.  No pneumothorax. Electronically Signed   By: 07/23/2019 III M.D.   On: 07/21/2019 08:44    Cardiac Studies   Echo 04/08/2019: 1. Left ventricular ejection fraction, by estimation, is 30 to 35%. The  left ventricle has moderate to severely decreased function. The left  ventricle demonstrates regional wall motion abnormalities (see scoring  diagram/findings for description). There  is mildly increased left ventricular hypertrophy. Left ventricular  diastolic parameters are indeterminate.  2. Right ventricular systolic function is moderately reduced. The right  ventricular size is normal. There is severely elevated pulmonary artery  systolic pressure. The estimated right ventricular systolic pressure is  73.7 mmHg.  3. Left atrial size was severely dilated.  4. Right atrial size was severely dilated.  5. The mitral valve is  degenerative. Moderate to severe mitral valve  regurgitation.  6. The tricuspid valve is abnormal. Tricuspid valve regurgitation is  moderate to severe.  7. The aortic valve is tricuspid. Aortic valve regurgitation is not  visualized. Mild to moderate aortic valve sclerosis/calcification  is  present, without any evidence of aortic stenosis.  8. The inferior vena cava is dilated in size with <50% respiratory  variability, suggesting right atrial pressure of 15 mmHg.   Patient Profile      Ann Booth is a 66F with persistent atrial fibrillation, moderate to severe TR, chronic systolic and diastolic heart failure, CAD s/p NSTEMI (03/2019), hypertension, hyperlipidemia, and dementia admitted with acute on chronic heart failure.  Assessment & Plan    #Acute on chronic systolic congestive heart failure: LVEF 30 to 35%.  Blood pressure was initially low.  It seems to have improved with midodrine and has allowed [place] to diurese her.  She now appears to be neck.  We will switch her Lasix to oral.  At home she was taking Lasix 20 mg daily.  We will increase this to 40.  She was also started on low-dose digoxin.  We will check a digoxin level tomorrow.  Heart rates are slowly improving.  Her blood pressure has been better with the midodrine.  We will increase her metoprolol to 50 mg daily.  At home she was taking metoprolol tartrate 50 mg twice daily.  Blood pressure prohibits ACE inhibitor/ARB/spironolactone at this time.  #CAD: NSTEMI medically managed 03/2019.  She is not a candidate for cath.  Metoprolol was reduced and switch to succinate as above.  Continue aspirin and atorvastatin.  She currently denies chest pain.  # Persistent atrial flutter: Rates remain poorly controlled.  Digoxin was added and titrating metoprolol as above. Continue Xarelto.     For questions or updates, please contact [place] Please consult [email or web address] for contact info under        Signed, Skeet Latch,  MD  07/21/2019, 9:59 AM

## 2019-07-21 NOTE — TOC Progression Note (Addendum)
Transition of Care [facility]) - Progression Note    Patient Details  Name: Ann Booth MRN: [id number] Date of Birth: 11-10-1926  Transition of Care [facility]) CM/SW Contact  Jimmy Picket, [place] Phone Number: 07/21/2019, 2:34 PM  Clinical Narrative:    CSW informed [facility] that pt is expected to d/c on Friday. [facility] requested new covid screen and auth.   3:30 CSW started auth with [employer] for SNF and [facility]AR. Due to epic begin down CSW had to fax clinicals.  CSW continuing to follow.    Expected Discharge Plan: Long Term Nursing Home Barriers to Discharge: Continued Medical Work up  Expected Discharge Plan and Services Expected Discharge Plan: Long Term Nursing Home       Living arrangements for the past 2 months: Skilled Nursing Facility([facility])                                       Social Determinants of Health (SDOH) Interventions    Readmission Risk Interventions No flowsheet data found.  Jimmy Picket, Theresia Majors, [place] Clinical Social Worker [phone]

## 2019-07-22 DIAGNOSIS — W19XXXA Unspecified fall, initial encounter: Secondary | ICD-10-CM

## 2019-07-22 DIAGNOSIS — Y92009 Unspecified place in unspecified non-institutional (private) residence as the place of occurrence of the external cause: Secondary | ICD-10-CM

## 2019-07-22 DIAGNOSIS — Z7189 Other specified counseling: Secondary | ICD-10-CM

## 2019-07-22 DIAGNOSIS — Z515 Encounter for palliative care: Secondary | ICD-10-CM

## 2019-07-22 LAB — BASIC METABOLIC PANEL
Anion gap: 13 (ref 5–15)
BUN: 17 mg/dL (ref 8–23)
CO2: 30 mmol/L (ref 22–32)
Calcium: 8.6 mg/dL — ABNORMAL LOW (ref 8.9–10.3)
Chloride: 101 mmol/L (ref 98–111)
Creatinine, Ser: 0.84 mg/dL (ref 0.44–1.00)
GFR calc Af Amer: >60 mL/min (ref >60)
GFR calc non Af Amer: >60 mL/min (ref >60)
Glucose, Bld: 73 mg/dL (ref 70–99)
Potassium: 4.2 mmol/L (ref 3.5–5.1)
Sodium: 144 mmol/L (ref 135–145)

## 2019-07-22 LAB — SARS CORONAVIRUS 2 (TAT 6-24 HRS): SARS Coronavirus 2: NEGATIVE

## 2019-07-22 LAB — DIGOXIN LEVEL: Digoxin Level: 0.7 ng/mL — ABNORMAL LOW (ref 0.8–2.0)

## 2019-07-22 MED ORDER — MIDODRINE HCL 5 MG PO TABS: 5.0000 mg | ORAL_TABLET | Freq: Two times a day (BID) | ORAL | Status: AC

## 2019-07-22 MED ORDER — DIGOXIN 125 MCG PO TABS: 0.1250 mg | ORAL_TABLET | Freq: Every day | ORAL | Status: AC

## 2019-07-22 MED ORDER — FUROSEMIDE 20 MG PO TABS
40.0000 mg | ORAL_TABLET | Freq: Two times a day (BID) | ORAL | 3 refills | Status: AC
Start: 2019-07-22 — End: ?

## 2019-07-22 MED ORDER — FUROSEMIDE 40 MG PO TABS: 40.0000 mg | ORAL_TABLET | Freq: Two times a day (BID) | ORAL | Status: DC

## 2019-07-22 NOTE — Progress Notes (Signed)
   07/22/19 1323  Assess: MEWS Score  Pulse Rate 92  SpO2 90 %  Assess: MEWS Score  MEWS Temp 0  MEWS Systolic 1  MEWS Pulse 0  MEWS RR 0  MEWS LOC 0  MEWS Score 1  MEWS Score Color [place]

## 2019-07-22 NOTE — Progress Notes (Signed)
Progress Note  Patient Name: Ann Booth Date of Encounter: 07/22/2019  Primary Cardiologist: Olga Millers, MD   Subjective   Feeling well.  Denies pain, chest pain, or shortness of breath.  Hasn't been out of the bed.   Inpatient Medications    Scheduled Meds: . aspirin  81 mg Oral Daily  . atorvastatin  40 mg Oral Q2000  . citalopram  10 mg Oral Daily  . digoxin  0.125 mg Oral Daily  . donepezil  10 mg Oral QHS  . furosemide  40 mg Oral Daily  . lidocaine  1 patch Transdermal Q24H  . metoprolol succinate  50 mg Oral Daily  . midodrine  5 mg Oral TID WC  . Rivaroxaban  15 mg Oral Q2000  . sodium chloride flush  3 mL Intravenous Q12H  . timolol  1 drop Both Eyes BID   Continuous Infusions: . sodium chloride     PRN Meds: sodium chloride, acetaminophen, ondansetron (ZOFRAN) IV, sodium chloride flush, traMADol   Vital Signs    Vitals:   07/21/19 2350 07/22/19 0152 07/22/19 0319 07/22/19 0724  BP: 94/70  113/71 108/87  Pulse: 69  69 70  Resp: 18  14 17   Temp:   (!) 97.2 F (36.2 C) 97.6 F (36.4 C)  TempSrc:   Oral Oral  SpO2: 100%  94%   Weight:  51.3 kg    Height:        Intake/Output Summary (Last 24 hours) at 07/22/2019 0751 Last data filed at 07/22/2019 0600 Gross per 24 hour  Intake 880 ml  Output 750 ml  Net 130 ml   Last 3 Weights 07/22/2019 07/21/2019 07/20/2019  Weight (lbs) 113 lb 102 lb 121 lb  Weight (kg) 51.256 kg 46.267 kg 54.885 kg      Telemetry    Atrial flutter.  Rate 90-100s.- Personally Reviewed  ECG    n/a - Personally Reviewed  Physical Exam   VS:  BP 108/87 (BP Location: Right Arm)   Pulse 70   Temp 97.6 F (36.4 C) (Oral)   Resp 17   Ht 5\' 2"  (1.575 m)   Wt 51.3 kg   SpO2 94%   BMI 20.67 kg/m  , BMI Body mass index is 20.67 kg/m. GENERAL: Chronically ill-appearing.  Frail HEENT: Pupils equal round and reactive, fundi not visualized, oral mucosa unremarkable NECK:  No jugular venous distention to upper  neck.+ HJR, waveform within normal limits, carotid upstroke brisk and symmetric, no bruits, no thyromegaly LUNGS:  Diminished at bases.  Unable to listen posteriorly. HEART: Tachycardic.  Irregularly irregular.  PMI not displaced or sustained,S1 and S2 within normal limits, no S3, no S4, no clicks, no rubs, III/VI systolic murmur at the left lower sternal border ABD:  Flat, positive bowel sounds normal in frequency in pitch, no bruits, no rebound, no guarding, no midline pulsatile mass, no hepatomegaly, no splenomegaly EXT:  2 plus pulses throughout, trace lower extremity edema, no cyanosis no clubbing SKIN:  No rashes no nodules NEURO:  Cranial nerves II through XII grossly intact, motor grossly intact throughout PSYCH: Pleasant mood   Labs    High Sensitivity Troponin:  No results for input(s): TROPONINIHS in the last 720 hours.    Chemistry Recent Labs  Lab 07/18/19 1615 07/18/19 1615 07/19/19 0502 07/20/19 0634 07/21/19 0603  NA 143   < > 145 143 145  K 2.9*   < > 3.4* 4.2 3.4*  CL 100   < >  103 102 101  CO2 31   < > 32 32 32  GLUCOSE 87   < > 89 96 85  BUN 19   < > 16 16 12   CREATININE 1.18*   < > 0.96 0.98 0.97  CALCIUM 8.6*   < > 8.7* 8.9 8.7*  PROT 5.4*  --   --   --   --   ALBUMIN 2.3*  --   --   --   --   AST 65*  --   --   --   --   ALT 62*  --   --   --   --   ALKPHOS 146*  --   --   --   --   BILITOT 1.3*  --   --   --   --   GFRNONAA 40*   < > 52* 50* 51*  GFRAA 47*   < > 60* 58* 59*  ANIONGAP 12   < > 10 9 12    < > = values in this interval not displayed.     Hematology Recent Labs  Lab 07/18/19 1615 07/20/19 0634 07/21/19 0603  WBC 6.2 6.0 6.0  RBC 4.81 5.15* 5.33*  HGB 14.1 15.1* 15.5*  HCT 44.6 47.8* 49.6*  MCV 92.7 92.8 93.1  MCH 29.3 29.3 29.1  MCHC 31.6 31.6 31.3  RDW 17.4* 18.5* 18.5*  PLT 125* 128* 109*    BNP Recent Labs  Lab 07/18/19 2017  BNP 1,061.2*     DDimer No results for input(s): DDIMER in the last 168 hours.    Radiology    DG Chest 2 View  Result Date: 07/21/2019 CLINICAL DATA:  Pleural effusion EXAM: CHEST - 2 VIEW COMPARISON:  Jul 18, 2019 chest radiograph and chest CT FINDINGS: Moderate pleural effusion on the left, partially loculated, somewhat smaller than on recent studies. Much smaller right pleural effusion noted, smaller than on recent studies. There is associated atelectasis/consolidation in the left lower lobe. Lungs otherwise clear. Heart is enlarged with pulmonary vascularity normal. No adenopathy. There is aortic atherosclerosis. No adenopathy. There is a recent appearing fracture of the lateral left clavicle, stable from recent prior study. No pneumothorax. IMPRESSION: Moderate pleural effusion on the left, partially loculated, with small right pleural effusion. Both effusions appear smaller compared to recent studies. Associated atelectasis/consolidation left lower lobe. Lungs otherwise clear. There is cardiomegaly with pulmonary vascularity normal. Stable appearing fracture lateral left clavicle.  No pneumothorax. Electronically Signed   By: 07/23/2019 III M.D.   On: 07/21/2019 08:44    Cardiac Studies   Echo 04/08/2019: 1. Left ventricular ejection fraction, by estimation, is 30 to 35%. The  left ventricle has moderate to severely decreased function. The left  ventricle demonstrates regional wall motion abnormalities (see scoring  diagram/findings for description). There  is mildly increased left ventricular hypertrophy. Left ventricular  diastolic parameters are indeterminate.  2. Right ventricular systolic function is moderately reduced. The right  ventricular size is normal. There is severely elevated pulmonary artery  systolic pressure. The estimated right ventricular systolic pressure is  73.7 mmHg.  3. Left atrial size was severely dilated.  4. Right atrial size was severely dilated.  5. The mitral valve is degenerative. Moderate to severe mitral valve   regurgitation.  6. The tricuspid valve is abnormal. Tricuspid valve regurgitation is  moderate to severe.  7. The aortic valve is tricuspid. Aortic valve regurgitation is not  visualized. Mild to moderate aortic valve sclerosis/calcification  is  present, without any evidence of aortic stenosis.  8. The inferior vena cava is dilated in size with <50% respiratory  variability, suggesting right atrial pressure of 15 mmHg.   Patient Profile     Ms. Mudry is a 77F with persistent atrial fibrillation, moderate to severe TR, chronic systolic and diastolic heart failure, CAD s/p NSTEMI (03/2019), hypertension, hyperlipidemia, and dementia admitted with acute on chronic heart failure.  Assessment & Plan    #Acute on chronic systolic congestive heart failure: LVEF 30 to 35%.  Blood pressure was initially low.  It seems to have improved with midodrine and has allowed [place] to diurese her.  She now appears to be neck.  We will switch her Lasix to oral.  At home she was taking Lasix 20 mg daily.  She was switched to 40mg  po daily and does not have a negative fluid balance.  She remains volume overloaded.  Will increase lasix to 40mg  po bid.  She will need a BMP within a week of discharge.  Check digoxin level today.  Her blood pressure has been better with the midodrine.  Continue metoprolol.   Blood pressure prohibits ACE inhibitor/ARB/spironolactone at this time.  #CAD: NSTEMI medically managed 03/2019.  She is not a candidate for cath.  Metoprolol was reduced and switch to succinate as above.  Continue aspirin and atorvastatin.  She currently denies chest pain.  # Persistent atrial flutter: Rates are much better controlled.  Digoxin was added and titrating metoprolol as above. Continue Xarelto, digoxin and metoprolol.  # Dispo: She is OK for discharge from a cardiac standpoint.  We will arrange follow up.     For questions or updates, please contact [place] Please consult [email or web address] for  contact info under        Signed, Skeet Latch, MD  07/22/2019, 7:51 AM

## 2019-07-22 NOTE — TOC Transition Note (Signed)
Transition of Care [facility]) - CM/SW Discharge Note   Patient Details  Name: Ann Booth MRN: [id number] Date of Birth: [birth year]  Transition of Care [facility]) CM/SW Contact:  Deatra Robinson, [place] Phone Number: 07/22/2019, 11:10 AM   Clinical Narrative: Pt for dc back to [facility] today with hospice following. Janie at [facility] aware of dc and confirmed pt able to return LTC. Spoke to pt's nephew who reports agreeable to dc plan and is requesting [facility] for hospice services. Referral to [facility] [place] with [facility]. DC summary faxed to [facility] [phone]. RN provided with number for report. SW signing off at dc.   Dellie Burns, MSW, LCSW [phone] (coverage)          Final next level of care: Skilled Nursing Facility Barriers to Discharge: No Barriers Identified   Patient Goals and CMS Choice Patient states their goals for this hospitalization and ongoing recovery are:: Pt was unable to express goals. CMS Medicare.gov Compare Post Acute Care list provided to:: Patient Represenative (must comment)(Ray- nephew) Choice offered to / list presented to : [facility] POA / Guardian  Discharge Placement              Patient chooses bed at: [facility] Patient to be transferred to facility by: [facility] Name of family member notified: Ray Patient and family notified of of transfer: 07/22/19  Discharge Plan and Services                                     Social Determinants of Health (SDOH) Interventions     Readmission Risk Interventions No flowsheet data found.

## 2019-07-22 NOTE — Social Work (Signed)
Pt has received auth for ptar transportation. The auth number is [id number].  Health teams stated that pt did not need auth to return to [facility] with hospice support.   Jimmy Picket, Theresia Majors, [place] Clinical Social Worker [phone]

## 2019-07-22 NOTE — Progress Notes (Signed)
Physical Therapy Treatment Patient Details Name: Ann Booth MRN: [id number] DOB: 1926/05/04 Today's Date: 07/22/2019    History of Present Illness Ann Booth is a 86 y.o. female with medical history significant of persistent atrial fibrillation, chronic congestive heart failure with reduced ejection fraction (EF 30-35%), coronary artery disease status post acute anterior STEMI February 2021,  severe pulmonary hypertension, valvular heart disease with moderate MR, moderate to severe TR, hypertension, hyperlipidemia, generalized anxiety disorder who came to hospital for evaluation of mechanical fall.CT head and CT cervical spine, show moderate ischemic vascular disease, chronic old T3 compression fracture. CT chest showed moderate to large bilateral pleural effusion, with mild acute pleural edema, possible loculation of pleural effusion of the left.  X-ray of the left clavicle show a minimally displaced acute fracture of the left distal clavicle.    PT Comments    Pt admitted with above diagnosis. Pt needed mod assist to transfer to recliner squat pivot.  Progress slow. Pt currently with functional limitations due to the deficits listed below (see PT Problem List). Pt will benefit from skilled PT to increase their independence and safety with mobility to allow discharge to the venue listed below.     Follow Up Recommendations  SNF;Supervision/Assistance - 24 hour     Equipment Recommendations  Other (comment)(TBA)    Recommendations for Other Services       Precautions / Restrictions Precautions Precautions: Fall Required Braces or Orthoses: Sling Restrictions Weight Bearing Restrictions: Yes LUE Weight Bearing: Non weight bearing    Mobility  Bed Mobility Overal bed mobility: Needs Assistance Bed Mobility: Supine to Sit     Supine to sit: Mod assist     General bed mobility comments: Needs assist for LEs to EOB and for elevation of trunk  Transfers Overall transfer  level: Needs assistance Equipment used: 2 person hand held assist Transfers: Sit to/from [employer] Sit to Stand: Mod assist;+2 physical assistance;From elevated surface   Squat pivot transfers: Mod assist;+2 physical assistance;From elevated surface     General transfer comment: Pt needs assist for transfers, squat pivot only  Ambulation/Gait             General Gait Details: does not walk at facility per chart   Stairs             Wheelchair Mobility    Modified Rankin (Stroke Patients Only)       Balance Overall balance assessment: Needs assistance Sitting-balance support: Bilateral upper extremity supported;Feet supported Sitting balance-Leahy Scale: Poor Sitting balance - Comments: relies on UE and external support   Standing balance support: Bilateral upper extremity supported;During functional activity Standing balance-Leahy Scale: Poor Standing balance comment: relies on UE and external support                            Cognition Arousal/Alertness: Awake/alert Behavior During Therapy: WFL for tasks assessed/performed Overall Cognitive Status: Within Functional Limits for tasks assessed                                        Exercises General Exercises - Lower Extremity Ankle Circles/Pumps: AROM;Both;10 reps;Supine Long Arc Quad: AROM;Both;15 reps;Seated Hip Flexion/Marching: AROM;Both;15 reps;Seated    General Comments        Pertinent Vitals/Pain Pain Assessment: Faces Faces Pain Scale: Hurts little more Pain Location: left shoulder Pain Descriptors /  Indicators: Aching;Discomfort;Grimacing;Guarding Pain Intervention(s): Limited activity within patient's tolerance;Monitored during session;Repositioned    Home Living                      Prior Function            PT Goals (current goals can now be found in the care plan section) Acute Rehab PT Goals Patient Stated Goal: to get  better Progress towards PT goals: Progressing toward goals    Frequency    Min 2X/week      PT Plan Current plan remains appropriate    Co-evaluation              AM-PAC PT "6 Clicks" Mobility   Outcome Measure  Help needed turning from your back to your side while in a flat bed without using bedrails?: A Lot Help needed moving from lying on your back to sitting on the side of a flat bed without using bedrails?: A Lot Help needed moving to and from a bed to a chair (including a wheelchair)?: Total Help needed standing up from a chair using your arms (e.g., wheelchair or bedside chair)?: Total Help needed to walk in hospital room?: Total Help needed climbing 3-5 steps with a railing? : Total 6 Click Score: 8    End of Session Equipment Utilized During Treatment: Gait belt;Oxygen(2LO2) Activity Tolerance: Patient limited by fatigue Patient left: in chair;with call bell/phone within reach;with chair alarm set Nurse Communication: Mobility status PT Visit Diagnosis: Unsteadiness on feet (R26.81);Muscle weakness (generalized) (M62.81)     Time: 0932-3557 PT Time Calculation (min) (ACUTE ONLY): 14 min  Charges:  $Therapeutic Activity: 8-22 mins                     Ann Booth W,PT Acute Rehabilitation Services Pager:  [phone]  Office:  [phone]     Ann Booth 07/22/2019, 12:12 PM

## 2019-07-22 NOTE — Discharge Summary (Signed)
Physician Discharge Summary  Ann Booth MWN:[id number] DOB: 1926/10/25 DOA: 07/18/2019  PCP: Corwin Levins, MD  Admit date: 07/18/2019 Discharge date: 07/22/2019  Time spent: 35 minutes  Recommendations for Outpatient Follow-up:  SNF with hospice services for comfort focused care BMP in 1 week Cardiology follow-up set up for 6/17  Discharge Diagnoses:  Principal Problem:   Acute systolic (congestive) heart failure (HCC) Acute respiratory failure with hypoxia Moderate left pleural effusion Severe pulmonary hypertension Valvular heart disease, moderate to severe MR and TR Mild dementia Fall Left clavicle fracture   Hyperlipidemia   Anxiety state   Depression   HTN (hypertension)   Atrial fibrillation (HCC)   Weakness generalized   Ischemic cardiomyopathy   Acute pulmonary edema (HCC)   Acute respiratory failure with hypoxia (HCC)   Fracture of left clavicle   Fall at home, initial encounter   DNR (do not resuscitate)   Pain, arm, left   Discharge Condition: Stable  Diet recommendation: Low-sodium, heart healthy  Filed Weights   07/20/19 0054 07/21/19 0421 07/22/19 0152  Weight: 54.9 kg 46.3 kg 51.3 kg    History of present illness:  86/F from SNF with past medical history significant for persistent A. 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  Patient also complaining of chronic shortness of breath, lower extremity edema. In the ED she was found to be tachypneic, tachycardic, CT head and CT cervical spine showed moderate ischemic vascular disease, chronic old T3 compression fracture.  CT chest showed moderate Left pleural effusion, X-ray of  the left clavicle show a minimally displaced acute fracture of the left distal clavicle.    Hospital Course:   Acute on Chronic systolic heart failure exacerbation,  Acute respiratory failure with hypoxemia Valvular heart disease moderate to severe MR and TR.   Severe pulmonary hypertension Moderate Left pleural effusion -On admission she was noted to be profoundly fluid overloaded with moderate to large left pleural effusion and small right pleural effusion  -Due to advanced age 86, dementia and numerous comorbidities we elected to avoid a thoracentesis -followed by cardiology this admission, started on IV diuretics, clinically improving, repeat x-ray notes improvement in pleural effusions  -She will be discharged back to SNF on Lasix 40 mg twice daily for 1 week followed by 40 mg daily, please check BMP in a week  -She will also be continued on metoprolol and started midodrine this admission due to soft, low blood pressures which limited ability to tolerate diuretics  -Blood pressure stable on midodrine, continue this at discharge  -Overall prognosis is poor in the setting of extensive cardiac disease, severe pulmonary hypertension, overall debility, dementia etc. hence palliative care meeting was completed with patient's nephew, decision made for hospice services at SNF  Moderate left pleural effusion:  Small right pleural effusion -Cannot rule out small loculation on the left side. -Continue diuretics, repeat chest x-ray notes improvement, no plan for thoracentesis  -See discussion above  CAD, ischemic cardiomyopathy with reduced ejection fraction 30 to 35% Continue with medical management aspirin, statin. Nitrates discontinued due to low/soft blood pressures  Persistent atrial fibrillation: -Continue metoprolol, Xarelto -Started on low-dose digoxin  Fall, acute left clavicle fracture -Discussed with orthopedics, recommended to continue sling  and follow-up with orthopedics Dr.  August Saucer in 2 weeks  Hyperlipidemia: Continue with atorvastatin  Generalized anxiety disorder and major depressive disorder: Continue with Celexa  Cognitive impairment Mild dementia - Continue with Aricept  Hypokalemia; repleted   Estimated body mass index is 18.66 kg/m as calculated from the following:   Height as of this encounter: 5\' 2"  (1.575 m).   Weight as of this encounter: 46.3 kg.   Elderly chronically ill nursing home resident with extensive list of cardiopulmonary problems -Palliative care was consulted, status post family meeting with patient's nephew/POA, plan to add hospice services at SNF at discharge  Code Status: DNR  Discharge Exam: Vitals:   07/22/19 0319 07/22/19 0724  BP: 113/71 108/87  Pulse: 69 70  Resp: 14 17  Temp: (!) 97.2 F (36.2 C) 97.6 F (36.4 C)  SpO2: 94% 93%    General: AAOx2, cognitive deficits Cardiovascular: S1S2/RRR Respiratory: Decreased BS at bases  Discharge Instructions   Discharge Instructions    Diet - low sodium heart healthy   Complete by: As directed    Increase activity slowly   Complete by: As directed      Allergies as of 07/22/2019      Reactions   Cymbalta [duloxetine Hcl] Other (See Comments)      Medication List    STOP taking these medications   isosorbide dinitrate 10 MG tablet Commonly known as: ISORDIL     TAKE these medications   aspirin 81 MG chewable tablet Chew 81 mg by mouth daily.   atorvastatin 40 MG tablet Commonly known as: LIPITOR Take 1 tablet (40 mg total) by mouth daily at 6 PM. What changed: when to take this   benzonatate 100 MG capsule Commonly known as: TESSALON Take 100 mg by mouth 3 (three) times daily as needed for cough.   citalopram 10 MG tablet Commonly known as: CeleXA Take 1 tablet (10 mg total) by mouth daily.   digoxin 0.125 MG tablet Commonly known as: LANOXIN Take 1 tablet (0.125 mg total) by mouth daily. Start taking on: Jul 23, 2019   donepezil  10 MG tablet Commonly known as: ARICEPT Take 10 mg by mouth at bedtime.   furosemide 20 MG tablet Commonly known as: LASIX Take 2 tablets (40 mg total) by mouth 2 (two) times daily. TAke 40mg  Po BID for 1 week and then 40mg  daily What changed:   how much to take  how to take this  when to take this  additional instructions   lidocaine 5 % Commonly known as: Lidoderm Place 1 patch onto the skin daily. Remove & Discard patch within 12 hours or as directed by MD What changed: additional instructions   metoprolol tartrate 50 MG tablet Commonly known as: LOPRESSOR Take 1 tablet (50 mg total) by mouth 2 (two) times daily.   midodrine 5 MG tablet Commonly known as: PROAMATINE Take 1 tablet (5 mg total) by mouth 2 (two) times daily with a meal.   nitroGLYCERIN 0.4 MG SL tablet Commonly known as: NITROSTAT Place 1 tablet (0.4 mg total) under the tongue every 5 (five) minutes x 3 doses as needed for chest pain.   Nutritional Drink Liqd Take 90 mLs by mouth daily. Med Pass   Rivaroxaban 15 MG Tabs tablet Commonly known as: Xarelto Take 1 tablet (15 mg total) by mouth daily with supper. What changed: when to take this   timolol 0.5 % ophthalmic solution Commonly known as: TIMOPTIC Place 1 drop into both eyes  in the morning and at bedtime.   traMADol 50 MG tablet Commonly known as: ULTRAM Take 1 tablet (50 mg total) by mouth every 6 (six) hours as needed. What changed: reasons to take this      Allergies  Allergen Reactions  . Cymbalta [Duloxetine Hcl] Other (See Comments)    Contact information for follow-up providers    Corrin Parker, PA-C Follow up on 08/11/2019.   Specialties: Advice worker, Cardiology Why: @ 9:45AM Contact information: [address] [place] [address] [place] [address] [phone]            Contact information for after-discharge care    Destination    [facility] Preferred SNF .   Service: Skilled  Nursing Contact information: [address] [place] [address] [phone]                   The results of significant diagnostics from this hospitalization (including imaging, microbiology, ancillary and laboratory) are listed below for reference.    Significant Diagnostic Studies: DG Chest 2 View  Result Date: 07/21/2019 CLINICAL DATA:  Pleural effusion EXAM: CHEST - 2 VIEW COMPARISON:  Jul 18, 2019 chest radiograph and chest CT FINDINGS: Moderate pleural effusion on the left, partially loculated, somewhat smaller than on recent studies. Much smaller right pleural effusion noted, smaller than on recent studies. There is associated atelectasis/consolidation in the left lower lobe. Lungs otherwise clear. Heart is enlarged with pulmonary vascularity normal. No adenopathy. There is aortic atherosclerosis. No adenopathy. There is a recent appearing fracture of the lateral left clavicle, stable from recent prior study. No pneumothorax. IMPRESSION: Moderate pleural effusion on the left, partially loculated, with small right pleural effusion. Both effusions appear smaller compared to recent studies. Associated atelectasis/consolidation left lower lobe. Lungs otherwise clear. There is cardiomegaly with pulmonary vascularity normal. Stable appearing fracture lateral left clavicle.  No pneumothorax. Electronically Signed   By: Bretta Bang III M.D.   On: 07/21/2019 08:44   DG Ribs Unilateral W/Chest Left  Result Date: 07/18/2019 CLINICAL DATA:  Larey Seat 1 transferring to a chair yesterday EXAM: LEFT RIBS AND CHEST - 3+ VIEW COMPARISON:  Chest radiograph 04/08/2019 FINDINGS: Enlargement of cardiac silhouette. Atherosclerotic calcification aorta. Small RIGHT pleural effusion and basilar atelectasis. Moderate LEFT pleural effusion with significant atelectasis of the lower LEFT lung. Peribronchial thickening and scattered interstitial changes again identified. No pneumothorax.  Diffuse osseous demineralization. No definite LEFT rib fractures identified. Minimally displaced fracture of the distal LEFT clavicle is identified however. IMPRESSION: Moderate LEFT pleural effusion and significant atelectasis of the lower LEFT lung. Small RIGHT pleural effusion and mild RIGHT basilar atelectasis. Minimally displaced fracture distal LEFT clavicle. No definite rib fractures identified. Electronically Signed   By: Ulyses Southward M.D.   On: 07/18/2019 15:56   DG Clavicle Left  Result Date: 07/18/2019 CLINICAL DATA:  Larey Seat when transferring to a chair yesterday, dementia EXAM: LEFT CLAVICLE - 2+ VIEWS COMPARISON:  Chest radiograph 04/08/2019 FINDINGS: Osseous demineralization. AC joint alignment normal. Clavicular head symmetric. Minimally displaced fracture of the distal LEFT clavicle. LEFT pleural effusion present. No additional fracture seen. IMPRESSION: Minimally displaced fracture of the distal LEFT clavicle. Electronically Signed   By: Ulyses Southward M.D.   On: 07/18/2019 15:58   CT Head Wo Contrast  Result Date: 07/18/2019 CLINICAL DATA:  Head trauma. Fall yesterday with clavicle fracture. EXAM: CT HEAD WITHOUT CONTRAST CT CERVICAL SPINE WITHOUT CONTRAST TECHNIQUE: Multidetector CT imaging of the head and cervical spine  was performed following the standard protocol without intravenous contrast. Multiplanar CT image reconstructions of the cervical spine were also generated. COMPARISON:  Head CT 06/22/2018, head MRI 06/23/2018, and cervical spine CT 06/11/2018 FINDINGS: CT HEAD FINDINGS Brain: There is no evidence of acute infarct, intracranial hemorrhage, mass, midline shift, or extra-axial fluid collection. Cerebral atrophy is within normal limits for age. Hypodensities in the cerebral white matter bilaterally are unchanged and nonspecific but compatible with moderate chronic small vessel ischemic disease. Vascular: Calcified atherosclerosis at the skull base. No hyperdense vessel. Skull: No  fracture or suspicious osseous lesion. Sinuses/Orbits: Visualized paranasal sinuses and mastoid air cells are clear. Bilateral cataract extraction. Other: None. CT CERVICAL SPINE FINDINGS The study is mildly motion degraded. Alignment: Cervical spine straightening with grade 1 anterolisthesis of C3 on C4, C4 on C5, C5 on C6, and C7 on T1, likely degenerative. Skull base and vertebrae: No acute fracture is identified within limitations of motion artifact. No destructive osseous process. Unchanged chronic T3 compression fracture with severe anterior vertebral body height loss. Soft tissues and spinal canal: No prevertebral fluid or swelling. No visible canal hematoma. Disc levels: Moderate cervical disc and facet degeneration, similar to the prior CT. Upper chest: Reported separately. Other: None. IMPRESSION: 1. No evidence of acute intracranial abnormality. 2. Moderate chronic small vessel ischemic disease. 3. No acute cervical spine fracture identified within limitations of motion artifact. 4. Chronic T3 compression fracture. Electronically Signed   By: Logan Bores M.D.   On: 07/18/2019 18:42   CT Chest W Contrast  Result Date: 07/18/2019 CLINICAL DATA:  86 year old female with history of chest pain and shortness of breath. History of trauma from a fall. EXAM: CT CHEST WITH CONTRAST TECHNIQUE: Multidetector CT imaging of the chest was performed during intravenous contrast administration. CONTRAST:  25mL OMNIPAQUE IOHEXOL 300 MG/ML  SOLN COMPARISON:  Chest CT 07/11/2010. FINDINGS: Cardiovascular: Heart size is enlarged with biatrial dilatation. There is no significant pericardial fluid, thickening or pericardial calcification. There is aortic atherosclerosis, as well as atherosclerosis of the great vessels of the mediastinum and the coronary arteries, including calcified atherosclerotic plaque in the left anterior descending and right coronary arteries. Thickening and calcification of the aortic valve.  Mediastinum/Nodes: No pathologically enlarged mediastinal or hilar lymph nodes. Esophagus is unremarkable in appearance. Axillary lymphadenopathy. Lungs/Pleura: Moderate to large bilateral pleural effusions. The right pleural effusion layers dependently, where as the left pleural effusion appears partially loculated laterally. These are associated with areas of extensive passive atelectasis in the lower lobes of the lungs bilaterally. Patchy areas of ground-glass attenuation in the upper lungs bilaterally with some associated interlobular septal thickening, favored to reflect some mild pulmonary edema. No definite suspicious appearing pulmonary nodules or masses are noted. Upper Abdomen: Aortic atherosclerosis. Musculoskeletal: Chronic appearing compression fracture of T3 with 80% loss of anterior vertebral body height and 30% loss of posterior vertebral body height. IMPRESSION: 1. Cardiomegaly with biatrial dilatation, evidence of mild pulmonary edema and moderate to large bilateral pleural effusions; imaging findings suggestive of congestive heart failure. 2. The left pleural effusion appears potentially partially loculated laterally, while the right pleural effusion is simple in appearance. 3. Aortic atherosclerosis, in addition to two vessel coronary artery disease. 4. There are calcifications of the aortic valve. Echocardiographic correlation for evaluation of potential valvular dysfunction may be warranted if clinically indicated. Aortic Atherosclerosis (ICD10-I70.0). Electronically Signed   By: Vinnie Langton M.D.   On: 07/18/2019 18:47   CT Cervical Spine Wo Contrast  Result Date: 07/18/2019  CLINICAL DATA:  Head trauma. Fall yesterday with clavicle fracture. EXAM: CT HEAD WITHOUT CONTRAST CT CERVICAL SPINE WITHOUT CONTRAST TECHNIQUE: Multidetector CT imaging of the head and cervical spine was performed following the standard protocol without intravenous contrast. Multiplanar CT image reconstructions of  the cervical spine were also generated. COMPARISON:  Head CT 06/22/2018, head MRI 06/23/2018, and cervical spine CT 06/11/2018 FINDINGS: CT HEAD FINDINGS Brain: There is no evidence of acute infarct, intracranial hemorrhage, mass, midline shift, or extra-axial fluid collection. Cerebral atrophy is within normal limits for age. Hypodensities in the cerebral white matter bilaterally are unchanged and nonspecific but compatible with moderate chronic small vessel ischemic disease. Vascular: Calcified atherosclerosis at the skull base. No hyperdense vessel. Skull: No fracture or suspicious osseous lesion. Sinuses/Orbits: Visualized paranasal sinuses and mastoid air cells are clear. Bilateral cataract extraction. Other: None. CT CERVICAL SPINE FINDINGS The study is mildly motion degraded. Alignment: Cervical spine straightening with grade 1 anterolisthesis of C3 on C4, C4 on C5, C5 on C6, and C7 on T1, likely degenerative. Skull base and vertebrae: No acute fracture is identified within limitations of motion artifact. No destructive osseous process. Unchanged chronic T3 compression fracture with severe anterior vertebral body height loss. Soft tissues and spinal canal: No prevertebral fluid or swelling. No visible canal hematoma. Disc levels: Moderate cervical disc and facet degeneration, similar to the prior CT. Upper chest: Reported separately. Other: None. IMPRESSION: 1. No evidence of acute intracranial abnormality. 2. Moderate chronic small vessel ischemic disease. 3. No acute cervical spine fracture identified within limitations of motion artifact. 4. Chronic T3 compression fracture. Electronically Signed   By: Sebastian Ache M.D.   On: 07/18/2019 18:42   DG Hip Unilat W or Wo Pelvis 2-3 Views Left  Result Date: 07/18/2019 CLINICAL DATA:  Larey Seat when transferring to a chair yesterday, tenderness and pain EXAM: DG HIP (WITH OR WITHOUT PELVIS) 2-3V LEFT COMPARISON:  None FINDINGS: Diffuse osseous demineralization.  Joint spaces preserved. No acute fracture, dislocation, or bone destruction. Degenerative changes and scoliosis at visualized lumbar spine. Pelvic phleboliths noted. IMPRESSION: No acute osseous abnormalities. Electronically Signed   By: Ulyses Southward M.D.   On: 07/18/2019 15:53    Microbiology: Recent Results (from the past 240 hour(s))  SARS Coronavirus 2 by RT PCR (hospital order, performed in [place] hospital lab) Nasopharyngeal Nasopharyngeal Swab     Status: None   Collection Time: 07/18/19  7:45 PM   Specimen: Nasopharyngeal Swab  Result Value Ref Range Status   SARS Coronavirus 2 NEGATIVE NEGATIVE Final    Comment: (NOTE) SARS-CoV-2 target nucleic acids are NOT DETECTED. The SARS-CoV-2 RNA is generally detectable in upper and lower respiratory specimens during the acute phase of infection. The lowest concentration of SARS-CoV-2 viral copies this assay can detect is 250 copies / mL. A negative result does not preclude SARS-CoV-2 infection and should not be used as the sole basis for treatment or other patient management decisions.  A negative result may occur with improper specimen collection / handling, submission of specimen other than nasopharyngeal swab, presence of viral mutation(s) within the areas targeted by this assay, and inadequate number of viral copies (<250 copies / mL). A negative result must be combined with clinical observations, patient history, and epidemiological information. Fact Sheet for Patients:   BoilerBrush.com.cy Fact Sheet for Healthcare Providers: https://pope.com/ This test is not yet approved or cleared  by the [place] FDA and has been authorized for detection and/or diagnosis of SARS-CoV-2 by FDA under  an Emergency Use Authorization (EUA).  This EUA will remain in effect (meaning this test can be used) for the duration of the COVID-19 declaration under Section 564(b)(1) of the Act, 21  U.S.C. section 360bbb-3(b)(1), unless the authorization is terminated or revoked sooner. Performed at [facility], [address] [place], [place] [address]   SARS CORONAVIRUS 2 (TAT 6-24 HRS) Nasopharyngeal Nasopharyngeal Swab     Status: None   Collection Time: 07/21/19  4:20 PM   Specimen: Nasopharyngeal Swab  Result Value Ref Range Status   SARS Coronavirus 2 NEGATIVE NEGATIVE Final    Comment: (NOTE) SARS-CoV-2 target nucleic acids are NOT DETECTED. The SARS-CoV-2 RNA is generally detectable in upper and lower respiratory specimens during the acute phase of infection. Negative results do not preclude SARS-CoV-2 infection, do not rule out co-infections with other pathogens, and should not be used as the sole basis for treatment or other patient management decisions. Negative results must be combined with clinical observations, patient history, and epidemiological information. The expected result is Negative. Fact Sheet for Patients: HairSlick.no Fact Sheet for Healthcare Providers: quierodirigir.com This test is not yet approved or cleared by the [place] FDA and  has been authorized for detection and/or diagnosis of SARS-CoV-2 by FDA under an Emergency Use Authorization (EUA). This EUA will remain  in effect (meaning this test can be used) for the duration of the COVID-19 declaration under Section 56 4(b)(1) of the Act, 21 U.S.C. section 360bbb-3(b)(1), unless the authorization is terminated or revoked sooner. Performed at [facility], [address], [place], [place] [address]      Labs: Basic Metabolic Panel: Recent Labs  Lab 07/18/19 1615 07/19/19 0502 07/20/19 0634 07/21/19 0603 07/22/19 0725  NA 143 145 143 145 144  K 2.9* 3.4* 4.2 3.4* 4.2  CL 100 103 102 101 101  CO2 31 32 32 32 30  GLUCOSE 87 89 96 85 73  BUN 19 16 16 12 17   CREATININE 1.18* 0.96 0.98 0.97 0.84  CALCIUM 8.6* 8.7*  8.9 8.7* 8.6*   Liver Function Tests: Recent Labs  Lab 07/18/19 1615  AST 65*  ALT 62*  ALKPHOS 146*  BILITOT 1.3*  PROT 5.4*  ALBUMIN 2.3*   No results for input(s): LIPASE, AMYLASE in the last 168 hours. No results for input(s): AMMONIA in the last 168 hours. CBC: Recent Labs  Lab 07/18/19 1615 07/20/19 0634 07/21/19 0603  WBC 6.2 6.0 6.0  NEUTROABS 3.7  --   --   HGB 14.1 15.1* 15.5*  HCT 44.6 47.8* 49.6*  MCV 92.7 92.8 93.1  PLT 125* 128* 109*   Cardiac Enzymes: No results for input(s): CKTOTAL, CKMB, CKMBINDEX, TROPONINI in the last 168 hours. BNP: BNP (last 3 results) Recent Labs    04/08/19 1653 07/18/19 2017  BNP 476.7* 1,061.2*    ProBNP (last 3 results) No results for input(s): PROBNP in the last 8760 hours.  CBG: Recent Labs  Lab 07/19/19 1205  GLUCAP 106*       Signed:  07/21/19 MD.  [facility] 07/22/2019, 10:16 AM

## 2019-07-22 NOTE — Progress Notes (Signed)
   07/22/19 1322  Assess: MEWS Score  BP (!) 91/59  Pulse Rate 63  Resp 20  SpO2 (!) 82 %  O2 Device Nasal Cannula  Assess: MEWS Score  MEWS Temp 0  MEWS Systolic 1  MEWS Pulse 0  MEWS RR 0  MEWS LOC 0  MEWS Score 1  MEWS Score Color [place] Si

## 2019-07-22 NOTE — Progress Notes (Signed)
[facility])  Referral receive for hospice services at [facility] once pt discharged later today.  Referral received from [facility]) manager with family request to use [facility] for end of life care once that time occurs.  Spoke with Ray, nephew and NOK, answered questions and provided support.  Thank you, Wallis Bamberg RN, BSN, CCRN [facility] Liaison

## 2019-07-22 NOTE — Progress Notes (Signed)
Attempted to call [facility] for report they reported would have nurse call me back.

## 2019-07-22 NOTE — Progress Notes (Signed)
Daily Progress Note   Patient Name: Ann Booth       Date: 07/22/2019 DOB: [birth year]  Age: 86 y.o. MRN#: [id number] Attending Physician: Zannie Cove, MD Primary Care Physician: Corwin Levins, MD Admit Date: 07/18/2019  Reason for Consultation/Follow-up: disposition follow-up  HPI/Patient Profile: 86 y.o. female  with past medical history of persistent atrial fibrillation, chronic heart failure with reduced EF (30-35%), coronary artery disease status post NSTEMI February 2021, severe pulmonary hypertension, valvular heart disease, HTN, hyperlipidemia, and generalized anxiety disorder who presented to the ED on 07/18/2019 for evaluation of a fall. In the ED she was found to be tachypneic, potassium was 2.9, BNP was 1061. Imaging showed bilateral pleural effusions and left clavicle fracture. She was admitted for management of acute chronic exacerbation of systolic CHF (CHFrEF), acute respiratory failure with hypoxemia, hypokalemia, bilateral pleural effusions, and acute left clavicle fracture.   Subjective: Patient is OOB to chair, lunch tray is in front of her. She states she is "full", note she has eaten minimal from her tray. Also reports she is "tired".  Patient has no other complaints at present time.    I had previously recommended her for hospice care services at [facility], and this is noted in the discharge summary, but I do not see where patient has been evaluated by a hospice agency. I reach out to CSW, who states she take take of this. I also briefly spoke with patient's nephew Ray via phone just to confirm that he agrees to hospice care services for his aunt.   Length of Stay: 4   Physical Exam Constitutional:      General: She is not in acute distress.    Appearance: She is  ill-appearing.  HENT:     Head: Normocephalic and atraumatic.  Pulmonary:     Effort: Pulmonary effort is normal.  Skin:    General: Skin is warm and dry.  Neurological:     Mental Status: She is alert.     Comments: Oriented to person and place             Vital Signs: BP 108/87 (BP Location: Right Arm)   Pulse 70   Temp 97.6 F (36.4 C) (Oral)   Resp 17   Ht 5\' 2"  (1.575 m)   Wt 51.3 kg  SpO2 93%   BMI 20.67 kg/m  SpO2: SpO2: 93 % O2 Device: O2 Device: Room Air O2 Flow Rate: O2 Flow Rate (L/min): 2 L/min  Intake/output summary:   Intake/Output Summary (Last 24 hours) at 07/22/2019 1247 Last data filed at 07/22/2019 0600 Gross per 24 hour  Intake 520 ml  Output 750 ml  Net -230 ml   LBM: Last BM Date: 07/21/19 Baseline Weight: Weight: 58.2 kg Most recent weight: Weight: 51.3 kg       Palliative Assessment/Data: 20-30%      Palliative Care Assessment & Plan   Assessment: 86 yo female with acute on chronic heart failure exacerbation now resolved. History of dementia and multiple cardiac related co-morbidities. She is medically stable for discharge back to [facility] today. In previous [place] discussion, family reported she has chronic dyspnea even with mild exertion, making her eligible for hospice services which family agrees to.   Recommendations/Plan: CSW to reach out to family preferred hospice agency   Code Status: DNR  Prognosis:  < 6 months  Discharge Planning: [place] with Hospice  Care plan was discussed with CSW  Thank you for allowing the Palliative Medicine Team to assist in the care of this patient.   Total Time 25 minutes Prolonged Time Billed  no       Greater than 50%  of this time was spent counseling and coordinating care related to the above assessment and plan.  Lavena Bullion, NP  Please contact Palliative Medicine Team phone at [phone] for questions and concerns.

## 2019-07-22 NOTE — Progress Notes (Signed)
   07/22/19 1255  Assess: MEWS Score  Temp 97.6 F (36.4 C)  BP (!) 76/61  Pulse Rate (!) 136  Resp 18  SpO2  (Poor perfusion in fingers )  O2 Device Nasal Cannula  O2 Flow Rate (L/min) 1 L/min  Assess: MEWS Score  MEWS Temp 0  MEWS Systolic 2  MEWS Pulse 3  MEWS RR 0  MEWS LOC 0  MEWS Score 5  MEWS Score Color Red  Assess: if the MEWS score is Yellow or Red  Were vital signs taken at a resting state? No  Focused Assessment Documented focused assessment  Early Detection of Sepsis Score *See Row Information* Low  MEWS guidelines implemented *See Row Information* No, vital signs rechecked  Treat  MEWS Interventions Other (Comment) (rechecked vs were stable pt received Midorine)  Notify: Charge Nurse/RN  Name of Charge Nurse/RN Notified not informed since vs improved with recheck  Notify: Provider  Provider Name/Title Dr.Joseph  Date Provider Notified 07/22/19  Time Provider Notified 1400  Notification Type Call  Notification Reason  (triggered red mews)  Response No new orders  Date of Provider Response 07/22/19  Time of Provider Response 1400 (not notified)  Document  Patient Outcome Other (Comment)  Progress note created (see row info) Yes  repeated vs they were normal patient on midorine for chronic low bp it was given rechecked bp was fine. No interventions required.

## 2019-07-23 DIAGNOSIS — S42022A Displaced fracture of shaft of left clavicle, initial encounter for closed fracture: Secondary | ICD-10-CM | POA: Diagnosis not present

## 2019-07-23 DIAGNOSIS — F329 Major depressive disorder, single episode, unspecified: Secondary | ICD-10-CM | POA: Diagnosis not present

## 2019-07-23 DIAGNOSIS — D649 Anemia, unspecified: Secondary | ICD-10-CM | POA: Diagnosis not present

## 2019-07-23 DIAGNOSIS — E7849 Other hyperlipidemia: Secondary | ICD-10-CM | POA: Diagnosis not present

## 2019-07-23 DIAGNOSIS — I5021 Acute systolic (congestive) heart failure: Secondary | ICD-10-CM | POA: Diagnosis not present

## 2019-07-23 DIAGNOSIS — I340 Nonrheumatic mitral (valve) insufficiency: Secondary | ICD-10-CM | POA: Diagnosis not present

## 2019-07-23 DIAGNOSIS — F0390 Unspecified dementia without behavioral disturbance: Secondary | ICD-10-CM | POA: Diagnosis not present

## 2019-07-23 DIAGNOSIS — R0600 Dyspnea, unspecified: Secondary | ICD-10-CM | POA: Diagnosis not present

## 2019-07-23 DIAGNOSIS — F419 Anxiety disorder, unspecified: Secondary | ICD-10-CM | POA: Diagnosis not present

## 2019-07-23 DIAGNOSIS — E785 Hyperlipidemia, unspecified: Secondary | ICD-10-CM | POA: Diagnosis not present

## 2019-07-23 DIAGNOSIS — I4819 Other persistent atrial fibrillation: Secondary | ICD-10-CM | POA: Diagnosis not present

## 2019-07-23 DIAGNOSIS — E039 Hypothyroidism, unspecified: Secondary | ICD-10-CM | POA: Diagnosis not present

## 2019-07-23 DIAGNOSIS — R627 Adult failure to thrive: Secondary | ICD-10-CM | POA: Diagnosis not present

## 2019-07-23 DIAGNOSIS — I10 Essential (primary) hypertension: Secondary | ICD-10-CM | POA: Diagnosis not present

## 2019-07-23 DIAGNOSIS — R296 Repeated falls: Secondary | ICD-10-CM | POA: Diagnosis not present

## 2019-07-26 DIAGNOSIS — M6281 Muscle weakness (generalized): Secondary | ICD-10-CM | POA: Diagnosis not present

## 2019-07-26 DIAGNOSIS — I7389 Other specified peripheral vascular diseases: Secondary | ICD-10-CM | POA: Diagnosis not present

## 2019-07-26 DIAGNOSIS — R2681 Unsteadiness on feet: Secondary | ICD-10-CM | POA: Diagnosis not present

## 2019-07-26 DIAGNOSIS — S42022A Displaced fracture of shaft of left clavicle, initial encounter for closed fracture: Secondary | ICD-10-CM | POA: Diagnosis not present

## 2019-07-26 DIAGNOSIS — R296 Repeated falls: Secondary | ICD-10-CM | POA: Diagnosis not present

## 2019-07-26 DIAGNOSIS — F411 Generalized anxiety disorder: Secondary | ICD-10-CM | POA: Diagnosis not present

## 2019-07-26 DIAGNOSIS — R278 Other lack of coordination: Secondary | ICD-10-CM | POA: Diagnosis not present

## 2019-07-26 DIAGNOSIS — R41841 Cognitive communication deficit: Secondary | ICD-10-CM | POA: Diagnosis not present

## 2019-07-26 DIAGNOSIS — R2689 Other abnormalities of gait and mobility: Secondary | ICD-10-CM | POA: Diagnosis not present

## 2019-07-26 DIAGNOSIS — S42002A Fracture of unspecified part of left clavicle, initial encounter for closed fracture: Secondary | ICD-10-CM | POA: Diagnosis not present

## 2019-07-26 DIAGNOSIS — F329 Major depressive disorder, single episode, unspecified: Secondary | ICD-10-CM | POA: Diagnosis not present

## 2019-07-26 DIAGNOSIS — I10 Essential (primary) hypertension: Secondary | ICD-10-CM | POA: Diagnosis not present

## 2019-07-26 DIAGNOSIS — F0390 Unspecified dementia without behavioral disturbance: Secondary | ICD-10-CM | POA: Diagnosis not present

## 2019-07-26 DIAGNOSIS — F419 Anxiety disorder, unspecified: Secondary | ICD-10-CM | POA: Diagnosis not present

## 2019-07-26 DIAGNOSIS — E7849 Other hyperlipidemia: Secondary | ICD-10-CM | POA: Diagnosis not present

## 2019-07-26 DIAGNOSIS — E785 Hyperlipidemia, unspecified: Secondary | ICD-10-CM | POA: Diagnosis not present

## 2019-07-26 DIAGNOSIS — I5021 Acute systolic (congestive) heart failure: Secondary | ICD-10-CM | POA: Diagnosis not present

## 2019-07-26 DIAGNOSIS — R627 Adult failure to thrive: Secondary | ICD-10-CM | POA: Diagnosis not present

## 2019-07-27 DIAGNOSIS — F411 Generalized anxiety disorder: Secondary | ICD-10-CM | POA: Diagnosis not present

## 2019-07-27 DIAGNOSIS — I509 Heart failure, unspecified: Secondary | ICD-10-CM | POA: Diagnosis not present

## 2019-07-27 DIAGNOSIS — F0390 Unspecified dementia without behavioral disturbance: Secondary | ICD-10-CM | POA: Diagnosis not present

## 2019-07-27 DIAGNOSIS — I4891 Unspecified atrial fibrillation: Secondary | ICD-10-CM | POA: Diagnosis not present

## 2019-07-29 DIAGNOSIS — Z20828 Contact with and (suspected) exposure to other viral communicable diseases: Secondary | ICD-10-CM | POA: Diagnosis not present

## 2019-07-29 DIAGNOSIS — D649 Anemia, unspecified: Secondary | ICD-10-CM | POA: Diagnosis not present

## 2019-07-29 DIAGNOSIS — I10 Essential (primary) hypertension: Secondary | ICD-10-CM | POA: Diagnosis not present

## 2019-07-30 DIAGNOSIS — Z03818 Encounter for observation for suspected exposure to other biological agents ruled out: Secondary | ICD-10-CM | POA: Diagnosis not present

## 2019-08-03 DIAGNOSIS — Z20828 Contact with and (suspected) exposure to other viral communicable diseases: Secondary | ICD-10-CM | POA: Diagnosis not present

## 2019-08-04 DIAGNOSIS — I4819 Other persistent atrial fibrillation: Secondary | ICD-10-CM | POA: Diagnosis not present

## 2019-08-04 DIAGNOSIS — F329 Major depressive disorder, single episode, unspecified: Secondary | ICD-10-CM | POA: Diagnosis not present

## 2019-08-04 DIAGNOSIS — F419 Anxiety disorder, unspecified: Secondary | ICD-10-CM | POA: Diagnosis not present

## 2019-08-04 DIAGNOSIS — I5032 Chronic diastolic (congestive) heart failure: Secondary | ICD-10-CM | POA: Diagnosis not present

## 2019-08-04 DIAGNOSIS — F0390 Unspecified dementia without behavioral disturbance: Secondary | ICD-10-CM | POA: Diagnosis not present

## 2019-08-04 DIAGNOSIS — R0600 Dyspnea, unspecified: Secondary | ICD-10-CM | POA: Diagnosis not present

## 2019-08-04 DIAGNOSIS — R627 Adult failure to thrive: Secondary | ICD-10-CM | POA: Diagnosis not present

## 2019-08-04 NOTE — Progress Notes (Signed)
Error - pre-charted on patient but then visit was cancelled so did not see patient.

## 2019-08-08 DIAGNOSIS — Z03818 Encounter for observation for suspected exposure to other biological agents ruled out: Secondary | ICD-10-CM | POA: Diagnosis not present

## 2019-08-09 DIAGNOSIS — S91102A Unspecified open wound of left great toe without damage to nail, initial encounter: Secondary | ICD-10-CM | POA: Diagnosis not present

## 2019-08-11 ENCOUNTER — Encounter: Payer: PPO | Admitting: Student

## 2019-08-11 DIAGNOSIS — F329 Major depressive disorder, single episode, unspecified: Secondary | ICD-10-CM | POA: Diagnosis not present

## 2019-08-11 DIAGNOSIS — W050XXA Fall from non-moving wheelchair, initial encounter: Secondary | ICD-10-CM | POA: Diagnosis not present

## 2019-08-11 DIAGNOSIS — R627 Adult failure to thrive: Secondary | ICD-10-CM | POA: Diagnosis not present

## 2019-08-11 DIAGNOSIS — F419 Anxiety disorder, unspecified: Secondary | ICD-10-CM | POA: Diagnosis not present

## 2019-08-11 DIAGNOSIS — I5032 Chronic diastolic (congestive) heart failure: Secondary | ICD-10-CM | POA: Diagnosis not present

## 2019-08-11 DIAGNOSIS — F0390 Unspecified dementia without behavioral disturbance: Secondary | ICD-10-CM | POA: Diagnosis not present

## 2019-08-11 DIAGNOSIS — I7389 Other specified peripheral vascular diseases: Secondary | ICD-10-CM | POA: Diagnosis not present

## 2019-08-12 DIAGNOSIS — I4891 Unspecified atrial fibrillation: Secondary | ICD-10-CM | POA: Diagnosis not present

## 2019-08-12 DIAGNOSIS — I509 Heart failure, unspecified: Secondary | ICD-10-CM | POA: Diagnosis not present

## 2019-08-12 DIAGNOSIS — F411 Generalized anxiety disorder: Secondary | ICD-10-CM | POA: Diagnosis not present

## 2019-08-12 DIAGNOSIS — F0390 Unspecified dementia without behavioral disturbance: Secondary | ICD-10-CM | POA: Diagnosis not present

## 2019-08-19 DIAGNOSIS — R05 Cough: Secondary | ICD-10-CM | POA: Diagnosis not present

## 2019-08-19 DIAGNOSIS — F329 Major depressive disorder, single episode, unspecified: Secondary | ICD-10-CM | POA: Diagnosis not present

## 2019-08-19 DIAGNOSIS — R627 Adult failure to thrive: Secondary | ICD-10-CM | POA: Diagnosis not present

## 2019-08-19 DIAGNOSIS — F0390 Unspecified dementia without behavioral disturbance: Secondary | ICD-10-CM | POA: Diagnosis not present

## 2019-08-19 DIAGNOSIS — F419 Anxiety disorder, unspecified: Secondary | ICD-10-CM | POA: Diagnosis not present

## 2019-08-20 DIAGNOSIS — H4089 Other specified glaucoma: Secondary | ICD-10-CM | POA: Diagnosis not present

## 2019-08-20 DIAGNOSIS — S42022S Displaced fracture of shaft of left clavicle, sequela: Secondary | ICD-10-CM | POA: Diagnosis not present

## 2019-08-20 DIAGNOSIS — I361 Nonrheumatic tricuspid (valve) insufficiency: Secondary | ICD-10-CM | POA: Diagnosis not present

## 2019-08-20 DIAGNOSIS — I5032 Chronic diastolic (congestive) heart failure: Secondary | ICD-10-CM | POA: Diagnosis not present

## 2019-08-20 DIAGNOSIS — F329 Major depressive disorder, single episode, unspecified: Secondary | ICD-10-CM | POA: Diagnosis not present

## 2019-08-20 DIAGNOSIS — F0390 Unspecified dementia without behavioral disturbance: Secondary | ICD-10-CM | POA: Diagnosis not present

## 2019-08-20 DIAGNOSIS — I4819 Other persistent atrial fibrillation: Secondary | ICD-10-CM | POA: Diagnosis not present

## 2019-08-20 DIAGNOSIS — F419 Anxiety disorder, unspecified: Secondary | ICD-10-CM | POA: Diagnosis not present

## 2019-08-22 DIAGNOSIS — R627 Adult failure to thrive: Secondary | ICD-10-CM | POA: Diagnosis not present

## 2019-08-22 DIAGNOSIS — F0390 Unspecified dementia without behavioral disturbance: Secondary | ICD-10-CM | POA: Diagnosis not present

## 2019-08-22 DIAGNOSIS — F329 Major depressive disorder, single episode, unspecified: Secondary | ICD-10-CM | POA: Diagnosis not present

## 2019-08-22 DIAGNOSIS — L309 Dermatitis, unspecified: Secondary | ICD-10-CM | POA: Diagnosis not present

## 2019-08-22 DIAGNOSIS — F419 Anxiety disorder, unspecified: Secondary | ICD-10-CM | POA: Diagnosis not present

## 2019-08-29 ENCOUNTER — Emergency Department (HOSPITAL_COMMUNITY)
Admission: EM | Admit: 2019-08-29 | Discharge: 2019-08-29 | Disposition: A | Discharge status: Home / Self Care | Attending: Emergency Medicine | Admitting: Emergency Medicine

## 2019-08-29 ENCOUNTER — Emergency Department (HOSPITAL_COMMUNITY)

## 2019-08-29 ENCOUNTER — Encounter (HOSPITAL_COMMUNITY): Payer: Self-pay | Admitting: Emergency Medicine

## 2019-08-29 ENCOUNTER — Other Ambulatory Visit: Payer: Self-pay

## 2019-08-29 DIAGNOSIS — Y999 Unspecified external cause status: Secondary | ICD-10-CM | POA: Insufficient documentation

## 2019-08-29 DIAGNOSIS — W19XXXA Unspecified fall, initial encounter: Secondary | ICD-10-CM | POA: Diagnosis not present

## 2019-08-29 DIAGNOSIS — W1830XA Fall on same level, unspecified, initial encounter: Secondary | ICD-10-CM | POA: Diagnosis not present

## 2019-08-29 DIAGNOSIS — F329 Major depressive disorder, single episode, unspecified: Secondary | ICD-10-CM | POA: Diagnosis not present

## 2019-08-29 DIAGNOSIS — S0990XA Unspecified injury of head, initial encounter: Secondary | ICD-10-CM | POA: Diagnosis not present

## 2019-08-29 DIAGNOSIS — M2550 Pain in unspecified joint: Secondary | ICD-10-CM | POA: Diagnosis not present

## 2019-08-29 DIAGNOSIS — S0083XA Contusion of other part of head, initial encounter: Secondary | ICD-10-CM | POA: Diagnosis not present

## 2019-08-29 DIAGNOSIS — R5381 Other malaise: Secondary | ICD-10-CM | POA: Diagnosis not present

## 2019-08-29 DIAGNOSIS — Z7401 Bed confinement status: Secondary | ICD-10-CM | POA: Diagnosis not present

## 2019-08-29 DIAGNOSIS — R9082 White matter disease, unspecified: Secondary | ICD-10-CM | POA: Insufficient documentation

## 2019-08-29 DIAGNOSIS — R627 Adult failure to thrive: Secondary | ICD-10-CM | POA: Diagnosis not present

## 2019-08-29 DIAGNOSIS — F0390 Unspecified dementia without behavioral disturbance: Secondary | ICD-10-CM | POA: Diagnosis not present

## 2019-08-29 DIAGNOSIS — T148XXA Other injury of unspecified body region, initial encounter: Secondary | ICD-10-CM

## 2019-08-29 DIAGNOSIS — R296 Repeated falls: Secondary | ICD-10-CM | POA: Diagnosis not present

## 2019-08-29 DIAGNOSIS — R69 Illness, unspecified: Secondary | ICD-10-CM | POA: Diagnosis not present

## 2019-08-29 DIAGNOSIS — Y939 Activity, unspecified: Secondary | ICD-10-CM | POA: Diagnosis not present

## 2019-08-29 DIAGNOSIS — I4891 Unspecified atrial fibrillation: Secondary | ICD-10-CM | POA: Diagnosis not present

## 2019-08-29 DIAGNOSIS — Y92129 Unspecified place in nursing home as the place of occurrence of the external cause: Secondary | ICD-10-CM | POA: Diagnosis not present

## 2019-08-29 DIAGNOSIS — F419 Anxiety disorder, unspecified: Secondary | ICD-10-CM | POA: Diagnosis not present

## 2019-08-29 LAB — CK: Total CK: 57 U/L (ref 38–234)

## 2019-08-29 LAB — CBC WITH DIFFERENTIAL/PLATELET
Abs Immature Granulocytes: 0 10*3/uL (ref 0.00–0.07)
Basophils Absolute: 0 10*3/uL (ref 0.0–0.1)
Basophils Relative: 0 %
Eosinophils Absolute: 0.2 10*3/uL (ref 0.0–0.5)
Eosinophils Relative: 3 %
HCT: 38.2 % (ref 36.0–46.0)
Hemoglobin: 12 g/dL (ref 12.0–15.0)
Lymphocytes Relative: 20 %
Lymphs Abs: 1 10*3/uL (ref 0.7–4.0)
MCH: 29.9 pg (ref 26.0–34.0)
MCHC: 31.4 g/dL (ref 30.0–36.0)
MCV: 95 fL (ref 80.0–100.0)
Monocytes Absolute: 0.3 10*3/uL (ref 0.1–1.0)
Monocytes Relative: 6 %
Neutro Abs: 3.6 10*3/uL (ref 1.7–7.7)
Neutrophils Relative %: 71 %
Platelets: 189 10*3/uL (ref 150–400)
RBC: 4.02 MIL/uL (ref 3.87–5.11)
RDW: 21.2 % — ABNORMAL HIGH (ref 11.5–15.5)
WBC: 5.1 10*3/uL (ref 4.0–10.5)
nRBC: 0 % (ref 0.0–0.2)
nRBC: 0 /100 WBC

## 2019-08-29 LAB — CBG MONITORING, ED: Glucose-Capillary: 109 mg/dL — ABNORMAL HIGH (ref 70–99)

## 2019-08-29 LAB — BASIC METABOLIC PANEL
Anion gap: 8 (ref 5–15)
BUN: 15 mg/dL (ref 8–23)
CO2: 29 mmol/L (ref 22–32)
Calcium: 9.3 mg/dL (ref 8.9–10.3)
Chloride: 104 mmol/L (ref 98–111)
Creatinine, Ser: 0.71 mg/dL (ref 0.44–1.00)
GFR calc Af Amer: >60 mL/min (ref >60)
GFR calc non Af Amer: >60 mL/min (ref >60)
Glucose, Bld: 99 mg/dL (ref 70–99)
Potassium: 4 mmol/L (ref 3.5–5.1)
Sodium: 141 mmol/L (ref 135–145)

## 2019-08-29 NOTE — ED Notes (Signed)
Pt on table in CT.

## 2019-08-29 NOTE — Progress Notes (Signed)
Orthopedic Tech Progress Note Patient Details:  Ann Booth 12-Jul-1926 [id number] Level 2 Trauma. Not needed. Patient ID: ANIQA HARE, female   DOB: 05/23/1926, 86 y.o.   MRN: [id number]   Lovett Calender 08/29/2019, 11:46 AM

## 2019-08-29 NOTE — ED Notes (Signed)
NT is replacing cords on monitoring equipment will we are in CT. Will get ekg, etc when returning to room.

## 2019-08-29 NOTE — ED Notes (Signed)
[facility] called @ 1259-per Lanora Manis, RN called by Marylene Land

## 2019-08-29 NOTE — ED Notes (Signed)
ECG equipment unable to read, will reattempt when pt returns from CT.

## 2019-08-29 NOTE — ED Triage Notes (Signed)
[facility] reports pt had a ground level fall sometime during the night. Pt has hematoma on left forehead. Pt on 2lpm.  Pt a baseline, some confusion. Pt takes xeralto. V/S

## 2019-08-29 NOTE — ED Notes (Signed)
Called CT. Oral temp unattainable, will do rectal when returning from CT.

## 2019-08-29 NOTE — Progress Notes (Signed)
[facility] Documentation  Pt is a current hospice pt of [facility] and resides at [facility]. [facility] notified [facility] that pt fell and hit her head so they called [phone].   Liaison to continue to follow pt while in ED and if pt is admitted. Please do not hesitate to outreach with any questions.   Thank you,  Trena Platt, RN  [facility] Liaison  [phone]

## 2019-08-29 NOTE — ED Notes (Signed)
Patient verbalizes understanding of discharge instructions. Opportunity for questioning and answers were provided. Armband removed by staff, pt discharged from ED.  

## 2019-08-29 NOTE — ED Notes (Signed)
Report attempted 

## 2019-08-29 NOTE — ED Provider Notes (Signed)
[facility] EMERGENCY DEPARTMENT Provider Note   CSN: [id number] Arrival date & time: 08/29/19  1055     History Chief Complaint  Patient presents with  . Fall    level 2    Ann Booth is a 86 y.o. female.  86 yo F with a chief complaint of a unwitnessed fall.  Patient lives in a skilled nursing facility and she said she was trying to do something and she fell down and she thinks she lost her balance.  She then woke up on the ground.  She is not sure how long she was on the ground she is unsure of the injury made her pass out.  She denies any pain currently.  Denies chest pain shortness of breath abdominal pain neck pain headache extremity pain.  Found to have a hematoma to the left frontal region and was sent via EMS.  The history is provided by the patient.  Illness Severity:  Moderate Onset quality:  Gradual Duration:  6 hours Timing:  Rare Progression:  Resolved Chronicity:  New Associated symptoms: no chest pain, no congestion, no fever, no headaches, no myalgias, no nausea, no rhinorrhea, no shortness of breath, no vomiting and no wheezing        History reviewed. No pertinent past medical history.  There are no problems to display for this patient.   History reviewed. No pertinent surgical history.   OB History   No obstetric history on file.     No family history on file.  Social History   Tobacco Use  . Smoking status: Not on file  Substance Use Topics  . Alcohol use: Not on file  . Drug use: Not on file    Home Medications Prior to Admission medications   Not on File    Allergies    Cymbalta [duloxetine hcl]  Review of Systems   Review of Systems  Constitutional: Negative for chills and fever.  HENT: Negative for congestion and rhinorrhea.   Eyes: Negative for redness and visual disturbance.  Respiratory: Negative for shortness of breath and wheezing.   Cardiovascular: Negative for chest pain and palpitations.    Gastrointestinal: Negative for nausea and vomiting.  Genitourinary: Negative for dysuria and urgency.  Musculoskeletal: Negative for arthralgias and myalgias.  Skin: Negative for pallor and wound.  Neurological: Negative for dizziness and headaches.    Physical Exam Updated Vital Signs BP 123/81   Pulse 61   Temp 97.6 F (36.4 C) (Oral)   Resp 16   Ht 5\' 2"  (1.575 m)   Wt 47 kg   SpO2 100%   BMI 18.95 kg/m   Physical Exam Vitals and nursing note reviewed.  Constitutional:      General: She is not in acute distress.    Appearance: She is well-developed. She is not diaphoretic.     Comments: Frail.  HENT:     Head: Normocephalic.     Comments: Left renal hematoma appears old. Eyes:     Pupils: Pupils are equal, round, and reactive to light.  Cardiovascular:     Rate and Rhythm: Normal rate and regular rhythm.     Heart sounds: No murmur heard.  No friction rub. No gallop.   Pulmonary:     Effort: Pulmonary effort is normal.     Breath sounds: No wheezing or rales.  Abdominal:     General: There is no distension.     Palpations: Abdomen is soft.     Tenderness:  There is no abdominal tenderness.  Musculoskeletal:        General: No tenderness.     Cervical back: Normal range of motion and neck supple.     Comments: Palpated from head to toe without any noted areas of bony tenderness.  Skin:    General: Skin is warm and dry.  Neurological:     Mental Status: She is alert and oriented to person, place, and time.  Psychiatric:        Behavior: Behavior normal.     ED Results / Procedures / Treatments   Labs (all labs ordered are listed, but only abnormal results are displayed) Labs Reviewed  CBC WITH DIFFERENTIAL/PLATELET - Abnormal; Notable for the following components:      Result Value   RDW 21.2 (*)    All other components within normal limits  CBG MONITORING, ED - Abnormal; Notable for the following components:   Glucose-Capillary 109 (*)    All other  components within normal limits  BASIC METABOLIC PANEL  CK    EKG EKG Interpretation  Date/Time:  Monday August 29 2019 11:31:42 EDT Ventricular Rate:  58 PR Interval:    QRS Duration: 100 QT Interval:  480 QTC Calculation: 472 R Axis:   -25 Text Interpretation: Atrial fibrillation Borderline left axis deviation Borderline repol abnormality, diffuse leads No old tracing to compare Confirmed by Melene Plan [phone]) on 08/29/2019 11:51:07 AM   Radiology CT Head Wo Contrast  Result Date: 08/29/2019 CLINICAL DATA:  Fall, blood thinners EXAM: CT HEAD WITHOUT CONTRAST TECHNIQUE: Contiguous axial images were obtained from the base of the skull through the vertex without intravenous contrast. COMPARISON:  07/18/2019 FINDINGS: Brain: No evidence of acute infarction, hemorrhage, hydrocephalus, extra-axial collection or mass lesion/mass effect. Extensive periventricular and deep white matter hypodensity. Vascular: No hyperdense vessel or unexpected calcification. Skull: Normal. Negative for fracture or focal lesion. Sinuses/Orbits: No acute finding. Other: Soft tissue contusion of the left forehead. IMPRESSION: 1. No acute intracranial pathology. Advanced small-vessel white matter disease, in keeping with patient age. 2. Soft tissue contusion of the left forehead. Electronically Signed   By: Lauralyn Primes M.D.   On: 08/29/2019 11:53    Procedures Procedures (including critical care time)  Medications Ordered in ED Medications - No data to display  ED Course  I have reviewed the triage vital signs and the nursing notes.  Pertinent labs & imaging results that were available during my care of the patient were reviewed by me and considered in my medical decision making (see chart for details).    MDM Rules/Calculators/A&P                          86 yo F with a chief complaints of a fall.  Patient thinks that she lost her balance and initially told me that she did not pass out but then states that  she woke up on the ground sometime thereafter.  She has a left frontal hematoma on exam.  Will obtain a CT scan of the head. Blood work and a CK to evaluate.  CK is negative blood work is otherwise unremarkable no significant anemia or electrolyte abnormality.  Blood sugar is normal.  CT scan of the head is negative for acute intracranial pathology.  Discharge home.  2:06 PM:  I have discussed the diagnosis/risks/treatment options with the patient and believe the pt to be eligible for discharge home to follow-up with PCP. We also discussed  returning to the ED immediately if new or worsening sx occur. We discussed the sx which are most concerning (e.g., sudden worsening pain, fever, inability to tolerate by mouth) that necessitate immediate return. Medications administered to the patient during their visit and any new prescriptions provided to the patient are listed below.  Medications given during this visit Medications - No data to display   The patient appears reasonably screen and/or stabilized for discharge and I doubt any other medical condition or other [facility] requiring further screening, evaluation, or treatment in the ED at this time prior to discharge.   Final Clinical Impression(s) / ED Diagnoses Final diagnoses:  Fall, initial encounter  Injury of head, initial encounter  Hematoma    Rx / DC Orders ED Discharge Orders    None       Melene Plan, DO 08/29/19 1406

## 2019-08-29 NOTE — Discharge Instructions (Signed)
Follow up with your family doc. Return for worsening symptoms.  °

## 2019-08-30 ENCOUNTER — Encounter (HOSPITAL_COMMUNITY): Payer: Self-pay

## 2019-09-02 DIAGNOSIS — I4891 Unspecified atrial fibrillation: Secondary | ICD-10-CM | POA: Diagnosis not present

## 2019-09-02 DIAGNOSIS — I509 Heart failure, unspecified: Secondary | ICD-10-CM | POA: Diagnosis not present

## 2019-09-02 DIAGNOSIS — F0390 Unspecified dementia without behavioral disturbance: Secondary | ICD-10-CM | POA: Diagnosis not present

## 2019-09-02 DIAGNOSIS — F411 Generalized anxiety disorder: Secondary | ICD-10-CM | POA: Diagnosis not present

## 2019-09-04 DIAGNOSIS — F329 Major depressive disorder, single episode, unspecified: Secondary | ICD-10-CM | POA: Diagnosis not present

## 2019-09-04 DIAGNOSIS — F0390 Unspecified dementia without behavioral disturbance: Secondary | ICD-10-CM | POA: Diagnosis not present

## 2019-09-04 DIAGNOSIS — R296 Repeated falls: Secondary | ICD-10-CM | POA: Diagnosis not present

## 2019-09-04 DIAGNOSIS — F419 Anxiety disorder, unspecified: Secondary | ICD-10-CM | POA: Diagnosis not present

## 2019-09-04 DIAGNOSIS — I4819 Other persistent atrial fibrillation: Secondary | ICD-10-CM | POA: Diagnosis not present

## 2019-09-04 DIAGNOSIS — I5032 Chronic diastolic (congestive) heart failure: Secondary | ICD-10-CM | POA: Diagnosis not present

## 2019-09-20 ENCOUNTER — Ambulatory Visit: Payer: PPO | Admitting: Internal Medicine

## 2019-09-21 DIAGNOSIS — Z03818 Encounter for observation for suspected exposure to other biological agents ruled out: Secondary | ICD-10-CM | POA: Diagnosis not present

## 2019-09-26 DIAGNOSIS — F419 Anxiety disorder, unspecified: Secondary | ICD-10-CM | POA: Diagnosis not present

## 2019-09-26 DIAGNOSIS — Z03818 Encounter for observation for suspected exposure to other biological agents ruled out: Secondary | ICD-10-CM | POA: Diagnosis not present

## 2019-09-26 DIAGNOSIS — I5032 Chronic diastolic (congestive) heart failure: Secondary | ICD-10-CM | POA: Diagnosis not present

## 2019-09-26 DIAGNOSIS — I10 Essential (primary) hypertension: Secondary | ICD-10-CM | POA: Diagnosis not present

## 2019-09-26 DIAGNOSIS — I7389 Other specified peripheral vascular diseases: Secondary | ICD-10-CM | POA: Diagnosis not present

## 2019-09-26 DIAGNOSIS — R627 Adult failure to thrive: Secondary | ICD-10-CM | POA: Diagnosis not present

## 2019-09-26 DIAGNOSIS — E7849 Other hyperlipidemia: Secondary | ICD-10-CM | POA: Diagnosis not present

## 2019-09-26 DIAGNOSIS — L608 Other nail disorders: Secondary | ICD-10-CM | POA: Diagnosis not present

## 2019-09-26 DIAGNOSIS — F329 Major depressive disorder, single episode, unspecified: Secondary | ICD-10-CM | POA: Diagnosis not present

## 2019-09-26 DIAGNOSIS — F0390 Unspecified dementia without behavioral disturbance: Secondary | ICD-10-CM | POA: Diagnosis not present

## 2019-09-29 DIAGNOSIS — I10 Essential (primary) hypertension: Secondary | ICD-10-CM | POA: Diagnosis not present

## 2019-09-29 DIAGNOSIS — F329 Major depressive disorder, single episode, unspecified: Secondary | ICD-10-CM | POA: Diagnosis not present

## 2019-09-29 DIAGNOSIS — F419 Anxiety disorder, unspecified: Secondary | ICD-10-CM | POA: Diagnosis not present

## 2019-09-29 DIAGNOSIS — I5032 Chronic diastolic (congestive) heart failure: Secondary | ICD-10-CM | POA: Diagnosis not present

## 2019-09-29 DIAGNOSIS — F0390 Unspecified dementia without behavioral disturbance: Secondary | ICD-10-CM | POA: Diagnosis not present

## 2019-09-29 DIAGNOSIS — R627 Adult failure to thrive: Secondary | ICD-10-CM | POA: Diagnosis not present

## 2019-09-29 DIAGNOSIS — R296 Repeated falls: Secondary | ICD-10-CM | POA: Diagnosis not present

## 2019-09-29 DIAGNOSIS — Z20822 Contact with and (suspected) exposure to covid-19: Secondary | ICD-10-CM | POA: Diagnosis not present

## 2019-09-29 DIAGNOSIS — I4819 Other persistent atrial fibrillation: Secondary | ICD-10-CM | POA: Diagnosis not present

## 2019-09-30 DIAGNOSIS — I509 Heart failure, unspecified: Secondary | ICD-10-CM | POA: Diagnosis not present

## 2019-09-30 DIAGNOSIS — F0390 Unspecified dementia without behavioral disturbance: Secondary | ICD-10-CM | POA: Diagnosis not present

## 2019-09-30 DIAGNOSIS — F411 Generalized anxiety disorder: Secondary | ICD-10-CM | POA: Diagnosis not present

## 2019-09-30 DIAGNOSIS — I4891 Unspecified atrial fibrillation: Secondary | ICD-10-CM | POA: Diagnosis not present

## 2019-10-03 DIAGNOSIS — Z20822 Contact with and (suspected) exposure to covid-19: Secondary | ICD-10-CM | POA: Diagnosis not present

## 2019-10-06 DIAGNOSIS — Z20822 Contact with and (suspected) exposure to covid-19: Secondary | ICD-10-CM | POA: Diagnosis not present

## 2019-10-10 DIAGNOSIS — Z20822 Contact with and (suspected) exposure to covid-19: Secondary | ICD-10-CM | POA: Diagnosis not present

## 2019-10-17 DIAGNOSIS — Z20822 Contact with and (suspected) exposure to covid-19: Secondary | ICD-10-CM | POA: Diagnosis not present

## 2019-10-24 DIAGNOSIS — Z20828 Contact with and (suspected) exposure to other viral communicable diseases: Secondary | ICD-10-CM | POA: Diagnosis not present

## 2019-10-29 DIAGNOSIS — I4819 Other persistent atrial fibrillation: Secondary | ICD-10-CM | POA: Diagnosis not present

## 2019-10-29 DIAGNOSIS — I340 Nonrheumatic mitral (valve) insufficiency: Secondary | ICD-10-CM | POA: Diagnosis not present

## 2019-10-29 DIAGNOSIS — F329 Major depressive disorder, single episode, unspecified: Secondary | ICD-10-CM | POA: Diagnosis not present

## 2019-10-29 DIAGNOSIS — R627 Adult failure to thrive: Secondary | ICD-10-CM | POA: Diagnosis not present

## 2019-10-29 DIAGNOSIS — F0390 Unspecified dementia without behavioral disturbance: Secondary | ICD-10-CM | POA: Diagnosis not present

## 2019-10-29 DIAGNOSIS — I5032 Chronic diastolic (congestive) heart failure: Secondary | ICD-10-CM | POA: Diagnosis not present

## 2019-10-29 DIAGNOSIS — F419 Anxiety disorder, unspecified: Secondary | ICD-10-CM | POA: Diagnosis not present

## 2019-10-29 DIAGNOSIS — H4089 Other specified glaucoma: Secondary | ICD-10-CM | POA: Diagnosis not present

## 2019-11-01 DIAGNOSIS — Z20828 Contact with and (suspected) exposure to other viral communicable diseases: Secondary | ICD-10-CM | POA: Diagnosis not present

## 2019-11-04 DIAGNOSIS — F411 Generalized anxiety disorder: Secondary | ICD-10-CM | POA: Diagnosis not present

## 2019-11-04 DIAGNOSIS — I4891 Unspecified atrial fibrillation: Secondary | ICD-10-CM | POA: Diagnosis not present

## 2019-11-04 DIAGNOSIS — F0390 Unspecified dementia without behavioral disturbance: Secondary | ICD-10-CM | POA: Diagnosis not present

## 2019-11-04 DIAGNOSIS — I509 Heart failure, unspecified: Secondary | ICD-10-CM | POA: Diagnosis not present

## 2019-11-07 DIAGNOSIS — Z20828 Contact with and (suspected) exposure to other viral communicable diseases: Secondary | ICD-10-CM | POA: Diagnosis not present

## 2019-11-10 DIAGNOSIS — Z20828 Contact with and (suspected) exposure to other viral communicable diseases: Secondary | ICD-10-CM | POA: Diagnosis not present

## 2019-11-14 DIAGNOSIS — Z20828 Contact with and (suspected) exposure to other viral communicable diseases: Secondary | ICD-10-CM | POA: Diagnosis not present

## 2019-11-17 DIAGNOSIS — Z20828 Contact with and (suspected) exposure to other viral communicable diseases: Secondary | ICD-10-CM | POA: Diagnosis not present

## 2019-11-19 DIAGNOSIS — F0390 Unspecified dementia without behavioral disturbance: Secondary | ICD-10-CM | POA: Diagnosis not present

## 2019-11-19 DIAGNOSIS — I5032 Chronic diastolic (congestive) heart failure: Secondary | ICD-10-CM | POA: Diagnosis not present

## 2019-11-19 DIAGNOSIS — F419 Anxiety disorder, unspecified: Secondary | ICD-10-CM | POA: Diagnosis not present

## 2019-11-19 DIAGNOSIS — R627 Adult failure to thrive: Secondary | ICD-10-CM | POA: Diagnosis not present

## 2019-11-19 DIAGNOSIS — I4819 Other persistent atrial fibrillation: Secondary | ICD-10-CM | POA: Diagnosis not present

## 2019-11-19 DIAGNOSIS — F329 Major depressive disorder, single episode, unspecified: Secondary | ICD-10-CM | POA: Diagnosis not present

## 2019-11-20 DIAGNOSIS — Z20828 Contact with and (suspected) exposure to other viral communicable diseases: Secondary | ICD-10-CM | POA: Diagnosis not present

## 2019-11-21 DIAGNOSIS — T148XXA Other injury of unspecified body region, initial encounter: Secondary | ICD-10-CM | POA: Diagnosis not present

## 2019-11-21 DIAGNOSIS — I5032 Chronic diastolic (congestive) heart failure: Secondary | ICD-10-CM | POA: Diagnosis not present

## 2019-11-21 DIAGNOSIS — E7849 Other hyperlipidemia: Secondary | ICD-10-CM | POA: Diagnosis not present

## 2019-11-21 DIAGNOSIS — R627 Adult failure to thrive: Secondary | ICD-10-CM | POA: Diagnosis not present

## 2019-11-21 DIAGNOSIS — I10 Essential (primary) hypertension: Secondary | ICD-10-CM | POA: Diagnosis not present

## 2019-11-21 DIAGNOSIS — F0390 Unspecified dementia without behavioral disturbance: Secondary | ICD-10-CM | POA: Diagnosis not present

## 2019-11-21 DIAGNOSIS — F329 Major depressive disorder, single episode, unspecified: Secondary | ICD-10-CM | POA: Diagnosis not present

## 2019-11-21 DIAGNOSIS — F419 Anxiety disorder, unspecified: Secondary | ICD-10-CM | POA: Diagnosis not present

## 2019-11-21 DIAGNOSIS — R296 Repeated falls: Secondary | ICD-10-CM | POA: Diagnosis not present

## 2019-11-23 DIAGNOSIS — Z20828 Contact with and (suspected) exposure to other viral communicable diseases: Secondary | ICD-10-CM | POA: Diagnosis not present

## 2019-11-28 DIAGNOSIS — Z20828 Contact with and (suspected) exposure to other viral communicable diseases: Secondary | ICD-10-CM | POA: Diagnosis not present

## 2019-12-02 DIAGNOSIS — R269 Unspecified abnormalities of gait and mobility: Secondary | ICD-10-CM | POA: Diagnosis not present

## 2019-12-02 DIAGNOSIS — F0390 Unspecified dementia without behavioral disturbance: Secondary | ICD-10-CM | POA: Diagnosis not present

## 2019-12-02 DIAGNOSIS — I480 Paroxysmal atrial fibrillation: Secondary | ICD-10-CM | POA: Diagnosis not present

## 2019-12-02 DIAGNOSIS — J90 Pleural effusion, not elsewhere classified: Secondary | ICD-10-CM | POA: Diagnosis not present

## 2019-12-02 DIAGNOSIS — F411 Generalized anxiety disorder: Secondary | ICD-10-CM | POA: Diagnosis not present

## 2019-12-02 DIAGNOSIS — I509 Heart failure, unspecified: Secondary | ICD-10-CM | POA: Diagnosis not present

## 2019-12-02 DIAGNOSIS — I4891 Unspecified atrial fibrillation: Secondary | ICD-10-CM | POA: Diagnosis not present

## 2019-12-02 DIAGNOSIS — S0083XA Contusion of other part of head, initial encounter: Secondary | ICD-10-CM | POA: Diagnosis not present

## 2019-12-02 DIAGNOSIS — I2510 Atherosclerotic heart disease of native coronary artery without angina pectoris: Secondary | ICD-10-CM | POA: Diagnosis not present

## 2019-12-05 DIAGNOSIS — F329 Major depressive disorder, single episode, unspecified: Secondary | ICD-10-CM | POA: Diagnosis not present

## 2019-12-05 DIAGNOSIS — R627 Adult failure to thrive: Secondary | ICD-10-CM | POA: Diagnosis not present

## 2019-12-05 DIAGNOSIS — F419 Anxiety disorder, unspecified: Secondary | ICD-10-CM | POA: Diagnosis not present

## 2019-12-05 DIAGNOSIS — I10 Essential (primary) hypertension: Secondary | ICD-10-CM | POA: Diagnosis not present

## 2019-12-05 DIAGNOSIS — S91301A Unspecified open wound, right foot, initial encounter: Secondary | ICD-10-CM | POA: Diagnosis not present

## 2019-12-05 DIAGNOSIS — F0390 Unspecified dementia without behavioral disturbance: Secondary | ICD-10-CM | POA: Diagnosis not present

## 2019-12-05 DIAGNOSIS — Z20828 Contact with and (suspected) exposure to other viral communicable diseases: Secondary | ICD-10-CM | POA: Diagnosis not present

## 2019-12-05 DIAGNOSIS — E7849 Other hyperlipidemia: Secondary | ICD-10-CM | POA: Diagnosis not present

## 2019-12-08 DIAGNOSIS — S91102A Unspecified open wound of left great toe without damage to nail, initial encounter: Secondary | ICD-10-CM | POA: Diagnosis not present

## 2019-12-08 DIAGNOSIS — Z20828 Contact with and (suspected) exposure to other viral communicable diseases: Secondary | ICD-10-CM | POA: Diagnosis not present

## 2019-12-12 DIAGNOSIS — Z20828 Contact with and (suspected) exposure to other viral communicable diseases: Secondary | ICD-10-CM | POA: Diagnosis not present

## 2019-12-15 DIAGNOSIS — S91102A Unspecified open wound of left great toe without damage to nail, initial encounter: Secondary | ICD-10-CM | POA: Diagnosis not present

## 2019-12-15 DIAGNOSIS — Z20828 Contact with and (suspected) exposure to other viral communicable diseases: Secondary | ICD-10-CM | POA: Diagnosis not present

## 2019-12-19 DIAGNOSIS — Z20828 Contact with and (suspected) exposure to other viral communicable diseases: Secondary | ICD-10-CM | POA: Diagnosis not present

## 2019-12-22 DIAGNOSIS — Z20828 Contact with and (suspected) exposure to other viral communicable diseases: Secondary | ICD-10-CM | POA: Diagnosis not present

## 2019-12-25 DIAGNOSIS — Z20828 Contact with and (suspected) exposure to other viral communicable diseases: Secondary | ICD-10-CM | POA: Diagnosis not present

## 2019-12-29 DIAGNOSIS — S91102A Unspecified open wound of left great toe without damage to nail, initial encounter: Secondary | ICD-10-CM | POA: Diagnosis not present

## 2019-12-30 DIAGNOSIS — S0083XA Contusion of other part of head, initial encounter: Secondary | ICD-10-CM | POA: Diagnosis not present

## 2019-12-30 DIAGNOSIS — J90 Pleural effusion, not elsewhere classified: Secondary | ICD-10-CM | POA: Diagnosis not present

## 2019-12-30 DIAGNOSIS — F0390 Unspecified dementia without behavioral disturbance: Secondary | ICD-10-CM | POA: Diagnosis not present

## 2019-12-30 DIAGNOSIS — R269 Unspecified abnormalities of gait and mobility: Secondary | ICD-10-CM | POA: Diagnosis not present

## 2019-12-30 DIAGNOSIS — F411 Generalized anxiety disorder: Secondary | ICD-10-CM | POA: Diagnosis not present

## 2019-12-30 DIAGNOSIS — I2510 Atherosclerotic heart disease of native coronary artery without angina pectoris: Secondary | ICD-10-CM | POA: Diagnosis not present

## 2019-12-30 DIAGNOSIS — I4891 Unspecified atrial fibrillation: Secondary | ICD-10-CM | POA: Diagnosis not present

## 2019-12-30 DIAGNOSIS — I509 Heart failure, unspecified: Secondary | ICD-10-CM | POA: Diagnosis not present

## 2019-12-30 DIAGNOSIS — I480 Paroxysmal atrial fibrillation: Secondary | ICD-10-CM | POA: Diagnosis not present

## 2020-01-01 DIAGNOSIS — F329 Major depressive disorder, single episode, unspecified: Secondary | ICD-10-CM | POA: Diagnosis not present

## 2020-01-01 DIAGNOSIS — I5032 Chronic diastolic (congestive) heart failure: Secondary | ICD-10-CM | POA: Diagnosis not present

## 2020-01-01 DIAGNOSIS — F0390 Unspecified dementia without behavioral disturbance: Secondary | ICD-10-CM | POA: Diagnosis not present

## 2020-01-01 DIAGNOSIS — H4089 Other specified glaucoma: Secondary | ICD-10-CM | POA: Diagnosis not present

## 2020-01-01 DIAGNOSIS — R627 Adult failure to thrive: Secondary | ICD-10-CM | POA: Diagnosis not present

## 2020-01-01 DIAGNOSIS — F419 Anxiety disorder, unspecified: Secondary | ICD-10-CM | POA: Diagnosis not present

## 2020-01-01 DIAGNOSIS — I4819 Other persistent atrial fibrillation: Secondary | ICD-10-CM | POA: Diagnosis not present

## 2020-01-31 DIAGNOSIS — Z20822 Contact with and (suspected) exposure to covid-19: Secondary | ICD-10-CM | POA: Diagnosis not present

## 2020-02-02 DIAGNOSIS — Z20822 Contact with and (suspected) exposure to covid-19: Secondary | ICD-10-CM | POA: Diagnosis not present

## 2020-04-03 DIAGNOSIS — Z20822 Contact with and (suspected) exposure to covid-19: Secondary | ICD-10-CM | POA: Diagnosis not present

## 2020-05-04 DIAGNOSIS — M6281 Muscle weakness (generalized): Secondary | ICD-10-CM | POA: Diagnosis not present

## 2020-05-04 DIAGNOSIS — R2689 Other abnormalities of gait and mobility: Secondary | ICD-10-CM | POA: Diagnosis not present

## 2020-05-04 DIAGNOSIS — F411 Generalized anxiety disorder: Secondary | ICD-10-CM | POA: Diagnosis not present

## 2020-05-04 DIAGNOSIS — S42002A Fracture of unspecified part of left clavicle, initial encounter for closed fracture: Secondary | ICD-10-CM | POA: Diagnosis not present

## 2020-05-04 DIAGNOSIS — R278 Other lack of coordination: Secondary | ICD-10-CM | POA: Diagnosis not present

## 2020-05-04 DIAGNOSIS — E785 Hyperlipidemia, unspecified: Secondary | ICD-10-CM | POA: Diagnosis not present

## 2020-05-04 DIAGNOSIS — R41841 Cognitive communication deficit: Secondary | ICD-10-CM | POA: Diagnosis not present

## 2020-05-04 DIAGNOSIS — R2681 Unsteadiness on feet: Secondary | ICD-10-CM | POA: Diagnosis not present

## 2020-05-04 DIAGNOSIS — R627 Adult failure to thrive: Secondary | ICD-10-CM | POA: Diagnosis not present

## 2020-08-30 DIAGNOSIS — M25562 Pain in left knee: Secondary | ICD-10-CM | POA: Diagnosis not present

## 2020-09-04 DIAGNOSIS — M7989 Other specified soft tissue disorders: Secondary | ICD-10-CM | POA: Diagnosis not present

## 2020-12-08 DIAGNOSIS — I4891 Unspecified atrial fibrillation: Secondary | ICD-10-CM | POA: Diagnosis not present

## 2020-12-18 DIAGNOSIS — I4891 Unspecified atrial fibrillation: Secondary | ICD-10-CM | POA: Diagnosis not present

## 2020-12-18 DIAGNOSIS — E785 Hyperlipidemia, unspecified: Secondary | ICD-10-CM | POA: Diagnosis not present

## 2021-01-23 IMAGING — CR CHEST - 2 VIEW
2 series · 2 of 2 positions shown · non-contrast
Comparison: 06/22/2018

CLINICAL DATA: Chest pain

EXAM:
CHEST - 2 VIEW

[chest lat]
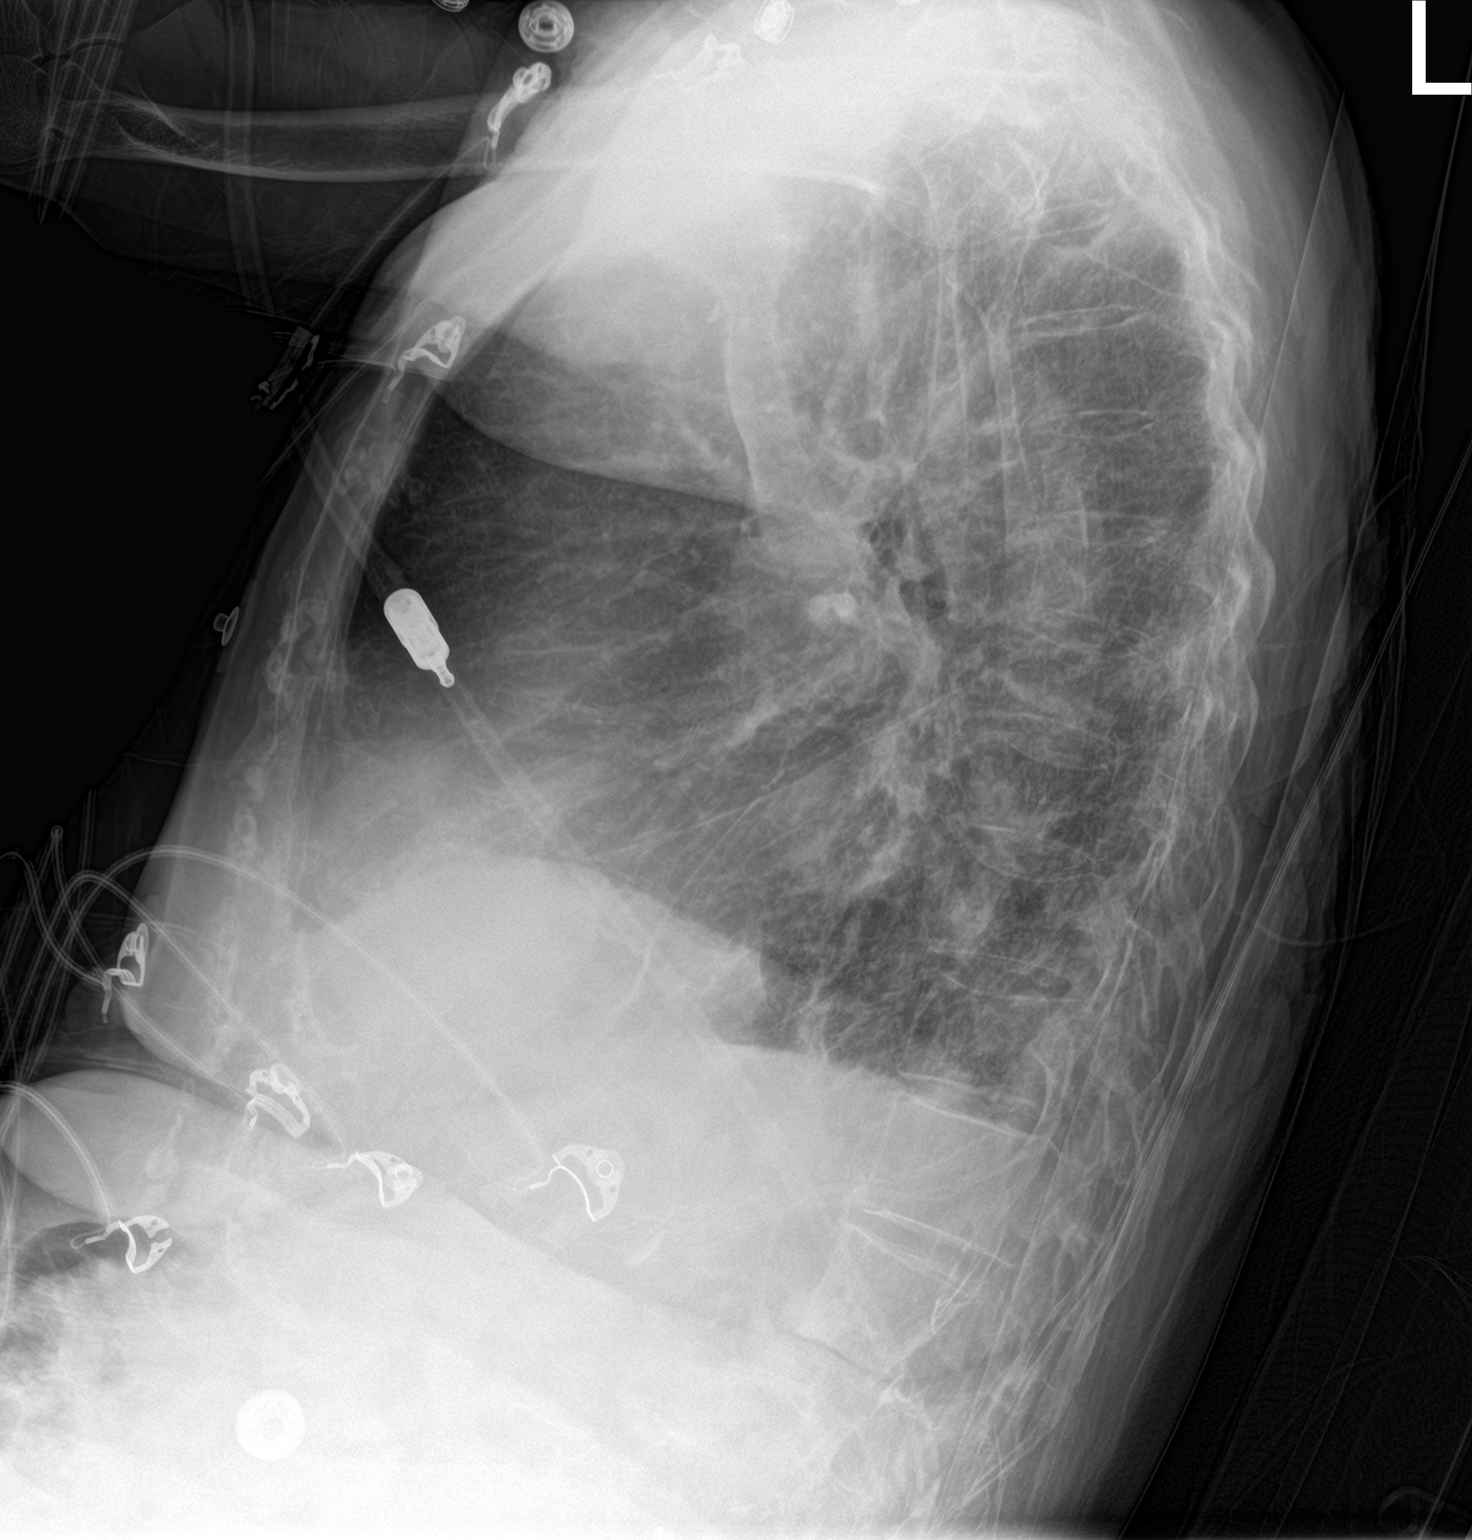

[chest ap]
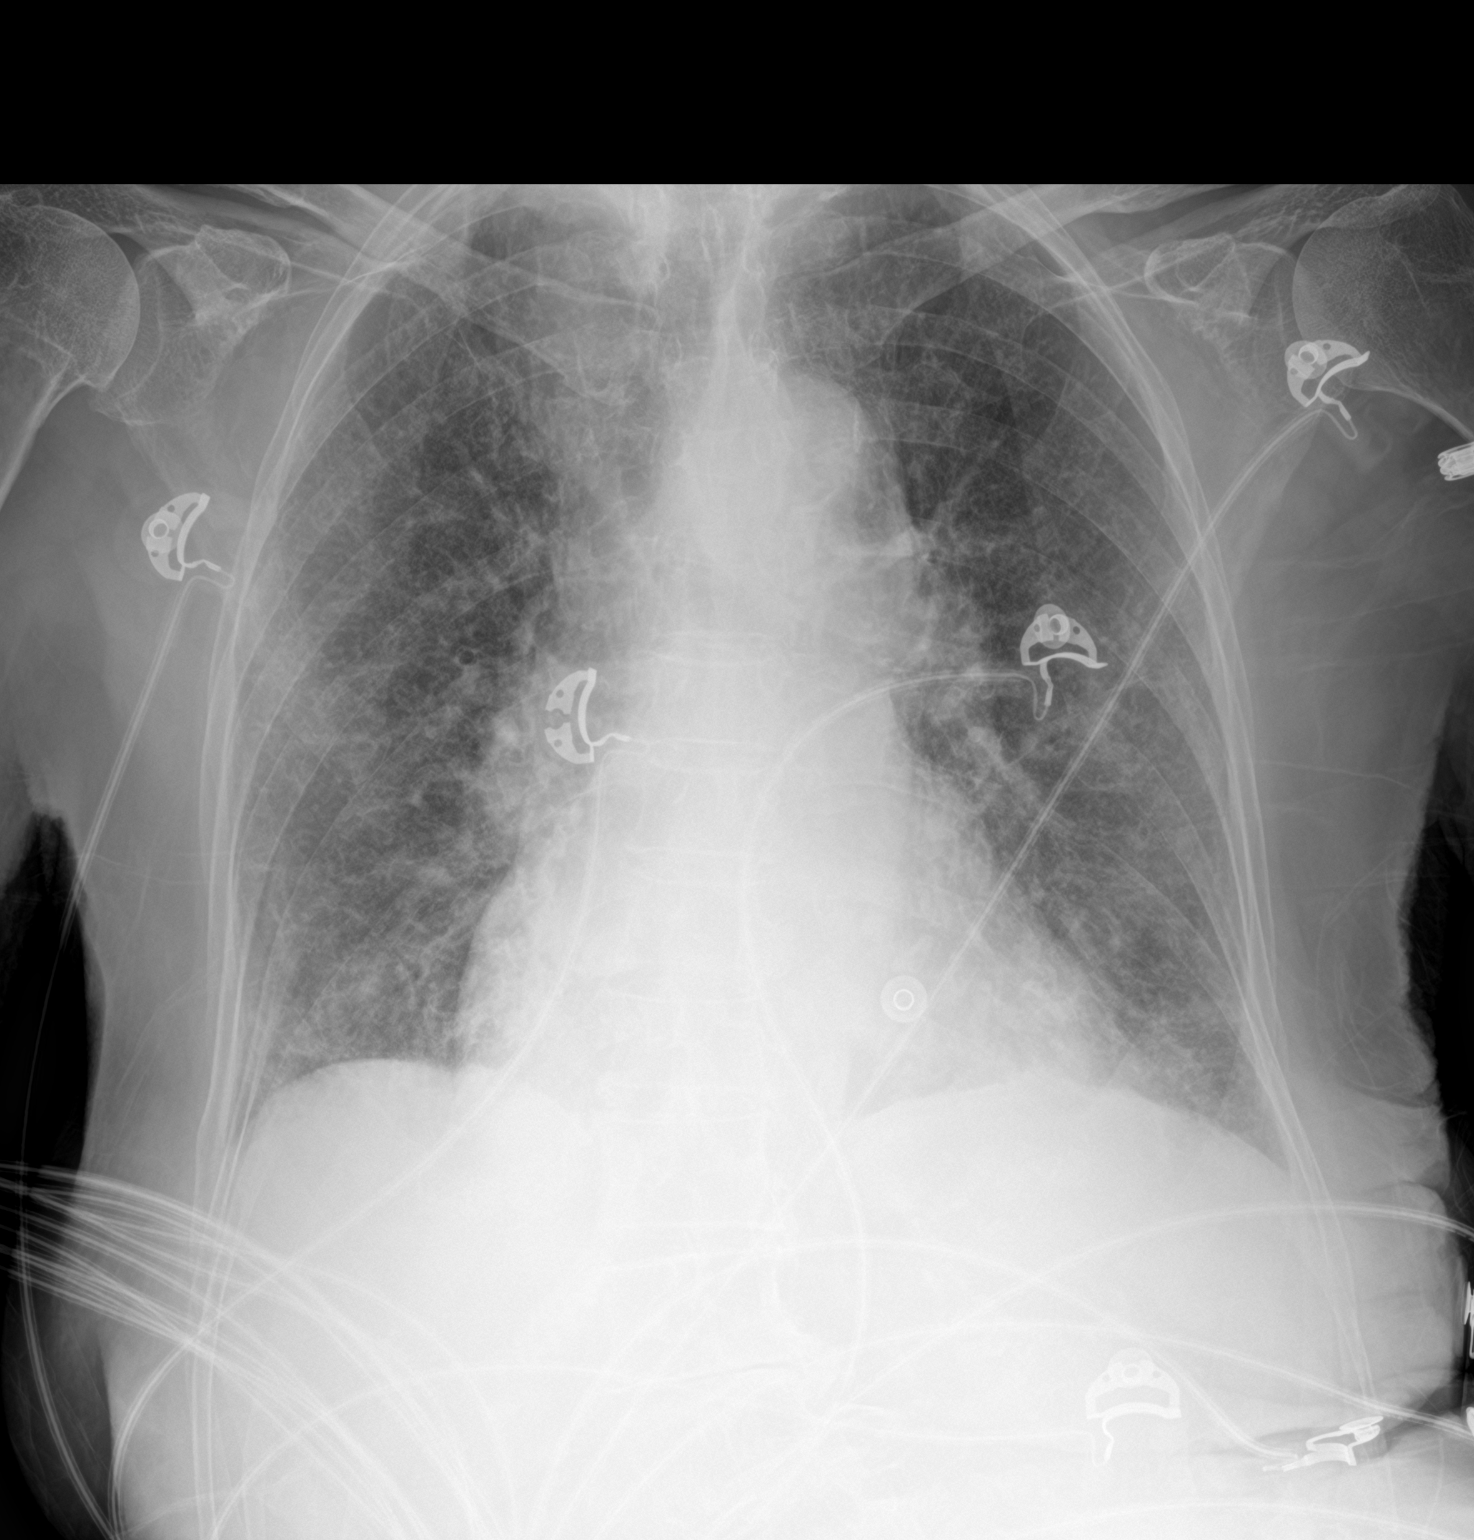

[2 of 2 positions shown; findings below may reference images not displayed]

FINDINGS: Mild cardiac enlargement. Stable mediastinal contours. Interstitial
coarsening without Kerley lines. No effusion or pneumothorax.
IMPRESSION: Cardiomegaly and vascular congestion.

## 2021-02-24 DEATH — deceased

## 2022-03-22 IMAGING — CT CT HEAD W/O CM
4 series · 17 of 47 positions shown, 19 images · non-contrast
Comparison: 07/18/2019

CLINICAL DATA: Fall, blood thinners

EXAM:
CT HEAD WITHOUT CONTRAST
TECHNIQUE: Contiguous axial images were obtained from the base of the skull
through the vertex without intravenous contrast.

[Series 3: head without · axial · non-contrast · 0.45mm/px · z∈[-186,-51]mm · 7 of 37 slices shown, 9 images]
[im 5/37  brain]
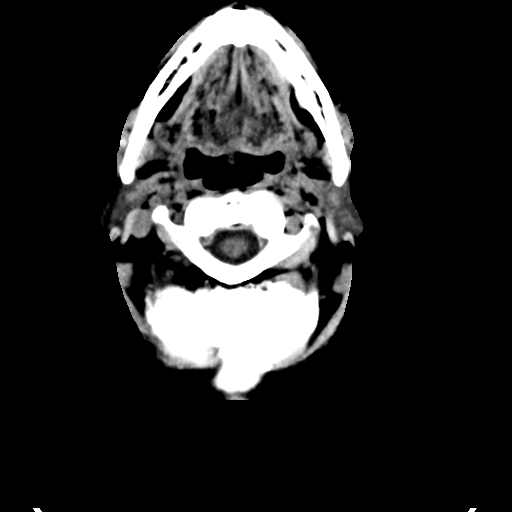
[im 5/37  bone]
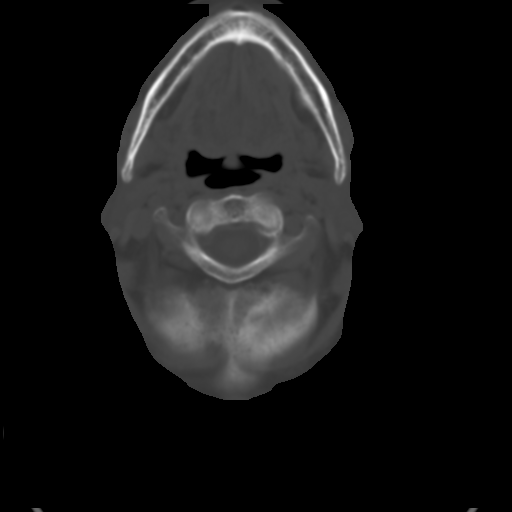
[im 10/37  brain]
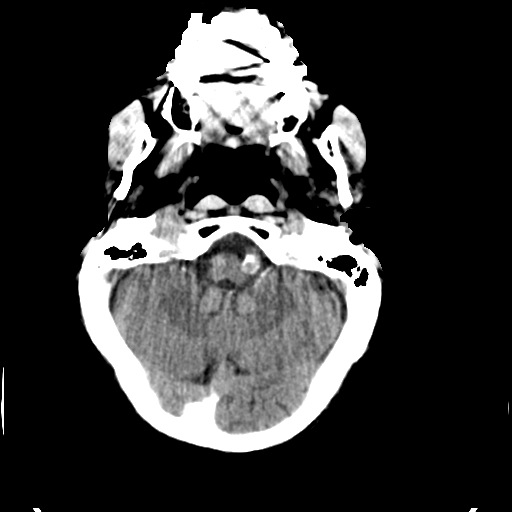
[im 14/37  brain]
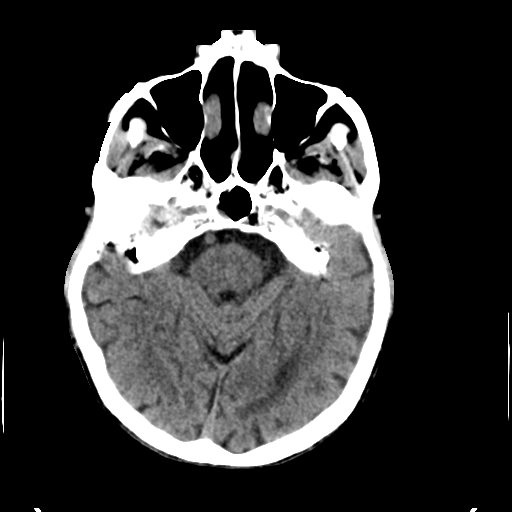
[im 19/37  brain]
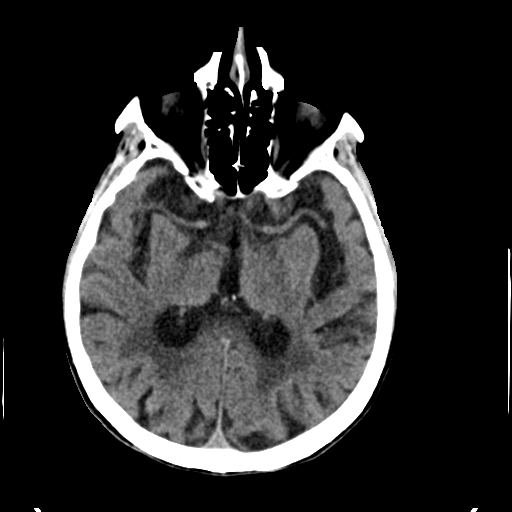
[im 23/37  brain]
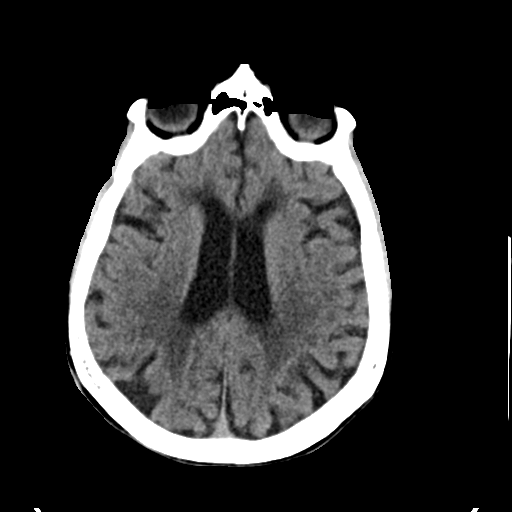
[im 23/37  bone]
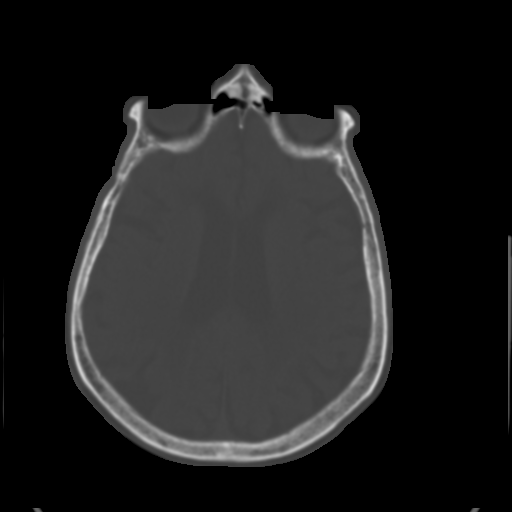
[im 28/37  brain]
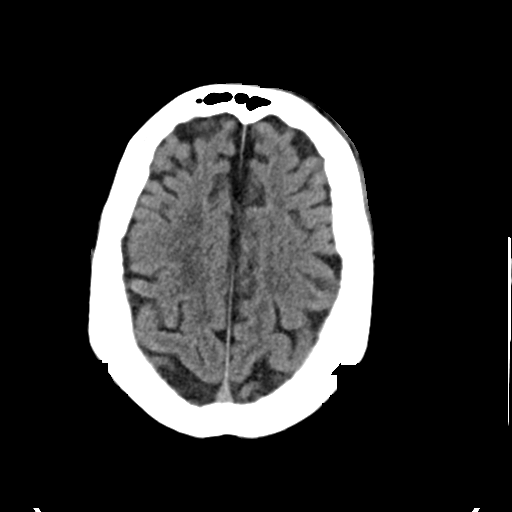
[im 32/37  brain]
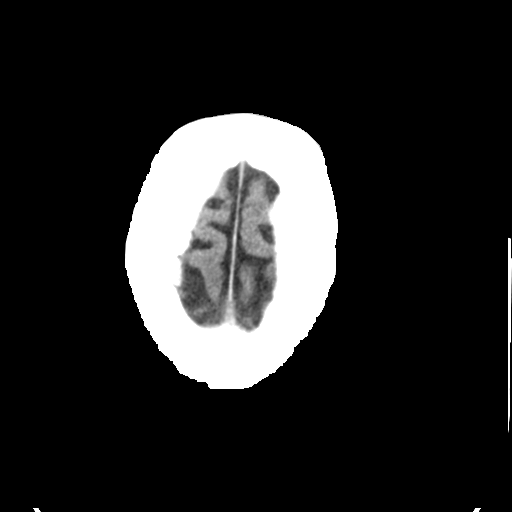

[Series 4: head bone · axial · 0.45mm/px · z∈[-188,-124]mm · 4 of 93 slices shown]
[im 10/93  bone]
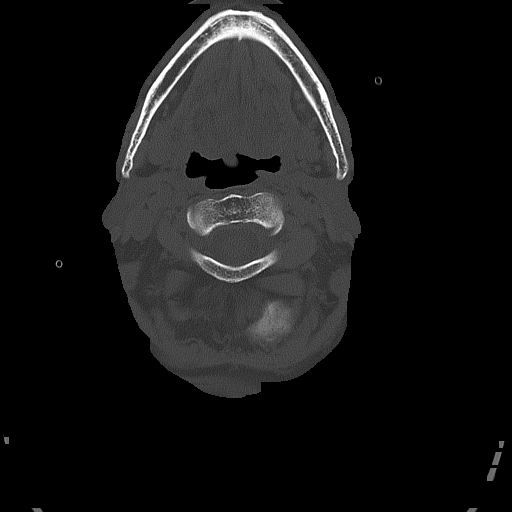
[im 19/93  bone]
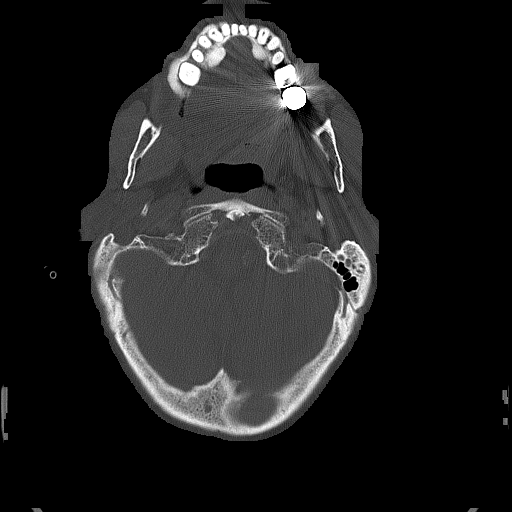
[im 28/93  bone]
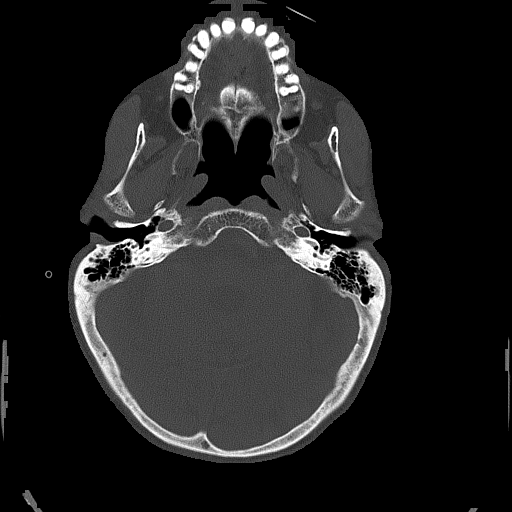
[im 42/93  bone]
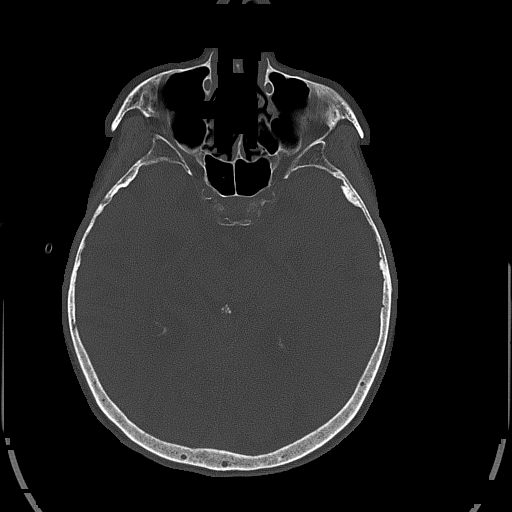

[Series 5: head without cor · coronal · non-contrast · 0.36mm/px · 3 of 70 slices shown]
[im 24/70  brain]
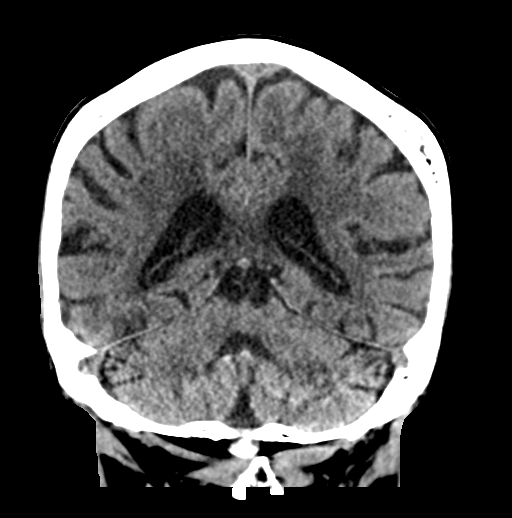
[im 31/70  brain]
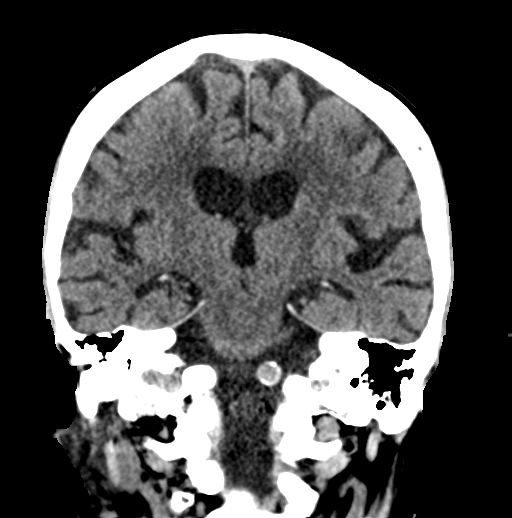
[im 39/70  brain]
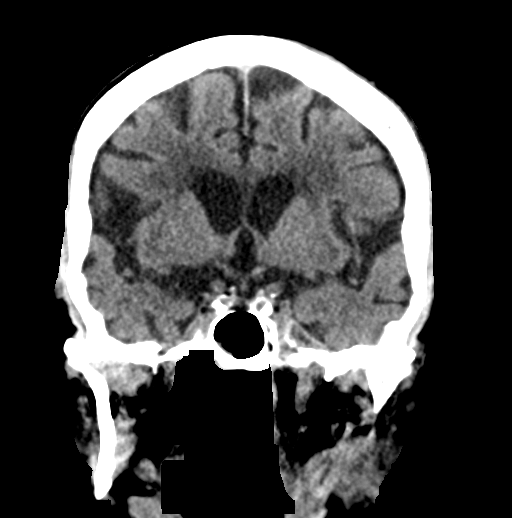

[Series 6: head without sag · sagittal · non-contrast · 0.38mm/px · 3 of 58 slices shown]
[im 20/58  brain]
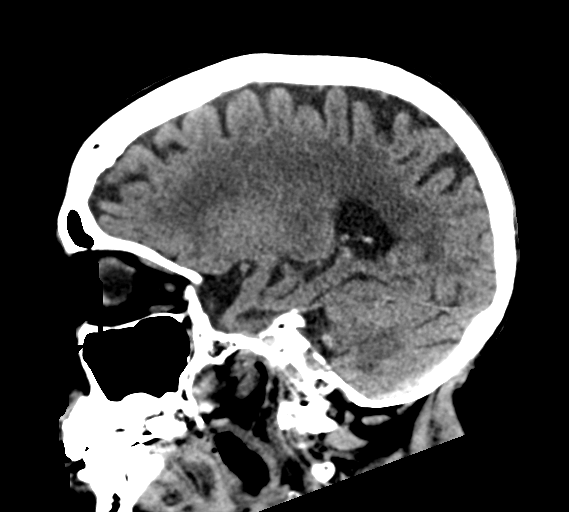
[im 29/58  brain]
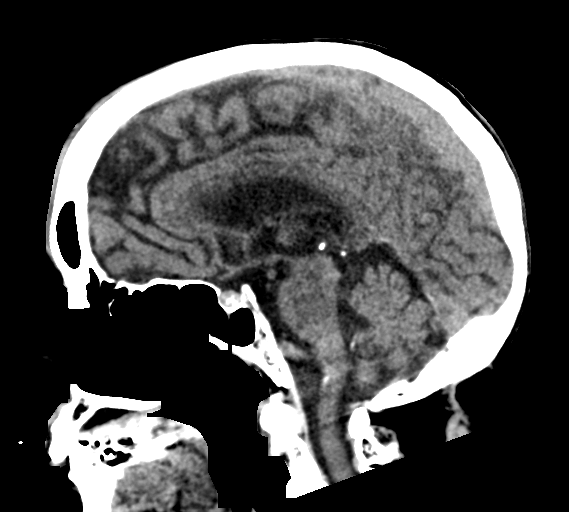
[im 39/58  brain]
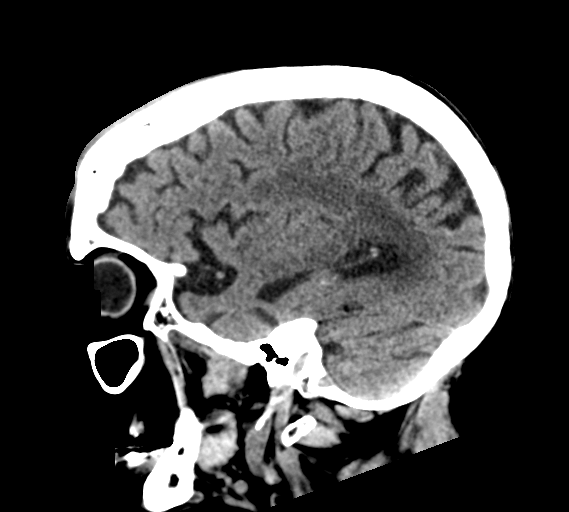

[17 of 47 positions shown; findings below may reference images not displayed]

FINDINGS: Brain: No evidence of acute infarction, hemorrhage, hydrocephalus,
extra-axial collection or mass lesion/mass effect. Extensive
periventricular and deep white matter hypodensity.

Vascular: No hyperdense vessel or unexpected calcification.

Skull: Normal. Negative for fracture or focal lesion.

Sinuses/Orbits: No acute finding.

Other: Soft tissue contusion of the left forehead.
IMPRESSION: 1. No acute intracranial pathology. Advanced small-vessel white
matter disease, in keeping with patient age.
2. Soft tissue contusion of the left forehead.
